# Patient Record
Sex: Female | Born: 1975 | Race: Black or African American | Hispanic: No | Smoking: Never smoker
Health system: Southern US, Community
[De-identification: ages and names within clinical notes are randomized; demographics above are authoritative.]

## PROBLEM LIST (undated history)

## (undated) ENCOUNTER — Emergency Department (HOSPITAL_BASED_OUTPATIENT_CLINIC_OR_DEPARTMENT_OTHER): Payer: No Typology Code available for payment source

## (undated) DIAGNOSIS — Z87898 Personal history of other specified conditions: Secondary | ICD-10-CM

## (undated) DIAGNOSIS — D649 Anemia, unspecified: Secondary | ICD-10-CM

## (undated) DIAGNOSIS — Q233 Congenital mitral insufficiency: Secondary | ICD-10-CM

## (undated) DIAGNOSIS — Z5189 Encounter for other specified aftercare: Secondary | ICD-10-CM

## (undated) DIAGNOSIS — J310 Chronic rhinitis: Secondary | ICD-10-CM

## (undated) DIAGNOSIS — F329 Major depressive disorder, single episode, unspecified: Secondary | ICD-10-CM

## (undated) DIAGNOSIS — R760 Raised antibody titer: Secondary | ICD-10-CM

## (undated) DIAGNOSIS — J42 Unspecified chronic bronchitis: Secondary | ICD-10-CM

## (undated) DIAGNOSIS — M20011 Mallet finger of right finger(s): Secondary | ICD-10-CM

## (undated) DIAGNOSIS — A599 Trichomoniasis, unspecified: Secondary | ICD-10-CM

## (undated) DIAGNOSIS — D573 Sickle-cell trait: Secondary | ICD-10-CM

## (undated) DIAGNOSIS — F32A Depression, unspecified: Secondary | ICD-10-CM

## (undated) DIAGNOSIS — I071 Rheumatic tricuspid insufficiency: Secondary | ICD-10-CM

## (undated) DIAGNOSIS — N920 Excessive and frequent menstruation with regular cycle: Secondary | ICD-10-CM

## (undated) DIAGNOSIS — R51 Headache: Secondary | ICD-10-CM

## (undated) DIAGNOSIS — D219 Benign neoplasm of connective and other soft tissue, unspecified: Secondary | ICD-10-CM

## (undated) DIAGNOSIS — D259 Leiomyoma of uterus, unspecified: Secondary | ICD-10-CM

## (undated) DIAGNOSIS — F419 Anxiety disorder, unspecified: Secondary | ICD-10-CM

## (undated) DIAGNOSIS — R053 Chronic cough: Secondary | ICD-10-CM

## (undated) DIAGNOSIS — M329 Systemic lupus erythematosus, unspecified: Secondary | ICD-10-CM

## (undated) DIAGNOSIS — R519 Headache, unspecified: Secondary | ICD-10-CM

## (undated) DIAGNOSIS — M47816 Spondylosis without myelopathy or radiculopathy, lumbar region: Secondary | ICD-10-CM

## (undated) DIAGNOSIS — K219 Gastro-esophageal reflux disease without esophagitis: Secondary | ICD-10-CM

## (undated) DIAGNOSIS — Z8619 Personal history of other infectious and parasitic diseases: Secondary | ICD-10-CM

## (undated) DIAGNOSIS — F5089 Other specified eating disorder: Secondary | ICD-10-CM

## (undated) DIAGNOSIS — K760 Fatty (change of) liver, not elsewhere classified: Secondary | ICD-10-CM

## (undated) DIAGNOSIS — D6862 Lupus anticoagulant syndrome: Secondary | ICD-10-CM

## (undated) DIAGNOSIS — G43109 Migraine with aura, not intractable, without status migrainosus: Secondary | ICD-10-CM

## (undated) DIAGNOSIS — K5909 Other constipation: Secondary | ICD-10-CM

## (undated) DIAGNOSIS — I340 Nonrheumatic mitral (valve) insufficiency: Secondary | ICD-10-CM

## (undated) HISTORY — PX: DILATION AND CURETTAGE OF UTERUS: SHX78

## (undated) HISTORY — DX: Fatty (change of) liver, not elsewhere classified: K76.0

## (undated) HISTORY — DX: Trichomoniasis, unspecified: A59.9

## (undated) HISTORY — DX: Spondylosis without myelopathy or radiculopathy, lumbar region: M47.816

---

## 1997-09-05 ENCOUNTER — Emergency Department (HOSPITAL_COMMUNITY): Admission: EM | Admit: 1997-09-05 | Discharge: 1997-09-05 | Payer: Self-pay | Admitting: Emergency Medicine

## 1997-10-09 ENCOUNTER — Other Ambulatory Visit: Admission: RE | Admit: 1997-10-09 | Discharge: 1997-10-09 | Payer: Self-pay | Admitting: Obstetrics and Gynecology

## 1997-12-13 ENCOUNTER — Other Ambulatory Visit: Admission: RE | Admit: 1997-12-13 | Discharge: 1997-12-13 | Payer: Self-pay | Admitting: Obstetrics and Gynecology

## 1997-12-22 ENCOUNTER — Inpatient Hospital Stay (HOSPITAL_COMMUNITY): Admission: AD | Admit: 1997-12-22 | Discharge: 1997-12-22 | Payer: Self-pay | Admitting: Obstetrics and Gynecology

## 1998-02-03 ENCOUNTER — Other Ambulatory Visit: Admission: RE | Admit: 1998-02-03 | Discharge: 1998-02-03 | Payer: Self-pay | Admitting: Obstetrics & Gynecology

## 1998-04-03 ENCOUNTER — Other Ambulatory Visit: Admission: RE | Admit: 1998-04-03 | Discharge: 1998-04-03 | Payer: Self-pay | Admitting: Obstetrics and Gynecology

## 1998-05-01 ENCOUNTER — Inpatient Hospital Stay (HOSPITAL_COMMUNITY): Admission: AD | Admit: 1998-05-01 | Discharge: 1998-05-01 | Payer: Self-pay | Admitting: Obstetrics & Gynecology

## 1998-05-14 ENCOUNTER — Inpatient Hospital Stay (HOSPITAL_COMMUNITY): Admission: AD | Admit: 1998-05-14 | Discharge: 1998-05-17 | Payer: Self-pay | Admitting: Obstetrics and Gynecology

## 1998-07-11 ENCOUNTER — Other Ambulatory Visit: Admission: RE | Admit: 1998-07-11 | Discharge: 1998-07-11 | Payer: Self-pay | Admitting: Obstetrics & Gynecology

## 1998-09-15 ENCOUNTER — Emergency Department (HOSPITAL_COMMUNITY): Admission: EM | Admit: 1998-09-15 | Discharge: 1998-09-15 | Payer: Self-pay

## 1998-10-27 ENCOUNTER — Other Ambulatory Visit: Admission: RE | Admit: 1998-10-27 | Discharge: 1998-10-27 | Payer: Self-pay | Admitting: *Deleted

## 1998-12-06 ENCOUNTER — Inpatient Hospital Stay (HOSPITAL_COMMUNITY): Admission: AD | Admit: 1998-12-06 | Discharge: 1998-12-06 | Payer: Self-pay | Admitting: Obstetrics

## 1998-12-19 ENCOUNTER — Encounter: Payer: Self-pay | Admitting: *Deleted

## 1998-12-19 ENCOUNTER — Inpatient Hospital Stay (HOSPITAL_COMMUNITY): Admission: AD | Admit: 1998-12-19 | Discharge: 1998-12-19 | Payer: Self-pay | Admitting: Obstetrics & Gynecology

## 1998-12-21 ENCOUNTER — Inpatient Hospital Stay (HOSPITAL_COMMUNITY): Admission: AD | Admit: 1998-12-21 | Discharge: 1998-12-21 | Payer: Self-pay | Admitting: Obstetrics

## 1999-01-12 ENCOUNTER — Inpatient Hospital Stay (HOSPITAL_COMMUNITY): Admission: AD | Admit: 1999-01-12 | Discharge: 1999-01-12 | Payer: Self-pay | Admitting: Obstetrics

## 1999-03-13 ENCOUNTER — Ambulatory Visit (HOSPITAL_COMMUNITY): Admission: RE | Admit: 1999-03-13 | Discharge: 1999-03-13 | Payer: Self-pay | Admitting: Obstetrics

## 1999-03-13 ENCOUNTER — Encounter: Payer: Self-pay | Admitting: *Deleted

## 1999-03-22 ENCOUNTER — Inpatient Hospital Stay (HOSPITAL_COMMUNITY): Admission: AD | Admit: 1999-03-22 | Discharge: 1999-03-22 | Payer: Self-pay | Admitting: *Deleted

## 1999-04-10 ENCOUNTER — Inpatient Hospital Stay (HOSPITAL_COMMUNITY): Admission: AD | Admit: 1999-04-10 | Discharge: 1999-04-10 | Payer: Self-pay | Admitting: *Deleted

## 1999-04-11 ENCOUNTER — Encounter: Payer: Self-pay | Admitting: *Deleted

## 1999-04-13 ENCOUNTER — Inpatient Hospital Stay (HOSPITAL_COMMUNITY): Admission: AD | Admit: 1999-04-13 | Discharge: 1999-04-13 | Payer: Self-pay | Admitting: *Deleted

## 1999-04-14 ENCOUNTER — Encounter: Payer: Self-pay | Admitting: *Deleted

## 1999-04-14 ENCOUNTER — Inpatient Hospital Stay (HOSPITAL_COMMUNITY): Admission: AD | Admit: 1999-04-14 | Discharge: 1999-04-15 | Payer: Self-pay | Admitting: *Deleted

## 1999-09-08 ENCOUNTER — Inpatient Hospital Stay (HOSPITAL_COMMUNITY): Admission: AD | Admit: 1999-09-08 | Discharge: 1999-09-08 | Payer: Self-pay | Admitting: *Deleted

## 1999-11-02 ENCOUNTER — Inpatient Hospital Stay (HOSPITAL_COMMUNITY): Admission: AD | Admit: 1999-11-02 | Discharge: 1999-11-02 | Payer: Self-pay | Admitting: Obstetrics

## 1999-11-16 ENCOUNTER — Emergency Department (HOSPITAL_COMMUNITY): Admission: EM | Admit: 1999-11-16 | Discharge: 1999-11-16 | Payer: Self-pay | Admitting: Internal Medicine

## 2000-06-01 ENCOUNTER — Emergency Department (HOSPITAL_COMMUNITY): Admission: EM | Admit: 2000-06-01 | Discharge: 2000-06-01 | Payer: Self-pay | Admitting: Emergency Medicine

## 2000-06-02 ENCOUNTER — Emergency Department (HOSPITAL_COMMUNITY): Admission: EM | Admit: 2000-06-02 | Discharge: 2000-06-02 | Payer: Self-pay | Admitting: Emergency Medicine

## 2000-08-10 ENCOUNTER — Emergency Department (HOSPITAL_COMMUNITY): Admission: EM | Admit: 2000-08-10 | Discharge: 2000-08-10 | Payer: Self-pay | Admitting: Emergency Medicine

## 2000-09-13 ENCOUNTER — Other Ambulatory Visit: Admission: RE | Admit: 2000-09-13 | Discharge: 2000-09-13 | Payer: Self-pay | Admitting: Obstetrics and Gynecology

## 2000-10-30 ENCOUNTER — Inpatient Hospital Stay (HOSPITAL_COMMUNITY): Admission: AD | Admit: 2000-10-30 | Discharge: 2000-10-30 | Payer: Self-pay | Admitting: Obstetrics and Gynecology

## 2000-11-11 ENCOUNTER — Inpatient Hospital Stay (HOSPITAL_COMMUNITY): Admission: AD | Admit: 2000-11-11 | Discharge: 2000-11-11 | Payer: Self-pay | Admitting: Obstetrics and Gynecology

## 2000-11-28 ENCOUNTER — Encounter: Payer: Self-pay | Admitting: Obstetrics and Gynecology

## 2000-11-28 ENCOUNTER — Ambulatory Visit (HOSPITAL_COMMUNITY): Admission: RE | Admit: 2000-11-28 | Discharge: 2000-11-28 | Payer: Self-pay | Admitting: Obstetrics and Gynecology

## 2001-02-01 ENCOUNTER — Encounter: Payer: Self-pay | Admitting: Obstetrics and Gynecology

## 2001-02-01 ENCOUNTER — Ambulatory Visit (HOSPITAL_COMMUNITY): Admission: RE | Admit: 2001-02-01 | Discharge: 2001-02-01 | Payer: Self-pay | Admitting: Obstetrics and Gynecology

## 2001-02-28 ENCOUNTER — Inpatient Hospital Stay (HOSPITAL_COMMUNITY): Admission: AD | Admit: 2001-02-28 | Discharge: 2001-02-28 | Payer: Self-pay | Admitting: Obstetrics and Gynecology

## 2001-03-07 ENCOUNTER — Ambulatory Visit (HOSPITAL_COMMUNITY): Admission: RE | Admit: 2001-03-07 | Discharge: 2001-03-07 | Payer: Self-pay | Admitting: Obstetrics and Gynecology

## 2001-03-07 ENCOUNTER — Encounter: Payer: Self-pay | Admitting: Obstetrics and Gynecology

## 2001-03-16 ENCOUNTER — Encounter: Admission: RE | Admit: 2001-03-16 | Discharge: 2001-03-16 | Payer: Self-pay | Admitting: Obstetrics and Gynecology

## 2001-03-21 ENCOUNTER — Inpatient Hospital Stay (HOSPITAL_COMMUNITY): Admission: AD | Admit: 2001-03-21 | Discharge: 2001-03-21 | Payer: Self-pay | Admitting: Obstetrics and Gynecology

## 2001-03-21 ENCOUNTER — Encounter: Payer: Self-pay | Admitting: Obstetrics and Gynecology

## 2001-04-09 ENCOUNTER — Inpatient Hospital Stay (HOSPITAL_COMMUNITY): Admission: AD | Admit: 2001-04-09 | Discharge: 2001-04-09 | Payer: Self-pay | Admitting: Obstetrics and Gynecology

## 2001-04-25 ENCOUNTER — Inpatient Hospital Stay (HOSPITAL_COMMUNITY): Admission: AD | Admit: 2001-04-25 | Discharge: 2001-04-28 | Payer: Self-pay | Admitting: Obstetrics and Gynecology

## 2002-01-12 ENCOUNTER — Inpatient Hospital Stay (HOSPITAL_COMMUNITY): Admission: AD | Admit: 2002-01-12 | Discharge: 2002-01-12 | Payer: Self-pay | Admitting: Obstetrics and Gynecology

## 2002-01-12 ENCOUNTER — Encounter: Payer: Self-pay | Admitting: Obstetrics and Gynecology

## 2002-01-25 ENCOUNTER — Other Ambulatory Visit: Admission: RE | Admit: 2002-01-25 | Discharge: 2002-01-25 | Payer: Self-pay | Admitting: Obstetrics and Gynecology

## 2002-03-08 ENCOUNTER — Ambulatory Visit (HOSPITAL_COMMUNITY): Admission: RE | Admit: 2002-03-08 | Discharge: 2002-03-08 | Payer: Self-pay | Admitting: Obstetrics and Gynecology

## 2002-03-08 ENCOUNTER — Encounter: Payer: Self-pay | Admitting: Obstetrics and Gynecology

## 2002-03-09 ENCOUNTER — Encounter: Payer: Self-pay | Admitting: Obstetrics and Gynecology

## 2002-03-09 ENCOUNTER — Ambulatory Visit (HOSPITAL_COMMUNITY): Admission: RE | Admit: 2002-03-09 | Discharge: 2002-03-09 | Payer: Self-pay | Admitting: Obstetrics and Gynecology

## 2002-03-10 ENCOUNTER — Encounter (INDEPENDENT_AMBULATORY_CARE_PROVIDER_SITE_OTHER): Payer: Self-pay | Admitting: Specialist

## 2003-04-26 ENCOUNTER — Emergency Department (HOSPITAL_COMMUNITY): Admission: EM | Admit: 2003-04-26 | Discharge: 2003-04-26 | Payer: Self-pay | Admitting: Emergency Medicine

## 2003-08-17 ENCOUNTER — Emergency Department (HOSPITAL_COMMUNITY): Admission: EM | Admit: 2003-08-17 | Discharge: 2003-08-17 | Payer: Self-pay | Admitting: Emergency Medicine

## 2003-09-17 ENCOUNTER — Inpatient Hospital Stay (HOSPITAL_COMMUNITY): Admission: AD | Admit: 2003-09-17 | Discharge: 2003-09-17 | Payer: Self-pay | Admitting: *Deleted

## 2003-10-01 ENCOUNTER — Ambulatory Visit (HOSPITAL_COMMUNITY): Admission: RE | Admit: 2003-10-01 | Discharge: 2003-10-01 | Payer: Self-pay | Admitting: *Deleted

## 2003-10-23 ENCOUNTER — Inpatient Hospital Stay (HOSPITAL_COMMUNITY): Admission: AD | Admit: 2003-10-23 | Discharge: 2003-10-23 | Payer: Self-pay | Admitting: Obstetrics and Gynecology

## 2003-12-08 ENCOUNTER — Inpatient Hospital Stay (HOSPITAL_COMMUNITY): Admission: AD | Admit: 2003-12-08 | Discharge: 2003-12-08 | Payer: Self-pay | Admitting: Gynecology

## 2003-12-19 ENCOUNTER — Inpatient Hospital Stay (HOSPITAL_COMMUNITY): Admission: AD | Admit: 2003-12-19 | Discharge: 2003-12-20 | Payer: Self-pay | Admitting: Obstetrics

## 2004-03-09 ENCOUNTER — Inpatient Hospital Stay (HOSPITAL_COMMUNITY): Admission: AD | Admit: 2004-03-09 | Discharge: 2004-03-09 | Payer: Self-pay | Admitting: Obstetrics

## 2004-03-12 ENCOUNTER — Ambulatory Visit (HOSPITAL_COMMUNITY): Admission: RE | Admit: 2004-03-12 | Discharge: 2004-03-12 | Payer: Self-pay | Admitting: Family Medicine

## 2004-03-22 ENCOUNTER — Inpatient Hospital Stay (HOSPITAL_COMMUNITY): Admission: AD | Admit: 2004-03-22 | Discharge: 2004-03-22 | Payer: Self-pay | Admitting: Obstetrics

## 2004-05-25 ENCOUNTER — Ambulatory Visit (HOSPITAL_COMMUNITY): Admission: RE | Admit: 2004-05-25 | Discharge: 2004-05-25 | Payer: Self-pay | Admitting: Obstetrics & Gynecology

## 2004-05-28 ENCOUNTER — Inpatient Hospital Stay (HOSPITAL_COMMUNITY): Admission: AD | Admit: 2004-05-28 | Discharge: 2004-05-31 | Payer: Self-pay | Admitting: Obstetrics

## 2004-07-09 ENCOUNTER — Encounter (INDEPENDENT_AMBULATORY_CARE_PROVIDER_SITE_OTHER): Payer: Self-pay | Admitting: *Deleted

## 2004-07-09 ENCOUNTER — Observation Stay (HOSPITAL_COMMUNITY): Admission: AD | Admit: 2004-07-09 | Discharge: 2004-07-10 | Payer: Self-pay | Admitting: Obstetrics & Gynecology

## 2004-12-15 ENCOUNTER — Emergency Department (HOSPITAL_COMMUNITY): Admission: EM | Admit: 2004-12-15 | Discharge: 2004-12-15 | Payer: Self-pay | Admitting: Emergency Medicine

## 2005-04-28 ENCOUNTER — Inpatient Hospital Stay (HOSPITAL_COMMUNITY): Admission: AD | Admit: 2005-04-28 | Discharge: 2005-04-28 | Payer: Self-pay | Admitting: Obstetrics and Gynecology

## 2005-05-03 ENCOUNTER — Inpatient Hospital Stay (HOSPITAL_COMMUNITY): Admission: AD | Admit: 2005-05-03 | Discharge: 2005-05-03 | Payer: Self-pay | Admitting: Obstetrics and Gynecology

## 2005-05-13 ENCOUNTER — Inpatient Hospital Stay (HOSPITAL_COMMUNITY): Admission: AD | Admit: 2005-05-13 | Discharge: 2005-05-13 | Payer: Self-pay | Admitting: Obstetrics and Gynecology

## 2005-05-17 ENCOUNTER — Encounter (INDEPENDENT_AMBULATORY_CARE_PROVIDER_SITE_OTHER): Payer: Self-pay | Admitting: *Deleted

## 2005-05-17 ENCOUNTER — Ambulatory Visit: Admission: AD | Admit: 2005-05-17 | Discharge: 2005-05-17 | Payer: Self-pay | Admitting: Family Medicine

## 2005-06-07 ENCOUNTER — Emergency Department (HOSPITAL_COMMUNITY): Admission: EM | Admit: 2005-06-07 | Discharge: 2005-06-07 | Payer: Self-pay | Admitting: Emergency Medicine

## 2005-06-15 ENCOUNTER — Emergency Department (HOSPITAL_COMMUNITY): Admission: EM | Admit: 2005-06-15 | Discharge: 2005-06-15 | Payer: Self-pay | Admitting: *Deleted

## 2005-10-21 ENCOUNTER — Inpatient Hospital Stay (HOSPITAL_COMMUNITY): Admission: AD | Admit: 2005-10-21 | Discharge: 2005-10-21 | Payer: Self-pay | Admitting: Family Medicine

## 2005-11-29 ENCOUNTER — Ambulatory Visit: Payer: Self-pay | Admitting: Obstetrics & Gynecology

## 2005-11-29 ENCOUNTER — Inpatient Hospital Stay (HOSPITAL_COMMUNITY): Admission: AD | Admit: 2005-11-29 | Discharge: 2005-11-29 | Payer: Self-pay | Admitting: Obstetrics & Gynecology

## 2005-12-16 ENCOUNTER — Inpatient Hospital Stay (HOSPITAL_COMMUNITY): Admission: AD | Admit: 2005-12-16 | Discharge: 2005-12-17 | Payer: Self-pay | Admitting: Family Medicine

## 2005-12-16 ENCOUNTER — Ambulatory Visit: Payer: Self-pay | Admitting: Certified Nurse Midwife

## 2005-12-20 ENCOUNTER — Ambulatory Visit: Payer: Self-pay | Admitting: *Deleted

## 2005-12-20 ENCOUNTER — Encounter (INDEPENDENT_AMBULATORY_CARE_PROVIDER_SITE_OTHER): Payer: Self-pay | Admitting: *Deleted

## 2005-12-24 ENCOUNTER — Ambulatory Visit (HOSPITAL_COMMUNITY): Admission: RE | Admit: 2005-12-24 | Discharge: 2005-12-24 | Payer: Self-pay | Admitting: Family Medicine

## 2005-12-24 ENCOUNTER — Encounter (INDEPENDENT_AMBULATORY_CARE_PROVIDER_SITE_OTHER): Payer: Self-pay | Admitting: *Deleted

## 2006-01-02 ENCOUNTER — Emergency Department (HOSPITAL_COMMUNITY): Admission: EM | Admit: 2006-01-02 | Discharge: 2006-01-02 | Payer: Self-pay | Admitting: Emergency Medicine

## 2006-01-06 ENCOUNTER — Ambulatory Visit: Payer: Self-pay | Admitting: Family Medicine

## 2006-01-20 ENCOUNTER — Ambulatory Visit: Payer: Self-pay | Admitting: Family Medicine

## 2006-01-24 ENCOUNTER — Ambulatory Visit: Payer: Self-pay | Admitting: Obstetrics & Gynecology

## 2006-01-27 ENCOUNTER — Ambulatory Visit: Payer: Self-pay | Admitting: Family Medicine

## 2006-02-07 ENCOUNTER — Ambulatory Visit: Payer: Self-pay | Admitting: Obstetrics & Gynecology

## 2006-02-10 ENCOUNTER — Ambulatory Visit: Payer: Self-pay | Admitting: Family Medicine

## 2006-02-14 ENCOUNTER — Ambulatory Visit: Payer: Self-pay | Admitting: Obstetrics & Gynecology

## 2006-02-16 ENCOUNTER — Ambulatory Visit: Payer: Self-pay | Admitting: Obstetrics and Gynecology

## 2006-02-16 ENCOUNTER — Inpatient Hospital Stay (HOSPITAL_COMMUNITY): Admission: AD | Admit: 2006-02-16 | Discharge: 2006-02-19 | Payer: Self-pay | Admitting: Gynecology

## 2006-03-21 ENCOUNTER — Inpatient Hospital Stay (HOSPITAL_COMMUNITY): Admission: AD | Admit: 2006-03-21 | Discharge: 2006-03-22 | Payer: Self-pay | Admitting: Obstetrics & Gynecology

## 2006-03-21 ENCOUNTER — Ambulatory Visit: Payer: Self-pay | Admitting: Obstetrics and Gynecology

## 2006-05-13 ENCOUNTER — Inpatient Hospital Stay (HOSPITAL_COMMUNITY): Admission: AD | Admit: 2006-05-13 | Discharge: 2006-05-13 | Payer: Self-pay | Admitting: Family Medicine

## 2006-10-26 ENCOUNTER — Emergency Department (HOSPITAL_COMMUNITY): Admission: EM | Admit: 2006-10-26 | Discharge: 2006-10-26 | Payer: Self-pay | Admitting: Emergency Medicine

## 2007-02-11 ENCOUNTER — Inpatient Hospital Stay (HOSPITAL_COMMUNITY): Admission: AD | Admit: 2007-02-11 | Discharge: 2007-02-11 | Payer: Self-pay | Admitting: Obstetrics and Gynecology

## 2007-02-15 ENCOUNTER — Inpatient Hospital Stay (HOSPITAL_COMMUNITY): Admission: AD | Admit: 2007-02-15 | Discharge: 2007-02-15 | Payer: Self-pay | Admitting: Family Medicine

## 2007-03-11 ENCOUNTER — Inpatient Hospital Stay (HOSPITAL_COMMUNITY): Admission: AD | Admit: 2007-03-11 | Discharge: 2007-03-11 | Payer: Self-pay | Admitting: Gynecology

## 2007-03-29 ENCOUNTER — Inpatient Hospital Stay (HOSPITAL_COMMUNITY): Admission: AD | Admit: 2007-03-29 | Discharge: 2007-03-29 | Payer: Self-pay | Admitting: Obstetrics & Gynecology

## 2007-04-12 ENCOUNTER — Ambulatory Visit: Payer: Self-pay | Admitting: *Deleted

## 2007-04-12 ENCOUNTER — Encounter (INDEPENDENT_AMBULATORY_CARE_PROVIDER_SITE_OTHER): Payer: Self-pay | Admitting: *Deleted

## 2007-04-25 ENCOUNTER — Inpatient Hospital Stay (HOSPITAL_COMMUNITY): Admission: AD | Admit: 2007-04-25 | Discharge: 2007-04-25 | Payer: Self-pay | Admitting: Obstetrics & Gynecology

## 2007-04-26 ENCOUNTER — Ambulatory Visit (HOSPITAL_COMMUNITY): Admission: RE | Admit: 2007-04-26 | Discharge: 2007-04-26 | Payer: Self-pay | Admitting: Obstetrics & Gynecology

## 2007-04-30 ENCOUNTER — Inpatient Hospital Stay (HOSPITAL_COMMUNITY): Admission: AD | Admit: 2007-04-30 | Discharge: 2007-04-30 | Payer: Self-pay | Admitting: Gynecology

## 2007-05-04 ENCOUNTER — Ambulatory Visit: Payer: Self-pay | Admitting: Obstetrics & Gynecology

## 2007-05-05 ENCOUNTER — Inpatient Hospital Stay (HOSPITAL_COMMUNITY): Admission: AD | Admit: 2007-05-05 | Discharge: 2007-05-06 | Payer: Self-pay | Admitting: Gynecology

## 2007-05-07 ENCOUNTER — Ambulatory Visit: Payer: Self-pay | Admitting: Obstetrics and Gynecology

## 2007-05-07 ENCOUNTER — Inpatient Hospital Stay (HOSPITAL_COMMUNITY): Admission: AD | Admit: 2007-05-07 | Discharge: 2007-05-07 | Payer: Self-pay | Admitting: Gynecology

## 2007-05-08 ENCOUNTER — Ambulatory Visit (HOSPITAL_COMMUNITY): Admission: RE | Admit: 2007-05-08 | Discharge: 2007-05-08 | Payer: Self-pay | Admitting: Obstetrics & Gynecology

## 2007-05-15 ENCOUNTER — Inpatient Hospital Stay (HOSPITAL_COMMUNITY): Admission: AD | Admit: 2007-05-15 | Discharge: 2007-05-16 | Payer: Self-pay | Admitting: Obstetrics and Gynecology

## 2007-05-18 ENCOUNTER — Ambulatory Visit: Payer: Self-pay | Admitting: Family Medicine

## 2007-05-22 ENCOUNTER — Ambulatory Visit (HOSPITAL_COMMUNITY): Admission: RE | Admit: 2007-05-22 | Discharge: 2007-05-22 | Payer: Self-pay | Admitting: Obstetrics & Gynecology

## 2007-06-01 ENCOUNTER — Ambulatory Visit: Payer: Self-pay | Admitting: Family Medicine

## 2007-06-15 ENCOUNTER — Other Ambulatory Visit: Payer: Self-pay | Admitting: Family Medicine

## 2007-06-15 ENCOUNTER — Ambulatory Visit: Payer: Self-pay | Admitting: Family Medicine

## 2007-06-20 ENCOUNTER — Ambulatory Visit (HOSPITAL_COMMUNITY): Admission: RE | Admit: 2007-06-20 | Discharge: 2007-06-20 | Payer: Self-pay | Admitting: Obstetrics & Gynecology

## 2007-06-22 ENCOUNTER — Inpatient Hospital Stay (HOSPITAL_COMMUNITY): Admission: AD | Admit: 2007-06-22 | Discharge: 2007-06-22 | Payer: Self-pay | Admitting: Family Medicine

## 2007-06-22 ENCOUNTER — Ambulatory Visit: Payer: Self-pay | Admitting: Advanced Practice Midwife

## 2007-07-06 ENCOUNTER — Ambulatory Visit: Payer: Self-pay | Admitting: Obstetrics & Gynecology

## 2007-07-18 ENCOUNTER — Ambulatory Visit (HOSPITAL_COMMUNITY): Admission: RE | Admit: 2007-07-18 | Discharge: 2007-07-18 | Payer: Self-pay | Admitting: Obstetrics & Gynecology

## 2007-07-20 ENCOUNTER — Ambulatory Visit: Payer: Self-pay | Admitting: Family Medicine

## 2007-08-03 ENCOUNTER — Ambulatory Visit: Payer: Self-pay | Admitting: Family Medicine

## 2007-08-08 ENCOUNTER — Ambulatory Visit: Payer: Self-pay | Admitting: Obstetrics and Gynecology

## 2007-08-08 ENCOUNTER — Observation Stay (HOSPITAL_COMMUNITY): Admission: AD | Admit: 2007-08-08 | Discharge: 2007-08-09 | Payer: Self-pay | Admitting: Obstetrics & Gynecology

## 2007-08-10 ENCOUNTER — Ambulatory Visit: Payer: Self-pay | Admitting: Obstetrics & Gynecology

## 2007-08-14 ENCOUNTER — Ambulatory Visit: Payer: Self-pay | Admitting: Obstetrics & Gynecology

## 2007-08-15 ENCOUNTER — Ambulatory Visit (HOSPITAL_COMMUNITY): Admission: RE | Admit: 2007-08-15 | Discharge: 2007-08-15 | Payer: Self-pay | Admitting: Obstetrics & Gynecology

## 2007-08-17 ENCOUNTER — Ambulatory Visit: Payer: Self-pay | Admitting: Obstetrics & Gynecology

## 2007-08-17 ENCOUNTER — Inpatient Hospital Stay (HOSPITAL_COMMUNITY): Admission: RE | Admit: 2007-08-17 | Discharge: 2007-08-21 | Payer: Self-pay | Admitting: Family Medicine

## 2007-08-17 ENCOUNTER — Ambulatory Visit (HOSPITAL_COMMUNITY): Admission: RE | Admit: 2007-08-17 | Discharge: 2007-08-17 | Payer: Self-pay | Admitting: Obstetrics & Gynecology

## 2007-08-17 ENCOUNTER — Encounter: Payer: Self-pay | Admitting: Obstetrics & Gynecology

## 2007-08-25 ENCOUNTER — Ambulatory Visit: Payer: Self-pay | Admitting: Family Medicine

## 2007-08-25 ENCOUNTER — Telehealth (INDEPENDENT_AMBULATORY_CARE_PROVIDER_SITE_OTHER): Payer: Self-pay | Admitting: *Deleted

## 2007-08-25 DIAGNOSIS — R760 Raised antibody titer: Secondary | ICD-10-CM

## 2007-08-25 LAB — CONVERTED CEMR LAB

## 2007-08-28 ENCOUNTER — Encounter (INDEPENDENT_AMBULATORY_CARE_PROVIDER_SITE_OTHER): Payer: Self-pay | Admitting: Family Medicine

## 2007-08-28 ENCOUNTER — Ambulatory Visit: Payer: Self-pay | Admitting: Sports Medicine

## 2007-08-28 DIAGNOSIS — L089 Local infection of the skin and subcutaneous tissue, unspecified: Secondary | ICD-10-CM | POA: Insufficient documentation

## 2007-08-30 ENCOUNTER — Ambulatory Visit: Payer: Self-pay | Admitting: Family Medicine

## 2007-09-01 ENCOUNTER — Ambulatory Visit: Payer: Self-pay | Admitting: Family Medicine

## 2007-09-01 LAB — CONVERTED CEMR LAB: INR: 2.7

## 2007-09-07 ENCOUNTER — Ambulatory Visit: Payer: Self-pay | Admitting: Family Medicine

## 2007-09-21 ENCOUNTER — Ambulatory Visit: Payer: Self-pay | Admitting: Family Medicine

## 2007-09-21 ENCOUNTER — Encounter: Payer: Self-pay | Admitting: *Deleted

## 2007-09-21 DIAGNOSIS — L980 Pyogenic granuloma: Secondary | ICD-10-CM

## 2007-10-02 ENCOUNTER — Ambulatory Visit: Payer: Self-pay | Admitting: Family Medicine

## 2007-10-05 ENCOUNTER — Encounter: Payer: Self-pay | Admitting: Family Medicine

## 2007-10-13 ENCOUNTER — Encounter: Payer: Self-pay | Admitting: Family Medicine

## 2007-10-22 ENCOUNTER — Emergency Department (HOSPITAL_COMMUNITY): Admission: EM | Admit: 2007-10-22 | Discharge: 2007-10-22 | Payer: Self-pay | Admitting: Emergency Medicine

## 2008-04-13 ENCOUNTER — Encounter: Payer: Self-pay | Admitting: Family Medicine

## 2008-08-08 ENCOUNTER — Inpatient Hospital Stay (HOSPITAL_COMMUNITY): Admission: AD | Admit: 2008-08-08 | Discharge: 2008-08-08 | Payer: Self-pay | Admitting: Obstetrics & Gynecology

## 2008-12-24 ENCOUNTER — Emergency Department (HOSPITAL_COMMUNITY): Admission: EM | Admit: 2008-12-24 | Discharge: 2008-12-24 | Payer: Self-pay | Admitting: Emergency Medicine

## 2009-01-28 ENCOUNTER — Ambulatory Visit: Payer: Self-pay | Admitting: Family Medicine

## 2009-04-21 ENCOUNTER — Emergency Department (HOSPITAL_COMMUNITY): Admission: EM | Admit: 2009-04-21 | Discharge: 2009-04-22 | Payer: Self-pay | Admitting: Emergency Medicine

## 2009-08-25 ENCOUNTER — Ambulatory Visit (HOSPITAL_COMMUNITY): Admission: RE | Admit: 2009-08-25 | Discharge: 2009-08-25 | Payer: Self-pay | Admitting: Family Medicine

## 2009-08-25 ENCOUNTER — Ambulatory Visit: Payer: Self-pay | Admitting: Family Medicine

## 2009-08-25 DIAGNOSIS — R079 Chest pain, unspecified: Secondary | ICD-10-CM

## 2009-08-26 ENCOUNTER — Encounter: Payer: Self-pay | Admitting: *Deleted

## 2009-08-29 ENCOUNTER — Encounter: Payer: Self-pay | Admitting: Family Medicine

## 2009-09-01 ENCOUNTER — Encounter: Payer: Self-pay | Admitting: Family Medicine

## 2009-09-01 ENCOUNTER — Ambulatory Visit: Payer: Self-pay | Admitting: Family Medicine

## 2009-09-03 ENCOUNTER — Encounter: Payer: Self-pay | Admitting: *Deleted

## 2009-10-15 ENCOUNTER — Inpatient Hospital Stay (HOSPITAL_COMMUNITY): Admission: AD | Admit: 2009-10-15 | Discharge: 2009-10-15 | Payer: Self-pay | Admitting: Obstetrics & Gynecology

## 2009-10-17 ENCOUNTER — Ambulatory Visit: Payer: Self-pay | Admitting: Obstetrics and Gynecology

## 2009-10-17 ENCOUNTER — Inpatient Hospital Stay (HOSPITAL_COMMUNITY): Admission: AD | Admit: 2009-10-17 | Discharge: 2009-10-17 | Payer: Self-pay | Admitting: Obstetrics & Gynecology

## 2009-11-27 ENCOUNTER — Emergency Department (HOSPITAL_COMMUNITY): Admission: EM | Admit: 2009-11-27 | Discharge: 2009-11-27 | Payer: Self-pay | Admitting: Emergency Medicine

## 2010-02-16 ENCOUNTER — Telehealth: Payer: Self-pay | Admitting: *Deleted

## 2010-02-16 ENCOUNTER — Encounter: Payer: Self-pay | Admitting: *Deleted

## 2010-02-23 ENCOUNTER — Encounter: Payer: Self-pay | Admitting: Family Medicine

## 2010-02-27 ENCOUNTER — Encounter: Payer: Self-pay | Admitting: Family Medicine

## 2010-02-27 DIAGNOSIS — I071 Rheumatic tricuspid insufficiency: Secondary | ICD-10-CM

## 2010-02-27 DIAGNOSIS — Q233 Congenital mitral insufficiency: Secondary | ICD-10-CM

## 2010-02-27 HISTORY — DX: Rheumatic tricuspid insufficiency: I07.1

## 2010-02-27 HISTORY — DX: Congenital mitral insufficiency: Q23.3

## 2010-03-26 DIAGNOSIS — D219 Benign neoplasm of connective and other soft tissue, unspecified: Secondary | ICD-10-CM

## 2010-03-26 HISTORY — DX: Benign neoplasm of connective and other soft tissue, unspecified: D21.9

## 2010-04-03 ENCOUNTER — Inpatient Hospital Stay (HOSPITAL_COMMUNITY)
Admission: AD | Admit: 2010-04-03 | Discharge: 2010-04-03 | Payer: Self-pay | Source: Home / Self Care | Attending: Obstetrics & Gynecology | Admitting: Obstetrics & Gynecology

## 2010-04-06 ENCOUNTER — Encounter (INDEPENDENT_AMBULATORY_CARE_PROVIDER_SITE_OTHER): Payer: Self-pay | Admitting: Cardiology

## 2010-04-06 ENCOUNTER — Ambulatory Visit (HOSPITAL_COMMUNITY)
Admission: RE | Admit: 2010-04-06 | Discharge: 2010-04-06 | Payer: Self-pay | Source: Home / Self Care | Attending: Cardiology | Admitting: Cardiology

## 2010-04-15 ENCOUNTER — Ambulatory Visit: Payer: Self-pay | Admitting: Oncology

## 2010-04-23 ENCOUNTER — Ambulatory Visit: Payer: Self-pay | Admitting: Obstetrics and Gynecology

## 2010-04-28 LAB — CBC WITH DIFFERENTIAL/PLATELET
Basophils Absolute: 0 10*3/uL (ref 0.0–0.1)
Eosinophils Absolute: 0.1 10*3/uL (ref 0.0–0.5)
HCT: 31.9 % — ABNORMAL LOW (ref 34.8–46.6)
LYMPH%: 25.6 % (ref 14.0–49.7)
MCH: 24.4 pg — ABNORMAL LOW (ref 25.1–34.0)
MONO#: 0.4 10*3/uL (ref 0.1–0.9)
MONO%: 6.5 % (ref 0.0–14.0)
NEUT%: 66.8 % (ref 38.4–76.8)
Platelets: 278 10*3/uL (ref 145–400)
lymph#: 1.6 10*3/uL (ref 0.9–3.3)

## 2010-04-30 ENCOUNTER — Encounter: Payer: Self-pay | Admitting: Family Medicine

## 2010-04-30 ENCOUNTER — Ambulatory Visit
Admission: RE | Admit: 2010-04-30 | Discharge: 2010-04-30 | Payer: Self-pay | Source: Home / Self Care | Attending: Obstetrics and Gynecology | Admitting: Obstetrics and Gynecology

## 2010-04-30 LAB — POCT URINALYSIS DIPSTICK
Bilirubin Urine: NEGATIVE
Hemoglobin, Urine: NEGATIVE
Ketones, ur: NEGATIVE mg/dL
Nitrite: NEGATIVE
Protein, ur: NEGATIVE mg/dL
Specific Gravity, Urine: 1.025 (ref 1.005–1.030)
Urine Glucose, Fasting: NEGATIVE mg/dL
Urobilinogen, UA: 0.2 mg/dL (ref 0.0–1.0)
pH: 5.5 (ref 5.0–8.0)

## 2010-05-04 LAB — FERRITIN: Ferritin: 9 ng/mL — ABNORMAL LOW (ref 10–291)

## 2010-05-04 LAB — HEMOGLOBINOPATHY EVALUATION
Hemoglobin Other: 31.4 % — ABNORMAL HIGH (ref 0.0–0.0)
Hgb A2 Quant: 1.2 % — ABNORMAL LOW (ref 2.2–3.2)
Hgb A: 67.4 % — ABNORMAL LOW (ref 96.8–97.8)
Hgb F Quant: 0 % (ref 0.0–2.0)
Hgb S Quant: 0 % (ref 0.0–0.0)

## 2010-05-04 LAB — COMPREHENSIVE METABOLIC PANEL
ALT: 13 U/L (ref 0–35)
AST: 18 U/L (ref 0–37)
Albumin: 4.2 g/dL (ref 3.5–5.2)
Alkaline Phosphatase: 58 U/L (ref 39–117)
BUN: 10 mg/dL (ref 6–23)
CO2: 21 mEq/L (ref 19–32)
Calcium: 9.7 mg/dL (ref 8.4–10.5)
Chloride: 105 mEq/L (ref 96–112)
Creatinine, Ser: 0.81 mg/dL (ref 0.40–1.20)
Glucose, Bld: 90 mg/dL (ref 70–99)
Potassium: 4.1 mEq/L (ref 3.5–5.3)
Sodium: 136 mEq/L (ref 135–145)
Total Bilirubin: 0.4 mg/dL (ref 0.3–1.2)
Total Protein: 7.2 g/dL (ref 6.0–8.3)

## 2010-05-04 LAB — IRON AND TIBC
%SAT: 13 % — ABNORMAL LOW (ref 20–55)
Iron: 49 ug/dL (ref 42–145)
TIBC: 379 ug/dL (ref 250–470)
UIBC: 330 ug/dL

## 2010-05-04 LAB — HGB ELECTROPHORESIS REFLEXED REPORT
Hemoglobin A - HGBRFX: 61.5 % — ABNORMAL LOW (ref >96.0)
Hemoglobin A2 - HGBRFX: 2.6 % (ref 1.8–3.5)
Hemoglobin Elect C: 35.9 % — ABNORMAL HIGH
Hemoglobin F - HGBRFX: 0 % (ref <2.0)
Sickle Solubility Test - HGBRFX: NEGATIVE

## 2010-05-07 ENCOUNTER — Ambulatory Visit (HOSPITAL_COMMUNITY)
Admission: RE | Admit: 2010-05-07 | Discharge: 2010-05-07 | Payer: Self-pay | Source: Home / Self Care | Attending: Obstetrics and Gynecology | Admitting: Obstetrics and Gynecology

## 2010-05-13 ENCOUNTER — Telehealth: Payer: Self-pay | Admitting: *Deleted

## 2010-05-14 ENCOUNTER — Ambulatory Visit
Admission: RE | Admit: 2010-05-14 | Discharge: 2010-05-14 | Payer: Self-pay | Source: Home / Self Care | Attending: Family Medicine | Admitting: Family Medicine

## 2010-05-14 ENCOUNTER — Encounter: Payer: Self-pay | Admitting: Obstetrics & Gynecology

## 2010-05-14 LAB — CONVERTED CEMR LAB
Chlamydia, DNA Probe: NEGATIVE
GC Probe Amp, Genital: NEGATIVE

## 2010-05-15 ENCOUNTER — Encounter: Payer: Self-pay | Admitting: Obstetrics & Gynecology

## 2010-05-15 LAB — CONVERTED CEMR LAB

## 2010-05-16 ENCOUNTER — Other Ambulatory Visit: Payer: Self-pay | Admitting: Obstetrics and Gynecology

## 2010-05-16 ENCOUNTER — Encounter: Payer: Self-pay | Admitting: Obstetrics & Gynecology

## 2010-05-16 DIAGNOSIS — Z3689 Encounter for other specified antenatal screening: Secondary | ICD-10-CM

## 2010-05-17 ENCOUNTER — Encounter: Payer: Self-pay | Admitting: Obstetrics & Gynecology

## 2010-05-17 ENCOUNTER — Encounter: Payer: Self-pay | Admitting: *Deleted

## 2010-05-18 ENCOUNTER — Ambulatory Visit (HOSPITAL_COMMUNITY)
Admission: RE | Admit: 2010-05-18 | Discharge: 2010-05-18 | Payer: Self-pay | Source: Home / Self Care | Attending: Obstetrics and Gynecology | Admitting: Obstetrics and Gynecology

## 2010-05-18 LAB — POCT URINALYSIS DIPSTICK
Nitrite: NEGATIVE
Specific Gravity, Urine: >=1.03 (ref 1.005–1.030)
Urine Glucose, Fasting: NEGATIVE mg/dL
Urobilinogen, UA: 0.2 mg/dL (ref 0.0–1.0)
pH: 5.5 (ref 5.0–8.0)

## 2010-05-26 NOTE — Letter (Signed)
Summary: Generic Letter  [facility]  [address]   [place], [place] [address]   Phone: [phone]  Fax: [phone]    02/16/2010  Latoya Guzman [address] [place], [place]  [address]  Dear Ms. Latoya Guzman,  I have made an appointment for you to be seen at [facility] on 02/23/2010 at 12:00pm.  There address is [address] [place]. [address] ph. [phone].  If you are unable to keep this appointment please call their office 48 hours in advance to cancel/reschedule so that you will not incur a fee. Thank you for your time and attention to this matter.   Sincerely,   Loralee Pacas CMA

## 2010-05-26 NOTE — Miscellaneous (Signed)
Summary: Consent Implanon Removal  Consent Implanon Removal   Imported By: Clydell Hakim 09/03/2009 11:18:22  _____________________________________________________________________  External Attachment:    Type:   Image     Comment:   External Document

## 2010-05-26 NOTE — Assessment & Plan Note (Signed)
Summary: K[facility]   Vital Signs:  Patient profile:   35 year old female Height:      71 inches Weight:      231.3 pounds BMI:     32.38 Temp:     98.5 degrees F oral Pulse rate:   78 / minute BP sitting:   110 / 71  (left arm) Cuff size:   large  Vitals Entered By: Gladstone Pih (Aug 25, 2009 9:27 AM) CC: F/U, C/o of chest pain no and off since end of Mar, exp some this AM Is Patient Diabetic? No Pain Assessment Patient in pain? yes     Location: chest Onset of pain  chest pain on and off since end of March   CC:  F/U, C/o of chest pain no and off since end of Mar, and exp some this AM.  History of Present Illness: 1.  chest pain--off and on for years, but worse/more frequent past few years.  recently went to the dentist to discuss getting wisdom teeth removed.  spoke with dentist about the chest pain and now he requests cardiology referral.  has had 4 episodes of cp in last 6 months.  after one episode went to ER  (8/10) because she had a syncopal episode during the pain.  pain is substernal, squeezing and sometimes sharp, lasts about 2 hours.  not exertional.  no relieving factors.  Associated symptoms:  sob, diphoresis, sharp pain in right arm.  not associated with nausea.    reviewed ER note--normal labs (including cardiac enzymes and d-dimer), normal cxr and EKG  2.  implanon--wants it out.  irregular and heavy bleeding.  arm pain intermittently.  feels like her arm swells then goes down.  not swollen currently  3.  anti-phospholipid antibody--taking aspirn.  has never had a clot, only miscarriages/fetal losses   Habits & Providers  Alcohol-Tobacco-Diet     Tobacco Status: never  Current Medications (verified): 1)  Aspirin 81 Mg Chew (Aspirin) .Marland Kitchen.. 1 Tab By Mouth Daily  Past History:  Past Medical History: Last updated: 10/02/2007 [id number]  1 stillborn 2 miscarriage at 14 weeks and 1 at 16 weeks.  Liiving 6 lupus anticoagulant--dxd in 2005--treated in the past by  ob/gyns sickle cell trait  Review of Systems General:  Denies fever, weakness, and weight loss. CV:  Complains of chest pain or discomfort; denies difficulty breathing at night and difficulty breathing while lying down. Resp:  Complains of chest discomfort and shortness of breath; denies cough and wheezing.  Physical Exam  General:  Well-developed,well-nourished,in no acute distress; alert,appropriate and cooperative throughout examination Chest Wall:  No deformities, masses.  ttp over sternum Lungs:  Normal respiratory effort, chest expands symmetrically. Lungs are clear to auscultation, no crackles or wheezes. Heart:  Normal rate and regular rhythm. S1 and S2 normal without gallop, murmur, click, rub or other extra sounds. Extremities:  no swelling of right arm.  implanon palpable in upper right arm Additional Exam:  vital signs reviewed    Impression & Recommendations:  Problem # 1:  CHEST PAIN (ICD-786.50) Assessment New EKG normal by my read.  think that cardiology referral is appropriate given her comorbid conditions and the dentist's requests.   Orders: EKG- [facility] (EKG) Cardiology Referral (Cardiology) [facility]- Est  Level 4 ([address])  Problem # 2:  CONTRACEPTIVE MANAGEMENT (ICD-V25.09) Assessment: Unchanged  schedule appt to remove implanon.  prefers permanent birth control, such as BTL or esure.  pt is to call women's clinic for appt.  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 1 tab by mouth daily  Patient Instructions: 1)  It was nice to see you today. 2)  We will refer you to the cardiologist.  We will call you with an appointment [phone]). 3)  Schedule a 30 MINUTE APPOINTMENT on one of the following mornings:  4th or 9th to remove your  implanon. 4)  Go ahead and make an appointment with [facility] to discuss tubal ligation or Esure. 5)  Please schedule a follow-up appointment for a physical/pap at your  earliest convenience

## 2010-05-26 NOTE — Progress Notes (Signed)
Summary: phone note/cardiology appt  Phone Note Outgoing Call   Call placed by: Loralee Pacas CMA,  February 16, 2010 9:50 AM Summary of Call: left msg with pt mother Erie Noe and asked her to have pt call me back regarding cardiology appt..  Follow-up for Phone Call        pt returned call and given information regarding appt Follow-up by: Loralee Pacas CMA,  February 16, 2010 11:05 AM

## 2010-05-26 NOTE — Letter (Signed)
Summary: Generic Letter  [facility]  [address]   [place], [place] [address]   Phone: [phone]  Fax: [phone]    08/26/2009  Teara AUER [address] [place], [place]  [address]  Dear Ms. Yetta Barre,      I was unable to contact you by phone. Dr. Lafonda Mosses requested we schedule you an appointment with a cardiologist. An appointment has been scheduled for May 13,2011 at 1:00 PM with Dr. Mayford Knife of [facility]. The address is [address] [place] , [address], phone number is [phone]. If this time is not convenient please contact their office to reschedule.        Thank you.      Sincerely,   Theresia Lo RN

## 2010-05-26 NOTE — Letter (Signed)
Summary: Generic Letter  [facility]  [address]   [place], [place] [address]   Phone: [phone]  Fax: [phone]    09/03/2009  Latoya Guzman [address] [place], [place]  [address]  Dear Latoya Guzman,        I was unable to contact you by phone. Your doctor has requested we schedule an appointment for you at [facility] . An appointment has been scheduled for July 27 , 2011 at 1:15 PM.  I was told you have reapplied for medicaid. When the appointment was scheduled I told them you have medicaid pending . Please take your new card with you when you go to the appointment.        Thank you.        Sincerely,   Theresia Lo RN

## 2010-05-26 NOTE — Assessment & Plan Note (Signed)
Summary: remove inplanon/per Tymar Polyak/eo   Vital Signs:  Patient profile:   35 year old female Height:      71 inches Weight:      226.8 pounds BMI:     31.75 Temp:     98.0 degrees F oral Pulse rate:   76 / minute BP sitting:   108 / 70  (left arm) Cuff size:   regular  Vitals Entered By: Gladstone Pih (Sep 01, 2009 9:15 AM) CC: Remove Implanon Is Patient Diabetic? No Pain Assessment Patient in pain? no        CC:  Remove Implanon.  History of Present Illness: Pt would like implanon removed.  See previous hpi for details  Habits & Providers  Alcohol-Tobacco-Diet     Tobacco Status: never  Physical Exam  Extremities:  no swelling of right arm.  implanon palpable in upper right arm Additional Exam:  vital signs reviewed    Impression & Recommendations:  Problem # 1:  CONTRACEPTIVE MANAGEMENT (ICD-V25.09) Assessment Unchanged implanon removal as requested.  referral to GYN to discuss ESURE for contraception Informed consent obtained and time out performed.  Implanon palpated in right upper arm.  Distal tip of implanon marked.  Area  infiltrated with about 1 cc lidocaine without epi.  Area cleaned with betadine and alcohol.  1/2cm incision made near distal tip of implanon.  Implanon removed with forceps.  Verified that complete implanon was removed.  Steri strip placed over site of incision.  Pt tolerated procedure well.  No complications.   Orders: Gynecologic Referral (Gyn) [facility]- Est Level  2 ([address]) Removal and reinsert of  Implanon or Norplant device ([address])  Complete Medication List: 1)  Aspirin 81 Mg Chew (Aspirin) .Marland Kitchen.. 1 tab by mouth daily  Patient Instructions: 1)  It was nice to see you today. 2)  Watch for signs of infection (redness, swelling, fever, increasing pain).  Call us immediately if you have any of these. 3)  We will help set you up with the GYN clinic at [facility] to discuss Esure.

## 2010-05-28 ENCOUNTER — Ambulatory Visit (HOSPITAL_COMMUNITY)
Admission: RE | Admit: 2010-05-28 | Discharge: 2010-05-28 | Disposition: A | Discharge status: Home / Self Care | Payer: Medicaid Other | Source: Ambulatory Visit | Attending: Obstetrics and Gynecology | Admitting: Obstetrics and Gynecology

## 2010-05-28 ENCOUNTER — Ambulatory Visit: Admit: 2010-05-28 | Payer: Self-pay | Admitting: Obstetrics & Gynecology

## 2010-05-28 ENCOUNTER — Other Ambulatory Visit: Payer: Self-pay

## 2010-05-28 DIAGNOSIS — O34599 Maternal care for other abnormalities of gravid uterus, unspecified trimester: Secondary | ICD-10-CM | POA: Insufficient documentation

## 2010-05-28 DIAGNOSIS — O358XX0 Maternal care for other (suspected) fetal abnormality and damage, not applicable or unspecified: Secondary | ICD-10-CM | POA: Insufficient documentation

## 2010-05-28 DIAGNOSIS — O0940 Supervision of pregnancy with grand multiparity, unspecified trimester: Secondary | ICD-10-CM

## 2010-05-28 DIAGNOSIS — N83209 Unspecified ovarian cyst, unspecified side: Secondary | ICD-10-CM | POA: Insufficient documentation

## 2010-05-28 DIAGNOSIS — R894 Abnormal immunological findings in specimens from other organs, systems and tissues: Secondary | ICD-10-CM

## 2010-05-28 DIAGNOSIS — O34219 Maternal care for unspecified type scar from previous cesarean delivery: Secondary | ICD-10-CM | POA: Insufficient documentation

## 2010-05-28 DIAGNOSIS — O09299 Supervision of pregnancy with other poor reproductive or obstetric history, unspecified trimester: Secondary | ICD-10-CM

## 2010-05-28 NOTE — Progress Notes (Signed)
Summary: record record  ---- Converted from flag ---- ---- 05/13/2010 11:25 AM, Delbert Harness MD wrote: please obtain records of cariology referral thanks! ------------------------------  requested records from [facility] cardiology via medical record answering machine

## 2010-05-28 NOTE — Consult Note (Signed)
Summary: [facility]   Imported By: Knox Royalty 05/21/2010 08:41:19  _____________________________________________________________________  External Attachment:    Type:   Image     Comment:   External Document

## 2010-06-01 LAB — POCT URINALYSIS DIPSTICK
Bilirubin Urine: NEGATIVE
Hgb urine dipstick: NEGATIVE
Ketones, ur: NEGATIVE mg/dL
Protein, ur: NEGATIVE mg/dL
Specific Gravity, Urine: 1.025 (ref 1.005–1.030)
pH: 6 (ref 5.0–8.0)

## 2010-06-10 ENCOUNTER — Encounter: Payer: Self-pay | Admitting: *Deleted

## 2010-06-18 ENCOUNTER — Ambulatory Visit (HOSPITAL_COMMUNITY)
Admission: RE | Admit: 2010-06-18 | Discharge: 2010-06-18 | Disposition: A | Discharge status: Home / Self Care | Payer: Medicaid Other | Source: Ambulatory Visit | Attending: Obstetrics and Gynecology | Admitting: Obstetrics and Gynecology

## 2010-06-18 ENCOUNTER — Other Ambulatory Visit: Payer: Self-pay | Admitting: Obstetrics and Gynecology

## 2010-06-18 DIAGNOSIS — O09219 Supervision of pregnancy with history of pre-term labor, unspecified trimester: Secondary | ICD-10-CM

## 2010-06-18 DIAGNOSIS — O358XX0 Maternal care for other (suspected) fetal abnormality and damage, not applicable or unspecified: Secondary | ICD-10-CM

## 2010-06-18 DIAGNOSIS — O34219 Maternal care for unspecified type scar from previous cesarean delivery: Secondary | ICD-10-CM | POA: Insufficient documentation

## 2010-06-18 DIAGNOSIS — O09299 Supervision of pregnancy with other poor reproductive or obstetric history, unspecified trimester: Secondary | ICD-10-CM

## 2010-06-18 DIAGNOSIS — R894 Abnormal immunological findings in specimens from other organs, systems and tissues: Secondary | ICD-10-CM

## 2010-06-18 DIAGNOSIS — O34599 Maternal care for other abnormalities of gravid uterus, unspecified trimester: Secondary | ICD-10-CM | POA: Insufficient documentation

## 2010-06-18 DIAGNOSIS — O0940 Supervision of pregnancy with grand multiparity, unspecified trimester: Secondary | ICD-10-CM

## 2010-06-18 DIAGNOSIS — Z363 Encounter for antenatal screening for malformations: Secondary | ICD-10-CM | POA: Insufficient documentation

## 2010-06-18 DIAGNOSIS — Z1389 Encounter for screening for other disorder: Secondary | ICD-10-CM | POA: Insufficient documentation

## 2010-06-18 DIAGNOSIS — N83209 Unspecified ovarian cyst, unspecified side: Secondary | ICD-10-CM | POA: Insufficient documentation

## 2010-06-18 DIAGNOSIS — Z3689 Encounter for other specified antenatal screening: Secondary | ICD-10-CM

## 2010-06-19 ENCOUNTER — Encounter: Payer: Self-pay | Admitting: Obstetrics and Gynecology

## 2010-06-19 LAB — CONVERTED CEMR LAB: Clue Cells Wet Prep HPF POC: NONE SEEN

## 2010-06-24 ENCOUNTER — Ambulatory Visit: Payer: Medicaid Other

## 2010-06-24 DIAGNOSIS — O09219 Supervision of pregnancy with history of pre-term labor, unspecified trimester: Secondary | ICD-10-CM

## 2010-07-03 ENCOUNTER — Ambulatory Visit: Payer: Medicaid Other

## 2010-07-03 DIAGNOSIS — O09219 Supervision of pregnancy with history of pre-term labor, unspecified trimester: Secondary | ICD-10-CM

## 2010-07-07 LAB — DIFFERENTIAL
Lymphocytes Relative: 21 % (ref 12–46)
Lymphs Abs: 1.7 10*3/uL (ref 0.7–4.0)
Monocytes Absolute: 0.6 10*3/uL (ref 0.1–1.0)
Monocytes Relative: 8 % (ref 3–12)
Neutro Abs: 5.5 10*3/uL (ref 1.7–7.7)

## 2010-07-07 LAB — COMPREHENSIVE METABOLIC PANEL
Albumin: 4 g/dL (ref 3.5–5.2)
Alkaline Phosphatase: 60 U/L (ref 39–117)
BUN: 9 mg/dL (ref 6–23)
Calcium: 9.3 mg/dL (ref 8.4–10.5)
Potassium: 3.7 mEq/L (ref 3.5–5.1)
Total Protein: 7.7 g/dL (ref 6.0–8.3)

## 2010-07-07 LAB — CBC
HCT: 31.7 % — ABNORMAL LOW (ref 36.0–46.0)
Hemoglobin: 10.3 g/dL — ABNORMAL LOW (ref 12.0–15.0)
Platelets: 277 10*3/uL (ref 150–400)
RBC: 4.38 MIL/uL (ref 3.87–5.11)
RDW: 18.9 % — ABNORMAL HIGH (ref 11.5–15.5)

## 2010-07-07 LAB — WET PREP, GENITAL: Trich, Wet Prep: NONE SEEN

## 2010-07-07 LAB — URINALYSIS, ROUTINE W REFLEX MICROSCOPIC
Hgb urine dipstick: NEGATIVE
Nitrite: NEGATIVE
Protein, ur: NEGATIVE mg/dL
Specific Gravity, Urine: >1.03 — ABNORMAL HIGH (ref 1.005–1.030)
Urobilinogen, UA: 1 mg/dL (ref 0.0–1.0)

## 2010-07-07 LAB — POCT PREGNANCY, URINE: Preg Test, Ur: POSITIVE

## 2010-07-07 LAB — GC/CHLAMYDIA PROBE AMP, GENITAL
Chlamydia, DNA Probe: NEGATIVE
GC Probe Amp, Genital: NEGATIVE

## 2010-07-07 LAB — URINE CULTURE: Culture: NO GROWTH

## 2010-07-09 ENCOUNTER — Other Ambulatory Visit: Payer: Self-pay

## 2010-07-09 ENCOUNTER — Encounter: Payer: Self-pay | Admitting: Physician Assistant

## 2010-07-09 DIAGNOSIS — O09219 Supervision of pregnancy with history of pre-term labor, unspecified trimester: Secondary | ICD-10-CM

## 2010-07-09 DIAGNOSIS — O09299 Supervision of pregnancy with other poor reproductive or obstetric history, unspecified trimester: Secondary | ICD-10-CM

## 2010-07-09 LAB — POCT URINALYSIS DIPSTICK
Hgb urine dipstick: NEGATIVE
Nitrite: NEGATIVE
Protein, ur: NEGATIVE mg/dL
pH: 6 (ref 5.0–8.0)

## 2010-07-13 LAB — URINALYSIS, ROUTINE W REFLEX MICROSCOPIC
Bilirubin Urine: NEGATIVE
Ketones, ur: NEGATIVE mg/dL
Nitrite: NEGATIVE
Urobilinogen, UA: 1 mg/dL (ref 0.0–1.0)

## 2010-07-13 LAB — CBC
HCT: 36.7 % (ref 36.0–46.0)
Hemoglobin: 12.7 g/dL (ref 12.0–15.0)
MCH: 28.6 pg (ref 26.0–34.0)
MCHC: 34.7 g/dL (ref 30.0–36.0)
RDW: 14.6 % (ref 11.5–15.5)

## 2010-07-13 LAB — URINE CULTURE: Culture: NO GROWTH

## 2010-07-13 LAB — WET PREP, GENITAL
Trich, Wet Prep: NONE SEEN
Yeast Wet Prep HPF POC: NONE SEEN

## 2010-07-13 LAB — GC/CHLAMYDIA PROBE AMP, GENITAL
Chlamydia, DNA Probe: NEGATIVE
GC Probe Amp, Genital: NEGATIVE

## 2010-07-13 LAB — POCT PREGNANCY, URINE
Preg Test, Ur: NEGATIVE
Preg Test, Ur: NEGATIVE

## 2010-07-13 LAB — URINE MICROSCOPIC-ADD ON

## 2010-07-16 ENCOUNTER — Ambulatory Visit: Payer: Medicaid Other

## 2010-07-16 ENCOUNTER — Inpatient Hospital Stay (HOSPITAL_COMMUNITY): Payer: Medicaid Other

## 2010-07-16 ENCOUNTER — Encounter (HOSPITAL_COMMUNITY): Payer: Self-pay | Admitting: Radiology

## 2010-07-16 ENCOUNTER — Inpatient Hospital Stay (HOSPITAL_COMMUNITY)
Admission: AD | Admit: 2010-07-16 | Discharge: 2010-07-16 | Disposition: A | Discharge status: Home / Self Care | Payer: Medicaid Other | Source: Ambulatory Visit | Attending: Obstetrics and Gynecology | Admitting: Obstetrics and Gynecology

## 2010-07-16 DIAGNOSIS — R0602 Shortness of breath: Secondary | ICD-10-CM | POA: Insufficient documentation

## 2010-07-16 DIAGNOSIS — O9989 Other specified diseases and conditions complicating pregnancy, childbirth and the puerperium: Secondary | ICD-10-CM

## 2010-07-16 DIAGNOSIS — O09219 Supervision of pregnancy with history of pre-term labor, unspecified trimester: Secondary | ICD-10-CM

## 2010-07-16 DIAGNOSIS — O99891 Other specified diseases and conditions complicating pregnancy: Secondary | ICD-10-CM | POA: Insufficient documentation

## 2010-07-16 LAB — COMPREHENSIVE METABOLIC PANEL
Albumin: 2.8 g/dL — ABNORMAL LOW (ref 3.5–5.2)
BUN: 7 mg/dL (ref 6–23)
Chloride: 107 mEq/L (ref 96–112)
Creatinine, Ser: 0.72 mg/dL (ref 0.4–1.2)
Total Bilirubin: 0.4 mg/dL (ref 0.3–1.2)

## 2010-07-16 LAB — CBC
MCH: 25.5 pg — ABNORMAL LOW (ref 26.0–34.0)
MCHC: 33.9 g/dL (ref 30.0–36.0)
MCV: 75.1 fL — ABNORMAL LOW (ref 78.0–100.0)
Platelets: 206 10*3/uL (ref 150–400)
RBC: 3.65 MIL/uL — ABNORMAL LOW (ref 3.87–5.11)
RDW: 17.1 % — ABNORMAL HIGH (ref 11.5–15.5)

## 2010-07-16 LAB — RAPID URINE DRUG SCREEN, HOSP PERFORMED: Cocaine: NOT DETECTED

## 2010-07-16 LAB — PROTEIN / CREATININE RATIO, URINE
Protein Creatinine Ratio: 0.06 (ref 0.00–0.15)
Total Protein, Urine: 18 mg/dL

## 2010-07-16 MED ORDER — IOHEXOL 300 MG/ML  SOLN
100.0000 mL | Freq: Once | INTRAMUSCULAR | Status: AC | PRN
  Administered 2010-07-16: 100 mL via INTRAVENOUS

## 2010-07-23 DIAGNOSIS — O09219 Supervision of pregnancy with history of pre-term labor, unspecified trimester: Secondary | ICD-10-CM

## 2010-07-23 DIAGNOSIS — R894 Abnormal immunological findings in specimens from other organs, systems and tissues: Secondary | ICD-10-CM

## 2010-07-23 DIAGNOSIS — O09299 Supervision of pregnancy with other poor reproductive or obstetric history, unspecified trimester: Secondary | ICD-10-CM

## 2010-07-30 ENCOUNTER — Ambulatory Visit: Payer: Medicaid Other

## 2010-07-30 ENCOUNTER — Ambulatory Visit (HOSPITAL_COMMUNITY)
Admission: RE | Admit: 2010-07-30 | Discharge: 2010-07-30 | Disposition: A | Discharge status: Home / Self Care | Payer: Medicaid Other | Source: Ambulatory Visit | Attending: Obstetrics and Gynecology | Admitting: Obstetrics and Gynecology

## 2010-07-30 ENCOUNTER — Other Ambulatory Visit: Payer: Self-pay | Admitting: Obstetrics and Gynecology

## 2010-07-30 DIAGNOSIS — O34219 Maternal care for unspecified type scar from previous cesarean delivery: Secondary | ICD-10-CM | POA: Insufficient documentation

## 2010-07-30 DIAGNOSIS — R894 Abnormal immunological findings in specimens from other organs, systems and tissues: Secondary | ICD-10-CM | POA: Insufficient documentation

## 2010-07-30 DIAGNOSIS — D689 Coagulation defect, unspecified: Secondary | ICD-10-CM | POA: Insufficient documentation

## 2010-07-30 DIAGNOSIS — O09219 Supervision of pregnancy with history of pre-term labor, unspecified trimester: Secondary | ICD-10-CM

## 2010-07-30 DIAGNOSIS — O99119 Other diseases of the blood and blood-forming organs and certain disorders involving the immune mechanism complicating pregnancy, unspecified trimester: Secondary | ICD-10-CM | POA: Insufficient documentation

## 2010-07-30 DIAGNOSIS — O358XX0 Maternal care for other (suspected) fetal abnormality and damage, not applicable or unspecified: Secondary | ICD-10-CM

## 2010-08-01 LAB — DIFFERENTIAL
Basophils Relative: 1 % (ref 0–1)
Eosinophils Absolute: 0.2 10*3/uL (ref 0.0–0.7)
Eosinophils Relative: 3 % (ref 0–5)
Monocytes Absolute: 0.4 10*3/uL (ref 0.1–1.0)
Monocytes Relative: 8 % (ref 3–12)

## 2010-08-01 LAB — CBC
HCT: 34.3 % — ABNORMAL LOW (ref 36.0–46.0)
Hemoglobin: 11.6 g/dL — ABNORMAL LOW (ref 12.0–15.0)
MCHC: 33.8 g/dL (ref 30.0–36.0)
MCV: 79.5 fL (ref 78.0–100.0)
RBC: 4.31 MIL/uL (ref 3.87–5.11)

## 2010-08-01 LAB — BASIC METABOLIC PANEL
CO2: 26 mEq/L (ref 19–32)
Chloride: 108 mEq/L (ref 96–112)
Creatinine, Ser: 0.86 mg/dL (ref 0.4–1.2)
GFR calc Af Amer: >60 mL/min (ref >60)
Sodium: 138 mEq/L (ref 135–145)

## 2010-08-01 LAB — POCT CARDIAC MARKERS
CKMB, poc: <1 ng/mL — ABNORMAL LOW (ref 1.0–8.0)
Troponin i, poc: <0.05 ng/mL (ref 0.00–0.09)

## 2010-08-05 LAB — DIFFERENTIAL
Eosinophils Absolute: 0.1 10*3/uL (ref 0.0–0.7)
Lymphs Abs: 1.4 10*3/uL (ref 0.7–4.0)
Monocytes Absolute: 0.3 10*3/uL (ref 0.1–1.0)
Monocytes Relative: 4 % (ref 3–12)
Neutro Abs: 4.9 10*3/uL (ref 1.7–7.7)
Neutrophils Relative %: 72 % (ref 43–77)

## 2010-08-05 LAB — URINE MICROSCOPIC-ADD ON

## 2010-08-05 LAB — URINALYSIS, ROUTINE W REFLEX MICROSCOPIC
Nitrite: NEGATIVE
Protein, ur: NEGATIVE mg/dL
Specific Gravity, Urine: 1.025 (ref 1.005–1.030)
Urobilinogen, UA: 0.2 mg/dL (ref 0.0–1.0)

## 2010-08-05 LAB — WET PREP, GENITAL: Yeast Wet Prep HPF POC: NONE SEEN

## 2010-08-05 LAB — CBC: HCT: 31.8 % — ABNORMAL LOW (ref 36.0–46.0)

## 2010-08-05 LAB — GC/CHLAMYDIA PROBE AMP, GENITAL: Chlamydia, DNA Probe: NEGATIVE

## 2010-08-07 ENCOUNTER — Ambulatory Visit: Payer: Medicaid Other

## 2010-08-07 DIAGNOSIS — O09219 Supervision of pregnancy with history of pre-term labor, unspecified trimester: Secondary | ICD-10-CM

## 2010-08-13 ENCOUNTER — Other Ambulatory Visit: Payer: Self-pay | Admitting: Obstetrics & Gynecology

## 2010-08-13 DIAGNOSIS — O09219 Supervision of pregnancy with history of pre-term labor, unspecified trimester: Secondary | ICD-10-CM

## 2010-08-13 DIAGNOSIS — N909 Noninflammatory disorder of vulva and perineum, unspecified: Secondary | ICD-10-CM

## 2010-08-13 DIAGNOSIS — Z331 Pregnant state, incidental: Secondary | ICD-10-CM

## 2010-08-13 LAB — POCT URINALYSIS DIP (DEVICE)
Nitrite: NEGATIVE
Protein, ur: NEGATIVE mg/dL
pH: 7 (ref 5.0–8.0)

## 2010-08-21 ENCOUNTER — Inpatient Hospital Stay (HOSPITAL_COMMUNITY)
Admission: AD | Admit: 2010-08-21 | Discharge: 2010-08-22 | DRG: 781 | Disposition: A | Discharge status: Home / Self Care | Payer: Medicaid Other | Source: Ambulatory Visit | Attending: Obstetrics and Gynecology | Admitting: Obstetrics and Gynecology

## 2010-08-21 ENCOUNTER — Inpatient Hospital Stay (HOSPITAL_COMMUNITY): Payer: Medicaid Other

## 2010-08-21 DIAGNOSIS — Y92009 Unspecified place in unspecified non-institutional (private) residence as the place of occurrence of the external cause: Secondary | ICD-10-CM

## 2010-08-21 DIAGNOSIS — O4700 False labor before 37 completed weeks of gestation, unspecified trimester: Secondary | ICD-10-CM | POA: Diagnosis present

## 2010-08-21 DIAGNOSIS — R109 Unspecified abdominal pain: Secondary | ICD-10-CM | POA: Diagnosis present

## 2010-08-21 DIAGNOSIS — O99891 Other specified diseases and conditions complicating pregnancy: Principal | ICD-10-CM | POA: Diagnosis present

## 2010-08-21 DIAGNOSIS — S0003XA Contusion of scalp, initial encounter: Secondary | ICD-10-CM | POA: Diagnosis present

## 2010-08-21 LAB — AMNISURE RUPTURE OF MEMBRANE (ROM) NOT AT ARMC: Amnisure ROM: NEGATIVE

## 2010-08-22 DIAGNOSIS — O4700 False labor before 37 completed weeks of gestation, unspecified trimester: Secondary | ICD-10-CM

## 2010-08-22 DIAGNOSIS — S0083XA Contusion of other part of head, initial encounter: Secondary | ICD-10-CM

## 2010-08-22 DIAGNOSIS — O9989 Other specified diseases and conditions complicating pregnancy, childbirth and the puerperium: Secondary | ICD-10-CM

## 2010-08-22 DIAGNOSIS — R109 Unspecified abdominal pain: Secondary | ICD-10-CM

## 2010-08-22 DIAGNOSIS — S0003XA Contusion of scalp, initial encounter: Secondary | ICD-10-CM

## 2010-08-22 DIAGNOSIS — S1093XA Contusion of unspecified part of neck, initial encounter: Secondary | ICD-10-CM

## 2010-08-25 ENCOUNTER — Inpatient Hospital Stay (HOSPITAL_COMMUNITY)
Admission: AD | Admit: 2010-08-25 | Discharge: 2010-08-25 | Disposition: A | Discharge status: Home / Self Care | Payer: Medicaid Other | Source: Ambulatory Visit | Attending: Family Medicine | Admitting: Family Medicine

## 2010-08-25 DIAGNOSIS — D649 Anemia, unspecified: Secondary | ICD-10-CM | POA: Insufficient documentation

## 2010-08-25 DIAGNOSIS — O99019 Anemia complicating pregnancy, unspecified trimester: Secondary | ICD-10-CM | POA: Insufficient documentation

## 2010-08-25 DIAGNOSIS — O4700 False labor before 37 completed weeks of gestation, unspecified trimester: Secondary | ICD-10-CM | POA: Insufficient documentation

## 2010-08-25 DIAGNOSIS — M62838 Other muscle spasm: Secondary | ICD-10-CM | POA: Insufficient documentation

## 2010-08-25 LAB — CBC
HCT: 27.6 % — ABNORMAL LOW (ref 36.0–46.0)
Hemoglobin: 9.6 g/dL — ABNORMAL LOW (ref 12.0–15.0)
MCH: 26.3 pg (ref 26.0–34.0)
MCV: 75.6 fL — ABNORMAL LOW (ref 78.0–100.0)
RBC: 3.65 MIL/uL — ABNORMAL LOW (ref 3.87–5.11)

## 2010-08-25 LAB — URINALYSIS, ROUTINE W REFLEX MICROSCOPIC
Glucose, UA: NEGATIVE mg/dL
Hgb urine dipstick: NEGATIVE
Ketones, ur: 15 mg/dL — AB
pH: 6.5 (ref 5.0–8.0)

## 2010-08-25 LAB — APTT: aPTT: 34 seconds (ref 24–37)

## 2010-08-25 LAB — WET PREP, GENITAL: Clue Cells Wet Prep HPF POC: NONE SEEN

## 2010-08-25 LAB — PROTIME-INR: Prothrombin Time: 14.1 seconds (ref 11.6–15.2)

## 2010-08-25 LAB — FETAL FIBRONECTIN: Fetal Fibronectin: NEGATIVE

## 2010-08-26 LAB — GC/CHLAMYDIA PROBE AMP, GENITAL: GC Probe Amp, Genital: NEGATIVE
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
## 2010-08-27 ENCOUNTER — Ambulatory Visit: Payer: Medicaid Other

## 2010-08-27 ENCOUNTER — Other Ambulatory Visit: Payer: Self-pay | Admitting: Obstetrics and Gynecology

## 2010-08-27 ENCOUNTER — Ambulatory Visit (HOSPITAL_COMMUNITY)
Admission: RE | Admit: 2010-08-27 | Discharge: 2010-08-27 | Disposition: A | Discharge status: Home / Self Care | Payer: Medicaid Other | Source: Ambulatory Visit | Attending: Obstetrics and Gynecology | Admitting: Obstetrics and Gynecology

## 2010-08-27 DIAGNOSIS — O09219 Supervision of pregnancy with history of pre-term labor, unspecified trimester: Secondary | ICD-10-CM

## 2010-08-27 DIAGNOSIS — O34219 Maternal care for unspecified type scar from previous cesarean delivery: Secondary | ICD-10-CM | POA: Insufficient documentation

## 2010-08-27 DIAGNOSIS — O99119 Other diseases of the blood and blood-forming organs and certain disorders involving the immune mechanism complicating pregnancy, unspecified trimester: Secondary | ICD-10-CM | POA: Insufficient documentation

## 2010-08-27 DIAGNOSIS — Z3689 Encounter for other specified antenatal screening: Secondary | ICD-10-CM | POA: Insufficient documentation

## 2010-08-27 DIAGNOSIS — O358XX0 Maternal care for other (suspected) fetal abnormality and damage, not applicable or unspecified: Secondary | ICD-10-CM

## 2010-08-27 DIAGNOSIS — Z331 Pregnant state, incidental: Secondary | ICD-10-CM

## 2010-08-27 DIAGNOSIS — D689 Coagulation defect, unspecified: Secondary | ICD-10-CM | POA: Insufficient documentation

## 2010-08-27 LAB — STREP B DNA PROBE

## 2010-09-03 ENCOUNTER — Other Ambulatory Visit: Payer: Self-pay | Admitting: Obstetrics & Gynecology

## 2010-09-03 ENCOUNTER — Other Ambulatory Visit: Payer: Self-pay | Admitting: Physician Assistant

## 2010-09-03 ENCOUNTER — Ambulatory Visit (HOSPITAL_COMMUNITY)
Admission: RE | Admit: 2010-09-03 | Discharge: 2010-09-03 | Disposition: A | Discharge status: Home / Self Care | Payer: Medicaid Other | Source: Ambulatory Visit | Attending: Obstetrics & Gynecology | Admitting: Obstetrics & Gynecology

## 2010-09-03 DIAGNOSIS — O09299 Supervision of pregnancy with other poor reproductive or obstetric history, unspecified trimester: Secondary | ICD-10-CM

## 2010-09-03 DIAGNOSIS — R894 Abnormal immunological findings in specimens from other organs, systems and tissues: Secondary | ICD-10-CM

## 2010-09-03 DIAGNOSIS — O368190 Decreased fetal movements, unspecified trimester, not applicable or unspecified: Secondary | ICD-10-CM | POA: Insufficient documentation

## 2010-09-03 DIAGNOSIS — Z331 Pregnant state, incidental: Secondary | ICD-10-CM

## 2010-09-03 DIAGNOSIS — O0940 Supervision of pregnancy with grand multiparity, unspecified trimester: Secondary | ICD-10-CM

## 2010-09-03 DIAGNOSIS — O09219 Supervision of pregnancy with history of pre-term labor, unspecified trimester: Secondary | ICD-10-CM

## 2010-09-03 LAB — POCT URINALYSIS DIP (DEVICE)
Bilirubin Urine: NEGATIVE
Glucose, UA: NEGATIVE mg/dL
Hgb urine dipstick: NEGATIVE
Specific Gravity, Urine: 1.025 (ref 1.005–1.030)

## 2010-09-05 ENCOUNTER — Inpatient Hospital Stay (HOSPITAL_COMMUNITY)
Admission: AD | Admit: 2010-09-05 | Discharge: 2010-09-05 | Disposition: A | Discharge status: Home / Self Care | Payer: Medicaid Other | Source: Ambulatory Visit | Attending: Obstetrics & Gynecology | Admitting: Obstetrics & Gynecology

## 2010-09-05 DIAGNOSIS — O212 Late vomiting of pregnancy: Secondary | ICD-10-CM

## 2010-09-05 LAB — URINALYSIS, ROUTINE W REFLEX MICROSCOPIC
Glucose, UA: NEGATIVE mg/dL
Hgb urine dipstick: NEGATIVE
Specific Gravity, Urine: >1.03 — ABNORMAL HIGH (ref 1.005–1.030)

## 2010-09-05 LAB — URINE MICROSCOPIC-ADD ON

## 2010-09-08 NOTE — Discharge Summary (Signed)
NAME:  Latoya Guzman, PFLUM NO.:  [id number]   MEDICAL RECORD NO.:  [id number]          PATIENT TYPE:  INP   LOCATION:  9163                          FACILITY:  [facility]   PHYSICIAN:  Kristen Loader           DATE OF BIRTH:  [birth year]   DATE OF ADMISSION:  08/08/2007  DATE OF DISCHARGE:  08/09/2007                               DISCHARGE SUMMARY   FINAL DIAGNOSES:  The patient was admitted for non-reactive nonstress  test and biophysical profile of 6/10.  A repeat biophysical profile  after 12 hours was 8/8 and the patient's nonstress test was reactive at  the time of discharge.  Fetal distress was ruled out.   SUMMARY OF LAB DATA:  On admission, the patient had a wet prep that  showed no yeast, no Trichomonas, no clue cells, and moderate amount of  white blood cells.  She had urinalysis that showed a specific gravity of  1.025, was negative for glucose, hemoglobin, bilirubin, ketones,  protein, nitrites, and leukocyte esterase.  The patient had a fetal  fibronectin test that was negative.  The patient had ultrasound for  biophysical profile on admission that showed a score of 6/10 and a  nonstress test on admission that was nonreactive.  On the biophysical  profile ultrasound, no breathing was noticed.  The patient had a repeat  the BPP ultrasound on August 09, 2007, that has been reported as with a  score of 8/8 with a reactive NST.  The report is pending at the time of  discharge, but this information has been given by the radiologist by  mouth.   BRIEF SUMMARY OF HOSPITAL COURSE:  This patient is a 35 year old female  G10, P4-2-3-5 at 65 and 2 weeks' gestation who presented to the  maternity admission unit complaining of cramping contractions about 5  minutes apart for the past 2 weeks and also having had lost her mucus  plugs earlier that day.  She stated that she had been feeling her baby  move less than frequently, but still moving well less than usually, but  still  moving.  She denied any vaginal bleeding or loss of fluid.  This  patient has past obstetrical history significant for positive lupus  anticoagulant, sickle cell trait and a 35-week intrauterine fetal  demise.  She is on treatment with Lovenox 40 mg daily, baby aspirin, and  iron.  She also has a history of 2 spontaneous abortions at an early  gestational age.   On admission, the patient's vital signs were stable.  Physical exam was  benign.  A nonstress test showed a fetal heart rate baseline of 130  BPMs, moderate viability, accelerations of 10 to 13 BPMs, no  decelerations, but this test was nonreactive at that time.  Her cervix  was checked and was 1 cm/long/high.  Her cervix was rechecked 3 hours  later and it was unchanged.  The patient had a wet prep and UA and fetal  fibronectin that were negative.  But, her biophysical profile on  ultrasound showed a score of 6/8.  So, she was admitted for further  observation.   The patient was placed on continued monitoring overnight.  During the  night, she was noted that her nonstress test became reactive.  The  patient's vital signs remained stable during her stay.  The next day,  the patient's ultrasound for biophysical profile was repeated, and this  showed a score of 8/8.  At the time of discharge, the patient states  that she feels very mild contractions, but no vaginal bleeding or loss  of fluid, only some mild vaginal discharge.  Her NST shows her fetal  heart rate in the 140s, positive accelerations, moderate viability,  negative decelerations, and is reactive.  She has mild contractions and  uterine irritability every 5 to 10 minutes.  The NST is overall  reassuring.  The patient was continued on her home medications during  her hospitalization.  The patient stated that she was feeling her baby  move well during the night.   DIET:  The patient will continue a regular diet as tolerated.   ACTIVITY:  The patient can resume her  regular activity as tolerated  without heavy lifting.   DISPOSITION:  The patient has been discharged to her home in a stable  condition.   FOLLOWUP:  She will need to follow up at the [facility] at [facility] the next day on August 10, 2007.  The patient has  already set up an appointment for this visit.  The patient was  instructed to continue during kick count and she is to given education  regarding this.   CONTRACEPTION PLAN:  The patient has declined bilateral tubal ligation.  She wishes to get an implant placed after delivery.  Social worker has  been consulted due to the patient's living situation in a half-time  home.     ______________________________  Joanna Puff    ______________________________  Kristen Loader    MM/MEDQ  D:  08/09/2007  T:  08/09/2007  Job:  [id number]   cc:   [facility]  [facility] [place], [place]

## 2010-09-08 NOTE — Discharge Summary (Signed)
NAME:  Latoya Guzman, Latoya Guzman NO.:  [id number]   MEDICAL RECORD NO.:  [id number]          PATIENT TYPE:  WOC   LOCATION:  WOC                          FACILITY:  [facility]   PHYSICIAN:  Tilda Burrow, M.D. DATE OF BIRTH:  [birth year]   DATE OF ADMISSION:  08/17/2007  DATE OF DISCHARGE:  08/21/2007                               DISCHARGE SUMMARY   ADMITTING DIAGNOSES:  1. Pregnancy 34 weeks and 6 days.  2. Nonreassuring fetal status due to biophysical profile of 2/10.  3. Lupus antibodies.  4. Prior cesarean section after trial of labor.   DISCHARGE DIAGNOSES:  1. Pregnancy 34 weeks and 6 days.  2. Nonreassuring fetal status due to BPP of 2/10.  3. Lupus antibodies.  4. Prior cesarean section after trial of labor.  5. Delivered.  6. Postoperative urinary retention.   DISCHARGE MEDICATIONS:  1. Lovenox subcutaneously daily until anticoagulation level of 2.0      obtained on INR.  2. Coumadin 7.5 mg p.o. daily x6 weeks.  3. Percocet 20 tablets 1 q.6 hours p.r.n. severe pain.  4. Motrin 600 mg p.o. q.6 h p.r.n. mild pain.  5. Bethanechol (Urecholine) 25 mg p.o. b.i.d. x1 week.  6. Macrodantin 100 mg p.o. b.i.d. x1 week.   FOLLOWUP:  Followup in 1 week for INR results and consideration of  discontinuing Lovenox.   HOSPITAL SUMMARY:  This is a 35 year old female gravida 8, para 5 with  prior cesarean section x1, also with a obstetric history of a history of  childbirth at 32 weeks with evaluation identifying her as having lupus  antibody.  She has been managed this pregnancy with Lovenox 40 mg  subcutaneously daily with limited compliance.  She was seen on August 17, 2007, in the internal fetal visit, her NST was nonreactive and her  biophysical profile had a result 2/10, not reassuring, and therefore she  was documented as cesarean section.  See HPI for details.  In addition,  to the Lovenox, she was on aspirin 81 mg p.o. daily during the  pregnancy.   HOSPITAL  COURSE:  The patient was taken to the operating room on the day  of admission where a living female infant, full-term, 7 ounces, Apgar's 9  at 9 was delivered with an intact placenta with a nuchal cord x3, very  tight around the vertex with a true nod and the fold.   Postoperative course was notable for the patient being both continued on  her Lovenox 40 mg subcutaneous daily and additionally, she was initiated  on Coumadin 5 mg p.o. daily.  She had postoperative hemoglobin 10.7 and  hematocrit 29.4 compared with preoperative admitting values of 12.9 and  36.5 respectively.  She tolerated regular diet and remained afebrile and  did well otherwise.  Blood type is B positive.   Unfortunately, she had postoperative urinary retention, which required  catheter initially.  Catheter reinsertion initially, then was taken out  and she had recurrent retention, which responded to Urecholine  (Bethanechol) 10 mg hourly with good bladder emptying.  She was  therefore discharged to home on  Urecholine.  She was given antibiotic  Macrodantin as prevention for 5 days as well.   The patient's INR remained subtherapeutic at discharge with INR of 1.2  on the day of discharge when she was on 7.5 mg of Coumadin daily.  She  will be continued on that dose and followup will be in 4-7 days at GYN  clinic which has been arranged.      Tilda Burrow, M.D.  Electronically Signed     JVF/MEDQ  D:  08/21/2007  T:  08/21/2007  Job:  [id number]

## 2010-09-08 NOTE — H&P (Signed)
NAME:  Latoya Guzman, EUSTACHE NO.:  [id number]   MEDICAL RECORD NO.:  [id number]          PATIENT TYPE:  INP   LOCATION:                                [facility]   PHYSICIAN:  Allie Bossier, MD        DATE OF BIRTH:  01/17/1976   DATE OF ADMISSION:  08/17/2007  DATE OF DISCHARGE:                              HISTORY & PHYSICAL   Latoya Guzman is a 35 year old single, black gravida 8, para 5 who is  currently 34-1/2 weeks' estimated gestational age by multiple  ultrasounds.  She has been followed in the high-risk clinic because of  multiple medical problems.  She has positive lupus anticoagulant, she  has sickle cell trait, she has HSV II and a history of a stillbirth at  36 weeks in the year 2000.  She also had a previous C-section in 1998  and had a postpartum hemorrhage which required transfusion.  She has  been on Lovenox 40 mg.  It was prescribed daily.  She generally takes it  two to three times a week however.  Of note, she did take it this  morning at approximately 7:00 a.m. She is not on any other medicines  other than prenatal vitamins.  She was seen today for her scheduled  twice weekly testing. Her biophysical was non-reactive. This is not  unusual for her and she usually requires a biophysical profile to assure  that the fetal status is reassuring.  However, she had a biophysical  profile today with the maternal fetal medicine physician and her total  score was two (for a normal amniotic fluid volume).  I have counseled  her that a cesarean section would be indicated because of the non  reassuring fetal status and please note she is also not been on  prophylaxis for herpes at this point. She currently does deny any  outbreaks however. She declined a tubal ligation at this time.  Fetal  scans have shown an absent nasal bone of the baby but she declined an  amniocentesis.   PAST MEDICAL HISTORY:  1. Maternal lupus antibody.  2. HSV.  3. Previous C-section.  4. Previous intrauterine fetal demise.  5. Grand multiparity.  6. A history of postpartum hemorrhage.  7. Sickle-cell trait.  8. Lupus anticoagulant.  9. Abnormal pap smears in 2004, but her pap smear this year was      normal.   PAST SURGICAL HISTORY:  1. She had a C-section in 1998.  2. She has had a D&C in 2003 for a 16-week fetal loss (twins).   LABS:  She is B positive and rubella immune. Glucola was normal at 92.   MEDICATIONS:  1. Prenatal vitamins.  2. Lovenox.  3. 81 mg of aspirin.  4. Iron daily.   No known drug allergies.  No latex allergies.   SOCIAL HISTORY:  Is negative for tobacco, alcohol or drug use.   PHYSICAL EXAM:  Vital Signs: Stable, afebrile.  Fetal heart rate in the  140s with no accelerations that will qualify her for her a reactive  status.  PHYSICAL EXAMINATION:  HEART:  Regular rate and rhythm.  LUNGS: Clear to auscultation bilaterally.  ABDOMEN: Benign. Gravid.   ASSESSMENT/PLAN:  Is 34-1/2-week pregnancy with a biophysical score of  2/10 and a history of IUSD; will plan for repeat C-section. Because she  took her Lovenox at 7:00 a.m. this morning we will wait until  7:00 p.m.  to do this. This she will be on continuous monitoring until that time.      Allie Bossier, MD  Electronically Signed     MCD/MEDQ  D:  08/17/2007  T:  08/17/2007  Job:  [id number]

## 2010-09-08 NOTE — Op Note (Signed)
NAME:  Latoya Guzman, Latoya Guzman NO.:  [id number]   MEDICAL RECORD NO.:  [id number]          PATIENT TYPE:  INP   LOCATION:                                [facility]  [facility]   PHYSICIAN:  Allie Bossier, MD        DATE OF BIRTH:  03-Mar-1976   DATE OF PROCEDURE:  DATE OF DISCHARGE:                               OPERATIVE REPORT   PREOPERATIVE DIAGNOSES:  1. Nonreassuring fetal status (biophysical 2/10) at 34 and 4/6 weeks      estimated gestational age.  2. Maternal lupus antibodies.  3. Previous C section.  4. History of intrauterine fetal death.   POSTOPERATIVE DIAGNOSES:  1. Nonreassuring fetal status (biophysical 2/10) at 34 and 4/6 weeks      estimated gestational age.  2. Maternal lupus antibodies.  3. Previous C section.  4. History of intrauterine fetal death.   PROCEDURE:  Repeat low transverse cesarean section.   SURGEON:  Clarisa Kindred, MD.   ANESTHESIA:  Alcario Drought, MD.   COMPLICATIONS:  None.   ESTIMATED BLOOD LOSS:  800 mL.   SPECIMEN:  Placenta and cord blood.   FINDINGS:  1. Living female infant, Apgars 9 and 9 at 1 and 5 minutes, weight 4      pounds 7 ounces.  2. Intact placenta with three-vessel cord.  3. Nuchal cord x3, tight, tight.  4. True knot in the cord  5. Normal pelvic anatomy.   PROCEDURE AND FINDINGS:  The risks, benefits, and alternatives of  surgery were explained, understood and accepted.  Consents were signed.  [name] tells me that she has had a postpartum hemorrhage several  times, even necessitating blood transfusion, so I had 2 units typed and  crossed prior to surgery.  Because she had been on Lovenox as early as  today, I checked a PTT and PT/INR.  These were within normal range 12  hours after her last Lovenox dose.  Please note she does take baby  aspirin on a daily basis.  She was taken to the operating room and  spinal anesthesia was done without complication. She was placed in a  dorsal supine position with a left  lateral tilt.  Her abdomen and vagina  were prepped and draped in the usual sterile fashion.  A Foley catheter  was placed, which drained clear urine throughout the case.  After  adequate anesthesia was assured, her pannus was lifted and an incision  was made at the site of her previous incision.  The incision was carried  down through a scant amount of subcutaneous tissue but what was there  was extremely fibrous and scarred.  The fascia was scored in the  midline.  The fascial incision was extended bilaterally and curved  slightly upwards.  The rectus muscles in the midline (50%) were  transected using electrosurgical technique.  Excellent hemostasis was  maintained.  The peritoneum was entered with hemostats.  The peritoneal  incision was extended bilaterally and curved slightly upwards with the  Bovie, taking care to avoid the bladder.  A bladder  blade was placed.  A  transverse incision was made on the poorly-developed lower uterine  segment.  Amniotomy was performed with hemostats and clear fluid was  noted.  Please note the uterine incision was extended bilaterally and  curved slightly upwards with bandage scissors.  The baby was easily  delivered from a vertex presentation.  Three tight nuchal cords were  removed and the baby's cord was clamped and cut and it was transferred  to the pediatrician for routine care.  Please note that the cord was  also wrapped around the baby's legs.  The cord was noted to be fairly  long and a true knot was also noted.  The placenta was manually  extracted and noted to be intact.  The uterus was left in situ.  The  uterine interior was cleaned with a dry lap sponge.  The uterine  incision was closed with 2 layers of 0 chromic running locking suture,  the second layer imbricating the first.  Excellent hemostasis was noted.  The adnexa were visualized and palpated by tilting the uterus to each  side.  They were normal.  The incision was again inspected  and a figure-  of-eight suture was needed in the middle portion of the incision to  yield excellent hemostasis.  The rectus muscles and rectus fascia were  inspected and noted be hemostatic.  The subcutaneous tissue still had  some diffuse oozing around the skin edges.  The uterine incision was  noted again and hemostasis was noted.  The fascia was closed with a  looped #1 PDS suture in a running nonlocking fashion.  The ends of the  suture were carefully placed beneath the previous 2 sutures with  hemostats and this was done to prevent the sutures from proving  uncomfortable to the patient by poking through her skin in light of her  scarcity of subcutaneous tissue.  The subcutaneous tissue was then  infiltrated with 30 mL of 0.5% Marcaine.  The subcutaneous tissue was  then copiously irrigated with warm normal saline.  A subcuticular  closure was done with a 3-0 Vicryl running nonlocking suture and a  pressure dressing was placed.  She was taken to the recovery room in  stable condition.  The instrument, sponge, and needle counts were  correct.  She tolerated the procedure well.      Allie Bossier, MD  Electronically Signed     MCD/MEDQ  D:  08/17/2007  T:  08/17/2007  Job:  [id number]

## 2010-09-10 ENCOUNTER — Other Ambulatory Visit: Payer: Self-pay | Admitting: Obstetrics & Gynecology

## 2010-09-10 ENCOUNTER — Other Ambulatory Visit: Payer: Self-pay | Admitting: Obstetrics and Gynecology

## 2010-09-10 ENCOUNTER — Ambulatory Visit (HOSPITAL_COMMUNITY)
Admission: RE | Admit: 2010-09-10 | Discharge: 2010-09-10 | Disposition: A | Discharge status: Home / Self Care | Payer: Medicaid Other | Source: Ambulatory Visit | Attending: Obstetrics & Gynecology | Admitting: Obstetrics & Gynecology

## 2010-09-10 DIAGNOSIS — O0940 Supervision of pregnancy with grand multiparity, unspecified trimester: Secondary | ICD-10-CM

## 2010-09-10 DIAGNOSIS — Z331 Pregnant state, incidental: Secondary | ICD-10-CM

## 2010-09-10 DIAGNOSIS — O09299 Supervision of pregnancy with other poor reproductive or obstetric history, unspecified trimester: Secondary | ICD-10-CM

## 2010-09-10 DIAGNOSIS — O34219 Maternal care for unspecified type scar from previous cesarean delivery: Secondary | ICD-10-CM | POA: Insufficient documentation

## 2010-09-10 DIAGNOSIS — R894 Abnormal immunological findings in specimens from other organs, systems and tissues: Secondary | ICD-10-CM

## 2010-09-10 DIAGNOSIS — O09219 Supervision of pregnancy with history of pre-term labor, unspecified trimester: Secondary | ICD-10-CM

## 2010-09-10 DIAGNOSIS — O2690 Pregnancy related conditions, unspecified, unspecified trimester: Secondary | ICD-10-CM

## 2010-09-10 DIAGNOSIS — O358XX0 Maternal care for other (suspected) fetal abnormality and damage, not applicable or unspecified: Secondary | ICD-10-CM

## 2010-09-10 LAB — POCT URINALYSIS DIP (DEVICE)
Protein, ur: 30 mg/dL — AB
Urobilinogen, UA: 1 mg/dL (ref 0.0–1.0)

## 2010-09-11 NOTE — Op Note (Signed)
NAME:  Latoya Guzman, Latoya Guzman NO.:  [id number]   MEDICAL RECORD NO.:  [id number]                   PATIENT TYPE:  OUT   LOCATION:  ULT                                  [facility]:  [facility]   PHYSICIAN:  Janine Limbo, M.D.            DATE OF BIRTH:  Nov 17, 1975   DATE OF PROCEDURE:  DATE OF DISCHARGE:                                 OPERATIVE REPORT   PREOPERATIVE DIAGNOSES:  1. Twin gestation.  2. Second trimester missed abortion.  3. Probable cystic hygroma.   POSTOPERATIVE DIAGNOSES:  1. Twin gestation.  2. Second trimester missed abortion.   PROCEDURE:  Suction, dilatation and evacuation under ultrasound guidance.   SURGEON:  Janine Limbo, M.D.   ANESTHETIC:  General.  Paracervical block using 0.5% Marcaine with  epinephrine.   INDICATIONS:  The patient is a 35 year old female, gravida 6, para 3-1-1-3,  who presents at 59 weeks' gestation with twins but intrauterine fetal demise  of both twins.  The patient had two ultrasounds that confirmed the above  findings.  The patient also was noted to have cystic hygromas on both babies  based on her last ultrasound.  The patient understands the indications for  her surgical procedure, and she accepts the risks of, but no limited to,  anesthetic complications, bleeding, infections and possible damage to the  surrounding organs.   FINDINGS:  The patient's blood type is B positive.  The uterus sounded to 18  cm.  A large amount of products of conception were removed from within the  uterine cavity.   DESCRIPTION OF PROCEDURE:  The patient was taken to the operating room where  a general anesthetic was given.  The patient's abdomen, perineum, and vagina  were prepped with multiple layers of Betadine.  The bladder was drained of  urine.  The patient was sterilely draped.  Examination under anesthesia were  performed.  Findings were mentioned above.  A paracervical block was placed  using 10 cc of  0.25% Marcaine with epinephrine.  The uterus was sounded to  18 cm.  The cervix was gradually dilated.  The uterine cavity then was  evacuated using ring forceps.  We then used a size 16 suction curet to  evacuate the uterus.  A large banjo curet was then used.  Hemostasis was  felt to be adequate at the end of our procedure.  The cavity was felt to be  clean.  Ultrasound guidance was used throughout the procedure and ultrasound  confirmed that there appeared to be no evidence of products of conception  remaining within the uterine cavity.  The patient tolerated her procedure  well.  Again, hemostasis was adequate.  All instruments were removed.  The  patient received Ancef, Pitocin, and Toradol during her operative procedure.  The estimated blood loss was 100 cc.  She was awakened from her anesthetic  and taken to the recovery room in stable  condition.  Tissue from the  conceptus was sent for chromosomal studies.    FOLLOW UP INSTRUCTIONS:  The patient was given Vicodin one to two p.o. q.4h.  p.r.n. pain.  She will return to see Dr. Stefano Gaul in two to three weeks for  followup examination.  She was given a copy of the postoperative instruction  sheet as prepared by the [facility] for patients who  have undergone a dilatation and curettage.  She will call for questions or  concerns.                                               Janine Limbo, M.D.    AVS/MEDQ  D:  03/09/2002  T:  03/09/2002  Job:  [id number]

## 2010-09-11 NOTE — H&P (Signed)
NAME:  Latoya Guzman, Latoya Guzman NO.:  [id number]   MEDICAL RECORD NO.:  [id number]          PATIENT TYPE:  MAT   LOCATION:  MATC                          [facility]  [facility]   PHYSICIAN:  Roseanna Rainbow, M.D.DATE OF BIRTH:  November 02, 1975   DATE OF ADMISSION:  07/09/2004  DATE OF DISCHARGE:                                HISTORY & PHYSICAL   CHIEF COMPLAINT:  The patient is a 35 year old, para 4, status post delivery  on May 29, 2004, complaining of vaginal bleeding for several days.   HISTORY OF PRESENT ILLNESS:  As above.  The patient reports normal lochia  for several weeks after delivery.  She then began having heavy vaginal  bleeding last Saturday.  She reports the passage of blood clots.  She denies  any other complaints.  She is not currently on any hormonal birth control.  She has a history of a postpartum hemorrhage with one of her deliveries.   PAST OBSTETRIC AND GYNECOLOGIC HISTORY:  1.  Genital herpes.  2.  Please see the above.  3.  She has had 3 NSVDs and 1 cesarean delivery, 1 IUFD, and 2 second      trimester abortions.   SOCIAL HISTORY:  She denies any history of substance abuse.   PAST MEDICAL HISTORY:  Sickle cell trait.   PAST SURGICAL HISTORY:  Please see the above.   ALLERGIES:  No known drug allergies.   MEDICATIONS:  None.   PHYSICAL EXAMINATION:  VITAL SIGNS:  Temperature 98.3 pulse 70, respirations  20, blood pressure 125/76.  GENERAL:  Well-developed, well-nourished, in no apparent distress.  ABDOMEN:  Nontender.  No organomegaly.  PELVIC:  External female genital, normal appearing.  On speculum exam, there  is large heme.  On bimanual exam, the uterus is anteverted, nontender, upper  limits of normal size.  Adnexa nonpalpable, nontender.   LABORATORY DATA:  Other work-up and laboratory findings - at 1300,  hemoglobin 10.8, platelets 257,000.  Pelvic ultrasound with a thickened  endometrial stripe in the fundus, approximately 1.2  cm, with flow noted.  The sagittal diameter of the uterus was 10.2.   ASSESSMENT:  Vaginal hemorrhage.   DIFFERENTIAL DIAGNOSES:  Rule out delayed postpartum hemorrhage with  retained products of conception, anovulatory bleeding.   PLAN:  Admission.  D&C.      LAJ/MEDQ  D:  07/09/2004  T:  07/09/2004  Job:  [id number]

## 2010-09-11 NOTE — H&P (Signed)
[facility]  Patient:    Latoya Guzman, Latoya Guzman Visit Number: [id number] MRN: [id number]          Service Type: Attending:  Naima A. Normand Sloop, M.D. Dictated by:   Pierre Bali. Normand Sloop, M.D. Proc. Date: 04/25/01                           History and Physical  HISTORY OF PRESENT ILLNESS:   The patient is a 35 year old G5, P2-1-1-2 with a questionable LMP, who has an estimated date of confinement of May 02, 2001 by 6+ week ultrasound.  The patient is 39 weeks today and sent over for labor and delivery for induction secondary to a variable decel and history of a third trimester IUFD.  The patient has had an ultrasound today, which showed that she was vertex with normal fluid and a posterior placenta grade 2.  The patients pregnancy is complicated by a history of:  1) Third trimester IUFD. 2) She had a previous cesarean section with a subsequent VBAC and consent for VBAC currently.  3) She has sickle-cell trait.  Her Chem 9 and urine cultures have been negative.  4) She has a history of group B strep in the past.  She will be given antibiotics in labor.  PAST OBSTETRICAL HISTORY:     Significant for in 1996, she had a vaginal delivery and was significant for positive GBS.  In 1998, she had a primary low-transverse cesarean section secondary to her baby being breech and in 2000, she had an intrauterina fetal demise at 35 weeks unknown cause.  In December 2000, she had a spontaneous abortion in second trimester at 16 weeks unknown cause.  PAST GYNECOLOGIC HISTORY:     Significant for HPV and chlamydia.  She had menarche at age 47 or 47 occurring every 28 days lasting for six days.  PAST MEDICAL HISTORY:         As above.  FAMILY HISTORY:               Significant for heart disease and diabetes mellitus.  SOCIAL HISTORY:               Negative x 3.  PRENATAL LABORATORIES:        The patient has a hemoglobin of 11.4, platelets are 252.  She is B-positive,  antibody negative, sickle-cell trait is positive, RPR is nonreactive. She is rubella immune.  Hepatitis B and HIV are both negative.  GC/chlamydia are negative.  Urine cultures reveal no growth.  MSAFP is within normal limits.  A 1-hour glucola was 76.  The patient was not cultured GBS because she had a history of GBS with the last pregnancy.  The patient has no known drug allergies.  PHYSICAL EXAMINATION:  GENERAL:                      Patient is in no apparent distress.  Patients fundal height was 38.  She weighs 231 pounds.  VITAL SIGNS:                  Blood pressure was 142/64, rechecked - it was 110/60.  ASSESSMENT/PLAN:              Ultrasound was done, which showed a cephalic presentation with normal fetal movement and still grade 2 placenta. Dr. Stefano Gaul saw some wetness at the introitus and labia consistent with yeast with no evidence of HSV.  Her UA was negative.  Options were discussed with the patient, prolonged monitoring versus an induction.  The patient agreed with the Pitocin induction, agrees with VBAC, and understands the risks of possible repeat cesarean section. Dictated by:   Pierre Bali. Normand Sloop, M.D. Attending:  Naima A. Normand Sloop, M.D. DD:  04/25/01 TD:  04/25/01 Job: [id number]

## 2010-09-11 NOTE — Op Note (Signed)
NAME:  Latoya Guzman, FLUTY NO.:  [id number]   MEDICAL RECORD NO.:  [id number]          PATIENT TYPE:  MAT   LOCATION:  MATC                          [facility]   PHYSICIAN:  Roseanna Rainbow, M.D.DATE OF BIRTH:  03-04-1976   DATE OF PROCEDURE:  07/09/2004  DATE OF DISCHARGE:                                 OPERATIVE REPORT   PREOPERATIVE DIAGNOSIS:  Late postpartum hemorrhage, rule out retained  products of conception.   POSTOPERATIVE DIAGNOSIS:  Retained products of conception.   PROCEDURE:  Suction dilatation and curettage.   SURGEON:  Roseanna Rainbow, M.D.   ANESTHESIA:  Managed anesthesia care, paracervical block.   ESTIMATED BLOOD LOSS:  100 mL.   COMPLICATIONS:  None.   IV FLUID:  As per anesthesiology.   URINE OUTPUT:  190 mL clear urine at the beginning of the procedure.   PROCEDURE:  The patient was taken to the operating room with an IV running.  She was then placed in the dorsal lithotomy position and prepped and draped  in the usual sterile fashion. A bivalve speculum was placed into the  patient's vagina.  The anterior lip of the cervix was infiltrated with 2 mL  of 2% lidocaine. A single-tooth tenaculum was then applied to this location.  4 mL of 2% lidocaine were then injected at 5 and 7 o'clock to produce a  paracervical block. The cervix was then dilated with [facility] dilators. An 8 mm  suction curette was then advanced into the cervix and into the uterine  fundus.  The suction curette was then activated and rotated to evacuate the  intrauterine cavity. Moderate tissue was retrieved. Several passes were  made.  A sharp curettage was then performed. A final pass was then made with  the suction curette. The single-tooth tenaculum was then removed from the  cervix with minimal bleeding noted.  All the instruments were removed from  the vagina. At the close of the procedure, the instrument and pack counts  were said to be correct x2.  The patient was taken to the PACU awake and in  stable condition.      LAJ/MEDQ  D:  07/09/2004  T:  07/09/2004  Job:  [id number]

## 2010-09-11 NOTE — H&P (Signed)
NAME:  Latoya Guzman, NIEMEIER NO.:  [id number]   MEDICAL RECORD NO.:  [id number]          PATIENT TYPE:  INP   LOCATION:  9174                          [facility]  [facility]   PHYSICIAN:  Roseanna Rainbow, M.D.DATE OF BIRTH:  1975-05-23   DATE OF ADMISSION:  05/28/2004  DATE OF DISCHARGE:                                HISTORY & PHYSICAL   CHIEF COMPLAINT:  The patient is a 35 year old gravida 7, para 3-1-2-3 with  an estimated date of confinement of May 25, 2004 with an intrauterine  pregnancy at 40+ weeks, favorable Bishop's score, history of a previous  cesarean delivery, for induction of labor.   HISTORY OF PRESENT ILLNESS:  As above.   ANTEPARTUM COURSE PROBLEMS AND RISKS:  1.  History of a previous cesarean delivery for breech with two subsequent      spontaneous vaginal deliveries.  2.  Questionable antiphospholipid syndrome with positive lupus      anticoagulant.  3.  Second trimester spontaneous abortion and a third trimester intrauterine      fetal demise.  4.  Genital herpes.  5.  GBS positive.   PRENATAL SCREENS:  Hemoglobin 10.8, platelets 220,000, blood type B  positive, Rh antibody negative, sickle cell trait negative, RPR nonreactive,  rubella immune, hepatitis B surface antigen negative, HIV nonreactive, HSV  titers positive for 1 and 2, one-hour GCT within normal limits.  Ultrasound  at [facility] performed on March 27, 2004 showed a growth  percentile of 47% for 34 weeks, 0 days.  Mild polyhydramnios had resolved at  this point.   PAST OB/GYN HISTORY:  1.  In 1996 she was delivered of a female, 6 pounds 7 ounces.  2.  Full-term spontaneous vaginal delivery with no complications in 1998.      She was delivered of a female, 7 pounds 12 ounces.  3.  Post-dates by cesarean delivery for breech presentation in 2000.  She      was delivered of a female, 5 pounds.  She required a blood transfusion.  4.  At 36 weeks, intrauterine fetal  demise.  5.  In 2000, she also had a spontaneous second trimester abortion      complicated by vaginal hemorrhage.  6.  In 2003, she was delivered of a female, 7 pounds 6 ounces, full-term      spontaneous vaginal delivery.  7.  In 2003, she had a spontaneous abortion, second trimester twin gestation      completed with a D&C.  8.  She has history of abnormal Pap smears.  9.  See above.   PAST MEDICAL HISTORY:  1.  Anemia.  2.  Urinary tract infection.   PAST SURGICAL HISTORY:  Please see the above.   FAMILY HISTORY:  Diabetes mellitus, breast cancer, leukemia.   SOCIAL HISTORY:  She denies any tobacco, ethanol, or substance abuse.   MEDICATIONS:  Prenatal vitamins.   ALLERGIES:  NO KNOWN DRUG ALLERGIES.   PHYSICAL EXAMINATION:  VITAL SIGNS:  Stable, afebrile.  Fetal heart rate  tracing reassuring, tocodynamometer with regular contractions.  PELVIC:  Sterile vaginal  exam as per the RN, the cervix is closed, thick,  and the presenting part is high.  However, exam in the office was 2, 60%,  with the vertex at a -2 station.   ASSESSMENT:  Multiparous female at term with a history of cesarean delivery  status post two successful vaginal births after cesarean, questionable  antiphospholipid syndrome, also with a history of genital herpes with no  symptoms at present consistent with an outbreak.  The fetal heart tracing is  consistent with fetal wellbeing.  Questionable Bishop's score.  Group B  strep positive.   PLAN:  Admission.  Low-dose Pitocin per protocol.  Penicillin GBS  prophylaxis.      LAJ/MEDQ  D:  05/28/2004  T:  05/28/2004  Job:  [id number]

## 2010-09-11 NOTE — Op Note (Signed)
NAME:  KARMAH, POTOCKI NO.:  [id number]   MEDICAL RECORD NO.:  [id number]          PATIENT TYPE:  AMB   LOCATION:  DFTL                          [facility]:  [facility]   PHYSICIAN:  Almedia Balls. Fore, M.D.   DATE OF BIRTH:  June 13, 1975   DATE OF PROCEDURE:  05/17/2005  DATE OF DISCHARGE:                                 OPERATIVE REPORT   PREOPERATIVE DIAGNOSES:  1.  Intrauterine pregnancy approximately 9 weeks by dates.  2.  Incomplete abortion   POSTOPERATIVE DIAGNOSES:  1.  Intrauterine pregnancy approximately 9 weeks by dates.  2.  Incomplete abortion  3.  Pending pathology.   OPERATION:  Suction dilatation and curettage for incomplete abortion.   SURGEON:  Almedia Balls. Randell Patient, M.D.   ANESTHESIA:  MAC with 10 mL 1% lidocaine paracervical block.   INDICATIONS FOR SURGERY:  The patient is a 35 year old who had presented in  the MAU at [facility] on May 13, 2005 with findings of  intrauterine pregnancy at approximately 9 weeks by dates, 7 weeks by fetal  size without fetal being noted.  The patient was bleeding and cramping at  that time and was told that she would be contacted with the idea a D&C would  b e scheduled for.  She was sent home and began bleeding and cramping  earlier on the evening on May 17, 2005, after having called in earlier  in the day with the physician's office.  On admission through MAU at [facility], on the evening of May 17, 2005, she was noted with tissue  presented through th iris.  She was bleeding and moderately heavy with  clots.  It was felt that she would need to proceed with suction D&C  immediately. She was fully counseled as to the nature of the procedure the  risks involved including risks of anesthesia, injury to uterus, tubes,  ovaries, bowel, bladder blood vessels, ureters, postoperative hemorrhage,  infection, recuperation. She fully understands all these considerations and  wishes to proceed and signed  informed consent to proceed on May 17, 2005.   OPERATIVE FINDINGS:  On examination, the cervix was dilated approximately  1.5 cm.  The tissue was presenting through the os.  The uterus was  approximately 7-[redacted] weeks gestational size.  There were no palpable adnexal  masses.   DESCRIPTION OF PROCEDURE:  With the patient under sedation, prepared and  draped in the usual sterile fashion, a speculum as placed in the vagina.  A  solution of 1% lidocaine was placed at the 3, 9 and 12 o'clock positions for  a total of 10 mL for paracervical block. A single-tooth tenaculum was placed  at 12 o'clock position, and #9 curved suction Vacurette was used for  evacuation of uterine contents.  A medium sharp curette was used for  exploration of the uterine cavity and after no tissue was obtained, the  suction Vacurette was again employed to ensure that all tissue had been  removed.  After noting on this occasion that sponge and instrument counts  were correct and hemostasis was maintained, the procedure  was terminated.  Estimated blood loss 100 mL.  The patient was taken to recovery room in good  condition.   FOLLOWUP CARE:  She is to call for appointment with Dr. Jonathon Bellows. Roman in  approximately 2 weeks and to call for bleeding, failure or should  unexplained fever should ensue.  Blood type was  so no RhoGAM was indicated.  She will call for any problems.   DISCHARGE MEDICATIONS:  1.  She was given a prescription for ampicillin 500 mg #24 to be taken four      stat, one q.i.d.  2.  , Methergine 0.2 mg #8, to be taken one q.i.d. and Darvocet-N 100 mg #10      to be taken 1/2 to one tablet  q.6h. p.r.n. mg pain.           ______________________________  Almedia Balls. Randell Patient, M.D.     SRF/MEDQ  D:  05/17/2005  T:  05/18/2005  Job:  [id number]   cc:   M. Leda Quail, MD
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
## 2010-09-16 ENCOUNTER — Other Ambulatory Visit (HOSPITAL_COMMUNITY): Payer: Medicaid Other

## 2010-09-17 ENCOUNTER — Ambulatory Visit: Payer: Medicaid Other

## 2010-09-17 DIAGNOSIS — O09219 Supervision of pregnancy with history of pre-term labor, unspecified trimester: Secondary | ICD-10-CM

## 2010-09-18 ENCOUNTER — Ambulatory Visit (HOSPITAL_COMMUNITY): Payer: Medicaid Other

## 2010-09-18 ENCOUNTER — Ambulatory Visit (HOSPITAL_COMMUNITY)
Admission: RE | Admit: 2010-09-18 | Discharge: 2010-09-18 | Disposition: A | Discharge status: Home / Self Care | Payer: Medicaid Other | Source: Ambulatory Visit | Attending: Obstetrics and Gynecology | Admitting: Obstetrics and Gynecology

## 2010-09-18 DIAGNOSIS — O358XX0 Maternal care for other (suspected) fetal abnormality and damage, not applicable or unspecified: Secondary | ICD-10-CM

## 2010-09-18 DIAGNOSIS — Z3689 Encounter for other specified antenatal screening: Secondary | ICD-10-CM | POA: Insufficient documentation

## 2010-09-18 DIAGNOSIS — O09299 Supervision of pregnancy with other poor reproductive or obstetric history, unspecified trimester: Secondary | ICD-10-CM | POA: Insufficient documentation

## 2010-09-18 DIAGNOSIS — O2690 Pregnancy related conditions, unspecified, unspecified trimester: Secondary | ICD-10-CM

## 2010-09-24 ENCOUNTER — Other Ambulatory Visit: Payer: Self-pay | Admitting: Obstetrics and Gynecology

## 2010-09-24 ENCOUNTER — Ambulatory Visit (HOSPITAL_COMMUNITY)
Admission: RE | Admit: 2010-09-24 | Discharge: 2010-09-24 | Disposition: A | Discharge status: Home / Self Care | Payer: Medicaid Other | Source: Ambulatory Visit | Attending: Obstetrics and Gynecology | Admitting: Obstetrics and Gynecology

## 2010-09-24 ENCOUNTER — Ambulatory Visit (HOSPITAL_COMMUNITY): Payer: Medicaid Other

## 2010-09-24 ENCOUNTER — Ambulatory Visit: Payer: Medicaid Other

## 2010-09-24 DIAGNOSIS — O358XX0 Maternal care for other (suspected) fetal abnormality and damage, not applicable or unspecified: Secondary | ICD-10-CM

## 2010-09-24 DIAGNOSIS — O09299 Supervision of pregnancy with other poor reproductive or obstetric history, unspecified trimester: Secondary | ICD-10-CM | POA: Insufficient documentation

## 2010-09-24 DIAGNOSIS — O09219 Supervision of pregnancy with history of pre-term labor, unspecified trimester: Secondary | ICD-10-CM

## 2010-09-24 DIAGNOSIS — R894 Abnormal immunological findings in specimens from other organs, systems and tissues: Secondary | ICD-10-CM

## 2010-09-24 DIAGNOSIS — O34219 Maternal care for unspecified type scar from previous cesarean delivery: Secondary | ICD-10-CM

## 2010-09-24 DIAGNOSIS — O0940 Supervision of pregnancy with grand multiparity, unspecified trimester: Secondary | ICD-10-CM

## 2010-09-24 DIAGNOSIS — Z3689 Encounter for other specified antenatal screening: Secondary | ICD-10-CM | POA: Insufficient documentation

## 2010-09-24 LAB — POCT URINALYSIS DIP (DEVICE)
Glucose, UA: NEGATIVE mg/dL
Ketones, ur: NEGATIVE mg/dL
Specific Gravity, Urine: 1.02 (ref 1.005–1.030)

## 2010-09-28 ENCOUNTER — Other Ambulatory Visit: Payer: Medicaid Other

## 2010-09-28 DIAGNOSIS — R894 Abnormal immunological findings in specimens from other organs, systems and tissues: Secondary | ICD-10-CM

## 2010-09-28 DIAGNOSIS — O409XX0 Polyhydramnios, unspecified trimester, not applicable or unspecified: Secondary | ICD-10-CM

## 2010-09-28 DIAGNOSIS — O09299 Supervision of pregnancy with other poor reproductive or obstetric history, unspecified trimester: Secondary | ICD-10-CM

## 2010-10-01 ENCOUNTER — Other Ambulatory Visit: Payer: Self-pay | Admitting: Family Medicine

## 2010-10-01 DIAGNOSIS — O09299 Supervision of pregnancy with other poor reproductive or obstetric history, unspecified trimester: Secondary | ICD-10-CM

## 2010-10-01 DIAGNOSIS — O09219 Supervision of pregnancy with history of pre-term labor, unspecified trimester: Secondary | ICD-10-CM

## 2010-10-01 DIAGNOSIS — O409XX0 Polyhydramnios, unspecified trimester, not applicable or unspecified: Secondary | ICD-10-CM

## 2010-10-01 LAB — POCT URINALYSIS DIP (DEVICE)
Ketones, ur: 15 mg/dL — AB
Protein, ur: 30 mg/dL — AB
Specific Gravity, Urine: >=1.03 (ref 1.005–1.030)
pH: 5.5 (ref 5.0–8.0)

## 2010-10-06 ENCOUNTER — Other Ambulatory Visit: Payer: Medicaid Other

## 2010-10-06 ENCOUNTER — Other Ambulatory Visit: Payer: Self-pay | Admitting: Obstetrics & Gynecology

## 2010-10-06 DIAGNOSIS — O409XX0 Polyhydramnios, unspecified trimester, not applicable or unspecified: Secondary | ICD-10-CM

## 2010-10-06 DIAGNOSIS — O09299 Supervision of pregnancy with other poor reproductive or obstetric history, unspecified trimester: Secondary | ICD-10-CM

## 2010-10-08 ENCOUNTER — Other Ambulatory Visit: Payer: Medicaid Other

## 2010-10-08 ENCOUNTER — Other Ambulatory Visit: Payer: Self-pay | Admitting: Obstetrics & Gynecology

## 2010-10-08 DIAGNOSIS — O09299 Supervision of pregnancy with other poor reproductive or obstetric history, unspecified trimester: Secondary | ICD-10-CM

## 2010-10-08 DIAGNOSIS — O09219 Supervision of pregnancy with history of pre-term labor, unspecified trimester: Secondary | ICD-10-CM

## 2010-10-08 DIAGNOSIS — O409XX0 Polyhydramnios, unspecified trimester, not applicable or unspecified: Secondary | ICD-10-CM

## 2010-10-08 LAB — POCT URINALYSIS DIP (DEVICE)
Hgb urine dipstick: NEGATIVE
Protein, ur: NEGATIVE mg/dL
Specific Gravity, Urine: 1.025 (ref 1.005–1.030)
Urobilinogen, UA: 1 mg/dL (ref 0.0–1.0)

## 2010-10-12 ENCOUNTER — Other Ambulatory Visit: Payer: Medicaid Other

## 2010-10-12 DIAGNOSIS — O409XX0 Polyhydramnios, unspecified trimester, not applicable or unspecified: Secondary | ICD-10-CM

## 2010-10-12 DIAGNOSIS — O09299 Supervision of pregnancy with other poor reproductive or obstetric history, unspecified trimester: Secondary | ICD-10-CM

## 2010-10-14 ENCOUNTER — Inpatient Hospital Stay (HOSPITAL_COMMUNITY)
Admission: EM | Admit: 2010-10-14 | Discharge: 2010-10-14 | Disposition: A | Discharge status: Home / Self Care | Payer: Medicaid Other | Source: Ambulatory Visit | Attending: Obstetrics & Gynecology | Admitting: Obstetrics & Gynecology

## 2010-10-14 DIAGNOSIS — R894 Abnormal immunological findings in specimens from other organs, systems and tissues: Secondary | ICD-10-CM | POA: Insufficient documentation

## 2010-10-14 DIAGNOSIS — Z79899 Other long term (current) drug therapy: Secondary | ICD-10-CM | POA: Insufficient documentation

## 2010-10-14 DIAGNOSIS — M259 Joint disorder, unspecified: Secondary | ICD-10-CM | POA: Insufficient documentation

## 2010-10-14 DIAGNOSIS — D573 Sickle-cell trait: Secondary | ICD-10-CM | POA: Insufficient documentation

## 2010-10-14 DIAGNOSIS — O99891 Other specified diseases and conditions complicating pregnancy: Secondary | ICD-10-CM | POA: Insufficient documentation

## 2010-10-14 DIAGNOSIS — M549 Dorsalgia, unspecified: Secondary | ICD-10-CM | POA: Insufficient documentation

## 2010-10-14 DIAGNOSIS — M899 Disorder of bone, unspecified: Secondary | ICD-10-CM | POA: Insufficient documentation

## 2010-10-14 DIAGNOSIS — D689 Coagulation defect, unspecified: Secondary | ICD-10-CM | POA: Insufficient documentation

## 2010-10-14 DIAGNOSIS — R109 Unspecified abdominal pain: Secondary | ICD-10-CM | POA: Insufficient documentation

## 2010-10-14 DIAGNOSIS — Z7901 Long term (current) use of anticoagulants: Secondary | ICD-10-CM | POA: Insufficient documentation

## 2010-10-14 DIAGNOSIS — O9989 Other specified diseases and conditions complicating pregnancy, childbirth and the puerperium: Secondary | ICD-10-CM | POA: Insufficient documentation

## 2010-10-14 DIAGNOSIS — O99119 Other diseases of the blood and blood-forming organs and certain disorders involving the immune mechanism complicating pregnancy, unspecified trimester: Secondary | ICD-10-CM | POA: Insufficient documentation

## 2010-10-14 LAB — URINALYSIS, ROUTINE W REFLEX MICROSCOPIC
Glucose, UA: NEGATIVE mg/dL
Hgb urine dipstick: NEGATIVE
Protein, ur: NEGATIVE mg/dL
pH: 6.5 (ref 5.0–8.0)

## 2010-10-15 ENCOUNTER — Ambulatory Visit (HOSPITAL_COMMUNITY)
Admission: RE | Admit: 2010-10-15 | Discharge: 2010-10-15 | Disposition: A | Discharge status: Home / Self Care | Payer: Medicaid Other | Source: Ambulatory Visit | Attending: Obstetrics & Gynecology | Admitting: Obstetrics & Gynecology

## 2010-10-15 ENCOUNTER — Other Ambulatory Visit: Payer: Medicaid Other

## 2010-10-15 DIAGNOSIS — O09219 Supervision of pregnancy with history of pre-term labor, unspecified trimester: Secondary | ICD-10-CM

## 2010-10-15 DIAGNOSIS — Z3689 Encounter for other specified antenatal screening: Secondary | ICD-10-CM | POA: Insufficient documentation

## 2010-10-15 DIAGNOSIS — O09299 Supervision of pregnancy with other poor reproductive or obstetric history, unspecified trimester: Secondary | ICD-10-CM

## 2010-10-15 DIAGNOSIS — O409XX0 Polyhydramnios, unspecified trimester, not applicable or unspecified: Secondary | ICD-10-CM

## 2010-10-15 DIAGNOSIS — R894 Abnormal immunological findings in specimens from other organs, systems and tissues: Secondary | ICD-10-CM

## 2010-10-19 ENCOUNTER — Other Ambulatory Visit: Payer: Medicaid Other

## 2010-10-19 DIAGNOSIS — O409XX0 Polyhydramnios, unspecified trimester, not applicable or unspecified: Secondary | ICD-10-CM

## 2010-10-19 DIAGNOSIS — R894 Abnormal immunological findings in specimens from other organs, systems and tissues: Secondary | ICD-10-CM

## 2010-10-19 DIAGNOSIS — O09299 Supervision of pregnancy with other poor reproductive or obstetric history, unspecified trimester: Secondary | ICD-10-CM

## 2010-10-22 ENCOUNTER — Other Ambulatory Visit: Payer: Medicaid Other

## 2010-10-22 ENCOUNTER — Ambulatory Visit (HOSPITAL_COMMUNITY)
Admission: RE | Admit: 2010-10-22 | Discharge: 2010-10-22 | Disposition: A | Discharge status: Home / Self Care | Payer: Medicaid Other | Source: Ambulatory Visit | Attending: Obstetrics and Gynecology | Admitting: Obstetrics and Gynecology

## 2010-10-22 ENCOUNTER — Other Ambulatory Visit: Payer: Self-pay | Admitting: Obstetrics & Gynecology

## 2010-10-22 DIAGNOSIS — O0940 Supervision of pregnancy with grand multiparity, unspecified trimester: Secondary | ICD-10-CM

## 2010-10-22 DIAGNOSIS — O34219 Maternal care for unspecified type scar from previous cesarean delivery: Secondary | ICD-10-CM

## 2010-10-22 DIAGNOSIS — O409XX0 Polyhydramnios, unspecified trimester, not applicable or unspecified: Secondary | ICD-10-CM | POA: Insufficient documentation

## 2010-10-22 DIAGNOSIS — O09219 Supervision of pregnancy with history of pre-term labor, unspecified trimester: Secondary | ICD-10-CM

## 2010-10-22 DIAGNOSIS — Z3689 Encounter for other specified antenatal screening: Secondary | ICD-10-CM | POA: Insufficient documentation

## 2010-10-22 DIAGNOSIS — O358XX0 Maternal care for other (suspected) fetal abnormality and damage, not applicable or unspecified: Secondary | ICD-10-CM

## 2010-10-22 DIAGNOSIS — R894 Abnormal immunological findings in specimens from other organs, systems and tissues: Secondary | ICD-10-CM

## 2010-10-22 LAB — POCT URINALYSIS DIP (DEVICE)
Glucose, UA: NEGATIVE mg/dL
Hgb urine dipstick: NEGATIVE
Ketones, ur: NEGATIVE mg/dL
Specific Gravity, Urine: 1.025 (ref 1.005–1.030)
Urobilinogen, UA: 1 mg/dL (ref 0.0–1.0)

## 2010-10-26 ENCOUNTER — Other Ambulatory Visit: Payer: Medicaid Other

## 2010-10-27 ENCOUNTER — Other Ambulatory Visit: Payer: Medicaid Other

## 2010-10-27 DIAGNOSIS — O409XX0 Polyhydramnios, unspecified trimester, not applicable or unspecified: Secondary | ICD-10-CM

## 2010-10-29 ENCOUNTER — Other Ambulatory Visit: Payer: Self-pay | Admitting: Obstetrics and Gynecology

## 2010-10-29 ENCOUNTER — Other Ambulatory Visit: Payer: Medicaid Other

## 2010-10-29 DIAGNOSIS — O3660X0 Maternal care for excessive fetal growth, unspecified trimester, not applicable or unspecified: Secondary | ICD-10-CM

## 2010-10-29 DIAGNOSIS — O409XX0 Polyhydramnios, unspecified trimester, not applicable or unspecified: Secondary | ICD-10-CM

## 2010-10-29 LAB — POCT URINALYSIS DIP (DEVICE)
Hgb urine dipstick: NEGATIVE
Nitrite: NEGATIVE
Specific Gravity, Urine: >=1.03 (ref 1.005–1.030)
pH: 6 (ref 5.0–8.0)

## 2010-11-02 ENCOUNTER — Ambulatory Visit (INDEPENDENT_AMBULATORY_CARE_PROVIDER_SITE_OTHER): Payer: Medicaid Other | Admitting: Family Medicine

## 2010-11-02 ENCOUNTER — Other Ambulatory Visit: Payer: Medicaid Other | Admitting: *Deleted

## 2010-11-02 DIAGNOSIS — O409XX0 Polyhydramnios, unspecified trimester, not applicable or unspecified: Secondary | ICD-10-CM

## 2010-11-02 DIAGNOSIS — O3660X0 Maternal care for excessive fetal growth, unspecified trimester, not applicable or unspecified: Secondary | ICD-10-CM

## 2010-11-05 ENCOUNTER — Ambulatory Visit (INDEPENDENT_AMBULATORY_CARE_PROVIDER_SITE_OTHER): Payer: Medicaid Other | Admitting: *Deleted

## 2010-11-05 ENCOUNTER — Other Ambulatory Visit: Payer: Medicaid Other | Admitting: Family Medicine

## 2010-11-05 DIAGNOSIS — O409XX0 Polyhydramnios, unspecified trimester, not applicable or unspecified: Secondary | ICD-10-CM

## 2010-11-05 DIAGNOSIS — O3660X0 Maternal care for excessive fetal growth, unspecified trimester, not applicable or unspecified: Secondary | ICD-10-CM

## 2010-11-05 DIAGNOSIS — O09299 Supervision of pregnancy with other poor reproductive or obstetric history, unspecified trimester: Secondary | ICD-10-CM

## 2010-11-05 LAB — FETAL NONSTRESS TEST

## 2010-11-09 ENCOUNTER — Other Ambulatory Visit: Payer: Medicaid Other

## 2010-11-10 ENCOUNTER — Other Ambulatory Visit: Payer: Medicaid Other

## 2010-11-12 ENCOUNTER — Ambulatory Visit (HOSPITAL_COMMUNITY)
Admission: RE | Admit: 2010-11-12 | Discharge: 2010-11-12 | Disposition: A | Discharge status: Home / Self Care | Payer: Medicaid Other | Source: Ambulatory Visit | Attending: Physician Assistant | Admitting: Physician Assistant

## 2010-11-12 ENCOUNTER — Ambulatory Visit: Payer: Medicaid Other | Admitting: *Deleted

## 2010-11-12 ENCOUNTER — Other Ambulatory Visit: Payer: Self-pay | Admitting: Physician Assistant

## 2010-11-12 DIAGNOSIS — O34219 Maternal care for unspecified type scar from previous cesarean delivery: Secondary | ICD-10-CM | POA: Insufficient documentation

## 2010-11-12 DIAGNOSIS — Z3689 Encounter for other specified antenatal screening: Secondary | ICD-10-CM

## 2010-11-12 DIAGNOSIS — O09299 Supervision of pregnancy with other poor reproductive or obstetric history, unspecified trimester: Secondary | ICD-10-CM

## 2010-11-12 DIAGNOSIS — O409XX0 Polyhydramnios, unspecified trimester, not applicable or unspecified: Secondary | ICD-10-CM

## 2010-11-12 LAB — POCT URINALYSIS DIP (DEVICE)
Bilirubin Urine: NEGATIVE
Glucose, UA: NEGATIVE mg/dL
Ketones, ur: NEGATIVE mg/dL
Leukocytes, UA: NEGATIVE
Specific Gravity, Urine: 1.025 (ref 1.005–1.030)

## 2010-11-13 ENCOUNTER — Inpatient Hospital Stay (HOSPITAL_COMMUNITY): Admission: RE | Admit: 2010-11-13 | Payer: Medicaid Other | Source: Ambulatory Visit

## 2010-11-13 ENCOUNTER — Encounter (HOSPITAL_COMMUNITY): Payer: Medicaid Other

## 2010-11-13 ENCOUNTER — Inpatient Hospital Stay (HOSPITAL_COMMUNITY): Payer: Medicaid Other | Admitting: Anesthesiology

## 2010-11-13 ENCOUNTER — Observation Stay (HOSPITAL_COMMUNITY)
Admission: AD | Admit: 2010-11-13 | Discharge: 2010-11-13 | DRG: 782 | Disposition: A | Discharge status: Home / Self Care | Payer: Medicaid Other | Source: Ambulatory Visit | Attending: Obstetrics & Gynecology | Admitting: Obstetrics & Gynecology

## 2010-11-13 ENCOUNTER — Encounter (HOSPITAL_COMMUNITY): Payer: Self-pay | Admitting: Anesthesiology

## 2010-11-13 ENCOUNTER — Encounter (HOSPITAL_COMMUNITY): Payer: Self-pay | Admitting: *Deleted

## 2010-11-13 DIAGNOSIS — R894 Abnormal immunological findings in specimens from other organs, systems and tissues: Secondary | ICD-10-CM | POA: Diagnosis present

## 2010-11-13 DIAGNOSIS — O34219 Maternal care for unspecified type scar from previous cesarean delivery: Secondary | ICD-10-CM | POA: Diagnosis present

## 2010-11-13 DIAGNOSIS — D689 Coagulation defect, unspecified: Principal | ICD-10-CM | POA: Diagnosis present

## 2010-11-13 HISTORY — DX: Benign neoplasm of connective and other soft tissue, unspecified: D21.9

## 2010-11-13 HISTORY — DX: Encounter for other specified aftercare: Z51.89

## 2010-11-13 HISTORY — DX: Rheumatic tricuspid insufficiency: I07.1

## 2010-11-13 HISTORY — DX: Major depressive disorder, single episode, unspecified: F32.9

## 2010-11-13 HISTORY — DX: Congenital mitral insufficiency: Q23.3

## 2010-11-13 HISTORY — DX: Depression, unspecified: F32.A

## 2010-11-13 LAB — CBC
MCHC: 34.7 g/dL (ref 30.0–36.0)
Platelets: 217 10*3/uL (ref 150–400)
RDW: 16 % — ABNORMAL HIGH (ref 11.5–15.5)
WBC: 6.7 10*3/uL (ref 4.0–10.5)

## 2010-11-13 LAB — RPR: RPR Ser Ql: NONREACTIVE

## 2010-11-13 LAB — PREPARE RBC (CROSSMATCH)

## 2010-11-13 LAB — ABO/RH

## 2010-11-13 LAB — HIV ANTIBODY (ROUTINE TESTING W REFLEX): HIV: NONREACTIVE

## 2010-11-13 LAB — RUBELLA ANTIBODY, IGM: Rubella: IMMUNE

## 2010-11-13 LAB — TYPE AND SCREEN: Antibody Screen: NEGATIVE

## 2010-11-13 MED ORDER — LIDOCAINE HCL (PF) 1 % IJ SOLN: 30.0000 mL | INTRAMUSCULAR | Status: DC | PRN

## 2010-11-13 MED ORDER — OXYCODONE-ACETAMINOPHEN 5-325 MG PO TABS: 2.0000 | ORAL_TABLET | ORAL | Status: DC | PRN

## 2010-11-13 MED ORDER — LACTATED RINGERS IV SOLN: 500.0000 mL | INTRAVENOUS | Status: DC | PRN

## 2010-11-13 MED ORDER — OXYTOCIN 20 UNITS IN LACTATED RINGERS INFUSION - SIMPLE: 125.0000 mL/h | Freq: Once | INTRAVENOUS | Status: DC

## 2010-11-13 MED ORDER — LACTATED RINGERS IV SOLN
INTRAVENOUS | Status: DC
  Administered 2010-11-13: 12:00:00 via INTRAVENOUS

## 2010-11-13 MED ORDER — CITRIC ACID-SODIUM CITRATE 334-500 MG/5ML PO SOLN: 30.0000 mL | ORAL | Status: DC | PRN

## 2010-11-13 MED ORDER — FLEET ENEMA 7-19 GM/118ML RE ENEM: 1.0000 | ENEMA | RECTAL | Status: DC | PRN

## 2010-11-13 MED ORDER — NALBUPHINE SYRINGE 5 MG/0.5 ML: 5.0000 mg | INJECTION | INTRAMUSCULAR | Status: DC | PRN

## 2010-11-13 MED ORDER — IBUPROFEN 600 MG PO TABS: 600.0000 mg | ORAL_TABLET | Freq: Four times a day (QID) | ORAL | Status: DC | PRN

## 2010-11-13 MED ORDER — ONDANSETRON HCL 4 MG/2ML IJ SOLN: 4.0000 mg | Freq: Four times a day (QID) | INTRAMUSCULAR | Status: DC | PRN

## 2010-11-13 MED ORDER — ACETAMINOPHEN 325 MG PO TABS: 650.0000 mg | ORAL_TABLET | ORAL | Status: DC | PRN

## 2010-11-13 NOTE — Progress Notes (Signed)
  Plan discussed with the patient and pt will d/c home.  Pt counseled to return in the am, npo after midnight, hold lovenox.

## 2010-11-13 NOTE — Anesthesia Preprocedure Evaluation (Addendum)
Anesthesia Evaluation  Name, MR# and DOB Patient awake  General Assessment Comment  Reviewed: Allergy & Precautions, H&P  and Patient's Chart, lab work & pertinent test results  Airway Mallampati: II and III TM Distance: >3 FB Neck ROM: full    Dental No notable dental hx    Pulmonaryneg pulmonary ROS    clear to auscultation  pulmonary exam normal   Cardiovascular regular Normal   Neuro/Psych (+) {AN ROS/MED HX NEURO HEADACHES (+) PSYCHIATRIC DISORDERS, Negative Neurological ROS Negative Psych ROS  GI/Hepatic/Renal negative GI ROS, negative Liver ROS, and negative Renal ROS (+)       Endo/Other  Negative Endocrine ROS (+)   Abdominal   Musculoskeletal  Hematology negative hematology ROS (+) Lupus anticoagulant on lovneox   Peds  Reproductive/Obstetrics (+) Pregnancy   Anesthesia Other Findings             Anesthesia Physical Anesthesia Plan  ASA: III  Anesthesia Plan: Epidural   Post-op Pain Management:    Induction:   Airway Management Planned:   Additional Equipment:   Intra-op Plan:   Post-operative Plan:   Informed Consent: I have reviewed the patients History and Physical, chart, labs and discussed the procedure including the risks, benefits and alternatives for the proposed anesthesia with the patient or authorized representative who has indicated his/her understanding and acceptance.     Plan Discussed with:   Anesthesia Plan Comments:        Discussed timing of lovenox and epidural versus spinal and recommend no induction until 12 hours after last lovenox 40 dose.  Discussed plan with patient and OB MD Anesthesia Quick Evaluation

## 2010-11-13 NOTE — Progress Notes (Signed)
Pt s/e with PGY3.  Agree with above history and physical.  Discussed with patient at length risks of VBAC including but not limited to risk of uterine rupture, risk of fetal demise, risk of emergent cesarean delivery, risk of bleeding, and risk of blood loss.  Pt is still considering VBAC.  Pt has reviewed/read the VBAC consent form.  Pt cx is 2/60/-3-favorable.  Will await pt decision.  Pt last had lovenox at 0800.  Pt needs 12 hours after last lovenox per anesthesia prior to spinal.  Continue expectant management.

## 2010-11-13 NOTE — Discharge Summary (Signed)
Physician Discharge Summary   Patient ID: Latoya Guzman [id number] 35 y.o. [birth year]  Admit date: 11/13/2010  Discharge date and time: 11/13/2010  3:30 PM   Admitting Physician: Dr. Debroah Loop, Dr. Orvan Falconer, MD   Discharge Physician: Dr. Orvan Falconer, MD  Admission Diagnoses: Induction of labor, prev C/S x2, multiparity, h/o IUFD, +Lupus anticoagulant controlled with lovenox  Discharge Diagnoses: IUP at 39.1wk, declined TOLAC, prev C/S x2, multiparity, h/o IUFD, +Lupus anticoagulant controlled with lovenox  Admission Condition: stable  Discharged Condition: stable  Hospital Course: Pt admitted for IOL for h/o IUFD with her prenatal history also complicated by +lupus on lovenox, and h/o PTL, h/o prev c/sx2 with VBACx4.  Pt counseled extensively regarding risk of VBAC.  Due to this pt declined TOLAC and elected for repeat C/S.  Because the pt took her lovenox at 0800am, it was decided in consult with anesthesia to reschedule the pt for repeat C/S in am.  Pt agreed with the treatment plan.    Consults: anesthesia  Disposition: Home or Self Care  Patient Instructions:  Discharge Medication List as of 11/13/2010  3:03 PM    CONTINUE these medications which have NOT CHANGED   Details  amoxicillin (AMOXIL) 500 MG capsule Take 500 mg by mouth 4 (four) times daily.  , Until Discontinued, Historical Med    enoxaparin (LOVENOX) 40 MG/0.4ML SOLN Inject 40 mg into the skin daily.  , Until Discontinued, Historical Med    prenatal vitamin w/FE, FA (PRENATAL 1 + 1) 27-1 MG TABS Take 1 tablet by mouth daily.  , Until Discontinued, Historical Med      STOP taking these medications     aspirin 81 MG chewable tablet        Activity: activity as tolerated Diet: regular diet and NPO after midnight Wound Care: none needed  Follow-up in 1 day-repeat cesarean scheduled for 7/21 at 0900.  Pt advised to hold her am lovenox for her surgery as well.  Signed: Annika Selke 11/13/2010 5:27 PM

## 2010-11-13 NOTE — H&P (Signed)
  Latoya Guzman is a 35 y.o. female [id number] at 39.1 weeks presenting for IOL for h/o IUFD and polyhydramnios. EDC is 11/19/10 by u/s.  Occasional contractions.  Intact membranes.  No vaginal bleeding.  + fetal activity. + lupus anticoagulant and has been on lovenox during pregnancy.  [facility] obtained [facility].  Presented to care in first trimester. History OB History    Grav Para Term Preterm Abortions TAB SAB Ect Mult Living   10 7 5 2 2  2  1 6     h/o breech, h/o stillborn and oligo, h/o fetal demise, h/o twin fetal demise, h/o preterm labor, h/o c/s x2, last delivery c/s 2/2 nonreassuring fetal heart tones.  Past Medical History  Diagnosis Date  . Blood transfusion 1998    Post C/S 2units  . Depression     Was on Lexapro Stopped when pregnant  . Preterm labor     PTL last two pregnancies  . Fibroid 03/2010    Noted on [place] in Dec.  . 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 Tricuspid valve regurgitation 02/27/2010  Sickle cell trait  +lupus anticoagulant Fibroid noticed on 04/03/10 u/s  Meds- lovenox, pnv, amoxicillin (for dental infection)  Allergies- NKDA   Past Surgical History  Procedure Date  . Dilation and curettage of uterus 2005, 2006    1st for twin loss, 2nd for retained placenta  . Cesarean section 1998, 2009   Family History: Mother - hypertension  Sister- lupus Social History:  reports that she has never smoked. She has never used smokeless tobacco. She reports that she does not drink alcohol or use illicit drugs.   ROS Review of Systems - Negative except for HPI  Dilation: 2 Effacement (%): 60 Station: -3 Exam by:: campbell Blood pressure 106/55, pulse 76, temperature 97.9 F (36.6 C), temperature source Oral, height 5\' 11"  (1.803 m), weight 106.595 kg (235 lb). Exam Physical Exam  Head: Normocephalic, without obvious abnormality, atraumatic Lungs: clear to auscultation  bilaterally Heart: regular rate and rhythm and + systolic murmur Abdomen: gravid Extremities: trace edema Skin: no rash Neurologic: Grossly normal  Prenatal labs: ABO, Rh:  B+,  Antibody: Negative (07/20 0000) Rubella:  immune RPR: Nonreactive (07/20 0000)  HBsAg: Negative (07/20 0000)  HIV: Non-reactive (07/20 0000)  hgb- 9.6 Platelets-183 Syphilis- NR GC/chlam- neg GBS: NEGATIVE (05/01 2234)  1 hour glucola- 91  Assessment/Plan:   Latoya Guzman is a 35 y.o. female [id number] at 39.1 weeks presenting for IOL for h/o IUFD and polyhydramnios. - at time of presenting for IOL pt had questions about plan of care and states that she may want a c-section instead.  Dr. Orvan Falconer called to bedside to discuss vbac vs c/s.  Anesthesiology to bedside to discuss anesthesia with pt.  Will give pt to think about her decision and then will decide on plan of care.     Latoya Guzman 11/13/2010, 12:41 PM

## 2010-11-14 ENCOUNTER — Encounter (HOSPITAL_COMMUNITY)
Admission: AD | Disposition: A | Discharge status: Home / Self Care | Payer: Self-pay | Source: Ambulatory Visit | Attending: Obstetrics and Gynecology

## 2010-11-14 ENCOUNTER — Encounter (HOSPITAL_COMMUNITY): Payer: Self-pay

## 2010-11-14 ENCOUNTER — Encounter (HOSPITAL_COMMUNITY): Payer: Self-pay | Admitting: *Deleted

## 2010-11-14 ENCOUNTER — Inpatient Hospital Stay (HOSPITAL_COMMUNITY)
Admission: AD | Admit: 2010-11-14 | Discharge: 2010-11-17 | DRG: 766 | Disposition: A | Discharge status: Home / Self Care | Payer: Medicaid Other | Source: Ambulatory Visit | Attending: Obstetrics and Gynecology | Admitting: Obstetrics and Gynecology

## 2010-11-14 ENCOUNTER — Inpatient Hospital Stay (HOSPITAL_COMMUNITY): Payer: Medicaid Other

## 2010-11-14 ENCOUNTER — Encounter (HOSPITAL_COMMUNITY): Payer: Self-pay | Admitting: Anesthesiology

## 2010-11-14 DIAGNOSIS — O34219 Maternal care for unspecified type scar from previous cesarean delivery: Secondary | ICD-10-CM | POA: Diagnosis present

## 2010-11-14 DIAGNOSIS — D573 Sickle-cell trait: Secondary | ICD-10-CM

## 2010-11-14 DIAGNOSIS — O9902 Anemia complicating childbirth: Secondary | ICD-10-CM | POA: Diagnosis present

## 2010-11-14 DIAGNOSIS — R894 Abnormal immunological findings in specimens from other organs, systems and tissues: Secondary | ICD-10-CM | POA: Diagnosis present

## 2010-11-14 DIAGNOSIS — Z302 Encounter for sterilization: Secondary | ICD-10-CM

## 2010-11-14 DIAGNOSIS — O9912 Other diseases of the blood and blood-forming organs and certain disorders involving the immune mechanism complicating childbirth: Principal | ICD-10-CM | POA: Diagnosis present

## 2010-11-14 DIAGNOSIS — Z98891 History of uterine scar from previous surgery: Secondary | ICD-10-CM

## 2010-11-14 DIAGNOSIS — D689 Coagulation defect, unspecified: Principal | ICD-10-CM | POA: Diagnosis present

## 2010-11-14 HISTORY — PX: TUBAL LIGATION: SHX77

## 2010-11-14 LAB — CBC
HCT: 27 % — ABNORMAL LOW (ref 36.0–46.0)
Hemoglobin: 9.4 g/dL — ABNORMAL LOW (ref 12.0–15.0)
MCHC: 34.8 g/dL (ref 30.0–36.0)
WBC: 5.3 10*3/uL (ref 4.0–10.5)

## 2010-11-14 LAB — PROTIME-INR
INR: 1.01 (ref 0.00–1.49)
Prothrombin Time: 13.5 seconds (ref 11.6–15.2)

## 2010-11-14 LAB — APTT: aPTT: 31 seconds (ref 24–37)

## 2010-11-14 SURGERY — Surgical Case
Anesthesia: Regional | Site: Abdomen | Wound class: Clean Contaminated

## 2010-11-14 MED ORDER — CITRIC ACID-SODIUM CITRATE 334-500 MG/5ML PO SOLN
ORAL | Status: AC
  Administered 2010-11-14: 30 mL via ORAL
  Filled 2010-11-14: qty 15

## 2010-11-14 MED ORDER — FAMOTIDINE IN NACL 20-0.9 MG/50ML-% IV SOLN
INTRAVENOUS | Status: AC
  Administered 2010-11-14: 20 mg via INTRAVENOUS
  Filled 2010-11-14: qty 50

## 2010-11-14 MED ORDER — IBUPROFEN 200 MG PO TABS: 200.0000 mg | ORAL_TABLET | Freq: Four times a day (QID) | ORAL | Status: DC | PRN

## 2010-11-14 MED ORDER — ONDANSETRON HCL 4 MG/2ML IJ SOLN: 4.0000 mg | Freq: Three times a day (TID) | INTRAMUSCULAR | Status: DC | PRN

## 2010-11-14 MED ORDER — CITRIC ACID-SODIUM CITRATE 334-500 MG/5ML PO SOLN
30.0000 mL | Freq: Once | ORAL | Status: AC
  Administered 2010-11-14: 30 mL via ORAL

## 2010-11-14 MED ORDER — DIPHENHYDRAMINE HCL 50 MG/ML IJ SOLN: 25.0000 mg | INTRAMUSCULAR | Status: DC | PRN

## 2010-11-14 MED ORDER — KETOROLAC TROMETHAMINE 60 MG/2ML IM SOLN
60.0000 mg | Freq: Once | INTRAMUSCULAR | Status: AC | PRN
  Administered 2010-11-14: 60 mg via INTRAMUSCULAR
  Filled 2010-11-14: qty 2

## 2010-11-14 MED ORDER — NALBUPHINE HCL 10 MG/ML IJ SOLN
5.0000 mg | INTRAMUSCULAR | Status: AC | PRN
  Filled 2010-11-14 (×2): qty 1

## 2010-11-14 MED ORDER — HYDROMORPHONE HCL 1 MG/ML IJ SOLN: 0.2500 mg | INTRAMUSCULAR | Status: DC | PRN

## 2010-11-14 MED ORDER — MEPERIDINE HCL 25 MG/ML IJ SOLN: 6.2500 mg | INTRAMUSCULAR | Status: DC | PRN

## 2010-11-14 MED ORDER — FAMOTIDINE IN NACL 20-0.9 MG/50ML-% IV SOLN
20.0000 mg | Freq: Once | INTRAVENOUS | Status: AC
  Administered 2010-11-14: 20 mg via INTRAVENOUS

## 2010-11-14 MED ORDER — PRENATAL PLUS 27-1 MG PO TABS
1.0000 | ORAL_TABLET | Freq: Every day | ORAL | Status: DC
  Administered 2010-11-15 – 2010-11-17 (×3): 1 via ORAL
  Filled 2010-11-14 (×3): qty 1

## 2010-11-14 MED ORDER — WITCH HAZEL-GLYCERIN EX PADS: MEDICATED_PAD | CUTANEOUS | Status: DC | PRN

## 2010-11-14 MED ORDER — TETANUS-DIPHTH-ACELL PERTUSSIS 5-2.5-18.5 LF-MCG/0.5 IM SUSP
0.5000 mL | Freq: Once | INTRAMUSCULAR | Status: AC
  Administered 2010-11-15: 0.5 mL via INTRAMUSCULAR
  Filled 2010-11-14: qty 0.5

## 2010-11-14 MED ORDER — SODIUM CHLORIDE 0.9 % IR SOLN
Status: DC | PRN
  Administered 2010-11-14: 1000 mL

## 2010-11-14 MED ORDER — ZOLPIDEM TARTRATE 5 MG PO TABS: 5.0000 mg | ORAL_TABLET | Freq: Every evening | ORAL | Status: DC | PRN

## 2010-11-14 MED ORDER — ONDANSETRON HCL 4 MG/2ML IJ SOLN
INTRAMUSCULAR | Status: DC | PRN
  Administered 2010-11-14: 4 mg via INTRAVENOUS

## 2010-11-14 MED ORDER — SODIUM CHLORIDE 0.9 % IJ SOLN: 3.0000 mL | INTRAMUSCULAR | Status: DC | PRN

## 2010-11-14 MED ORDER — KETOROLAC TROMETHAMINE 30 MG/ML IJ SOLN: 15.0000 mg | Freq: Once | INTRAMUSCULAR | Status: DC | PRN

## 2010-11-14 MED ORDER — NALBUPHINE HCL 10 MG/ML IJ SOLN
5.0000 mg | INTRAMUSCULAR | Status: AC | PRN
  Filled 2010-11-14: qty 1

## 2010-11-14 MED ORDER — SCOPOLAMINE 1 MG/3DAYS TD PT72
1.0000 | MEDICATED_PATCH | Freq: Once | TRANSDERMAL | Status: DC
  Filled 2010-11-14: qty 1

## 2010-11-14 MED ORDER — SIMETHICONE 80 MG PO CHEW: 80.0000 mg | CHEWABLE_TABLET | ORAL | Status: DC | PRN

## 2010-11-14 MED ORDER — AMOXICILLIN 500 MG PO CAPS
500.0000 mg | ORAL_CAPSULE | Freq: Four times a day (QID) | ORAL | Status: DC
  Administered 2010-11-14 – 2010-11-17 (×12): 500 mg via ORAL
  Filled 2010-11-14 (×17): qty 1

## 2010-11-14 MED ORDER — SCOPOLAMINE 1 MG/3DAYS TD PT72
1.0000 | MEDICATED_PATCH | Freq: Once | TRANSDERMAL | Status: AC
  Administered 2010-11-14: 1.5 mg via TRANSDERMAL
  Filled 2010-11-14: qty 1

## 2010-11-14 MED ORDER — MENTHOL 3 MG MT LOZG: 1.0000 | LOZENGE | OROMUCOSAL | Status: DC | PRN

## 2010-11-14 MED ORDER — SODIUM CHLORIDE 0.9 % IV SOLN
1.0000 ug/kg/h | INTRAVENOUS | Status: DC | PRN
  Filled 2010-11-14: qty 2.5

## 2010-11-14 MED ORDER — HYDROMORPHONE HCL 1 MG/ML IJ SOLN
INTRAMUSCULAR | Status: AC
  Administered 2010-11-14: 0.25 mg via INTRAVENOUS
  Filled 2010-11-14: qty 1

## 2010-11-14 MED ORDER — KETOROLAC TROMETHAMINE 60 MG/2ML IM SOLN
INTRAMUSCULAR | Status: AC
  Administered 2010-11-14: 60 mg via INTRAMUSCULAR
  Filled 2010-11-14: qty 2

## 2010-11-14 MED ORDER — CEFAZOLIN SODIUM-DEXTROSE 2-3 GM-% IV SOLR
2.0000 g | INTRAVENOUS | Status: AC
  Administered 2010-11-14: 2 g via INTRAVENOUS
  Filled 2010-11-14: qty 50

## 2010-11-14 MED ORDER — DIPHENHYDRAMINE HCL 25 MG PO CAPS
25.0000 mg | ORAL_CAPSULE | Freq: Four times a day (QID) | ORAL | Status: DC | PRN
  Administered 2010-11-14: 25 mg via ORAL
  Filled 2010-11-14: qty 1

## 2010-11-14 MED ORDER — BUPIVACAINE HCL 0.5 % IJ SOLN
INTRAMUSCULAR | Status: DC | PRN
  Administered 2010-11-14: 30 mL

## 2010-11-14 MED ORDER — NALOXONE HCL 0.4 MG/ML IJ SOLN: 0.4000 mg | INTRAMUSCULAR | Status: DC | PRN

## 2010-11-14 MED ORDER — DIPHENHYDRAMINE HCL 50 MG/ML IJ SOLN: 12.5000 mg | INTRAMUSCULAR | Status: DC | PRN

## 2010-11-14 MED ORDER — LACTATED RINGERS IV SOLN
INTRAVENOUS | Status: DC
  Administered 2010-11-14: 18:00:00 via INTRAVENOUS

## 2010-11-14 MED ORDER — EPINEPHRINE HCL 0.1 MG/ML IJ SOLN: INTRAMUSCULAR | Status: DC | PRN

## 2010-11-14 MED ORDER — KETOROLAC TROMETHAMINE 60 MG/2ML IM SOLN
60.0000 mg | Freq: Once | INTRAMUSCULAR | Status: DC | PRN
  Filled 2010-11-14: qty 2

## 2010-11-14 MED ORDER — OXYTOCIN 20 UNITS IN LACTATED RINGERS INFUSION - SIMPLE
125.0000 mL/h | INTRAVENOUS | Status: DC
  Administered 2010-11-14: 125 mL/h via INTRAVENOUS

## 2010-11-14 MED ORDER — IBUPROFEN 600 MG PO TABS
600.0000 mg | ORAL_TABLET | Freq: Four times a day (QID) | ORAL | Status: DC
  Administered 2010-11-14: 600 mg via ORAL
  Filled 2010-11-14: qty 1

## 2010-11-14 MED ORDER — ENOXAPARIN SODIUM 40 MG/0.4ML ~~LOC~~ SOLN
40.0000 mg | Freq: Every day | SUBCUTANEOUS | Status: DC
  Administered 2010-11-14 – 2010-11-16 (×3): 40 mg via SUBCUTANEOUS
  Filled 2010-11-14 (×4): qty 0.4

## 2010-11-14 MED ORDER — OXYTOCIN 20 UNITS IN LACTATED RINGERS INFUSION - SIMPLE
INTRAVENOUS | Status: DC | PRN
  Administered 2010-11-14 (×2): 20 [IU] via INTRAVENOUS

## 2010-11-14 MED ORDER — FENTANYL CITRATE 0.05 MG/ML IJ SOLN
INTRAMUSCULAR | Status: DC | PRN
  Administered 2010-11-14 (×3): 25 ug via INTRAVENOUS

## 2010-11-14 MED ORDER — OXYCODONE-ACETAMINOPHEN 5-325 MG PO TABS
1.0000 | ORAL_TABLET | ORAL | Status: DC | PRN
  Administered 2010-11-15 – 2010-11-16 (×3): 1 via ORAL
  Filled 2010-11-14 (×3): qty 1

## 2010-11-14 MED ORDER — DIPHENHYDRAMINE HCL 25 MG PO CAPS: 25.0000 mg | ORAL_CAPSULE | ORAL | Status: DC | PRN

## 2010-11-14 MED ORDER — LACTATED RINGERS IV SOLN
INTRAVENOUS | Status: DC
  Administered 2010-11-14 (×4): via INTRAVENOUS

## 2010-11-14 MED ORDER — NALBUPHINE HCL 10 MG/ML IJ SOLN
5.0000 mg | INTRAMUSCULAR | Status: AC | PRN
  Administered 2010-11-15: 10 mg via SUBCUTANEOUS
  Filled 2010-11-14: qty 1

## 2010-11-14 MED ORDER — HYDROMORPHONE HCL 1 MG/ML IJ SOLN
0.5000 mg | INTRAMUSCULAR | Status: DC | PRN
  Administered 2010-11-14 (×2): 0.25 mg via INTRAVENOUS

## 2010-11-14 MED ORDER — SCOPOLAMINE 1 MG/3DAYS TD PT72
MEDICATED_PATCH | TRANSDERMAL | Status: AC
  Administered 2010-11-14: 1.5 mg via TRANSDERMAL
  Filled 2010-11-14: qty 1

## 2010-11-14 MED ORDER — OXYTOCIN 20 UNITS IN LACTATED RINGERS INFUSION - SIMPLE
INTRAVENOUS | Status: AC
  Administered 2010-11-14: 125 mL/h via INTRAVENOUS
  Filled 2010-11-14: qty 1000

## 2010-11-14 MED ORDER — ONDANSETRON HCL 4 MG/2ML IJ SOLN: 4.0000 mg | Freq: Once | INTRAMUSCULAR | Status: DC | PRN

## 2010-11-14 MED ORDER — EPHEDRINE SULFATE 50 MG/ML IJ SOLN
INTRAMUSCULAR | Status: DC | PRN
  Administered 2010-11-14 (×2): 10 mg via INTRAVENOUS
  Administered 2010-11-14: 30 mg via INTRAVENOUS
  Administered 2010-11-14: 10 mg via INTRAVENOUS

## 2010-11-14 MED ORDER — SENNOSIDES-DOCUSATE SODIUM 8.6-50 MG PO TABS
1.0000 | ORAL_TABLET | Freq: Every day | ORAL | Status: DC
  Administered 2010-11-14 – 2010-11-15 (×2): 1 via ORAL
  Administered 2010-11-16: 2 via ORAL

## 2010-11-14 MED ORDER — SIMETHICONE 80 MG PO CHEW
80.0000 mg | CHEWABLE_TABLET | Freq: Three times a day (TID) | ORAL | Status: DC
  Administered 2010-11-14 – 2010-11-17 (×11): 80 mg via ORAL

## 2010-11-14 SURGICAL SUPPLY — 38 items
CHLORAPREP W/TINT 26ML (MISCELLANEOUS) ×2 IMPLANT
CLIP FILSHIE TUBAL LIGA STRL (Clip) ×1 IMPLANT
CLOTH BEACON ORANGE TIMEOUT ST (SAFETY) ×2 IMPLANT
CONTAINER PREFILL 10% NBF 15ML (MISCELLANEOUS) IMPLANT
DRAPE UTILITY XL STRL (DRAPES) ×1 IMPLANT
DRESSING PAD ABDOMINAL 8X7.5" (GAUZE/BANDAGES/DRESSINGS) ×1 IMPLANT
DRESSING TELFA 8X3 (GAUZE/BANDAGES/DRESSINGS) IMPLANT
DRSG COVADERM 4X10 (GAUZE/BANDAGES/DRESSINGS) ×1 IMPLANT
ELECT REM PT RETURN 9FT ADLT (ELECTROSURGICAL) ×2
ELECTRODE REM PT RTRN 9FT ADLT (ELECTROSURGICAL) ×1 IMPLANT
GAUZE SPONGE 4X4 12PLY STRL LF (GAUZE/BANDAGES/DRESSINGS) IMPLANT
GLOVE BIO SURGEON STRL SZ 6.5 (GLOVE) ×4 IMPLANT
GOWN BRE IMP SLV AUR LG STRL (GOWN DISPOSABLE) ×6 IMPLANT
KIT ABG SYR 3ML LUER SLIP (SYRINGE) IMPLANT
NDL HYPO 25X5/8 SAFETYGLIDE (NEEDLE) IMPLANT
NDL SPNL 18GX3.5 QUINCKE PK (NEEDLE) ×1 IMPLANT
NEEDLE HYPO 25X5/8 SAFETYGLIDE (NEEDLE) IMPLANT
NEEDLE SPNL 18GX3.5 QUINCKE PK (NEEDLE) ×2 IMPLANT
NS IRRIG 1000ML POUR BTL (IV SOLUTION) ×2 IMPLANT
PACK C SECTION WH (CUSTOM PROCEDURE TRAY) ×2 IMPLANT
PAD ABD 7.5X8 STRL (GAUZE/BANDAGES/DRESSINGS) ×1 IMPLANT
SLEEVE SCD COMPRESS KNEE MED (MISCELLANEOUS) ×1 IMPLANT
SUT PDS AB 0 CTX 60 (SUTURE) ×1 IMPLANT
SUT VIC AB 0 CT1 27 (SUTURE)
SUT VIC AB 0 CT1 27XBRD ANBCTR (SUTURE) IMPLANT
SUT VIC AB 0 CT1 36 (SUTURE) IMPLANT
SUT VIC AB 2-0 CT1 27 (SUTURE) ×2
SUT VIC AB 2-0 CT1 TAPERPNT 27 (SUTURE) ×1 IMPLANT
SUT VIC AB 2-0 CTX 36 (SUTURE) ×5 IMPLANT
SUT VIC AB 3-0 CT1 27 (SUTURE) ×2
SUT VIC AB 3-0 CT1 TAPERPNT 27 (SUTURE) ×1 IMPLANT
SUT VIC AB 3-0 SH 27 (SUTURE)
SUT VIC AB 3-0 SH 27X BRD (SUTURE) IMPLANT
SYR 30ML LL (SYRINGE) ×2 IMPLANT
TAPE CLOTH SURG 4X10 WHT LF (GAUZE/BANDAGES/DRESSINGS) ×1 IMPLANT
TOWEL OR 17X24 6PK STRL BLUE (TOWEL DISPOSABLE) ×3 IMPLANT
TRAY FOLEY CATH 14FR (SET/KITS/TRAYS/PACK) ×2 IMPLANT
WATER STERILE IRR 1000ML POUR (IV SOLUTION) ×2 IMPLANT

## 2010-11-14 NOTE — Progress Notes (Signed)
Pt sent home last night from birthing suites, scheduled to come back in the Am for c/s

## 2010-11-14 NOTE — OR Nursing (Signed)
Uterus massaged by S. Carrolyn Hilmes Charity fundraiser. Cord blood x 2 sent to lab.

## 2010-11-14 NOTE — Transfer of Care (Signed)
Immediate Anesthesia Transfer of Care Note  Patient: Latoya Guzman  Procedure(s) Performed:  CESAREAN SECTION - Repeat cesarean section with delivery of baby boy at 1000. Apgars 9/9. Bilateral tubal ligation with filshie clips.   Patient Location: PACU  Anesthesia Type: Spinal  Level of Consciousness: awake, alert  and oriented  Airway & Oxygen Therapy: Patient Spontanous Breathing  Post-op Assessment: Report given to PACU RN and Post -op Vital signs reviewed and stable  Post vital signs: Reviewed and stable  Complications: No apparent anesthesia complications

## 2010-11-14 NOTE — OR Nursing (Signed)
FHT's obtained in OR following spinal. FHT's in 130's. Tracing did not show in [email or web address]

## 2010-11-14 NOTE — Consult Note (Signed)
Delivery Note   11/14/2010  9:52 AM  Requested by Dr.  Marice Potter  to attend this repeat C-section at 39 1/[redacted] weeks gestation.   Born to a 35  y/o G10P7 mother with PNC B+Ab- and negative screens.          Prenatal problems included  (+) lupus anticoagulant on Lovenox and sickle cell trait.   AROM  at delivery with clear fluid.          The c/section delivery was uncomplicated otherwise. Loose body cord noted at delivery.   Infant handed to Neo crying.  Dried, bulb suctioned and kept warm.  APGAR 9 and 9.  Care transfer to Peds. Teaching service.    Chales Abrahams V.T. Maicy Filip, MD Neonatologist

## 2010-11-14 NOTE — OR Nursing (Signed)
Gelfoam placed in incision by Dr. Elsie Ra.

## 2010-11-14 NOTE — Anesthesia Procedure Notes (Addendum)
Spinal Block  Patient location during procedure: OR Start time: 11/14/2010 9:29 AM End time: 11/14/2010 9:36 AM Staffing Anesthesiologist: Sandrea Hughs Performed by: anesthesiologist  Preanesthetic Checklist Completed: patient identified, site marked, surgical consent, pre-op evaluation, timeout performed, IV checked, risks and benefits discussed and monitors and equipment checked Spinal Block Patient position: sitting Prep: DuraPrep Patient monitoring: heart rate, cardiac monitor, continuous pulse ox and blood pressure Approach: midline Location: L3-4 Injection technique: single-shot Needle Needle type: Pencan  Needle gauge: 24 G Needle length: 9 cm Needle insertion depth: 5 cm Assessment Sensory level: T4 Additional Notes No complications

## 2010-11-14 NOTE — Procedures (Deleted)
Cesarean Section Procedure Note with Bilateral Tubal Ligation with Filshie Clips  Indications: Desired repeat, Undesired fertility.  Pre-operative Diagnosis: 39 week 1 day pregnancy of a   Post-operative Diagnosis: same  Surgeon: Nicholaus Bloom, MD Assistants: Lucina Mellow, DO  Anesthesia: Spinal anesthesia  Procedure Details  The patient was seen in the Holding Room. The risks, benefits, complications, treatment options, and expected outcomes were discussed with the patient.  The patient concurred with the proposed plan, giving informed consent.  The patient was taken to Operating Room, identified as Latoya Guzman and the procedure verified as C-Section Delivery. A Time Out was held and the above information confirmed.  After induction of anesthesia, the patient was draped and prepped in the usual sterile manner. A Pfannenstiel incision was made and carried down through the subcutaneous tissue to the fascia. Fascial incision was made and extended transversely. The rectus muscles were cut transversely with the bovie.  The peritoneum was identified and entered.Bladder blade was place and a low transverse uterine incision was made. Delivered from vtx presentation was a  Viable female infant with Apgar scores of 9 at one minute and 9 at five minutes. After the umbilical cord was clamped and cut cord blood was obtained for evaluation. The placenta was removed manually intact and appeared normal. The uterine outline, tubes and ovaries appeared normal. The uterine incision was closed with running locked sutures of 2 Vicryl. A second layer of the same was made. Hemostasis was observed.   Attention was then turned to the patient's RT ovary and fallopian tube. The fallopian tube was grasped with the Babcock clamp and a Filshie Clip was placed in the isthmic region of the fallopian tube. The tube was returned to the abdomen. In a similar fashion, the Left ovary and fallopian tube were identified, and a  Babcock clamp was placed on the fallopian tube. A Filshie Clip was paced in the isthmic region of the fallopian tube. The tube was returned to the abdomen.    The fascia was then reapproximated with running sutures of PDS on a looped needle.. The skin was reapproximated with suture.  Instrument, sponge, and needle counts were correct prior the abdominal closure and at the conclusion of the case. Ancef 1 gram was given to the patient prior to the start of the procedure. The patient was taken to the PACU in stable condition.  Findings: Viable infant female in vertex position, weight 8 pounds 6.2 ounces, normal uterus, placenta, tubes and ovaries.  Estimated Blood Loss:  800 mL         Total IV Fluids: 2300 mL Urine output: , clear         Specimens: placenta to L&D         Complications:  None; patient tolerated the procedure well.         Disposition: PACU - hemodynamically stable.         Condition: stable

## 2010-11-14 NOTE — Anesthesia Postprocedure Evaluation (Signed)
Vital signs stable Patient alert Pain and nausea are controlled No apparent anesthetic complications No follow up care needed Pt may be d/c when nm fxn returns 

## 2010-11-14 NOTE — Anesthesia Preprocedure Evaluation (Addendum)
Anesthesia Evaluation  Name, MR# and DOB Patient awake  General Assessment Comment  Reviewed: Allergy & Precautions, H&P  and Patient's Chart, lab work & pertinent test results  Airway Mallampati: II TM Distance: >3 FB Neck ROM: full    Dental No notable dental hx    Pulmonaryneg pulmonary ROS    clear to auscultation  pulmonary exam normal   Cardiovascular regular Normal   Neuro/PsychNegative Neurological ROS   GI/Hepatic/Renal negative GI ROS, negative Liver ROS, and negative Renal ROS (+)       Endo/Other  Negative Endocrine ROS (+)   Abdominal   Musculoskeletal  Hematology negative hematology ROS (+)   Peds  Reproductive/Obstetrics (+) Pregnancy   Anesthesia Other Findings             Anesthesia Physical Anesthesia Plan  ASA: II  Anesthesia Plan: Spinal   Post-op Pain Management:    Induction:   Airway Management Planned:   Additional Equipment:   Intra-op Plan:   Post-operative Plan:   Informed Consent: I have reviewed the patients History and Physical, chart, labs and discussed the procedure including the risks, benefits and alternatives for the proposed anesthesia with the patient or authorized representative who has indicated his/her understanding and acceptance.     Plan Discussed with: CRNA  Anesthesia Plan Comments:         Anesthesia Quick Evaluation

## 2010-11-15 LAB — CBC
HCT: 21.3 % — ABNORMAL LOW (ref 36.0–46.0)
MCH: 26 pg (ref 26.0–34.0)
MCV: 74.7 fL — ABNORMAL LOW (ref 78.0–100.0)
RBC: 2.85 MIL/uL — ABNORMAL LOW (ref 3.87–5.11)
WBC: 7.8 10*3/uL (ref 4.0–10.5)

## 2010-11-15 MED ORDER — IBUPROFEN 800 MG PO TABS
800.0000 mg | ORAL_TABLET | Freq: Three times a day (TID) | ORAL | Status: DC
  Administered 2010-11-15 – 2010-11-17 (×7): 800 mg via ORAL
  Filled 2010-11-15 (×7): qty 1

## 2010-11-15 MED ORDER — GENTAMICIN IN SALINE 1-0.9 MG/ML-% IV SOLN
100.0000 mg | Freq: Once | INTRAVENOUS | Status: DC
  Filled 2010-11-15: qty 100

## 2010-11-15 NOTE — Procedures (Signed)
Cesarean Section Procedure Note with Bilateral Tubal Ligation with Filshie Clips  Indications: Desired repeat, Undesired fertility.  Pre-operative Diagnosis: 39 week 1 day pregnancy of a 34-yo [id number]  Post-operative Diagnosis: same  Surgeon: Nicholaus Bloom, MD Assistants: Lucina Mellow, DO  Anesthesia: Spinal anesthesia  Procedure Details  The patient was seen in the Holding Room. The risks, benefits, complications, treatment options, and expected outcomes were discussed with the patient.  The patient concurred with the proposed plan, giving informed consent.  The patient was taken to Operating Room, identified as Latoya Guzman and the procedure verified as C-Section Delivery. A Time Out was held and the above information confirmed.  After induction of anesthesia, the patient was draped and prepped in the usual sterile manner. A Pfannenstiel incision was made and carried down through the subcutaneous tissue to the fascia. Fascial incision was made and extended transversely. The rectus muscles were cut transversely with the bovie less than 50% of the muscle width.  The peritoneum was identified and entered.Bladder blade was place and a low transverse uterine incision was made. Delivered from vtx presentation was a  Viable female infant with Apgar scores of 9 at one minute and 9 at five minutes. After the umbilical cord was clamped and cut cord blood was obtained for evaluation. The placenta was removed manually intact and appeared normal. The uterine outline, tubes and ovaries appeared normal. The uterine incision was closed with running locked sutures of 2 Vicryl. A second layer of the same was made. Hemostasis was observed.   Attention was then turned to the patient's RT ovary and fallopian tube. The fallopian tube was grasped with the Babcock clamp and a Filshie Clip was placed in the isthmic region of the fallopian tube. The tube was returned to the abdomen. In a similar fashion, the Left ovary  and fallopian tube were identified, and a Babcock clamp was placed on the fallopian tube. A Filshie Clip was paced in the isthmic region of the fallopian tube. The tube was returned to the abdomen.    The fascia was then reapproximated with running sutures of PDS on a looped needle.. The skin was reapproximated with suture.  Instrument, sponge, and needle counts were correct prior the abdominal closure and at the conclusion of the case. Ancef 1 gram was given to the patient prior to the start of the procedure. The patient was taken to the PACU in stable condition.  Findings: Viable infant female in vertex position, weight 8 pounds 6.2 ounces, normal uterus, placenta, tubes and ovaries.  Estimated Blood Loss:  800 mL         Total IV Fluids: 2300 mL Urine output: , clear         Specimens: placenta to L&D         Complications:  None; patient tolerated the procedure well.         Disposition: PACU - hemodynamically stable.         Condition: stable

## 2010-11-15 NOTE — Procedures (Deleted)
Cesarean Section Procedure Note with Bilateral Tubal Ligation with Filshie Clips  Indications: Desired repeat, Undesired fertility.  Pre-operative Diagnosis: 39 week 1 day pregnancy of a   Post-operative Diagnosis: same  Surgeon: Nicholaus Bloom, MD Assistants: Lucina Mellow, DO  Anesthesia: Spinal anesthesia  Procedure Details  The patient was seen in the Holding Room. The risks, benefits, complications, treatment options, and expected outcomes were discussed with the patient.  The patient concurred with the proposed plan, giving informed consent.  The patient was taken to Operating Room, identified as Latoya Guzman and the procedure verified as C-Section Delivery. A Time Out was held and the above information confirmed.  After induction of anesthesia, the patient was draped and prepped in the usual sterile manner. A Pfannenstiel incision was made and carried down through the subcutaneous tissue to the fascia. Fascial incision was made and extended transversely. The rectus muscles were cut transversely with the bovie less than 50% of the muscle width.  The peritoneum was identified and entered.Bladder blade was place and a low transverse uterine incision was made. Delivered from vtx presentation was a  Viable female infant with Apgar scores of 9 at one minute and 9 at five minutes. After the umbilical cord was clamped and cut cord blood was obtained for evaluation. The placenta was removed manually intact and appeared normal. The uterine outline, tubes and ovaries appeared normal. The uterine incision was closed with running locked sutures of 2 Vicryl. A second layer of the same was made. Hemostasis was observed.   Attention was then turned to the patient's RT ovary and fallopian tube. The fallopian tube was grasped with the Babcock clamp and a Filshie Clip was placed in the isthmic region of the fallopian tube. The tube was returned to the abdomen. In a similar fashion, the Left ovary and fallopian  tube were identified, and a Babcock clamp was placed on the fallopian tube. A Filshie Clip was paced in the isthmic region of the fallopian tube. The tube was returned to the abdomen.    The fascia was then reapproximated with running sutures of PDS on a looped needle.. The skin was reapproximated with suture.  Instrument, sponge, and needle counts were correct prior the abdominal closure and at the conclusion of the case. Ancef 1 gram was given to the patient prior to the start of the procedure. The patient was taken to the PACU in stable condition.  Findings: Viable infant female in vertex position, weight 8 pounds 6.2 ounces, normal uterus, placenta, tubes and ovaries.  Estimated Blood Loss:  800 mL         Total IV Fluids: 2300 mL Urine output: , clear         Specimens: placenta to L&D         Complications:  None; patient tolerated the procedure well.         Disposition: PACU - hemodynamically stable.         Condition: stable

## 2010-11-15 NOTE — Progress Notes (Signed)
Mom reports some tenderness with breastfeeding, reports left breast more tender and has not put baby on this breast today. Has given a few bottles yesterday, today all breast. On exam, no breakdown noted on breast nipples. Assisted mom with deep latch on left breast. Baby nursed for 10 minutes and mom tolerated well. Has lanolin for comfort, comfort gels given. Lactation brochure reviewed with mom, community resources for breastfeeding mothers discussed, advised of outpatient services if needed. Enc to continue breast, decrease bottles unless medically necessary, engorgement care reviewed if needed. Call for assistance as needed.

## 2010-11-15 NOTE — Progress Notes (Signed)
Subjective: Postpartum Day 1: Cesarean Delivery Patient reports incisional pain, tolerating PO and no problems voiding.  She would like to use IBU for first line pain treatment.  Objective: Vital signs in last 24 hours: Temp:  [97.3 F (36.3 C)-98.4 F (36.9 C)] 97.6 F (36.4 C) (07/22 0400) Pulse Rate:  [62-91] 76  (07/22 0400) Resp:  [13-24] 20  (07/22 0400) BP: (96-119)/(51-71) 102/65 mmHg (07/22 0200) SpO2:  [97 %-100 %] 100 % (07/22 0400) Weight:  [109.498 kg (241 lb 6.4 oz)] 241 lb 6.4 oz (109.498 kg) (07/21 0851)  Physical Exam:  General: alert Lochia: appropriate Uterine Fundus: firm Incision: healing well DVT Evaluation: No evidence of DVT seen on physical exam.   Basename 11/15/10 0513 11/14/10 0815  HGB 7.4* 9.4*  HCT 21.3* 27.0*    Assessment/Plan: Status post Cesarean section. Doing well.  Continue current care.  Talik Casique C. 11/15/2010, 7:40 AM

## 2010-11-16 NOTE — Progress Notes (Signed)
UR chart review completed.  

## 2010-11-16 NOTE — Progress Notes (Signed)
Subjective: Postpartum Day 2: Cesarean Delivery Patient reports incisional pain, tolerating PO and no problems voiding.    Objective: Vital signs in last 24 hours: Temp:  [97.9 F (36.6 C)-98.2 F (36.8 C)] 98.2 F (36.8 C) (07/23 0556) Pulse Rate:  [75-97] 78  (07/23 0556) Resp:  [16-20] 18  (07/23 0556) BP: (101-108)/(61-65) 104/64 mmHg (07/23 0556) SpO2:  [98 %-100 %] 100 % (07/22 1000)  Physical Exam:  General: alert, cooperative and appears stated age 35: appropriate Uterine Fundus: firm Incision: healing well, no significant drainage, no dehiscence, no significant erythema DVT Evaluation: No evidence of DVT seen on physical exam. Negative Homan's sign. No cords or calf tenderness. No significant calf/ankle edema.   Basename 11/15/10 0513 11/14/10 0815  HGB 7.4* 9.4*  HCT 21.3* 27.0*    Assessment/Plan: Status post Cesarean section. Doing well postoperatively.  Continue current care.  Zerita Boers 11/16/2010, 6:33 AM

## 2010-11-17 DIAGNOSIS — Z98891 History of uterine scar from previous surgery: Secondary | ICD-10-CM

## 2010-11-17 LAB — TYPE AND SCREEN
ABO/RH(D): B POS
Antibody Screen: NEGATIVE
Unit division: 0

## 2010-11-17 MED ORDER — WARFARIN SODIUM 5 MG PO TABS: 5.0000 mg | ORAL_TABLET | Freq: Every day | ORAL | Status: DC

## 2010-11-17 MED ORDER — OXYCODONE-ACETAMINOPHEN 5-325 MG PO TABS: 1.0000 | ORAL_TABLET | ORAL | Status: AC | PRN

## 2010-11-17 MED ORDER — FERROUS SULFATE 325 (65 FE) MG PO TABS: 325.0000 mg | ORAL_TABLET | Freq: Two times a day (BID) | ORAL | Status: DC

## 2010-11-17 MED ORDER — DOCUSATE SODIUM 100 MG PO CAPS: 100.0000 mg | ORAL_CAPSULE | Freq: Two times a day (BID) | ORAL | Status: AC

## 2010-11-17 MED ORDER — BISACODYL 10 MG RE SUPP
10.0000 mg | Freq: Once | RECTAL | Status: AC
  Administered 2010-11-17: 10 mg via RECTAL
  Filled 2010-11-17: qty 1

## 2010-11-17 MED ORDER — IBUPROFEN 800 MG PO TABS: 800.0000 mg | ORAL_TABLET | Freq: Three times a day (TID) | ORAL | Status: AC

## 2010-11-17 NOTE — Progress Notes (Signed)
  Subjective: Postpartum Day 3: Cesarean Delivery Patient reports tolerating PO, + flatus and no problems voiding.  Abdominal cramping controled with motrin and percocet. Breastfeeding and formula feeding.   Objective: Vital signs in last 24 hours: Temp:  [97.4 F (36.3 C)-98.3 F (36.8 C)] 97.4 F (36.3 C) (07/24 0544) Pulse Rate:  [80-84] 80  (07/24 0544) Resp:  [16-18] 18  (07/24 0544) BP: (98-109)/(55-70) 102/65 mmHg (07/24 0544) SpO2:  [98 %] 98 % (07/23 2135)  Physical Exam:  General: alert, cooperative and no distress Lochia: appropriate Uterine Fundus: firm, at umbilicus Incision: healing well, no significant drainage, no dehiscence, no significant erythema DVT Evaluation: No evidence of DVT seen on physical exam. Negative Homan's sign.   Basename 11/15/10 0513  HGB 7.4*  HCT 21.3*    Assessment/Plan: Postop day 3 sp RLTCS and BTL Status post Cesarean section. Doing well postoperatively.  1. Antiphopholipid: discharge patient with appointment to get coumadin levels. Pt will be discharged on lovenox 40mg  and coumadin 5 per day.  2. Breastfeeding and bottle feeding. 3. Postpartum appointment in 4 weeks.  Marena Chancy 11/17/2010, 9:57 AM

## 2010-11-17 NOTE — Discharge Summary (Signed)
  Obstetric Discharge Summary Reason for Admission: cesarean section Prenatal Procedures: ultrasound Preadmission Diagnosis: lupus anticoagulant on Lovenox 40mg  during pregnancy, sickle cell trait, tricuspid and mitral valve regurgitation, h/o depression: on lexapro before pregnancy, h/o preterm labor in previous 2 pregnancies. Intrapartum Procedures: cesarean: low cervical, transverse and BTL. Pt had h/o previous cesarian section and elected for repeat cesarian section. Pt AROM at delivery. Pt tolerated procedure well. Continued on lovenox 40mg  per day postpartum. Postpartum Procedures: none Complications-Operative and Postpartum: none  HGB  Date Value Range Status  04/28/2010 11.0* 11.6-15.9 (g/dL) Final     Hemoglobin  Date Value Range Status  11/15/2010 7.4* 12.0-15.0 (g/dL) Final     DELTA CHECK NOTED     REPEATED TO VERIFY     HCT  Date Value Range Status  11/15/2010 21.3* 36.0-46.0 (%) Final  04/28/2010 31.9* 34.8-46.6 (%) Final    Discharge Diagnoses: Term Pregnancy-delivered  Discharge Information: Date: 11/17/2010 Activity: pelvic rest Diet: routine Medications: Ibuprophen, Colace, Iron, Percocet and lovenox 40mg  and coumadin 5. Resume amoxicillin for dental infection.  Condition: stable Instructions: refer to practice specific booklet Follow-up appointment: pt to follow up with [facility] on Thursday July 26th for bridge from lovenox to coumadin with therapeutic INR (2-3). Discharge to: home   Newborn Data: Live born  Information for the patient's newborn:  Mari[place]ula, Krist [[id number]]  female ; APGAR 9 ,9 ; weight 8lbs 6.2oz ;  Loose cord around baby Home with mother.  Marena Chancy 11/17/2010, 10:07 AM

## 2010-11-17 NOTE — Progress Notes (Signed)
Lots of questions about pumping and bottle feeding. Encouraged mom to breast feed first and then pump,, slightly sore , has comfort gels . Encouraged deep latch. Infant took 60 ml EBM with bottle.

## 2010-11-18 ENCOUNTER — Encounter: Payer: Self-pay | Admitting: Family Medicine

## 2010-11-18 DIAGNOSIS — D6859 Other primary thrombophilia: Secondary | ICD-10-CM

## 2010-11-18 DIAGNOSIS — Z7901 Long term (current) use of anticoagulants: Secondary | ICD-10-CM | POA: Insufficient documentation

## 2010-11-19 ENCOUNTER — Ambulatory Visit: Payer: Medicaid Other

## 2010-11-19 NOTE — Anesthesia Procedure Notes (Signed)
Procedures

## 2010-11-25 ENCOUNTER — Telehealth: Payer: Self-pay | Admitting: *Deleted

## 2010-11-25 NOTE — Discharge Summary (Signed)
Agree with above note.  Dondrell Loudermilk 11/25/2010 2:15 PM   

## 2010-11-25 NOTE — Telephone Encounter (Signed)
Patient called wants copy of [place] reports. Telephoned pt no answer.

## 2010-11-26 NOTE — Telephone Encounter (Signed)
Called pt, unable to leave message.

## 2010-12-22 ENCOUNTER — Encounter (HOSPITAL_COMMUNITY): Payer: Self-pay | Admitting: *Deleted

## 2010-12-22 ENCOUNTER — Inpatient Hospital Stay (HOSPITAL_COMMUNITY)
Admission: AD | Admit: 2010-12-22 | Discharge: 2010-12-22 | Disposition: A | Discharge status: Home / Self Care | Payer: Medicaid Other | Source: Ambulatory Visit | Attending: Obstetrics & Gynecology | Admitting: Obstetrics & Gynecology

## 2010-12-22 DIAGNOSIS — IMO0002 Reserved for concepts with insufficient information to code with codable children: Secondary | ICD-10-CM | POA: Insufficient documentation

## 2010-12-22 DIAGNOSIS — D6859 Other primary thrombophilia: Secondary | ICD-10-CM | POA: Insufficient documentation

## 2010-12-22 DIAGNOSIS — O26899 Other specified pregnancy related conditions, unspecified trimester: Secondary | ICD-10-CM | POA: Insufficient documentation

## 2010-12-22 DIAGNOSIS — D573 Sickle-cell trait: Secondary | ICD-10-CM | POA: Insufficient documentation

## 2010-12-22 DIAGNOSIS — D692 Other nonthrombocytopenic purpura: Secondary | ICD-10-CM | POA: Insufficient documentation

## 2010-12-22 DIAGNOSIS — I059 Rheumatic mitral valve disease, unspecified: Secondary | ICD-10-CM | POA: Insufficient documentation

## 2010-12-22 DIAGNOSIS — O9081 Anemia of the puerperium: Secondary | ICD-10-CM | POA: Insufficient documentation

## 2010-12-22 HISTORY — DX: Systemic lupus erythematosus, unspecified: M32.9

## 2010-12-22 HISTORY — DX: Sickle-cell trait: D57.3

## 2010-12-22 LAB — COMPREHENSIVE METABOLIC PANEL
Albumin: 3.4 g/dL — ABNORMAL LOW (ref 3.5–5.2)
Alkaline Phosphatase: 92 U/L (ref 39–117)
BUN: 14 mg/dL (ref 6–23)
Chloride: 106 mEq/L (ref 96–112)
Creatinine, Ser: 1.03 mg/dL (ref 0.50–1.10)
GFR calc Af Amer: >60 mL/min (ref >60)
GFR calc non Af Amer: >60 mL/min (ref >60)
Glucose, Bld: 91 mg/dL (ref 70–99)
Potassium: 4 mEq/L (ref 3.5–5.1)
Total Bilirubin: 0.3 mg/dL (ref 0.3–1.2)

## 2010-12-22 LAB — CBC
MCHC: 33.7 g/dL (ref 30.0–36.0)
MCV: 75.2 fL — ABNORMAL LOW (ref 78.0–100.0)
Platelets: 207 10*3/uL (ref 150–400)
RDW: 16.9 % — ABNORMAL HIGH (ref 11.5–15.5)
WBC: 5.2 10*3/uL (ref 4.0–10.5)

## 2010-12-22 LAB — PROTIME-INR: Prothrombin Time: 13.9 seconds (ref 11.6–15.2)

## 2010-12-22 NOTE — ED Provider Notes (Signed)
Chief Complaint:  No chief complaint on file.   Latoya Guzman is  35 y.o. [id number] at [redacted]w[redacted]d [facility]) presents complaining of itchy painful rash on legs.  Patient states rash started yesterday morning.  It is quite itchy when fabric touches it and sensitive to sunlight.  Also complains of increased skin sensitivity.  Rash is present on bilateral lower legs.  States some swelling in left ankle this morning.  Also did have headache yesterday and today that resolved on it's own.  Yesterday the headache started suddenly and was the worst headache she'd ever had.  No headache currently.  Also had some heavy vaginal bleeding that started yesterday and is improved today.  States always has heavy periods, unsure of when she should have a period since just delivered 4 weeks ago.  Denies n/v/d, fever chills. Supposed to be taking coumadin for lupus anticoagulant, however not taking.   Obstetrical/Gynecological History: OB History    Grav Para Term Preterm Abortions TAB SAB Ect Mult Living   10 7 5 2 2  2  1 7       Past Medical History: Past Medical History  Diagnosis Date  . Blood transfusion 1998    Post C/S 2units  . Depression     Was on Lexapro Stopped when pregnant  . Preterm labor     PTL last two pregnancies  . Fibroid 03/2010    Noted on [place] in Dec.  . 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 Tricuspid valve regurgitation 02/27/2010  . Lupus Lupus Antigen     associated with pregnancy   . Sickle cell trait     Past Surgical History: Past Surgical History  Procedure Date  . Dilation and curettage of uterus 2005, 2006    1st for twin loss, 2nd for retained placenta  . Cesarean section 1998, 2009  . Tubal ligation     Family History: No family history on file.  Social History: History  Substance Use Topics  . Smoking status: Never Smoker   . Smokeless tobacco: Never Used  . Alcohol Use: No    Allergies: No Known  Allergies  Prescriptions prior to admission  Medication Sig Dispense Refill  . prenatal vitamin w/FE, FA (PRENATAL 1 + 1) 27-1 MG TABS Take 1 tablet by mouth daily.       Marland Kitchen amoxicillin (AMOXIL) 500 MG capsule Take 500 mg by mouth 4 (four) times daily.       Marland Kitchen enoxaparin (LOVENOX) 40 MG/0.4ML SOLN Inject 40 mg into the skin daily.       . ferrous sulfate (FERROUSUL) 325 (65 FE) MG tablet Take 1 tablet (325 mg total) by mouth 2 (two) times daily with a meal.  30 tablet  11  . warfarin (COUMADIN) 5 MG tablet Take 1 tablet (5 mg total) by mouth daily.  30 tablet  1    Review of Systems - Negative except as in HPI  Physical Exam   Blood pressure 138/88, pulse 62, temperature 99.1 F (37.3 C), temperature source Oral, resp. rate 20, height 5' 9.5" (1.765 m), weight 227 lb 2 oz (103.023 kg), last menstrual period 12/21/2010, currently breastfeeding.  General: General appearance - alert, well appearing, and in no distress Mouth - mucous membranes moist, pharynx normal without lesions Chest - clear to auscultation, no wheezes, rales or rhonchi, symmetric air entry Heart - normal rate, regular rhythm, normal S1, S2, no murmurs, rubs, clicks  or gallops Abdomen - soft, nontender, nondistended, no masses or organomegaly Extremities - intact peripheral pulses, mild edema of left ankle, negative Homan's sign Skin - multiple purpura noted on bilateral lower extremities.    Labs: Recent Results (from the past 24 hour(s))  CBC   Collection Time   12/22/10  6:50 PM      Component Value Range   WBC 5.2  4.0 - 10.5 (K/uL)   RBC 3.95  3.87 - 5.11 (MIL/uL)   Hemoglobin 10.0 (*) 12.0 - 15.0 (g/dL)   HCT 40.9 (*) 81.1 - 46.0 (%)   MCV 75.2 (*) 78.0 - 100.0 (fL)   MCH 25.3 (*) 26.0 - 34.0 (pg)   MCHC 33.7  30.0 - 36.0 (g/dL)   RDW 91.4 (*) 78.2 - 15.5 (%)   Platelets 207  150 - 400 (K/uL)  APTT   Collection Time   12/22/10  6:50 PM      Component Value Range   aPTT 35  24 - 37 (seconds)    PROTIME-INR   Collection Time   12/22/10  6:50 PM      Component Value Range   Prothrombin Time 13.9  11.6 - 15.2 (seconds)   INR 1.05  0.00 - 1.49   COMPREHENSIVE METABOLIC PANEL   Collection Time   12/22/10  6:50 PM      Component Value Range   Sodium 138  135 - 145 (mEq/L)   Potassium 4.0  3.5 - 5.1 (mEq/L)   Chloride 106  96 - 112 (mEq/L)   CO2 27  19 - 32 (mEq/L)   Glucose, Bld 91  70 - 99 (mg/dL)   BUN 14  6 - 23 (mg/dL)   Creatinine, Ser 9.56  0.50 - 1.10 (mg/dL)   Calcium 8.8  8.4 - 21.3 (mg/dL)   Total Protein 6.7  6.0 - 8.3 (g/dL)   Albumin 3.4 (*) 3.5 - 5.2 (g/dL)   AST 42 (*) 0 - 37 (U/L)   ALT 66 (*) 0 - 35 (U/L)   Alkaline Phosphatase 92  39 - 117 (U/L)   Total Bilirubin 0.3  0.3 - 1.2 (mg/dL)   GFR calc non Af Amer >60  >60 (mL/min)   GFR calc Af Amer >60  >60 (mL/min)   Imaging Studies:  No results found.   Assessment: Latoya Guzman is  35 y.o. [id number] at [redacted]w[redacted]d presents with lower extremity purpura, severe headache that has since resolved, and mild elevation of liver enzymes.    Plan: -As headache has resolved low risk for current bleed in brain -will have her follow up with her PCP in next week for possible referral to Rheumatology -instructed to go the [facility] ER if develops acute onset headache  BOOTH, ERIN8/28/20129:17 PM

## 2010-12-22 NOTE — Progress Notes (Signed)
Pt states, " I had a C/S on 7/21. My incision opened on August 1 and I went to [facility] ER but they said nothing was wrong. I now have some odor and brownish drainage Yesterday I started havingredness itching and burning on my lower legs after I walked my kids to school. Today it is worse and feels tight and it is more red and going up my legs."

## 2010-12-22 NOTE — Progress Notes (Signed)
Pt c/o redness and swelling in her right and left calves. Pt states it itches when clothes touches it and burns when the sun hits it.

## 2010-12-22 NOTE — ED Notes (Signed)
lAB RESULTS BACK, MD WILL SEE IN MAU.

## 2010-12-23 ENCOUNTER — Inpatient Hospital Stay (INDEPENDENT_AMBULATORY_CARE_PROVIDER_SITE_OTHER)
Admission: RE | Admit: 2010-12-23 | Discharge: 2010-12-23 | Disposition: A | Discharge status: Home / Self Care | Payer: Medicaid Other | Source: Ambulatory Visit | Attending: Emergency Medicine | Admitting: Emergency Medicine

## 2010-12-23 DIAGNOSIS — I776 Arteritis, unspecified: Secondary | ICD-10-CM

## 2010-12-23 LAB — COMPREHENSIVE METABOLIC PANEL
ALT: 67 U/L — ABNORMAL HIGH (ref 0–35)
AST: 40 U/L — ABNORMAL HIGH (ref 0–37)
Alkaline Phosphatase: 86 U/L (ref 39–117)
CO2: 29 mEq/L (ref 19–32)
Calcium: 9 mg/dL (ref 8.4–10.5)
Chloride: 107 mEq/L (ref 96–112)
GFR calc Af Amer: >60 mL/min (ref >60)
GFR calc non Af Amer: >60 mL/min (ref >60)
Glucose, Bld: 69 mg/dL — ABNORMAL LOW (ref 70–99)
Potassium: 3.8 mEq/L (ref 3.5–5.1)
Sodium: 141 mEq/L (ref 135–145)

## 2010-12-23 LAB — DIFFERENTIAL
Basophils Absolute: 0 10*3/uL (ref 0.0–0.1)
Basophils Relative: 0 % (ref 0–1)
Eosinophils Absolute: 0.1 10*3/uL (ref 0.0–0.7)
Monocytes Relative: 8 % (ref 3–12)
Neutro Abs: 2.8 10*3/uL (ref 1.7–7.7)
Neutrophils Relative %: 54 % (ref 43–77)

## 2010-12-23 LAB — CBC
Hemoglobin: 10.7 g/dL — ABNORMAL LOW (ref 12.0–15.0)
MCH: 25 pg — ABNORMAL LOW (ref 26.0–34.0)
Platelets: 232 10*3/uL (ref 150–400)
RBC: 4.28 MIL/uL (ref 3.87–5.11)
WBC: 5.1 10*3/uL (ref 4.0–10.5)

## 2010-12-25 ENCOUNTER — Ambulatory Visit (INDEPENDENT_AMBULATORY_CARE_PROVIDER_SITE_OTHER): Payer: Medicaid Other | Admitting: Family Medicine

## 2010-12-25 ENCOUNTER — Encounter: Payer: Self-pay | Admitting: Family Medicine

## 2010-12-25 VITALS — BP 127/85 | HR 66 | Wt 220.0 lb

## 2010-12-25 DIAGNOSIS — R7989 Other specified abnormal findings of blood chemistry: Secondary | ICD-10-CM

## 2010-12-25 DIAGNOSIS — R760 Raised antibody titer: Secondary | ICD-10-CM

## 2010-12-25 DIAGNOSIS — R894 Abnormal immunological findings in specimens from other organs, systems and tissues: Secondary | ICD-10-CM

## 2010-12-25 LAB — COMPREHENSIVE METABOLIC PANEL
AST: 26 U/L (ref 0–37)
Albumin: 4.2 g/dL (ref 3.5–5.2)
Alkaline Phosphatase: 84 U/L (ref 39–117)
Calcium: 9.2 mg/dL (ref 8.4–10.5)
Chloride: 107 mEq/L (ref 96–112)
Glucose, Bld: 74 mg/dL (ref 70–99)
Potassium: 4.3 mEq/L (ref 3.5–5.3)
Sodium: 139 mEq/L (ref 135–145)
Total Protein: 6.9 g/dL (ref 6.0–8.3)

## 2010-12-25 NOTE — Assessment & Plan Note (Addendum)
Advised patient to continue with lovenox and coumadin goal 2-3 as prescribed by gynecology.  Patient has never had blood clot, will ask rheumatology opinion on need for long term anticoagulation.  Rash may have been due to antiphospholipid antibody- photosensetive.   Has history of negative ANA.  Will refer to rheumatology to discuss with patient need for further evaluation for lupus given her family history

## 2010-12-25 NOTE — Patient Instructions (Signed)
Start Coumadin, follow-up Monday with lab visit Keep taking lovenox until told your coumadin is at the right level Will refer you to rheumatology

## 2010-12-25 NOTE — Assessment & Plan Note (Signed)
Lab Results  Component Value Date   ALT 67* 12/23/2010   AST 40* 12/23/2010   ALKPHOS 86 12/23/2010   BILITOT 0.4 12/23/2010   Mild elevation with no history of alcohol or other liver toxic medication.  Had normal Hep B in hospital.  Will order acute hepatitis panel and TSH.  Will monitor in 1-2 months for resolution.  Normal coagulation studies are reassuring.

## 2010-12-25 NOTE — Progress Notes (Signed)
  Subjective:    Patient ID: Latoya Guzman, female    DOB: 02-17-1976, 35 y.o.   MRN: [id number]  HPI 35 yo with history of positive antiphospholipid antibody who is 4 weeks postaprtum.   Referred from gyn for evaluation of elevated LFT's, rash.  Patient also has concerns about anticoagulation  For antiphospholipid antibody prescribed by her gynecologist.    Was seen on August 28th, per patient she has had 1 week of rash that is now fading, was told it was purpura at [facility] office.  Work-up included mildly elevated LFT's, normal PTT/INR and CBC.  Went to Urgent care on August 29th, and had a normal ANA.  ANA  Date Value Range Status  12/22/2010 NEGATIVE  NEGATIVE (no units) Final     INR  Date Value Range Status  12/22/2010 1.05  0.00-1.49 (no units) Final    Review of Systems reports easy bruising and bleeding gums at times.  No problems with bleeding postpartum or during surgery.     Objective:   Physical Exam GEN: Alert & Oriented, No acute distress CV:  Regular Rate & Rhythm, no murmur Respiratory:  Normal work of breathing, CTAB Abd:  + BS, soft, mild tenderness in RUQ.  No HSM.  Difficult to assess due to body habitus. Ext: no pre-tibial edema.  No palpable purpura.  Possible resolving purpura on bilateral shins.        Assessment & Plan:

## 2010-12-29 ENCOUNTER — Telehealth: Payer: Self-pay | Admitting: *Deleted

## 2010-12-29 ENCOUNTER — Encounter: Payer: Self-pay | Admitting: Family Medicine

## 2010-12-29 LAB — HEPATITIS PANEL, ACUTE: Hep B C IgM: NEGATIVE

## 2010-12-29 NOTE — Telephone Encounter (Signed)
LVM for patient to call back to find out some information so that I can give to Dr. Ines Bloomer office for Rheumatology visit. Her appointment is 9/12 patient to arrive @ 1:20pm. [address] [place] [address]. Phone number is [phone] call at least 24 hours in advance if needing to reschedule or cancel. They are mailing out information that needs to be filled out before visit and directions there.  *I am needing to find out if the address on file is correct it has [address] Wanting to know if the [address] is correct and if it is [address], [address], street. *Also we do not have a insurance card on file, need a copy and need to find out if it is [place] access. *Being patient has just had baby they need to know if she is nursing or not.

## 2010-12-30 NOTE — Telephone Encounter (Signed)
LVM for patient to call back. ?

## 2010-12-31 NOTE — H&P (Signed)
Agree with above note.  Latoya Guzman H. 12/31/2010 11:19 AM

## 2010-12-31 NOTE — Telephone Encounter (Signed)
Called and spoke with pt's mother Erie Noe) and gave her the information regarding Bailyn's appt with dr.truslow and told her to inform her daughter that if she is unable to keep the appt she MUST call [phone] and cancel/reschedule 24 hours in advance or she may be charged a fee.  Asked her to remind her to be sure to bring her insurance card and ID. Also I verified her address [address]land KitchenLoralee Pacas [place]

## 2010-12-31 NOTE — Telephone Encounter (Signed)
LVM for patient to call back. ?

## 2011-01-14 LAB — URINALYSIS, ROUTINE W REFLEX MICROSCOPIC
Bilirubin Urine: NEGATIVE
Bilirubin Urine: NEGATIVE
Hgb urine dipstick: NEGATIVE
Nitrite: NEGATIVE
Nitrite: NEGATIVE
Protein, ur: NEGATIVE
Specific Gravity, Urine: >1.03 — ABNORMAL HIGH
Urobilinogen, UA: 0.2
pH: 6

## 2011-01-14 LAB — COMPREHENSIVE METABOLIC PANEL
AST: 27
Albumin: 3.1 — ABNORMAL LOW
Calcium: 9.1
Creatinine, Ser: 0.64
GFR calc Af Amer: >60
GFR calc non Af Amer: >60
Total Protein: 6.5

## 2011-01-14 LAB — FACTOR 5 LEIDEN

## 2011-01-14 LAB — CBC
MCHC: 35.8
MCV: 77.7 — ABNORMAL LOW
Platelets: 232
RDW: 25.8 — ABNORMAL HIGH

## 2011-01-14 LAB — POCT URINALYSIS DIP (DEVICE)
Bilirubin Urine: NEGATIVE
Hgb urine dipstick: NEGATIVE
Specific Gravity, Urine: 1.02
pH: 6.5

## 2011-01-14 LAB — PROTEIN C ACTIVITY: Protein C Activity: 106 % (ref 75–133)

## 2011-01-14 LAB — PROTEIN C, TOTAL: Protein C, Total: 86 % (ref 70–140)

## 2011-01-15 LAB — POCT URINALYSIS DIP (DEVICE)
Hgb urine dipstick: NEGATIVE
Hgb urine dipstick: NEGATIVE
Protein, ur: 30 — AB
Protein, ur: NEGATIVE
Specific Gravity, Urine: 1.02
Specific Gravity, Urine: 1.02
Urobilinogen, UA: 0.2
Urobilinogen, UA: 0.2
pH: 6.5

## 2011-01-15 LAB — URINALYSIS, ROUTINE W REFLEX MICROSCOPIC
Bilirubin Urine: NEGATIVE
Hgb urine dipstick: NEGATIVE
Specific Gravity, Urine: 1.025
Urobilinogen, UA: 0.2
pH: 6.5

## 2011-01-15 LAB — CBC
Hemoglobin: 11.6 — ABNORMAL LOW
MCHC: 34.9
MCV: 81.8
RBC: 4.05

## 2011-01-18 LAB — URINALYSIS, ROUTINE W REFLEX MICROSCOPIC
Nitrite: NEGATIVE
Specific Gravity, Urine: >1.03 — ABNORMAL HIGH
Urobilinogen, UA: 0.2
pH: 6

## 2011-01-18 LAB — POCT URINALYSIS DIP (DEVICE)
Glucose, UA: NEGATIVE
Glucose, UA: NEGATIVE
Hgb urine dipstick: NEGATIVE
Ketones, ur: NEGATIVE
Nitrite: NEGATIVE
Nitrite: NEGATIVE
Operator id: 297281
Operator id: 15968
Operator id: 297281
Protein, ur: NEGATIVE
Specific Gravity, Urine: 1.02
Urobilinogen, UA: 0.2
Urobilinogen, UA: 0.2
Urobilinogen, UA: 0.2
pH: 7

## 2011-01-18 LAB — WET PREP, GENITAL: Clue Cells Wet Prep HPF POC: NONE SEEN

## 2011-01-19 LAB — CBC
HCT: 35.7 — ABNORMAL LOW
HCT: 36.5
Hemoglobin: 10.7 — ABNORMAL LOW
Hemoglobin: 12.7
Hemoglobin: 12.9
MCHC: 35.3
MCV: 88.4
MCV: 88.6
Platelets: 206
RBC: 4.12
RDW: 13.3
RDW: 13.4
RDW: 13.4
WBC: 8.4
WBC: 9.8

## 2011-01-19 LAB — CROSSMATCH
ABO/RH(D): B POS
Antibody Screen: NEGATIVE

## 2011-01-19 LAB — URINALYSIS, ROUTINE W REFLEX MICROSCOPIC
Ketones, ur: NEGATIVE
Leukocytes, UA: NEGATIVE
Nitrite: NEGATIVE
Nitrite: NEGATIVE
Protein, ur: NEGATIVE
Protein, ur: NEGATIVE
Specific Gravity, Urine: 1.025
Urobilinogen, UA: 0.2
pH: 7

## 2011-01-19 LAB — WET PREP, GENITAL: Yeast Wet Prep HPF POC: NONE SEEN

## 2011-01-19 LAB — POCT URINALYSIS DIP (DEVICE)
Bilirubin Urine: NEGATIVE
Bilirubin Urine: NEGATIVE
Glucose, UA: NEGATIVE
Glucose, UA: NEGATIVE
Glucose, UA: NEGATIVE
Hgb urine dipstick: NEGATIVE
Hgb urine dipstick: NEGATIVE
Hgb urine dipstick: NEGATIVE
Ketones, ur: NEGATIVE
Nitrite: NEGATIVE
Nitrite: NEGATIVE
Operator id: 148111
Specific Gravity, Urine: 1.015
Specific Gravity, Urine: 1.025
Urobilinogen, UA: 0.2
pH: 7
pH: 7

## 2011-01-19 LAB — PROTIME-INR
INR: 1
INR: 1
INR: 1.2

## 2011-01-19 LAB — URINE MICROSCOPIC-ADD ON

## 2011-01-19 LAB — FETAL FIBRONECTIN: Fetal Fibronectin: NEGATIVE

## 2011-01-21 LAB — BASIC METABOLIC PANEL
Chloride: 106
GFR calc Af Amer: >60
Potassium: 3.7

## 2011-01-21 LAB — DIFFERENTIAL
Eosinophils Absolute: 0.1
Eosinophils Relative: 2
Lymphs Abs: 1.7
Monocytes Absolute: 0.2
Monocytes Relative: 7

## 2011-01-21 LAB — CBC
HCT: 34.5 — ABNORMAL LOW
Hemoglobin: 11.9 — ABNORMAL LOW
MCV: 82.8
RBC: 4.16
WBC: 3.8 — ABNORMAL LOW

## 2011-01-21 LAB — SAMPLE TO BLOOD BANK

## 2011-01-21 LAB — URINALYSIS, ROUTINE W REFLEX MICROSCOPIC
Ketones, ur: 40 — AB
Nitrite: POSITIVE — AB
Specific Gravity, Urine: 1.018
pH: 5

## 2011-01-21 LAB — PROTIME-INR: INR: 1

## 2011-01-22 ENCOUNTER — Encounter: Payer: Self-pay | Admitting: Obstetrics & Gynecology

## 2011-01-22 ENCOUNTER — Ambulatory Visit: Payer: Medicaid Other | Admitting: Obstetrics & Gynecology

## 2011-01-22 ENCOUNTER — Ambulatory Visit (INDEPENDENT_AMBULATORY_CARE_PROVIDER_SITE_OTHER): Payer: Medicaid Other | Admitting: Obstetrics & Gynecology

## 2011-01-22 DIAGNOSIS — R748 Abnormal levels of other serum enzymes: Secondary | ICD-10-CM

## 2011-01-22 DIAGNOSIS — O99345 Other mental disorders complicating the puerperium: Secondary | ICD-10-CM

## 2011-01-22 LAB — CBC
MCV: 72.7 fL — ABNORMAL LOW (ref 78.0–100.0)
Platelets: 277 10*3/uL (ref 150–400)
RDW: 16.8 % — ABNORMAL HIGH (ref 11.5–15.5)
WBC: 4.7 10*3/uL (ref 4.0–10.5)

## 2011-01-22 LAB — COMPREHENSIVE METABOLIC PANEL
AST: 24 U/L (ref 0–37)
Albumin: 4.3 g/dL (ref 3.5–5.2)
Alkaline Phosphatase: 84 U/L (ref 39–117)
BUN: 11 mg/dL (ref 6–23)
Creat: 0.98 mg/dL (ref 0.50–1.10)
Glucose, Bld: 77 mg/dL (ref 70–99)
Potassium: 4.2 mEq/L (ref 3.5–5.3)
Total Bilirubin: 0.4 mg/dL (ref 0.3–1.2)

## 2011-01-22 MED ORDER — SERTRALINE HCL 100 MG PO TABS: 100.0000 mg | ORAL_TABLET | Freq: Every day | ORAL | Status: DC

## 2011-01-22 NOTE — Patient Instructions (Signed)
Postpartum Depression Depression During and After Pregnancy After delivery, your body is going through a drastic change in hormone levels. You may find yourself crying for no apparent reason and unable to cope with all the changes a new baby brings. This is a common response following a pregnancy. Seek support from your partner and/or friends and just give yourself time to recover. If these feelings persist and you feel you are getting worse, contact your caregiver or other professionals who can help you. WHAT IS DEPRESSION? Depression can be described as feeling sad, blue, unhappy, miserable, or down in the dumps. Most of us feel this way at one time or another for short periods. But true clinical depression is a mood disorder in which feelings of sadness, loss, anger, fear, or frustration interfere with everyday life for an extended time. Depression can be mild, moderate, or severe. The degree of depression, which your caregiver can determine, influences your treatment. Postpartum depression occurs within a couple days to months after delivering your baby. HOW COMMON IS DEPRESSION DURING AND AFTER PREGNANCY? Depression that occurs during pregnancy or within a year after delivery is called perinatal depression. Depression after pregnancy is also called postpartum depression or peripartum depression. The exact number of women with depression during this time is unknown, but it occurs in between 10-15% of women. Researchers believe that depression is one of the most common complications during and after pregnancy. The depression is often not recognized or treated, because some normal pregnancy changes cause similar symptoms and are happening at the same time. Tiredness, problems sleeping, stronger emotional reactions, and changes in body weight may occur during and after pregnancy. But these symptoms may also be signs of depression.  WHAT CAUSES DEPRESSION? There may be a number of reasons why a woman gets  depressed.  Rapid hormone changes. Estrogen and progesterone usually decrease immediately after delivering your baby. Researchers think the fast change in hormone levels may lead to depression, just as smaller changes in hormones can affect a woman's moods before she gets her menstrual period.   Decrease in thyroid hormone. Thyroid hormone regulates how your body uses and stores energy from food (metabolism). A simple blood test can tell if this condition is causing a woman's depression. If so, thyroid medicine can be prescribed by your caregiver.   A stressful life event, such as a death in the family. This can cause chemical changes in the brain that lead to depression.   Feeling overwhelmed by caring for and raising a new baby.   Depression is also an illness that runs in some families. It is not always clear what causes depression.  FACTORS THAT MAY INCREASE A WOMAN'S CHANCE OF DEPRESSION DURING PREGNANCY:  History of depression.  Substance abuse, alcohol, or drugs.   Little support from family and friends.   Problems with previous pregnancy or birth.   Young age for motherhood.   Living alone.   Little or no social support.   Family history of mental illness.   Anxiety about the fetus.   Marital or financial problems.   Postpartum depression in a previous pregnancy.   Having a psychiatric illness (schizophrenia, bipolar disorder).   Going through a difficult or stressful pregnancy.   Going through a difficult labor and delivery.   Moving to another city or state during your pregnancy, or just after delivering your baby.   OTHER FACTORS THAT MAY CONTRIBUTE TO POSTPARTUM DEPRESSION INCLUDE:   Feeling tired after delivery, broken sleep patterns, and not   getting enough rest. This often keeps a new mother from regaining her full strength for weeks.   Feeling overwhelmed with a new baby to take care of and doubting your ability to be a good mother.   Feeling stress from  changes in work and home routines. Women sometimes think they need to be "super mom" or perfect. This is not realistic and can add stress.   Having feelings of loss. This can include loss of the identity of who you are, or were, before having the baby, loss of control, loss of your pre-pregnancy figure, and feeling less attractive.   Having less free time and less control over your time. Needing to stay home, indoors, for longer periods of time and having less time to spend with your partner and loved ones can contribute to depression.   Having trouble doing your daily activities at home or at work.   Fears about not knowing how to take of the baby correctly and about harming the baby.   Feelings of guilt that you are not taking care of the baby properly.  SYMPTOMS Any of these symptoms, during and after pregnancy, that last longer than 2 weeks are signs of depression:  Feeling restless or irritable.   Feeling sad, hopeless, and overwhelmed.   Crying a lot.   Having no energy or motivation.   Eating too little or too much.   Sleeping too little or too much.   Trouble focusing, remembering, or making decisions.   Feeling worthless and guilty.   Loss of interest or pleasure in activities.   Withdrawal from friends and family.   Having headaches, chest pains, rapid or irregular heartbeat (palpitations), or fast and shallow breathing (hyperventilation).   After pregnancy, being afraid of hurting the baby or oneself, and not having any interest in the baby.   Not being able to care for yourself or the baby.   Loss of interest in caring for the baby.   Anxiety and panic attacks.   Thoughts of harming yourself, the baby, or someone else.   Feelings of guilt because you feel you are not taking care of the baby well enough.  WHAT IS THE DIFFERENCE BETWEEN "BABY BLUES," POSTPARTUM DEPRESSION, AND POSTPARTUM PSYCHOSIS?  The "baby blues" occurs 70 to 80% of the time, and it can  happen in the days right after childbirth. It normally goes away within a few days to a week. A new mother can have sudden mood swings, sadness, crying spells, loss of appetite, sleeping problems, and feel irritable, restless, anxious, and lonely. Symptoms are not severe and treatment usually is not needed. But there are things you can do to feel better. Nap when the baby does. Ask for help from your spouse, family members, and friends. Join a support group of new moms or talk with other moms. If the "baby blues" does not go away in a week to 10 days or gets worse, you may have postpartum depression.   Postpartum depression can happen anytime within the first year after childbirth. A woman may have a number of symptoms, such as sadness, lack of energy, trouble concentrating, anxiety, and feelings of guilt and worthlessness. The difference between postpartum depression and the "baby blues" is that the feelings in postpartum depression are much stronger and often affects a woman's well-being. It keeps her from functioning well for a longer period of time. Postpartum depression needs to be treated by a caregiver. Counseling, support groups, and medicines can help.     Postpartum psychosis is rare. It occurs in 1 or 2 out of every 1000 births. It usually begins in the first 6 weeks after delivery. Women who have bipolar disorder, schizoaffective disorder, or family history of psychotic disease have a higher risk for developing postpartum psychosis. Symptoms may include delusions, hallucinations, sleep disturbances, and obsessive thoughts about the baby. A woman may have rapid mood swings, from depression, to irritability, to euphoria. This is a serious condition and needs professional care and treatment.  WHAT STEPS CAN I TAKE IF I HAVE SYMPTOMS OF DEPRESSION DURING PREGNANCY OR AFTER CHILDBIRTH?  Some women do not tell anyone about their symptoms, because they feel embarrassed, ashamed, or guilty about feeling  depressed when they are supposed to be happy. They worry that they will be viewed as unfit parents. Perinatal depression can happen to any woman. It does not mean you are a bad or a "not together" mom. You and your baby do not need to suffer. There is help. You should discuss these feelings with your spouse or partner, family, and caregiver.   There are different types of individual and group "talk therapies" that can help a woman with perinatal depression feel better and do better as a mom and as a person. Limited research suggests that many women with perinatal depression improve when treated with antidepressant medicine. Your caregiver can help you learn more about these options and decide which approach is best for you and your baby.   Speak to your caregiver if you are having symptoms of depression while you are pregnant or after you deliver your baby. Your caregiver can give you a questionnaire to test for depression. You can also be referred to a mental health professional who specializes in treating depression.  HOME CARE INSTRUCTIONS  Try to get as much rest as you can. Try to nap when the baby naps.   Stop putting pressure on yourself to do everything. Do as much as you can and leave the rest.   Ask for help with household chores and nighttime feedings. Ask your husband or partner to bring the baby to you, so you can breast-feed. If you can, have a friend, family member, or professional support person help you in the home for part of the day.   Talk to your husband, partner, family, and friends about how you are feeling.   Do not spend a lot of time alone. Get dressed and leave the house. Run an errand or take a short walk.   Spend time alone with your husband or partner.   Talk with other mothers, so you can learn from their experiences.   Join a support group for women with depression. Call a local hotline or look in your telephone book for information and services.   Do not make  any major life changes during pregnancy. Major changes can cause unneeded stress. However, sometimes big changes cannot be avoided. Arrange support and help in your new situation ahead of time.   Exercise regularly.   Eat a balanced and nourishing diet.   Seek help if there are marital or financial problems.   Take the medicine your caregiver gives, as directed.   Keep all your postpartum appointments.  HOW IS DEPRESSION TREATED? There are 2 common types of treatment for depression.  Talk therapy. This involves talking to a therapist, psychologist, clergyperson, or social worker, in order to learn to change how depression makes you think, feel, and act.   Medicine. Your caregiver can give   you an antidepressant medicine to help you. These medicines can help relieve the symptoms of depression.   Women who are pregnant or breast-feeding should talk with their caregivers about the advantages and risks of taking antidepressant medicines. Some women are concerned that taking these medicines may harm the baby. A mother's depression can affect her baby's development. Getting treatment is important for both mother and baby. The risks of taking medicine must be weighed against the risks of depression. It is a decision that women need to discuss carefully with their caregivers. Women who decide to take antidepressant medicines should talk to their caregivers about which antidepressant medicines are safer to take while pregnant or breast-feeding.  What effects can untreated depression have?  Depression not only hurts the mother, but it also affects her family. Some researchers have found that depression during pregnancy can raise the risk of delivering an underweight baby or a premature infant. Some women with depression have difficulty caring for themselves during pregnancy. They may have trouble eating and do not gain enough weight during the pregnancy. They may also have trouble sleeping, may miss  prenatal visits, may not follow medical instructions, have a poor diet, or may use harmful substances, like tobacco, alcohol, or illegal drugs.   Postpartum depression can affect a mother's ability to parent. She may lack energy, have trouble concentrating, be irritable, and not be able to meet her child's needs for love and affection. As a result, she may feel guilty and lose confidence in herself as a mother. This can make the depression worse. Researchers believe that postpartum depression can affect the infant by causing delays in language development, problems with emotional bonding to others, behavioral problems, lower activity levels, sleep problems, and distress. It helps if the father or another caregiver can assist in meeting the needs of the baby, and other children in the family, while the mother is depressed.   All children deserve the chance to have a healthy mom. All moms deserve the chance to enjoy their life and their children. Do not suffer alone. If you are experiencing symptoms of depression during pregnancy or after having a baby, tell a loved one and call your caregiver right away.  SEEK MEDICAL CARE IF:  You think you have postpartum depression.   You want medicine to treat your postpartum depression.   You want a referral to a psychiatrist or psychologist.   You are having a reaction or problems with your medicine.  SEEK IMMEDIATE MEDICAL CARE IF:  You have suicidal feelings.   You feel you may harm the baby.   You feel you may harm your spouse/partner, or someone else.   You feel you need to be admitted to a hospital now.   You feel you are losing control and need treatment immediately.  FOR MORE INFORMATION You can find out more about depression during and after pregnancy by contacting the National Women's Health Information Center (NWHIC) at 1-800-994-9662 or the following organizations:  National Institute of Mental Health, NIH, HHS  Phone: (301) 496-9576    Internet Address: http://www.nimh.nih.gov   American Psychological Association  Phone: (800) 374-2721  Internet Address: http://www.apa.org   Postpartum Education for Parents  Phone: (805) 564-3888  Internet Address: http://www.sbpep.org   National Mental Health Information Center, SAMHSA, HHS Phone: (800) 789-2647 Internet Address: http://www.mentalhealth.org   National Mental Health Association Phone: (800) 969-NMHA Internet Address: http://www.nmha.org   Postpartum Support International Phone: (800) 773-6667 Internet Address: http://www.postpartum.net   Document Released: 01/15/2004 Document Re-Released:   09/30/2009 [employer] Patient Information 2011 [employer]. 

## 2011-01-22 NOTE — Progress Notes (Signed)
  Subjective:     Latoya Guzman is a 35 y.o. female who presents for a postpartum visit. She is 2 months postpartum following a RLTCS and BTL on 11/16/10.  I have fully reviewed the prenatal and intrapartum course. 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 Followed by the [facility]. The delivery was at 39 gestational weeks. Outcome: repeat cesarean section, low transverse incision. Anesthesia: spinal. Postpartum course has been complicated by incisional cellulitis treated with antibiotics, elevated LFTs, extremity purpura for which she had negative rheumatologic and infectious evaluation by Dr. Earnest Bailey at the [facility].    Baby Tayvion's course has been complicated; on day 11, he  was transferred to [facility] for concern for a left lower lung mass and needed surgery.  Nicanor Bake is doing well now, and is feeding by both breast and bottle - unknown formula.    She reports persistent postpartum bleeding; has not had a day without bleeding since delivery. Bowel function is normal. Bladder function is normal. Patient is not sexually active. Contraception method is tubal ligation. Postpartum depression screening: her score is 4 but she reports that she is very depressed, especially given all the aforementioned complications.  She has a history of depression prior to pregnancy and was on Lexapro briefly.  She denies suicidal or homicidal ideation.  She desires treatment for postpartum depression.  The following portions of the patient's history were reviewed and updated as appropriate: allergies, current medications, past family history, past medical history, past social history, past surgical history and problem list.  Review of Systems A comprehensive review of systems was negative.   Objective:    BP 131/86  Pulse 68  Temp(Src) 98.4 F (36.9 C) (Oral)  Ht 5\' 11"  (1.803 m)  Wt 224 lb 12.8 oz (101.969 kg)   BMI 31.35 kg/m2  Breastfeeding? Yes  General:  alert and no distress   Breasts:  inspection negative, no nipple discharge or bleeding, no masses or nodularity palpable  Lungs: clear to auscultation bilaterally  Heart:  regular rate and rhythm  Abdomen: soft, non-tender; bowel sounds normal; no masses,  no organomegaly and Incision is C/D/I, no erythema, no induration or drainage. Healing well.   Vulva:  normal  Vagina: normal vagina and moderate blood noted in vagina  Cervix:  no cervical motion tenderness and no lesions  Corpus: normal size, contour, position, consistency, mobility, non-tender  Adnexa:  normal adnexa and no mass, fullness, tenderness  Rectal Exam: Normal rectovaginal exam        Assessment:    Patient is here for postpartum exam.  She is reporting persistent bleeding since delivery and postpartum depression.  All other issues including antiocoagulation are being followed by [facility].  Plan:    1. Contraception: tubal ligation 2. For her postpartum bleeding; will check labs today, pelvic ultrasound to evaluate for possible retained POCs or other anomalies.  Will follow up results and manage accordingly. 3. For her postpartum depression, patient was counseled regarding medication and/or counseling. Declines counseling now, agrees to try medication. Zoloft 100 mg daily e-prescribed, will monitor for side effects. HI/SI precautions advised; she was told to come back here or go to nearest ER for any worsening symptoms.  Will re-evaluate at next visit.  3. Follow up in: 2 weeks for discussion for results and further management or as needed.

## 2011-01-26 ENCOUNTER — Telehealth: Payer: Self-pay | Admitting: *Deleted

## 2011-01-26 ENCOUNTER — Ambulatory Visit (HOSPITAL_COMMUNITY): Payer: Medicaid Other | Attending: Obstetrics & Gynecology

## 2011-01-26 NOTE — Telephone Encounter (Signed)
Pt missed appointment for ultrasound.

## 2011-01-27 ENCOUNTER — Encounter (HOSPITAL_COMMUNITY): Payer: Self-pay | Admitting: Obstetrics & Gynecology

## 2011-01-29 LAB — URINALYSIS, ROUTINE W REFLEX MICROSCOPIC
Hgb urine dipstick: NEGATIVE
Nitrite: NEGATIVE
Specific Gravity, Urine: 1.02
Urobilinogen, UA: 0.2
pH: 6.5

## 2011-01-29 LAB — POCT URINALYSIS DIP (DEVICE)
Bilirubin Urine: NEGATIVE
Glucose, UA: NEGATIVE
Hgb urine dipstick: NEGATIVE
Ketones, ur: NEGATIVE
Nitrite: NEGATIVE

## 2011-01-29 LAB — GC/CHLAMYDIA PROBE AMP, GENITAL
Chlamydia, DNA Probe: NEGATIVE
GC Probe Amp, Genital: NEGATIVE

## 2011-01-29 LAB — WET PREP, GENITAL
Clue Cells Wet Prep HPF POC: NONE SEEN
Trich, Wet Prep: NONE SEEN
Yeast Wet Prep HPF POC: NONE SEEN

## 2011-02-01 LAB — URINALYSIS, ROUTINE W REFLEX MICROSCOPIC
Ketones, ur: NEGATIVE
Protein, ur: NEGATIVE
Urobilinogen, UA: 0.2

## 2011-02-02 LAB — DIFFERENTIAL
Basophils Relative: 1
Eosinophils Relative: 1
Lymphocytes Relative: 29
Monocytes Absolute: 0.3
Monocytes Relative: 6
Neutrophils Relative %: 63

## 2011-02-02 LAB — WET PREP, GENITAL: Trich, Wet Prep: NONE SEEN

## 2011-02-02 LAB — URINALYSIS, ROUTINE W REFLEX MICROSCOPIC
Bilirubin Urine: NEGATIVE
Glucose, UA: NEGATIVE
Ketones, ur: NEGATIVE
Nitrite: NEGATIVE
Protein, ur: NEGATIVE

## 2011-02-02 LAB — CBC
MCV: 68.8 — ABNORMAL LOW
Platelets: 246
RBC: 4.73
WBC: 5.4

## 2011-02-03 LAB — URINALYSIS, ROUTINE W REFLEX MICROSCOPIC
Glucose, UA: NEGATIVE
Leukocytes, UA: NEGATIVE
Protein, ur: NEGATIVE
Specific Gravity, Urine: 1.01
pH: 6

## 2011-02-03 LAB — URINE MICROSCOPIC-ADD ON: RBC / HPF: NONE SEEN

## 2011-02-03 LAB — WET PREP, GENITAL: Yeast Wet Prep HPF POC: NONE SEEN

## 2011-02-03 LAB — CBC
HCT: 26.4 — ABNORMAL LOW
MCV: 62.7 — ABNORMAL LOW
Platelets: 271
RBC: 4.21
WBC: 4.7

## 2011-02-03 LAB — POCT PREGNANCY, URINE
Operator id: 202651
Preg Test, Ur: POSITIVE

## 2011-02-26 ENCOUNTER — Ambulatory Visit: Payer: Medicaid Other | Admitting: Obstetrics and Gynecology

## 2011-03-26 ENCOUNTER — Encounter: Payer: Self-pay | Admitting: Family Medicine

## 2011-03-26 ENCOUNTER — Ambulatory Visit (INDEPENDENT_AMBULATORY_CARE_PROVIDER_SITE_OTHER): Payer: Medicaid Other | Admitting: Family Medicine

## 2011-03-26 DIAGNOSIS — F3289 Other specified depressive episodes: Secondary | ICD-10-CM

## 2011-03-26 DIAGNOSIS — F32A Depression, unspecified: Secondary | ICD-10-CM

## 2011-03-26 DIAGNOSIS — F329 Major depressive disorder, single episode, unspecified: Secondary | ICD-10-CM

## 2011-03-26 DIAGNOSIS — R7989 Other specified abnormal findings of blood chemistry: Secondary | ICD-10-CM

## 2011-03-26 DIAGNOSIS — R760 Raised antibody titer: Secondary | ICD-10-CM

## 2011-03-26 DIAGNOSIS — R894 Abnormal immunological findings in specimens from other organs, systems and tissues: Secondary | ICD-10-CM

## 2011-03-26 LAB — COMPREHENSIVE METABOLIC PANEL
ALT: 23 U/L (ref 0–35)
AST: 23 U/L (ref 0–37)
Albumin: 4.3 g/dL (ref 3.5–5.2)
BUN: 12 mg/dL (ref 6–23)
CO2: 24 mEq/L (ref 19–32)
Calcium: 9.4 mg/dL (ref 8.4–10.5)
Chloride: 107 mEq/L (ref 96–112)
Potassium: 4.3 mEq/L (ref 3.5–5.3)

## 2011-03-26 NOTE — Patient Instructions (Signed)
Pick up your sertraline  Keep appointment with your therapist  Reschedule appointment with rheumatology  Make follow-up appointment with me in 3 weeks

## 2011-03-26 NOTE — Assessment & Plan Note (Signed)
Encouraged her to reschedule with rheumatology for need for anticoagulation and further workup for antiphospholipid ab and abnormal LFT's, history of vasculitis per OB.

## 2011-03-26 NOTE — Assessment & Plan Note (Signed)
Encouraged patient to start sertraline rxed by OB and to keep appt for therapy.  Will follow-up in 1 month.

## 2011-03-26 NOTE — Assessment & Plan Note (Signed)
Will recheck LFTs today.  

## 2011-03-26 NOTE — Progress Notes (Signed)
  Subjective:    Patient ID: Latoya Guzman, female    DOB: 1976/01/19, 35 y.o.   MRN: [id number]  HPIHere for follow-up.  At last visit was seen as a new patient for possible vasculitis and elevated LFT's. Was referred to rheumatology.  States she did not g because she needs to get the name of PCP changed on her medicaid card before being seen.  States she is continuing to work on this issue.  Antiphosphlipid antibody:  Was placed on coumadin and lovenox by ob for postpartum care.  She has self discontinued this.    Postpartum depression:  Was placed on sertraline for postpartum depression by OB.  She never started this nor has she followed up.  She states she has continued to have symptoms and was told by her childs pediatrician that she was concerned fo rpostpartum depression.  Her youngest child is 4 months old/  She is scheduled to see a therapist next week.  Notes sadness and feeling "numb"  No racing thought, less need for sleep, anger, or difficulty caring for child.  No SI, HI.   Review of Systemssee HPI     Objective:   Physical Exam  GEN: Alert & Oriented, No acute distress CV:  Regular Rate & Rhythm, no murmur Respiratory:  Normal work of breathing, CTAB Abd:  + BS, soft, no tenderness to palpation Ext: no pre-tibial edema Skin: no purpura       Assessment & Plan:

## 2011-03-29 ENCOUNTER — Encounter: Payer: Self-pay | Admitting: Family Medicine

## 2011-04-14 ENCOUNTER — Encounter: Payer: Self-pay | Admitting: Obstetrics and Gynecology

## 2011-04-14 ENCOUNTER — Ambulatory Visit: Payer: Medicaid Other | Admitting: Obstetrics and Gynecology

## 2011-04-16 ENCOUNTER — Ambulatory Visit: Payer: Medicaid Other | Admitting: Family Medicine

## 2011-04-19 ENCOUNTER — Encounter (HOSPITAL_COMMUNITY): Payer: Self-pay | Admitting: Emergency Medicine

## 2011-04-19 ENCOUNTER — Emergency Department (INDEPENDENT_AMBULATORY_CARE_PROVIDER_SITE_OTHER)
Admission: EM | Admit: 2011-04-19 | Discharge: 2011-04-19 | Disposition: A | Discharge status: Home / Self Care | Payer: Medicaid Other | Source: Home / Self Care | Attending: Family Medicine | Admitting: Family Medicine

## 2011-04-19 DIAGNOSIS — R51 Headache: Secondary | ICD-10-CM

## 2011-04-19 MED ORDER — HYDROCODONE-ACETAMINOPHEN 5-325 MG PO TABS: ORAL_TABLET | ORAL | Status: AC

## 2011-04-19 NOTE — ED Provider Notes (Signed)
History     CSN: [id number]  Arrival date & time 04/19/11  1343   First MD Initiated Contact with Patient 04/19/11 1433      Chief Complaint  Patient presents with  . Headache    (Consider location/radiation/quality/duration/timing/severity/associated sxs/prior treatment) Patient is a 35 y.o. female presenting with headaches. The history is provided by the patient.  Headache The primary symptoms include headaches, dizziness and nausea. Primary symptoms do not include vomiting. The symptoms began 6 to 12 hours ago. The symptoms are unchanged.  The headache began today. Headache is a new problem. The headache is not associated with weakness.  Dizziness also occurs with nausea. Dizziness does not occur with vomiting or weakness.  Additional symptoms do not include weakness.    Past Medical History  Diagnosis Date  . Blood transfusion 1998    Post C/S 2units  . Depression     Was on Lexapro Stopped when pregnant  . Preterm labor     PTL last two pregnancies  . Fibroid 03/2010    Noted on [place] in Dec.  . 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 Tricuspid valve regurgitation 02/27/2010  . Lupus Lupus Antigen     associated with pregnancy   . Sickle cell trait     Past Surgical History  Procedure Date  . Dilation and curettage of uterus 2005, 2006    1st for twin loss, 2nd for retained placenta  . Cesarean section 1998, 2009  . Tubal ligation   . Cesarean section 11/14/2010    Procedure: CESAREAN SECTION;  Surgeon: Hollie Salk C. Marice Potter, MD;  Location: [facility];  Service: Gynecology;  Laterality: N/A;  Repeat cesarean section with delivery of baby boy at 1000. Apgars 9/9. Bilateral tubal ligation with filshie clips.     Family History  Problem Relation Age of Onset  . Hypertension Mother   . Lupus Sister   . Diabetes Maternal Uncle   . Cancer Maternal Grandmother     History  Substance Use Topics  . Smoking status:  Never Smoker   . Smokeless tobacco: Never Used  . Alcohol Use: No    OB History    Grav Para Term Preterm Abortions TAB SAB Ect Mult Living   10 8 6 2 2  2  1 7       Review of Systems  Constitutional: Negative.   Eyes: Negative.   Respiratory: Negative.   Cardiovascular: Negative.   Gastrointestinal: Positive for nausea. Negative for vomiting.  Genitourinary: Negative.   Musculoskeletal: Negative.   Skin: Negative.   Neurological: Positive for dizziness and headaches. Negative for weakness and numbness.    Allergies  Review of patient's allergies indicates no known allergies.  Home Medications   Current Outpatient Rx  Name Route Sig Dispense Refill  . ENOXAPARIN SODIUM 40 MG/0.4ML [place] SOLN Subcutaneous Inject 40 mg into the skin daily.     Marland Kitchen FERROUS SULFATE 325 (65 FE) MG PO TABS Oral Take 1 tablet (325 mg total) by mouth 2 (two) times daily with a meal. 30 tablet 11  . PRENATAL PLUS 27-1 MG PO TABS Oral Take 1 tablet by mouth daily.     . SERTRALINE HCL 100 MG PO TABS Oral Take 1 tablet (100 mg total) by mouth daily. 30 tablet 3  . WARFARIN SODIUM 5 MG PO TABS Oral Take 1 tablet (5 mg total) by mouth daily. 30 tablet 1    BP  118/71  Pulse 70  Temp(Src) 98.3 F (36.8 C) (Oral)  Resp 12  SpO2 100%  LMP 03/30/2011  Breastfeeding? No  Physical Exam  Nursing note and vitals reviewed. Constitutional: She is oriented to person, place, and time. She appears well-developed and well-nourished.  HENT:  Head: Normocephalic and atraumatic.  Right Ear: Tympanic membrane normal.  Left Ear: Tympanic membrane normal.  Eyes: Conjunctivae, EOM and lids are normal. Pupils are equal, round, and reactive to light.  Fundoscopic exam:      The right eye shows no AV nicking, no hemorrhage and no papilledema.       The left eye shows no AV nicking, no hemorrhage and no papilledema.  Neck: Normal range of motion.  Pulmonary/Chest: Effort normal.  Musculoskeletal: Normal range of  motion.  Neurological: She is alert and oriented to person, place, and time. She has normal strength. No cranial nerve deficit or sensory deficit. Coordination and gait normal.  Skin: Skin is warm and dry.  Psychiatric: Her behavior is normal.    ED Course  Procedures (including critical care time)  Labs Reviewed - No data to display No results found.   1. Headache       MDM          Richardo Priest, MD 05/02/11 1239

## 2011-04-19 NOTE — ED Notes (Signed)
Patient reports she is not taking coumadin or lovenox as prescribed.  [facility] dr referred patient to rheumatologist, medicaid card had wrong name, office would not take card.  Patient is unsure of status of card, has a scheduled appt within the next 2 weeks.  Marland Kitchen  lovenox last dose was Monday, one week ago today, has not had coumadin on a month or more.  Patient report coumadin/lovenox use secondary to a pregnancy induced lupus

## 2011-04-19 NOTE — ED Notes (Signed)
This morning patient reports room spinning when trying to get up for the first time this morning, then had nausea, then felt like she was having an anxiety attack per patient.  Headache and blurry vision onset together, felt pain around base of skull, pressure at temporal area.  Headache intermittent, room spinning calmed down after an hour or two.

## 2011-06-20 IMAGING — CR DG CHEST 2V
2 series · 2 of 2 positions shown · non-contrast
Comparison: None

CLINICAL DATA: Chest pain

CHEST - 2 VIEW

[w chest pa]
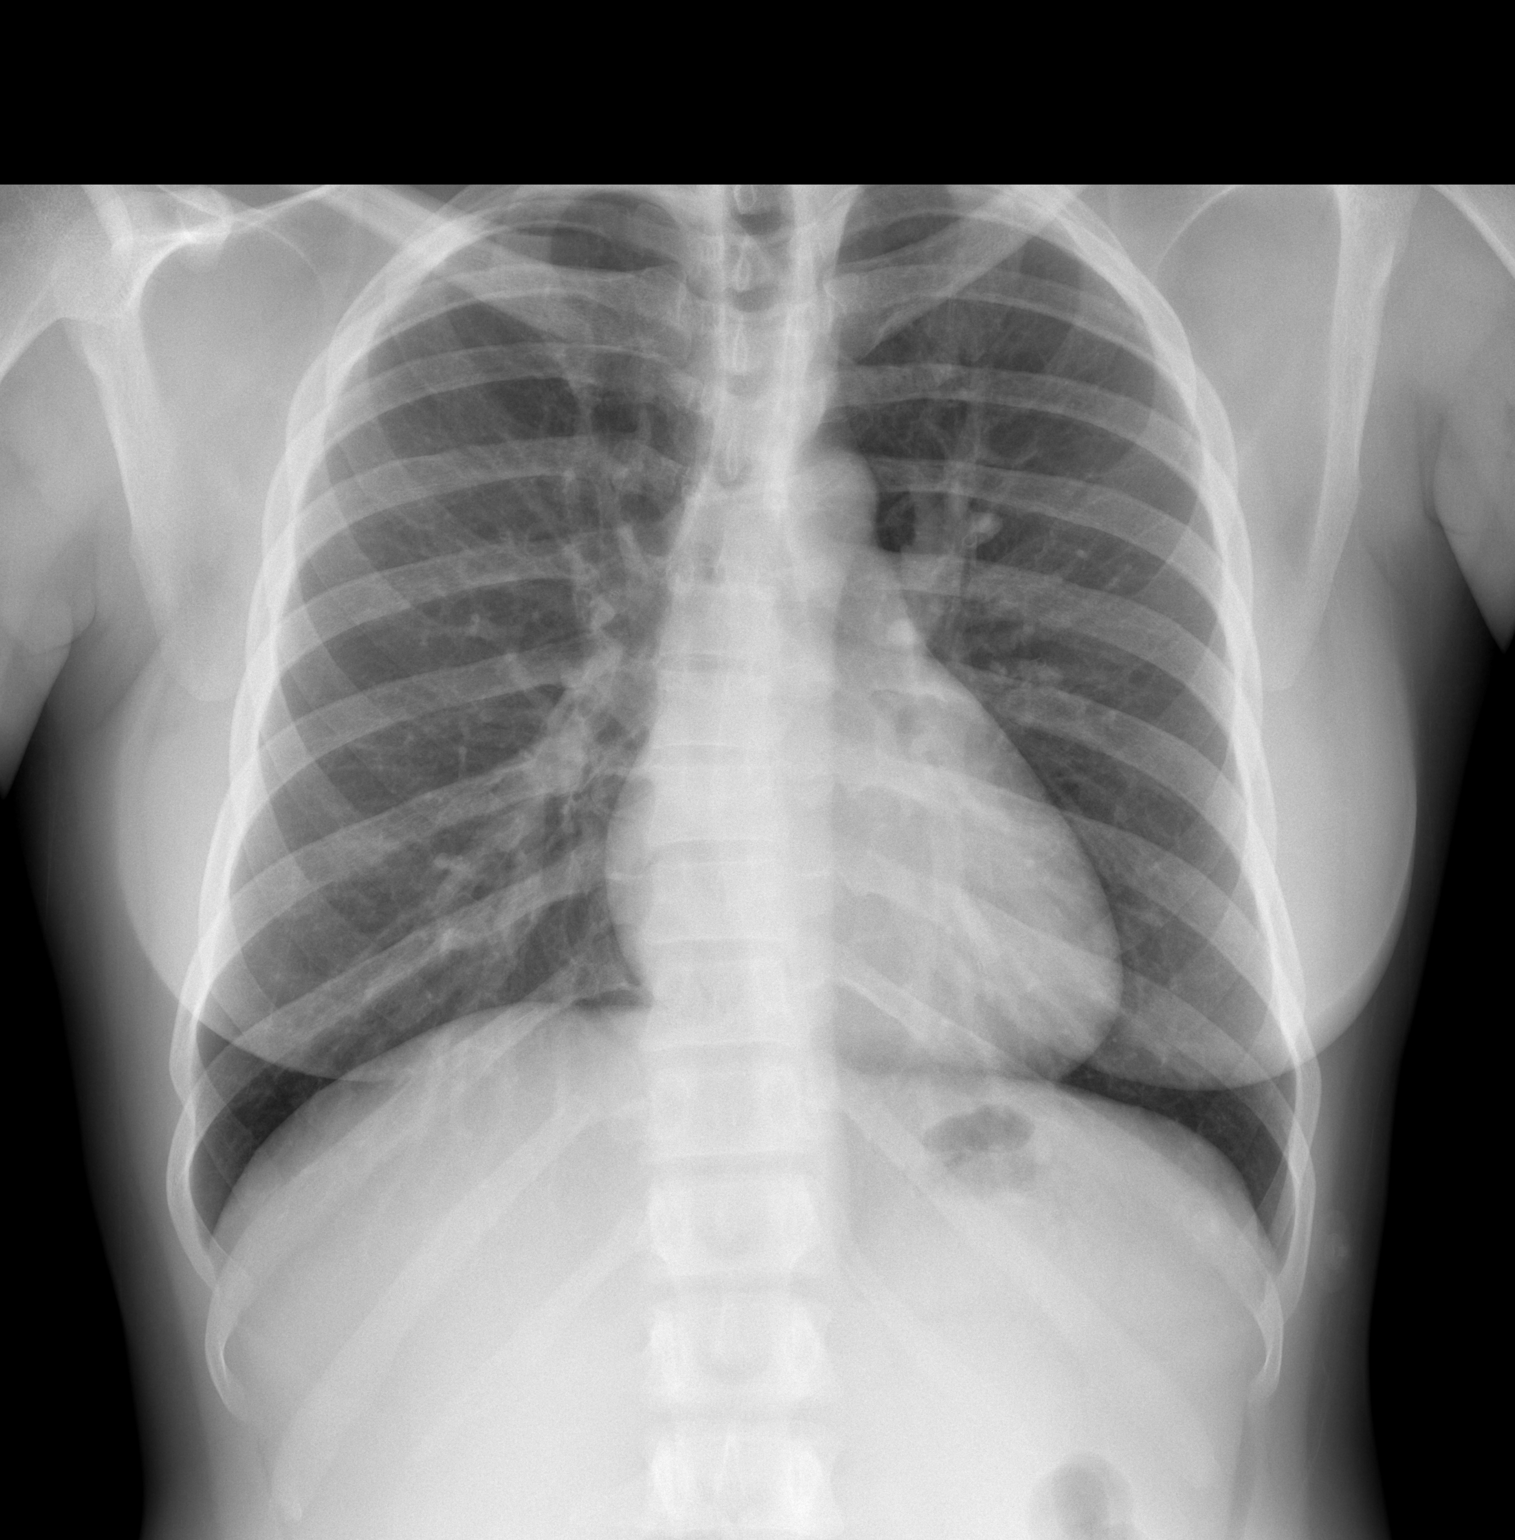

[w chest lat]
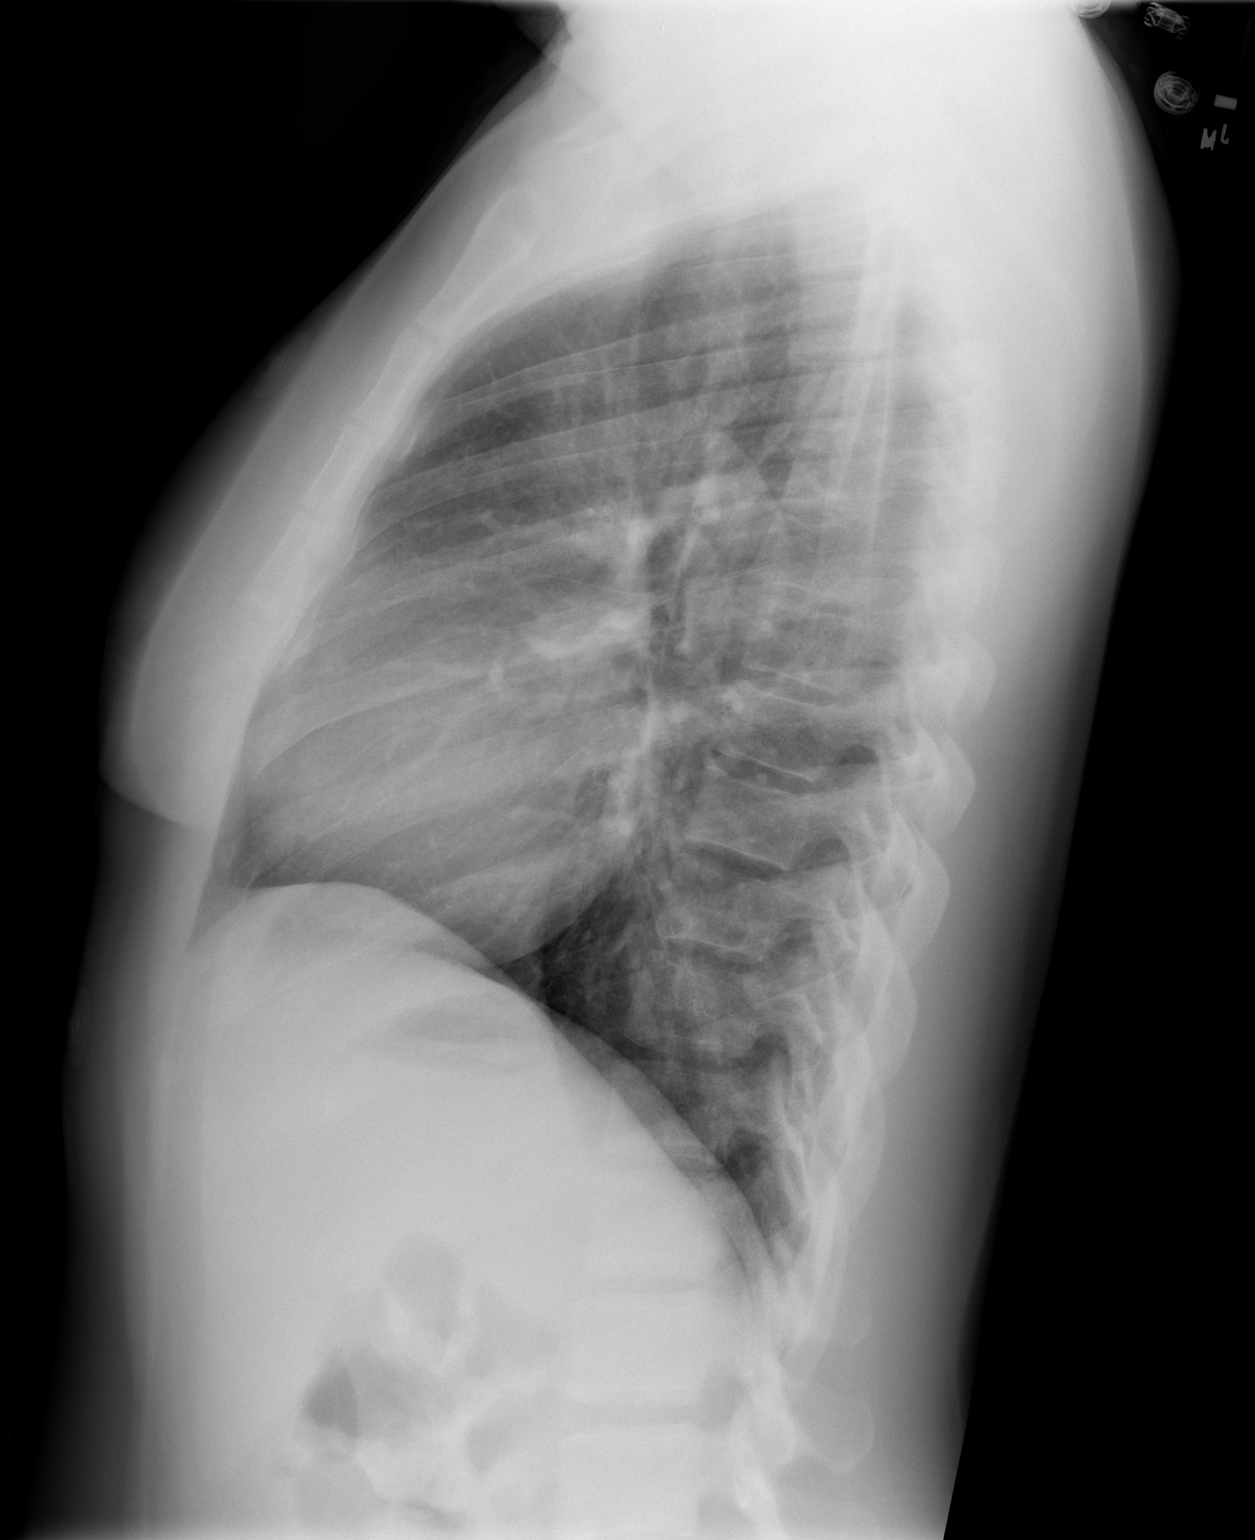

[2 of 2 positions shown; findings below may reference images not displayed]

FINDINGS: The heart size and mediastinal contours are within normal
limits.  Both lungs are clear.  The visualized skeletal structures
are unremarkable.
IMPRESSION: No active disease.

## 2012-04-02 ENCOUNTER — Inpatient Hospital Stay (HOSPITAL_COMMUNITY)
Admission: AD | Admit: 2012-04-02 | Discharge: 2012-04-02 | Disposition: A | Discharge status: Home / Self Care | Payer: Self-pay | Source: Ambulatory Visit | Attending: Obstetrics & Gynecology | Admitting: Obstetrics & Gynecology

## 2012-04-02 ENCOUNTER — Inpatient Hospital Stay (HOSPITAL_COMMUNITY): Payer: Self-pay

## 2012-04-02 ENCOUNTER — Encounter (HOSPITAL_COMMUNITY): Payer: Self-pay | Admitting: *Deleted

## 2012-04-02 DIAGNOSIS — N938 Other specified abnormal uterine and vaginal bleeding: Secondary | ICD-10-CM

## 2012-04-02 DIAGNOSIS — N949 Unspecified condition associated with female genital organs and menstrual cycle: Secondary | ICD-10-CM | POA: Insufficient documentation

## 2012-04-02 DIAGNOSIS — B9689 Other specified bacterial agents as the cause of diseases classified elsewhere: Secondary | ICD-10-CM | POA: Insufficient documentation

## 2012-04-02 DIAGNOSIS — N760 Acute vaginitis: Secondary | ICD-10-CM | POA: Insufficient documentation

## 2012-04-02 DIAGNOSIS — A499 Bacterial infection, unspecified: Secondary | ICD-10-CM

## 2012-04-02 DIAGNOSIS — R109 Unspecified abdominal pain: Secondary | ICD-10-CM | POA: Insufficient documentation

## 2012-04-02 LAB — URINALYSIS, ROUTINE W REFLEX MICROSCOPIC
Bilirubin Urine: NEGATIVE
Ketones, ur: NEGATIVE mg/dL
Leukocytes, UA: NEGATIVE
Nitrite: NEGATIVE
Specific Gravity, Urine: 1.025 (ref 1.005–1.030)
Urobilinogen, UA: 0.2 mg/dL (ref 0.0–1.0)

## 2012-04-02 LAB — WET PREP, GENITAL

## 2012-04-02 LAB — CBC WITH DIFFERENTIAL/PLATELET
Basophils Absolute: 0 10*3/uL (ref 0.0–0.1)
Basophils Relative: 0 % (ref 0–1)
Eosinophils Absolute: 0.1 10*3/uL (ref 0.0–0.7)
Eosinophils Relative: 1 % (ref 0–5)
HCT: 22.7 % — ABNORMAL LOW (ref 36.0–46.0)
Hemoglobin: 6.9 g/dL — CL (ref 12.0–15.0)
Lymphocytes Relative: 40 % (ref 12–46)
Lymphs Abs: 2.2 10*3/uL (ref 0.7–4.0)
Monocytes Absolute: 0.3 10*3/uL (ref 0.1–1.0)
Monocytes Relative: 6 % (ref 3–12)
Neutro Abs: 2.8 10*3/uL (ref 1.7–7.7)
Neutrophils Relative %: 53 % (ref 43–77)
Promyelocytes Absolute: 0 %
RBC: 4.03 MIL/uL (ref 3.87–5.11)

## 2012-04-02 MED ORDER — MEDROXYPROGESTERONE ACETATE 10 MG PO TABS: 10.0000 mg | ORAL_TABLET | Freq: Every day | ORAL | Status: DC

## 2012-04-02 MED ORDER — FERROUS SULFATE 325 (65 FE) MG PO TABS: 325.0000 mg | ORAL_TABLET | Freq: Three times a day (TID) | ORAL | Status: DC

## 2012-04-02 NOTE — MAU Note (Signed)
Patient states she has been having abdominal pain on the left side for about one week. Has had irregular periods for the past two months with bleeding twice in each month with last bleeding on 12-5 that was very light. Has face pain on the left side that radiates to neck and left ear. Had something "fall out" on 12-6 and started heavy bleeding yesterday but none now. Spotting only on tissue with urinating now.

## 2012-04-02 NOTE — MAU Note (Signed)
Having nausea for 3 days with occasional vomiting.

## 2012-04-02 NOTE — MAU Provider Note (Signed)
Attestation of Attending Supervision of Advanced Practitioner (CNM/NP): Evaluation and management procedures were performed by the Advanced Practitioner under my supervision and collaboration.  I have reviewed the Advanced Practitioner's note and chart, and I agree with the management and plan.  HARRAWAY-SMITH, Tyquan Carmickle 11:10 PM     

## 2012-04-02 NOTE — MAU Provider Note (Signed)
History     CSN: [id number]  Arrival date and time: 04/02/12 1724   First Provider Initiated Contact with Patient 04/02/12 1929      Chief Complaint  Patient presents with  . Vaginal Bleeding  . Possible Pregnancy  . Abdominal Pain  . Facial Pain   HPI Latoya Guzman is 36 y.o. [id number] Unknown weeks presenting with vaginal bleeding that began 5 days ago.  Was too early for expected menstrual cycle.  LMP 2 weeks ago.  Describes this bleeding as mid way her cycle.  2 days ago passed ? Blood clot showing me that it was the size of her palm.  Yesterday passed something that was clear and bleeding again.  Describes as "very heavy".  Bleeding less today but has lower abdominal and lower back pain.  Causing her a headache.  Sexually active with Tubal ligation for birth control.  Has taken Advil and Aleve for headache and toothache that is less after medication.      Past Medical History  Diagnosis Date  . Blood transfusion 1998    Post C/S 2units  . Depression     Was on Lexapro Stopped when pregnant  . Preterm labor     PTL last two pregnancies  . Fibroid 03/2010    Noted on [place] in Dec.  . 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 Tricuspid valve regurgitation 02/27/2010  . Lupus Lupus Antigen     associated with pregnancy   . Sickle cell trait     Past Surgical History  Procedure Date  . Dilation and curettage of uterus 2005, 2006    1st for twin loss, 2nd for retained placenta  . Cesarean section 1998, 2009  . Tubal ligation   . Cesarean section 11/14/2010    Procedure: CESAREAN SECTION;  Surgeon: Hollie Salk C. Marice Potter, MD;  Location: [facility];  Service: Gynecology;  Laterality: N/A;  Repeat cesarean section with delivery of baby boy at 1000. Apgars 9/9. Bilateral tubal ligation with filshie clips.     Family History  Problem Relation Age of Onset  . Hypertension Mother   . Lupus Sister   . Diabetes Maternal Uncle   .  Cancer Maternal Grandmother     History  Substance Use Topics  . Smoking status: Never Smoker   . Smokeless tobacco: Never Used  . Alcohol Use: No    Allergies: No Known Allergies  No prescriptions prior to admission    Review of Systems  Constitutional: Negative for fever and chills.  Gastrointestinal: Positive for abdominal pain (lower abdominal pain). Negative for nausea and vomiting.  Genitourinary:       + for vaginal bleeding   Physical Exam   Blood pressure 124/62, pulse 72, temperature 98.6 F (37 C), temperature source Oral, resp. rate 20, height 5\' 11"  (1.803 m), weight 217 lb 6.4 oz (98.612 kg), last menstrual period 03/30/2012, SpO2 100.00%.  Physical Exam  Constitutional: She is oriented to person, place, and time. She appears well-developed and well-nourished. No distress.  HENT:  Head: Normocephalic.  Neck: Normal range of motion.  Cardiovascular: Normal rate.   Respiratory: Effort normal.  GI: Soft. She exhibits no distension and no mass. There is no tenderness. There is no rebound and no guarding.  Genitourinary: There is no tenderness or lesion on the right labia. There is no tenderness or lesion on the left labia. Uterus is tender (mild). Uterus is not enlarged.  Cervix exhibits no motion tenderness and no discharge. Right adnexum displays tenderness. Right adnexum displays no mass and no fullness. Left adnexum displays tenderness. Left adnexum displays no mass and no fullness. There is bleeding (small amount of bleeding, no clot) around the vagina. Vaginal discharge (large amount of frothy pink discharge with odor) found.  Neurological: She is alert and oriented to person, place, and time.  Skin: Skin is warm and dry.  Psychiatric: She has a normal mood and affect. Her behavior is normal.   Results for orders placed during the hospital encounter of 04/02/12 (from the past 24 hour(s))  URINALYSIS, ROUTINE W REFLEX MICROSCOPIC     Status: Normal   Collection  Time   04/02/12  6:22 PM      Component Value Range   Color, Urine YELLOW  YELLOW   APPearance CLEAR  CLEAR   Specific Gravity, Urine 1.025  1.005 - 1.030   pH 6.0  5.0 - 8.0   Glucose, UA NEGATIVE  NEGATIVE mg/dL   Hgb urine dipstick NEGATIVE  NEGATIVE   Bilirubin Urine NEGATIVE  NEGATIVE   Ketones, ur NEGATIVE  NEGATIVE mg/dL   Protein, ur NEGATIVE  NEGATIVE mg/dL   Urobilinogen, UA 0.2  0.0 - 1.0 mg/dL   Nitrite NEGATIVE  NEGATIVE   Leukocytes, UA NEGATIVE  NEGATIVE  POCT PREGNANCY, URINE     Status: Normal   Collection Time   04/02/12  6:33 PM      Component Value Range   Preg Test, Ur NEGATIVE  NEGATIVE  WET PREP, GENITAL     Status: Abnormal   Collection Time   04/02/12  7:49 PM      Component Value Range   Yeast Wet Prep HPF POC NONE SEEN  NONE SEEN   Trich, Wet Prep NONE SEEN  NONE SEEN   Clue Cells Wet Prep HPF POC FEW (*) NONE SEEN   WBC, Wet Prep HPF POC FEW (*) NONE SEEN  CBC WITH DIFFERENTIAL     Status: Abnormal   Collection Time   04/02/12  8:00 PM      Component Value Range   WBC 5.4  4.0 - 10.5 K/uL   RBC 4.03  3.87 - 5.11 MIL/uL   Hemoglobin 6.9 (*) 12.0 - 15.0 g/dL   HCT 16.1 (*) 09.6 - 04.5 %   MCV 56.3 (*) 78.0 - 100.0 fL   MCH 17.1 (*) 26.0 - 34.0 pg   MCHC 30.4  30.0 - 36.0 g/dL   RDW 40.9 (*) 81.1 - 91.4 %   Platelets 318  150 - 400 K/uL   Neutrophils Relative 53  43 - 77 %   Lymphocytes Relative 40  12 - 46 %   Monocytes Relative 6  3 - 12 %   Eosinophils Relative 1  0 - 5 %   Basophils Relative 0  0 - 1 %   Band Neutrophils 0  0 - 10 %   Metamyelocytes Relative 0     Myelocytes 0     Promyelocytes Absolute 0     Blasts 0     nRBC 1 (*) 0 /100 WBC   Neutro Abs 2.8  1.7 - 7.7 K/uL   Lymphs Abs 2.2  0.7 - 4.0 K/uL   Monocytes Absolute 0.3  0.1 - 1.0 K/uL   Eosinophils Absolute 0.1  0.0 - 0.7 K/uL   Basophils Absolute 0.0  0.0 - 0.1 K/uL   RBC Morphology TARGET CELLS  MAU Course  Procedures  GC/CHL culture to lab  MDM 21:00 Care  turned over to N. Bascom Levels, CNM Assessment and Plan  A:  Abnormal vaginal bleeding      Pelvic pain      Bacterial vaginosis      Low hemaglobin  Eve Key, NP  US Transvaginal Non-ob  04/02/2012  *RADIOLOGY REPORT*  Clinical Data: Dysfunctional uterine bleeding.  Bilateral pelvic pain.  Negative urine pregnancy test.  TRANSABDOMINAL AND TRANSVAGINAL ULTRASOUND OF PELVIS Technique:  Both transabdominal and transvaginal ultrasound examinations of the pelvis were performed. Transabdominal technique was performed for global imaging of the pelvis including uterus, ovaries, adnexal regions, and pelvic cul-de-sac.  It was necessary to proceed with endovaginal exam following the transabdominal exam to visualize the uterus and adnexa.  Comparison:  None  Findings:  Uterus: 12 cm x 5.8 cm x 7.6 cm.  Heterogeneous echotexture is present suggesting adenomyosis.  Fibroid is present in the lower uterine segment measuring 20 mm x 19 mm x 22 mm.  Endometrium: 9 mm.  Small amount of endometrial fluid is present, adjacent to c section uterine scar.  Right ovary:  48 mm x 17 mm x 30 mm with normal physiologic appearance.  Left ovary: 38 mm x 19 mm x 19 mm with normal physiologic appearance.  Other findings: Trace free fluid, physiologic.  IMPRESSION:  1.  Normal endometrial thickness at 9 mm.  No focal endometrial thickening.  Tiny amount of endometrial fluid is present adjacent to the scar from C-section. 2.  Fibroid in the lower uterine segment measuring about 2 cm. 3.  Heterogeneous echotexture of the uterus suggesting adenomyosis.   Original Report Authenticated By: Andreas Newport, M.D.    US Pelvis Complete  04/02/2012  *RADIOLOGY REPORT*  Clinical Data: Dysfunctional uterine bleeding.  Bilateral pelvic pain.  Negative urine pregnancy test.  TRANSABDOMINAL AND TRANSVAGINAL ULTRASOUND OF PELVIS Technique:  Both transabdominal and transvaginal ultrasound examinations of the pelvis were performed. Transabdominal technique  was performed for global imaging of the pelvis including uterus, ovaries, adnexal regions, and pelvic cul-de-sac.  It was necessary to proceed with endovaginal exam following the transabdominal exam to visualize the uterus and adnexa.  Comparison:  None  Findings:  Uterus: 12 cm x 5.8 cm x 7.6 cm.  Heterogeneous echotexture is present suggesting adenomyosis.  Fibroid is present in the lower uterine segment measuring 20 mm x 19 mm x 22 mm.  Endometrium: 9 mm.  Small amount of endometrial fluid is present, adjacent to c section uterine scar.  Right ovary:  48 mm x 17 mm x 30 mm with normal physiologic appearance.  Left ovary: 38 mm x 19 mm x 19 mm with normal physiologic appearance.  Other findings: Trace free fluid, physiologic.  IMPRESSION:  1.  Normal endometrial thickness at 9 mm.  No focal endometrial thickening.  Tiny amount of endometrial fluid is present adjacent to the scar from C-section. 2.  Fibroid in the lower uterine segment measuring about 2 cm. 3.  Heterogeneous echotexture of the uterus suggesting adenomyosis.   Original Report Authenticated By: Andreas Newport, M.D.    A/P: 1. DUB - probably adenomyosis and fibroid noted on u/s - will give Provera 10 mg q day until f/u in clinic 2. Anemia - pt asymptomatic at this time, VS stable - will start iron, rev'd precautions, f/u with symptoms    Medication List     As of 04/02/2012 10:58 PM    START taking these medications  ferrous sulfate 325 (65 FE) MG tablet   Take 1 tablet (325 mg total) by mouth 3 (three) times daily with meals.      medroxyPROGESTERone 10 MG tablet   Commonly known as: PROVERA   Take 1 tablet (10 mg total) by mouth daily.          Where to get your medications    These are the prescriptions that you need to pick up. We sent them to a specific pharmacy, so you will need to go there to get them.   [facility] - [place], [place] - [address]    [address]  [place] [address]    Phone: [phone]        ferrous sulfate 325 (65 FE) MG tablet   medroxyPROGESTERone 10 MG tablet            Follow-up Information    Schedule an appointment as soon as possible for a visit with [facility].   Contact information:   [address] [place] [address] [phone]          .

## 2012-04-03 ENCOUNTER — Encounter (HOSPITAL_COMMUNITY): Payer: Self-pay | Admitting: *Deleted

## 2012-04-03 ENCOUNTER — Emergency Department (HOSPITAL_COMMUNITY)
Admission: EM | Admit: 2012-04-03 | Discharge: 2012-04-03 | Disposition: A | Discharge status: Home / Self Care | Payer: Self-pay | Attending: Emergency Medicine | Admitting: Emergency Medicine

## 2012-04-03 DIAGNOSIS — D573 Sickle-cell trait: Secondary | ICD-10-CM | POA: Insufficient documentation

## 2012-04-03 DIAGNOSIS — K047 Periapical abscess without sinus: Secondary | ICD-10-CM | POA: Insufficient documentation

## 2012-04-03 DIAGNOSIS — K056 Periodontal disease, unspecified: Secondary | ICD-10-CM | POA: Insufficient documentation

## 2012-04-03 DIAGNOSIS — Z8659 Personal history of other mental and behavioral disorders: Secondary | ICD-10-CM | POA: Insufficient documentation

## 2012-04-03 DIAGNOSIS — H9209 Otalgia, unspecified ear: Secondary | ICD-10-CM | POA: Insufficient documentation

## 2012-04-03 DIAGNOSIS — IMO0002 Reserved for concepts with insufficient information to code with codable children: Secondary | ICD-10-CM

## 2012-04-03 DIAGNOSIS — Z8679 Personal history of other diseases of the circulatory system: Secondary | ICD-10-CM | POA: Insufficient documentation

## 2012-04-03 MED ORDER — PENICILLIN V POTASSIUM 250 MG PO TABS: 250.0000 mg | ORAL_TABLET | Freq: Four times a day (QID) | ORAL | Status: AC

## 2012-04-03 MED ORDER — HYDROCODONE-ACETAMINOPHEN 5-325 MG PO TABS
1.0000 | ORAL_TABLET | Freq: Once | ORAL | Status: AC
  Administered 2012-04-03: 1 via ORAL
  Filled 2012-04-03: qty 1

## 2012-04-03 MED ORDER — ACETAMINOPHEN-CODEINE #3 300-30 MG PO TABS: 1.0000 | ORAL_TABLET | Freq: Four times a day (QID) | ORAL | Status: DC | PRN

## 2012-04-03 NOTE — ED Notes (Signed)
Pt reports left ear pain, difficulty hearing, hearing "popping sounds" from left ear, broken left lower wisdom tooth, 3 impacted wisdom teeth, left side and posterior neck pain. States she ear and neck pain developed about a week ago. States she was supposed to have impacted wisdom teeth removed a year ago, but she was pregnant at that time and was told that she had two leaky valves and was having active chest pain at the time. Pt denies chest pain at this time.

## 2012-04-03 NOTE — ED Notes (Signed)
Unable to find pt in waiting room. x1 

## 2012-04-03 NOTE — ED Notes (Signed)
[facility]

## 2012-04-03 NOTE — ED Provider Notes (Signed)
History   This chart was scribed for Derwood Kaplan, MD, by Frederik Pear, ER scribe. The patient was seen in room TR07C/TR07C and the patient's care was started at 2124.    CSN: [id number]  Arrival date & time 04/03/12  9562   First MD Initiated Contact with Patient 04/03/12 2124      Chief Complaint  Patient presents with  . her lt side of her body painful     (Consider location/radiation/quality/duration/timing/severity/associated sxs/prior treatment) HPI Comments: Latoya Guzman is a 36 y.o. female who presents to the Emergency Department complaining of a  gradually worsening, constant, moderate toothache with associated ear pain and left-sided neck pain and swelling that began 1 week ago. She states that the ear pain is associated with an intermittent popping noise and is aggravated by loud noise.She describes the neck pain, which is aggravated by movement,  as dull at times and throbbing at times. She reports that she has treated the pain at home with ibuprofen. She states that she intermittently hears a popping noise in her ear. She states that the ear pain is aggravated by loud noises. She denies any tinnitus, drainage, or loss of hearing. She denies any trauma to her pelvic region or dysuria. She reports that she was seen at [facility] last night due to vaginal bleeding and slight hematuria. She reports that she was discharged with prescriptions. She has a h/o of purpura and nephrolithiasis, but no h/o of dental infections.               Past Medical History  Diagnosis Date  . Blood transfusion 1998    Post C/S 2units  . Depression     Was on Lexapro Stopped when pregnant  . Preterm labor     PTL last two pregnancies  . Fibroid 03/2010    Noted on [place] in Dec.  . 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 Tricuspid valve regurgitation 02/27/2010  . Lupus Lupus Antigen     associated with pregnancy   . Sickle  cell trait     Past Surgical History  Procedure Date  . Dilation and curettage of uterus 2005, 2006    1st for twin loss, 2nd for retained placenta  . Cesarean section 1998, 2009  . Tubal ligation   . Cesarean section 11/14/2010    Procedure: CESAREAN SECTION;  Surgeon: Hollie Salk C. Marice Potter, MD;  Location: [facility];  Service: Gynecology;  Laterality: N/A;  Repeat cesarean section with delivery of baby boy at 1000. Apgars 9/9. Bilateral tubal ligation with filshie clips.     Family History  Problem Relation Age of Onset  . Hypertension Mother   . Lupus Sister   . Diabetes Maternal Uncle   . Cancer Maternal Grandmother     History  Substance Use Topics  . Smoking status: Never Smoker   . Smokeless tobacco: Never Used  . Alcohol Use: No    OB History    Grav Para Term Preterm Abortions TAB SAB Ect Mult Living   10 8 6 2 2  2  1 7       Review of Systems  HENT: Positive for ear pain, neck pain and dental problem. Negative for hearing loss, tinnitus and ear discharge.   All other systems reviewed and are negative.    Allergies  Review of patient's allergies indicates no known allergies.  Home Medications   Current Outpatient Rx  Name  Route  Sig  Dispense  Refill  . FERROUS SULFATE 325 (65 FE) MG PO TABS   Oral   Take 1 tablet (325 mg total) by mouth 3 (three) times daily with meals.   90 tablet   11   . MEDROXYPROGESTERONE ACETATE 10 MG PO TABS   Oral   Take 1 tablet (10 mg total) by mouth daily.   30 tablet   1     BP 123/68  Pulse 70  Temp 98.5 F (36.9 C) (Oral)  Resp 18  SpO2 100%  LMP 03/30/2012  Physical Exam  Nursing note and vitals reviewed. HENT:       Her TM is visible and no external canal erythema or exudates. Her TM is not ruptured. Her posterior pharynx is clear.There is no tonsillar enlargement or exudates. She has mastoid tenderness. On tooth # 18, she has some erosion of the tooth with tenderness to palpation. She has mild gingival swelling,  but no fluctuance that can be appreciated.   Neck:       She has paraspinal tenderness on the left side.   Cardiovascular: Normal rate, regular rhythm and normal heart sounds.   No murmur heard. Pulmonary/Chest: Effort normal and breath sounds normal.  Musculoskeletal:       She has no CVA tenderness.     ED Course  Procedures (including critical care time)  DIAGNOSTIC STUDIES: Oxygen Saturation is 100% on room air, normal by my interpretation.    COORDINATION OF CARE:  21:49- Discussed planned course of treatment with the patient, including following up with a dentist and ENT, who is agreeable at this time.    Labs Reviewed - No data to display US Transvaginal Non-ob  04/02/2012  *RADIOLOGY REPORT*  Clinical Data: Dysfunctional uterine bleeding.  Bilateral pelvic pain.  Negative urine pregnancy test.  TRANSABDOMINAL AND TRANSVAGINAL ULTRASOUND OF PELVIS Technique:  Both transabdominal and transvaginal ultrasound examinations of the pelvis were performed. Transabdominal technique was performed for global imaging of the pelvis including uterus, ovaries, adnexal regions, and pelvic cul-de-sac.  It was necessary to proceed with endovaginal exam following the transabdominal exam to visualize the uterus and adnexa.  Comparison:  None  Findings:  Uterus: 12 cm x 5.8 cm x 7.6 cm.  Heterogeneous echotexture is present suggesting adenomyosis.  Fibroid is present in the lower uterine segment measuring 20 mm x 19 mm x 22 mm.  Endometrium: 9 mm.  Small amount of endometrial fluid is present, adjacent to c section uterine scar.  Right ovary:  48 mm x 17 mm x 30 mm with normal physiologic appearance.  Left ovary: 38 mm x 19 mm x 19 mm with normal physiologic appearance.  Other findings: Trace free fluid, physiologic.  IMPRESSION:  1.  Normal endometrial thickness at 9 mm.  No focal endometrial thickening.  Tiny amount of endometrial fluid is present adjacent to the scar from C-section. 2.  Fibroid in the  lower uterine segment measuring about 2 cm. 3.  Heterogeneous echotexture of the uterus suggesting adenomyosis.   Original Report Authenticated By: Andreas Newport, M.D.    US Pelvis Complete  04/02/2012  *RADIOLOGY REPORT*  Clinical Data: Dysfunctional uterine bleeding.  Bilateral pelvic pain.  Negative urine pregnancy test.  TRANSABDOMINAL AND TRANSVAGINAL ULTRASOUND OF PELVIS Technique:  Both transabdominal and transvaginal ultrasound examinations of the pelvis were performed. Transabdominal technique was performed for global imaging of the pelvis including uterus, ovaries, adnexal regions, and pelvic cul-de-sac.  It was necessary to proceed  with endovaginal exam following the transabdominal exam to visualize the uterus and adnexa.  Comparison:  None  Findings:  Uterus: 12 cm x 5.8 cm x 7.6 cm.  Heterogeneous echotexture is present suggesting adenomyosis.  Fibroid is present in the lower uterine segment measuring 20 mm x 19 mm x 22 mm.  Endometrium: 9 mm.  Small amount of endometrial fluid is present, adjacent to c section uterine scar.  Right ovary:  48 mm x 17 mm x 30 mm with normal physiologic appearance.  Left ovary: 38 mm x 19 mm x 19 mm with normal physiologic appearance.  Other findings: Trace free fluid, physiologic.  IMPRESSION:  1.  Normal endometrial thickness at 9 mm.  No focal endometrial thickening.  Tiny amount of endometrial fluid is present adjacent to the scar from C-section. 2.  Fibroid in the lower uterine segment measuring about 2 cm. 3.  Heterogeneous echotexture of the uterus suggesting adenomyosis.   Original Report Authenticated By: Andreas Newport, M.D.      No diagnosis found.    MDM  Medical screening examination/treatment/procedure(s) were performed by me as the supervising physician. Scribe service was utilized for documentation only.  Pt comes in with cc of right sided tooth ache, ear ache, hearing some "suctioning type" noise in the ear intermittently and popping her  ears. She no new hearing loss, and her eat shows no perforated TM. The ear exam in fact is normal. She has mastoid area tenderness, but she has some neck muscle tenderness in the same region. She is not toxic, she is not immunocompromised, so i don't think she has mastoiditis or any other deep infection of the ear.  There is some swelling of her face  -minimal though. Dental exam does reveal eroded tooth and she has hot/cold sensitivity - but no abscess noted-  So likely a periodontal disease.  Will tx for dental infection, as i think that is likely contributing to her ear symptoms as well. We will give her ENT followup, but i have instructed her to see them in 2 weeks if her symptoms are not better. She has these non specific ear complains that they might be able to address.          Derwood Kaplan, MD 04/03/12 2224

## 2012-04-03 NOTE — ED Notes (Signed)
The pt has pain in her body the entire lt side leg arm etc.  She has a toothache  Both ears have been popping for one week.  No known injury

## 2012-04-20 ENCOUNTER — Emergency Department (HOSPITAL_COMMUNITY)
Admission: EM | Admit: 2012-04-20 | Discharge: 2012-04-20 | Disposition: A | Discharge status: Home / Self Care | Payer: Self-pay | Attending: Emergency Medicine | Admitting: Emergency Medicine

## 2012-04-20 ENCOUNTER — Encounter (HOSPITAL_COMMUNITY): Payer: Self-pay | Admitting: *Deleted

## 2012-04-20 DIAGNOSIS — J029 Acute pharyngitis, unspecified: Secondary | ICD-10-CM | POA: Insufficient documentation

## 2012-04-20 DIAGNOSIS — R51 Headache: Secondary | ICD-10-CM | POA: Insufficient documentation

## 2012-04-20 DIAGNOSIS — J069 Acute upper respiratory infection, unspecified: Secondary | ICD-10-CM | POA: Insufficient documentation

## 2012-04-20 DIAGNOSIS — J3489 Other specified disorders of nose and nasal sinuses: Secondary | ICD-10-CM | POA: Insufficient documentation

## 2012-04-20 DIAGNOSIS — Z8751 Personal history of pre-term labor: Secondary | ICD-10-CM | POA: Insufficient documentation

## 2012-04-20 DIAGNOSIS — IMO0001 Reserved for inherently not codable concepts without codable children: Secondary | ICD-10-CM | POA: Insufficient documentation

## 2012-04-20 DIAGNOSIS — R5383 Other fatigue: Secondary | ICD-10-CM | POA: Insufficient documentation

## 2012-04-20 DIAGNOSIS — Z862 Personal history of diseases of the blood and blood-forming organs and certain disorders involving the immune mechanism: Secondary | ICD-10-CM | POA: Insufficient documentation

## 2012-04-20 DIAGNOSIS — Z8679 Personal history of other diseases of the circulatory system: Secondary | ICD-10-CM | POA: Insufficient documentation

## 2012-04-20 DIAGNOSIS — Z9851 Tubal ligation status: Secondary | ICD-10-CM | POA: Insufficient documentation

## 2012-04-20 DIAGNOSIS — R5381 Other malaise: Secondary | ICD-10-CM | POA: Insufficient documentation

## 2012-04-20 DIAGNOSIS — Z8742 Personal history of other diseases of the female genital tract: Secondary | ICD-10-CM | POA: Insufficient documentation

## 2012-04-20 DIAGNOSIS — Z8659 Personal history of other mental and behavioral disorders: Secondary | ICD-10-CM | POA: Insufficient documentation

## 2012-04-20 LAB — CBC WITH DIFFERENTIAL/PLATELET
HCT: 21.6 % — ABNORMAL LOW (ref 36.0–46.0)
Hemoglobin: 6.6 g/dL — CL (ref 12.0–15.0)
Lymphocytes Relative: 22 % (ref 12–46)
Lymphs Abs: 1.3 10*3/uL (ref 0.7–4.0)
Monocytes Absolute: 0.5 10*3/uL (ref 0.1–1.0)
Monocytes Relative: 8 % (ref 3–12)
Neutro Abs: 4 10*3/uL (ref 1.7–7.7)
WBC: 5.9 10*3/uL (ref 4.0–10.5)

## 2012-04-20 LAB — URINALYSIS, ROUTINE W REFLEX MICROSCOPIC
Glucose, UA: NEGATIVE mg/dL
Hgb urine dipstick: NEGATIVE
Specific Gravity, Urine: 1.017 (ref 1.005–1.030)
Urobilinogen, UA: 1 mg/dL (ref 0.0–1.0)

## 2012-04-20 LAB — BASIC METABOLIC PANEL
CO2: 20 mEq/L (ref 19–32)
Chloride: 104 mEq/L (ref 96–112)
GFR calc non Af Amer: 84 mL/min — ABNORMAL LOW (ref >90)
Glucose, Bld: 95 mg/dL (ref 70–99)
Potassium: 3.8 mEq/L (ref 3.5–5.1)
Sodium: 135 mEq/L (ref 135–145)

## 2012-04-20 MED ORDER — ACETAMINOPHEN 325 MG PO TABS
650.0000 mg | ORAL_TABLET | Freq: Once | ORAL | Status: AC
  Administered 2012-04-20: 650 mg via ORAL
  Filled 2012-04-20: qty 2

## 2012-04-20 MED ORDER — SODIUM CHLORIDE 0.9 % IV SOLN
INTRAVENOUS | Status: DC
  Administered 2012-04-20: 19:00:00 via INTRAVENOUS

## 2012-04-20 MED ORDER — SODIUM CHLORIDE 0.9 % IV BOLUS (SEPSIS)
1000.0000 mL | Freq: Once | INTRAVENOUS | Status: AC
  Administered 2012-04-20: 1000 mL via INTRAVENOUS

## 2012-04-20 NOTE — ED Notes (Signed)
HGB 6.6 reported to [place]

## 2012-04-20 NOTE — ED Provider Notes (Signed)
History     CSN: [id number]  Arrival date & time 04/20/12  1501   First MD Initiated Contact with Patient 04/20/12 1536      Chief Complaint  Patient presents with  . Cough  . Sore Throat    (Consider location/radiation/quality/duration/timing/severity/associated sxs/prior treatment) HPI Comments: Latoya Guzman is a 36 y.o. Female  who presents for evaluation of headache, myalgias, and fatigue. She's been ill 3 days. She also has nonproductive cough, and rhinorrhea. She states she last had her blood tested 3 weeks ago and it was notable for low hemoglobin. She states, "I have pica". There are no known modifying factors. She has a sick contact at home. She thinks she got a flu vaccine. This year, but cannot remember when or where.  She was admitted to [facility] 04/02/2012, diagnosed with dysfunctional uterine bleeding, noted to have a hemoglobin 6.9; and was discharged with prescription for Provera, and told to take iron tablets. She states that her vaginal bleeding has stopped.  Patient is a 36 y.o. female presenting with cough and pharyngitis. The history is provided by the patient.  Cough  Sore Throat    Past Medical History  Diagnosis Date  . Blood transfusion 1998    Post C/S 2units  . Depression     Was on Lexapro Stopped when pregnant  . Preterm labor     PTL last two pregnancies  . Fibroid 03/2010    Noted on [place] in Dec.  . 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 Tricuspid valve regurgitation 02/27/2010  . Lupus Lupus Antigen     associated with pregnancy   . Sickle cell trait     Past Surgical History  Procedure Date  . Dilation and curettage of uterus 2005, 2006    1st for twin loss, 2nd for retained placenta  . Cesarean section 1998, 2009  . Tubal ligation   . Cesarean section 11/14/2010    Procedure: CESAREAN SECTION;  Surgeon: Hollie Salk C. Marice Potter, MD;  Location: [facility];  Service: Gynecology;   Laterality: N/A;  Repeat cesarean section with delivery of baby boy at 1000. Apgars 9/9. Bilateral tubal ligation with filshie clips.     Family History  Problem Relation Age of Onset  . Hypertension Mother   . Lupus Sister   . Diabetes Maternal Uncle   . Cancer Maternal Grandmother     History  Substance Use Topics  . Smoking status: Never Smoker   . Smokeless tobacco: Never Used  . Alcohol Use: No    OB History    Grav Para Term Preterm Abortions TAB SAB Ect Mult Living   10 8 6 2 2  2  1 7       Review of Systems  Respiratory: Positive for cough.   All other systems reviewed and are negative.    Allergies  Review of patient's allergies indicates no known allergies.  Home Medications   Current Outpatient Rx  Name  Route  Sig  Dispense  Refill  . FERROUS SULFATE 325 (65 FE) MG PO TABS   Oral   Take 325 mg by mouth 3 (three) times daily with meals.         Marland Kitchen MEDROXYPROGESTERONE ACETATE 10 MG PO TABS   Oral   Take 1 tablet (10 mg total) by mouth daily.   30 tablet   1   . PENICILLIN V POTASSIUM 250 MG PO TABS  Oral   Take 250 mg by mouth 4 (four) times daily.           BP 115/62  Pulse 87  Temp 99 F (37.2 C) (Oral)  Resp 16  SpO2 100%  LMP 03/30/2012  Physical Exam  Nursing note and vitals reviewed. Constitutional: She is oriented to person, place, and time. She appears well-developed and well-nourished.  HENT:  Head: Normocephalic and atraumatic.  Eyes: Conjunctivae normal and EOM are normal. Pupils are equal, round, and reactive to light.  Neck: Normal range of motion and phonation normal. Neck supple.  Cardiovascular: Normal rate, regular rhythm and intact distal pulses.   Pulmonary/Chest: Effort normal and breath sounds normal. She exhibits no tenderness.  Abdominal: Soft. She exhibits no distension. There is no tenderness. There is no guarding.  Musculoskeletal: Normal range of motion.  Neurological: She is alert and oriented to person,  place, and time. She has normal strength. She exhibits normal muscle tone.  Skin: Skin is warm and dry.  Psychiatric: She has a normal mood and affect. Her behavior is normal. Judgment and thought content normal.    ED Course  Procedures (including critical care time)  Labs Reviewed  CBC WITH DIFFERENTIAL - Abnormal; Notable for the following:    Hemoglobin 6.6 (*)     HCT 21.6 (*)     MCV 55.0 (*)     MCH 16.8 (*)     RDW 18.5 (*)     All other components within normal limits  BASIC METABOLIC PANEL - Abnormal; Notable for the following:    Calcium 8.2 (*)     GFR calc non Af Amer 84 (*)     All other components within normal limits  URINALYSIS, ROUTINE W REFLEX MICROSCOPIC  LAB REPORT - SCANNED   No results found.   1. URI (upper respiratory infection)   2. Pharyngitis       MDM  URI sx with fatigue and weakness d/t Iron Deficiency anemia.    Pt stabilized in CDU prior to D/C        Flint Melter, MD 04/21/12 1053

## 2012-04-20 NOTE — ED Notes (Addendum)
Patient states "chest started hurting last Wednesday, hurts with coughing".  Here today for body aches, headache, fever x last 3 days.  Given 2 NTG's and Baby ASA en route with no change.

## 2012-04-20 NOTE — ED Notes (Signed)
MD at bedside. 

## 2012-04-20 NOTE — ED Provider Notes (Signed)
Pt sent to the ER for cough and sore throat. She has a heme of 6.6 hgb which is stable from her visit on 04/02/2012 where she has been having vaginal bleeding which has slowed down since being prescribed Progesterone and iron. She also just started taking Penicillin she was prescribed on 12/9. On the 12/8 visit the pt had an ultrasound done which showed her endometrium is thickened.  Pt given 1 L of saline and advised to rest and continue drinking plenty of fluids. Pt URGED to continue to take progesterone and iron pills. She has been told that she must take them to get better.  Pt is afebrile and stable for discharge.  Pt has been advised of the symptoms that warrant their return to the ED. Patient has voiced understanding and has agreed to follow-up with the PCP or specialist.   Dorthula Matas, PA 04/20/12 1940

## 2012-04-21 NOTE — ED Provider Notes (Signed)
Medical screening examination/treatment/procedure(s) were conducted as a shared visit with non-physician practitioner(s) and myself.  I personally evaluated the patient during the encounter  Flint Melter, MD 04/21/12 1054

## 2012-04-30 ENCOUNTER — Emergency Department (HOSPITAL_COMMUNITY): Payer: Self-pay

## 2012-04-30 ENCOUNTER — Emergency Department (HOSPITAL_COMMUNITY)
Admission: EM | Admit: 2012-04-30 | Discharge: 2012-05-01 | Disposition: A | Discharge status: Home / Self Care | Payer: Self-pay | Attending: Emergency Medicine | Admitting: Emergency Medicine

## 2012-04-30 ENCOUNTER — Encounter (HOSPITAL_COMMUNITY): Payer: Self-pay | Admitting: Emergency Medicine

## 2012-04-30 DIAGNOSIS — Z8679 Personal history of other diseases of the circulatory system: Secondary | ICD-10-CM | POA: Insufficient documentation

## 2012-04-30 DIAGNOSIS — K5289 Other specified noninfective gastroenteritis and colitis: Secondary | ICD-10-CM | POA: Insufficient documentation

## 2012-04-30 DIAGNOSIS — R059 Cough, unspecified: Secondary | ICD-10-CM | POA: Insufficient documentation

## 2012-04-30 DIAGNOSIS — R197 Diarrhea, unspecified: Secondary | ICD-10-CM | POA: Insufficient documentation

## 2012-04-30 DIAGNOSIS — Z79899 Other long term (current) drug therapy: Secondary | ICD-10-CM | POA: Insufficient documentation

## 2012-04-30 DIAGNOSIS — D573 Sickle-cell trait: Secondary | ICD-10-CM | POA: Insufficient documentation

## 2012-04-30 DIAGNOSIS — R6883 Chills (without fever): Secondary | ICD-10-CM | POA: Insufficient documentation

## 2012-04-30 DIAGNOSIS — Z8659 Personal history of other mental and behavioral disorders: Secondary | ICD-10-CM | POA: Insufficient documentation

## 2012-04-30 DIAGNOSIS — K529 Noninfective gastroenteritis and colitis, unspecified: Secondary | ICD-10-CM

## 2012-04-30 DIAGNOSIS — R51 Headache: Secondary | ICD-10-CM | POA: Insufficient documentation

## 2012-04-30 DIAGNOSIS — Z3202 Encounter for pregnancy test, result negative: Secondary | ICD-10-CM | POA: Insufficient documentation

## 2012-04-30 DIAGNOSIS — R05 Cough: Secondary | ICD-10-CM | POA: Insufficient documentation

## 2012-04-30 DIAGNOSIS — R071 Chest pain on breathing: Secondary | ICD-10-CM | POA: Insufficient documentation

## 2012-04-30 DIAGNOSIS — M329 Systemic lupus erythematosus, unspecified: Secondary | ICD-10-CM | POA: Insufficient documentation

## 2012-04-30 LAB — COMPREHENSIVE METABOLIC PANEL
ALT: 20 U/L (ref 0–35)
AST: 25 U/L (ref 0–37)
Albumin: 4.1 g/dL (ref 3.5–5.2)
Alkaline Phosphatase: 70 U/L (ref 39–117)
BUN: 10 mg/dL (ref 6–23)
CO2: 22 mEq/L (ref 19–32)
Calcium: 9.8 mg/dL (ref 8.4–10.5)
Chloride: 100 mEq/L (ref 96–112)
Creatinine, Ser: 0.99 mg/dL (ref 0.50–1.10)
GFR calc Af Amer: 85 mL/min — ABNORMAL LOW (ref >90)
GFR calc non Af Amer: 74 mL/min — ABNORMAL LOW (ref >90)
Glucose, Bld: 87 mg/dL (ref 70–99)
Potassium: 3.8 mEq/L (ref 3.5–5.1)
Sodium: 135 mEq/L (ref 135–145)
Total Bilirubin: 0.5 mg/dL (ref 0.3–1.2)
Total Protein: 8.2 g/dL (ref 6.0–8.3)

## 2012-04-30 LAB — URINALYSIS, ROUTINE W REFLEX MICROSCOPIC
Bilirubin Urine: NEGATIVE
Glucose, UA: NEGATIVE mg/dL
Ketones, ur: NEGATIVE mg/dL
Leukocytes, UA: NEGATIVE
Nitrite: NEGATIVE
Protein, ur: NEGATIVE mg/dL
Specific Gravity, Urine: 1.028 (ref 1.005–1.030)
Urobilinogen, UA: 0.2 mg/dL (ref 0.0–1.0)
pH: 5.5 (ref 5.0–8.0)

## 2012-04-30 LAB — CBC WITH DIFFERENTIAL/PLATELET
Basophils Absolute: 0 10*3/uL (ref 0.0–0.1)
Basophils Relative: 0 % (ref 0–1)
Eosinophils Absolute: 0 10*3/uL (ref 0.0–0.7)
Eosinophils Relative: 0 % (ref 0–5)
HCT: 27.2 % — ABNORMAL LOW (ref 36.0–46.0)
Hemoglobin: 8.1 g/dL — ABNORMAL LOW (ref 12.0–15.0)
Lymphocytes Relative: 7 % — ABNORMAL LOW (ref 12–46)
Lymphs Abs: 1 10*3/uL (ref 0.7–4.0)
MCH: 17.2 pg — ABNORMAL LOW (ref 26.0–34.0)
MCHC: 29.8 g/dL — ABNORMAL LOW (ref 30.0–36.0)
MCV: 57.7 fL — ABNORMAL LOW (ref 78.0–100.0)
Monocytes Absolute: 0.6 10*3/uL (ref 0.1–1.0)
Monocytes Relative: 4 % (ref 3–12)
Neutro Abs: 13.2 10*3/uL — ABNORMAL HIGH (ref 1.7–7.7)
Neutrophils Relative %: 89 % — ABNORMAL HIGH (ref 43–77)
Platelets: 663 10*3/uL — ABNORMAL HIGH (ref 150–400)
RBC: 4.71 MIL/uL (ref 3.87–5.11)
RDW: 21.6 % — ABNORMAL HIGH (ref 11.5–15.5)
WBC: 14.8 10*3/uL — ABNORMAL HIGH (ref 4.0–10.5)

## 2012-04-30 LAB — URINE MICROSCOPIC-ADD ON

## 2012-04-30 LAB — PREGNANCY, URINE: Preg Test, Ur: NEGATIVE

## 2012-04-30 MED ORDER — SODIUM CHLORIDE 0.9 % IV BOLUS (SEPSIS)
2000.0000 mL | Freq: Once | INTRAVENOUS | Status: AC
  Administered 2012-04-30: 2000 mL via INTRAVENOUS

## 2012-04-30 MED ORDER — PROMETHAZINE HCL 25 MG PO TABS: 25.0000 mg | ORAL_TABLET | Freq: Three times a day (TID) | ORAL | Status: DC | PRN

## 2012-04-30 MED ORDER — KETOROLAC TROMETHAMINE 30 MG/ML IJ SOLN
30.0000 mg | Freq: Once | INTRAMUSCULAR | Status: AC
  Administered 2012-04-30: 30 mg via INTRAVENOUS
  Filled 2012-04-30: qty 1

## 2012-04-30 MED ORDER — ONDANSETRON HCL 4 MG/2ML IJ SOLN
4.0000 mg | Freq: Once | INTRAMUSCULAR | Status: AC
  Administered 2012-04-30: 4 mg via INTRAVENOUS
  Filled 2012-04-30: qty 2

## 2012-04-30 MED ORDER — ACETAMINOPHEN 325 MG PO TABS
650.0000 mg | ORAL_TABLET | Freq: Once | ORAL | Status: AC
  Administered 2012-04-30: 650 mg via ORAL
  Filled 2012-04-30: qty 2

## 2012-04-30 NOTE — ED Notes (Addendum)
C/o vaginal bleeding since December 26th.  Reports chills today and vomited x 3. Pt states she has had multiple complaints and ED visits over the past several weeks. Pt states she was suppose to return to [facility] today but was unable to go this morning.  Reports she had tubal ligation but states the RN at [facility] told her on the phone today that she needed a pregnancy test.  HGB 6.6 at last ED visit.

## 2012-04-30 NOTE — ED Provider Notes (Signed)
History     CSN: [id number]  Arrival date & time 04/30/12  7829   First MD Initiated Contact with Patient 04/30/12 2017      Chief Complaint  Patient presents with  . Emesis  . Chills  . Vaginal Bleeding    (Consider location/radiation/quality/duration/timing/severity/associated sxs/prior treatment) HPI The patient presents emergency department with nausea, vomiting, diarrhea, along with cough.  Patient states that she also has some left lateral rib pain.  She states this hurts worse when she coughs.  Patient denies shortness of breath, wheezing, abdominal pain, bloody stool, dizziness, syncope, dysuria, or back pain.  Patient states that she did not take anything prior to arrival for her symptoms.  Patient states she does have a mild headache as well Past Medical History  Diagnosis Date  . Blood transfusion 1998    Post C/S 2units  . Depression     Was on Lexapro Stopped when pregnant  . Preterm labor     PTL last two pregnancies  . Fibroid 03/2010    Noted on [place] in Dec.  . 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 Tricuspid valve regurgitation 02/27/2010  . Lupus Lupus Antigen     associated with pregnancy   . Sickle cell trait     Past Surgical History  Procedure Date  . Dilation and curettage of uterus 2005, 2006    1st for twin loss, 2nd for retained placenta  . Cesarean section 1998, 2009  . Tubal ligation   . Cesarean section 11/14/2010    Procedure: CESAREAN SECTION;  Surgeon: Hollie Salk C. Marice Potter, MD;  Location: [facility];  Service: Gynecology;  Laterality: N/A;  Repeat cesarean section with delivery of baby boy at 1000. Apgars 9/9. Bilateral tubal ligation with filshie clips.     Family History  Problem Relation Age of Onset  . Hypertension Mother   . Lupus Sister   . Diabetes Maternal Uncle   . Cancer Maternal Grandmother     History  Substance Use Topics  . Smoking status: Never Smoker   . Smokeless  tobacco: Never Used  . Alcohol Use: No    OB History    Grav Para Term Preterm Abortions TAB SAB Ect Mult Living   10 8 6 2 2  2  1 7       Review of Systems All other systems negative except as documented in the HPI. All pertinent positives and negatives as reviewed in the HPI.  Allergies  Review of patient's allergies indicates no known allergies.  Home Medications   Current Outpatient Rx  Name  Route  Sig  Dispense  Refill  . FERROUS SULFATE 325 (65 FE) MG PO TABS   Oral   Take 325 mg by mouth 3 (three) times daily with meals.         Marland Kitchen MEDROXYPROGESTERONE ACETATE 10 MG PO TABS   Oral   Take 1 tablet (10 mg total) by mouth daily.   30 tablet   1   . PENICILLIN V POTASSIUM 250 MG PO TABS   Oral   Take 250 mg by mouth 4 (four) times daily.           BP 131/77  Temp 100.6 F (38.1 C) (Oral)  Resp 24  SpO2 100%  LMP 04/20/2012  Physical Exam  Nursing note and vitals reviewed. Constitutional: She is oriented to person, place, and time. She appears well-developed and well-nourished.  HENT:  Head: Normocephalic and atraumatic.  Mouth/Throat: Oropharynx is clear and moist.  Eyes: Pupils are equal, round, and reactive to light.  Neck: Normal range of motion. Neck supple.  Cardiovascular: Normal rate, regular rhythm and normal heart sounds.   Pulmonary/Chest: Effort normal and breath sounds normal.  Abdominal: Soft. Bowel sounds are normal.  Neurological: She is alert and oriented to person, place, and time.  Skin: Skin is warm and dry. No rash noted.    ED Course  Procedures (including critical care time)  Labs Reviewed  CBC WITH DIFFERENTIAL - Abnormal; Notable for the following:    WBC 14.8 (*)     Hemoglobin 8.1 (*)     HCT 27.2 (*)     MCV 57.7 (*)     MCH 17.2 (*)     MCHC 29.8 (*)     RDW 21.6 (*)     Platelets 663 (*)     Neutrophils Relative 89 (*)     Lymphocytes Relative 7 (*)     Neutro Abs 13.2 (*)     All other components within  normal limits  COMPREHENSIVE METABOLIC PANEL - Abnormal; Notable for the following:    GFR calc non Af Amer 74 (*)     GFR calc Af Amer 85 (*)     All other components within normal limits  URINALYSIS, ROUTINE W REFLEX MICROSCOPIC - Abnormal; Notable for the following:    Hgb urine dipstick TRACE (*)     All other components within normal limits  PREGNANCY, URINE  URINE MICROSCOPIC-ADD ON   Dg Chest 2 View  04/30/2012  *RADIOLOGY REPORT*  Clinical Data: Shortness of breath, cough.  CHEST - 2 VIEW  Comparison: Plain films of the chest 12/24/2008 and CT chest 07/16/2010.  Findings: There is some peribronchial thickening.  No consolidative process is identified.  No pneumothorax or pleural fluid. Heart size is normal.  IMPRESSION: Bronchitic change without focal process.   Original Report Authenticated By: Holley Dexter, M.D.    Patient has been given IV fluids and most likely has a viral GI versus URI type illness.  Patient's hemoglobin has improved since her last visit.  Patient is stable here in the emergency department.   MDM  MDM Reviewed: vitals and nursing note Interpretation: labs           Carlyle Dolly, PA-C 04/30/12 2323

## 2012-04-30 NOTE — ED Provider Notes (Signed)
Medical screening examination/treatment/procedure(s) were performed by non-physician practitioner and as supervising physician I was immediately available for consultation/collaboration.   Dione Booze, MD 04/30/12 [phone]

## 2012-04-30 NOTE — ED Notes (Signed)
Pt reports low hemoglobin is associated with vaginal bleeding since Dec. 26th.

## 2012-05-08 ENCOUNTER — Emergency Department (HOSPITAL_COMMUNITY)
Admission: EM | Admit: 2012-05-08 | Discharge: 2012-05-09 | Disposition: A | Discharge status: Home / Self Care | Payer: Self-pay | Attending: Emergency Medicine | Admitting: Emergency Medicine

## 2012-05-08 ENCOUNTER — Emergency Department (HOSPITAL_COMMUNITY): Payer: Self-pay

## 2012-05-08 ENCOUNTER — Encounter (HOSPITAL_COMMUNITY): Payer: Self-pay | Admitting: *Deleted

## 2012-05-08 DIAGNOSIS — Z862 Personal history of diseases of the blood and blood-forming organs and certain disorders involving the immune mechanism: Secondary | ICD-10-CM | POA: Insufficient documentation

## 2012-05-08 DIAGNOSIS — J189 Pneumonia, unspecified organism: Secondary | ICD-10-CM | POA: Insufficient documentation

## 2012-05-08 DIAGNOSIS — Z8669 Personal history of other diseases of the nervous system and sense organs: Secondary | ICD-10-CM | POA: Insufficient documentation

## 2012-05-08 DIAGNOSIS — Z8659 Personal history of other mental and behavioral disorders: Secondary | ICD-10-CM | POA: Insufficient documentation

## 2012-05-08 DIAGNOSIS — Z8679 Personal history of other diseases of the circulatory system: Secondary | ICD-10-CM | POA: Insufficient documentation

## 2012-05-08 LAB — POCT I-STAT, CHEM 8
Calcium, Ion: 1.15 mmol/L (ref 1.12–1.23)
Chloride: 106 mEq/L (ref 96–112)
HCT: 24 % — ABNORMAL LOW (ref 36.0–46.0)
Hemoglobin: 8.2 g/dL — ABNORMAL LOW (ref 12.0–15.0)
Potassium: 3.8 mEq/L (ref 3.5–5.1)

## 2012-05-08 MED ORDER — ALBUTEROL SULFATE HFA 108 (90 BASE) MCG/ACT IN AERS
2.0000 | INHALATION_SPRAY | Freq: Once | RESPIRATORY_TRACT | Status: AC
  Administered 2012-05-09: 2 via RESPIRATORY_TRACT
  Filled 2012-05-08: qty 6.7

## 2012-05-08 MED ORDER — AEROCHAMBER Z-STAT PLUS/MEDIUM MISC
1.0000 | Freq: Once | Status: AC
  Administered 2012-05-09: 1

## 2012-05-08 NOTE — ED Provider Notes (Signed)
History  This chart was scribed for Arthor Captain, PA-C working with Richardean Canal, MD by Shari Heritage, ED Scribe. This patient was seen in room WTR7/WTR7 and the patient's care was started at 2105.  CSN: [id number]  Arrival date & time 05/08/12  2105   First MD Initiated Contact with Patient 05/08/12 2247      Chief Complaint  Patient presents with  . Chest Pain (rib pain)     The history is provided by the patient. No language interpreter was used.    HPI Comments: Latoya Guzman is a 37 y.o. female who presents to the Emergency Department complaining of moderate to severe, constant, left-sided rib pain onset yesterday. Patient's other symptoms include productive cough, chills, wheezes and body aches. Patient started taking coumadin and lovenox after she was diagnosed with lupus antibody, but she stopped taking it. She also says that during her last check up, she presented anemic and elevated liver enzymes. Patient has a medical history of preterm labor, mitral valve regurgitation and sickle cell trait.  Past Medical History  Diagnosis Date  . Blood transfusion 1998    Post C/S 2units  . Depression     Was on Lexapro Stopped when pregnant  . Preterm labor     PTL last two pregnancies  . Fibroid 03/2010    Noted on [place] in Dec.  . 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 Tricuspid valve regurgitation 02/27/2010  . Lupus Lupus Antigen     associated with pregnancy   . Sickle cell trait     Past Surgical History  Procedure Date  . Dilation and curettage of uterus 2005, 2006    1st for twin loss, 2nd for retained placenta  . Cesarean section 1998, 2009  . Tubal ligation   . Cesarean section 11/14/2010    Procedure: CESAREAN SECTION;  Surgeon: Hollie Salk C. Marice Potter, MD;  Location: [facility];  Service: Gynecology;  Laterality: N/A;  Repeat cesarean section with delivery of baby boy at 1000. Apgars 9/9. Bilateral tubal ligation with  filshie clips.     Family History  Problem Relation Age of Onset  . Hypertension Mother   . Lupus Sister   . Diabetes Maternal Uncle   . Cancer Maternal Grandmother     History  Substance Use Topics  . Smoking status: Never Smoker   . Smokeless tobacco: Never Used  . Alcohol Use: No    OB History    Grav Para Term Preterm Abortions TAB SAB Ect Mult Living   10 8 6 2 2  2  1 7       Review of Systems A complete 10 system review of systems was obtained and all systems are negative except as noted in the HPI and PMH.   Allergies  Review of patient's allergies indicates no known allergies.  Home Medications   Current Outpatient Rx  Name  Route  Sig  Dispense  Refill  . FERROUS SULFATE 325 (65 FE) MG PO TABS   Oral   Take 325 mg by mouth daily.          Marland Kitchen MEDROXYPROGESTERONE ACETATE 10 MG PO TABS   Oral   Take 1 tablet (10 mg total) by mouth daily.   30 tablet   1   . PROMETHAZINE HCL 25 MG PO TABS   Oral   Take 1 tablet (25 mg total) by mouth every 8 (eight) hours as  needed for nausea.   15 tablet   0     Triage Vitals: BP 114/59  Pulse 82  Temp 98.4 F (36.9 C)  Resp 20  SpO2 100%  LMP 04/20/2012  Physical Exam  Nursing note and vitals reviewed. Constitutional: She is oriented to person, place, and time. She appears well-developed and well-nourished. No distress.  HENT:  Head: Normocephalic and atraumatic.  Eyes: EOM are normal.  Neck: Neck supple. No tracheal deviation present.  Cardiovascular: Normal rate and regular rhythm.   No murmur heard. Pulmonary/Chest: Effort normal. No respiratory distress. She has decreased breath sounds in the left middle field.  Abdominal: Bowel sounds are normal. She exhibits no distension.  Musculoskeletal: Normal range of motion.  Neurological: She is alert and oriented to person, place, and time.  Skin: Skin is warm and dry.  Psychiatric: She has a normal mood and affect. Her behavior is normal.    ED Course   Procedures (including critical care time) DIAGNOSTIC STUDIES: Oxygen Saturation is 100% on room air, normal by my interpretation.    COORDINATION OF CARE: 11:51 PM- Patient informed of current plan for treatment and evaluation and agrees with plan at this time.    Labs Reviewed  POCT I-STAT, CHEM 8 - Abnormal; Notable for the following:    Glucose, Bld 118 (*)     Hemoglobin 8.2 (*)     HCT 24.0 (*)     All other components within normal limits   Dg Chest 2 View  05/08/2012  *RADIOLOGY REPORT*  Clinical Data: Cough, chest pain.  CHEST - 2 VIEW  Comparison: 04/30/2012  Findings: Patchy left infrahilar posteromedial basal   infiltrate or atelectasis, partially improved since prior exam.  Right lung remains clear.  Heart size normal.  No effusion.  Regional bones unremarkable.  IMPRESSION:  1.  Improving posterior left lower lobe infiltrate/atelectasis.   Original Report Authenticated By: D. Andria Rhein, MD      1. CAP (community acquired pneumonia)       MDM   Filed Vitals:   05/08/12 2137  BP: 114/59  Pulse: 82  Temp: 98.4 F (36.9 C)  Resp: 20  SpO2: 100%   VSS. Will d/c with tx for CAP. Patient has been instructed to f/u with both her PCP and OBGYN regarding her anticoagulation and anemia. Return precautions discussed.    I personally performed the services described in this documentation, which was scribed in my presence. The recorded information has been reviewed and is accurate.     Arthor Captain, PA-C 05/12/12 [id number]

## 2012-05-08 NOTE — ED Notes (Signed)
Pt c/o left sided rib pain; no known injury; pain started last night; cough x 1 wk; seen last wk for same

## 2012-05-09 MED ORDER — HYDROCODONE-HOMATROPINE 5-1.5 MG/5ML PO SYRP: 5.0000 mL | ORAL_SOLUTION | ORAL | Status: DC | PRN

## 2012-05-09 MED ORDER — AZITHROMYCIN 250 MG PO TABS: ORAL_TABLET | ORAL | Status: DC

## 2012-05-15 NOTE — ED Provider Notes (Signed)
Medical screening examination/treatment/procedure(s) were performed by non-physician practitioner and as supervising physician I was immediately available for consultation/collaboration.   Adriena Manfre H Shakeela Rabadan, MD 05/15/12 1514 

## 2012-08-12 ENCOUNTER — Encounter (HOSPITAL_COMMUNITY): Payer: Self-pay | Admitting: *Deleted

## 2012-08-12 ENCOUNTER — Emergency Department (HOSPITAL_COMMUNITY): Payer: Self-pay

## 2012-08-12 ENCOUNTER — Emergency Department (HOSPITAL_COMMUNITY)
Admission: EM | Admit: 2012-08-12 | Discharge: 2012-08-12 | Disposition: A | Discharge status: Home / Self Care | Payer: Self-pay | Attending: Emergency Medicine | Admitting: Emergency Medicine

## 2012-08-12 DIAGNOSIS — R1011 Right upper quadrant pain: Secondary | ICD-10-CM

## 2012-08-12 DIAGNOSIS — Z8739 Personal history of other diseases of the musculoskeletal system and connective tissue: Secondary | ICD-10-CM | POA: Insufficient documentation

## 2012-08-12 DIAGNOSIS — R112 Nausea with vomiting, unspecified: Secondary | ICD-10-CM

## 2012-08-12 DIAGNOSIS — Z8679 Personal history of other diseases of the circulatory system: Secondary | ICD-10-CM | POA: Insufficient documentation

## 2012-08-12 DIAGNOSIS — Z9851 Tubal ligation status: Secondary | ICD-10-CM | POA: Insufficient documentation

## 2012-08-12 DIAGNOSIS — Z8659 Personal history of other mental and behavioral disorders: Secondary | ICD-10-CM | POA: Insufficient documentation

## 2012-08-12 DIAGNOSIS — Z8742 Personal history of other diseases of the female genital tract: Secondary | ICD-10-CM | POA: Insufficient documentation

## 2012-08-12 DIAGNOSIS — Z862 Personal history of diseases of the blood and blood-forming organs and certain disorders involving the immune mechanism: Secondary | ICD-10-CM | POA: Insufficient documentation

## 2012-08-12 DIAGNOSIS — Z3202 Encounter for pregnancy test, result negative: Secondary | ICD-10-CM | POA: Insufficient documentation

## 2012-08-12 DIAGNOSIS — Z87798 Personal history of other (corrected) congenital malformations: Secondary | ICD-10-CM | POA: Insufficient documentation

## 2012-08-12 LAB — COMPREHENSIVE METABOLIC PANEL
ALT: 20 U/L (ref 0–35)
AST: 30 U/L (ref 0–37)
Albumin: 3.8 g/dL (ref 3.5–5.2)
Alkaline Phosphatase: 75 U/L (ref 39–117)
Potassium: 4 mEq/L (ref 3.5–5.1)
Sodium: 131 mEq/L — ABNORMAL LOW (ref 135–145)
Total Protein: 7.3 g/dL (ref 6.0–8.3)

## 2012-08-12 LAB — URINALYSIS, ROUTINE W REFLEX MICROSCOPIC
Bilirubin Urine: NEGATIVE
Hgb urine dipstick: NEGATIVE
Nitrite: NEGATIVE
Specific Gravity, Urine: 1.029 (ref 1.005–1.030)
pH: 8.5 — ABNORMAL HIGH (ref 5.0–8.0)

## 2012-08-12 LAB — POCT PREGNANCY, URINE: Preg Test, Ur: NEGATIVE

## 2012-08-12 LAB — CBC WITH DIFFERENTIAL/PLATELET
Basophils Absolute: 0 10*3/uL (ref 0.0–0.1)
Basophils Relative: 0 % (ref 0–1)
Eosinophils Absolute: 0.1 10*3/uL (ref 0.0–0.7)
MCH: 16.8 pg — ABNORMAL LOW (ref 26.0–34.0)
MCHC: 29.9 g/dL — ABNORMAL LOW (ref 30.0–36.0)
Neutrophils Relative %: 61 % (ref 43–77)
Platelets: 261 10*3/uL (ref 150–400)

## 2012-08-12 LAB — WET PREP, GENITAL: Trich, Wet Prep: NONE SEEN

## 2012-08-12 LAB — LIPASE, BLOOD: Lipase: 31 U/L (ref 11–59)

## 2012-08-12 MED ORDER — MORPHINE SULFATE 4 MG/ML IJ SOLN
4.0000 mg | Freq: Once | INTRAMUSCULAR | Status: AC
  Administered 2012-08-12: 4 mg via INTRAVENOUS
  Filled 2012-08-12: qty 1

## 2012-08-12 MED ORDER — ONDANSETRON HCL 4 MG/2ML IJ SOLN
4.0000 mg | Freq: Once | INTRAMUSCULAR | Status: AC
  Administered 2012-08-12: 4 mg via INTRAVENOUS
  Filled 2012-08-12: qty 2

## 2012-08-12 MED ORDER — ONDANSETRON 4 MG PO TBDP: 4.0000 mg | ORAL_TABLET | Freq: Three times a day (TID) | ORAL | Status: DC | PRN

## 2012-08-12 MED ORDER — SODIUM CHLORIDE 0.9 % IV BOLUS (SEPSIS)
1000.0000 mL | Freq: Once | INTRAVENOUS | Status: AC
  Administered 2012-08-12: 1000 mL via INTRAVENOUS

## 2012-08-12 NOTE — ED Notes (Signed)
Pt aware of the need for a urine sample. 

## 2012-08-12 NOTE — ED Provider Notes (Signed)
Medical screening examination/treatment/procedure(s) were performed by non-physician practitioner and as supervising physician I was immediately available for consultation/collaboration.   Tyrhonda Georgiades, MD 08/12/12 2330 

## 2012-08-12 NOTE — ED Notes (Signed)
Pelvic exam supplies setup at bedside.

## 2012-08-12 NOTE — ED Notes (Signed)
US at bedside

## 2012-08-12 NOTE — ED Notes (Signed)
Pt also c/o right thumb "twitching" for past 2 hours

## 2012-08-12 NOTE — ED Notes (Signed)
Pt reports intermittent abd pain x1 week, nausea and vomiting - reports vomiting 6 times in past 24 hours. Took advil and Pepto bismol without relief of systems

## 2012-08-12 NOTE — ED Provider Notes (Signed)
History     CSN: [id number]  Arrival date & time 08/12/12  1329   First MD Initiated Contact with Patient 08/12/12 1425      Chief Complaint  Patient presents with  . Abdominal Pain  . Emesis    (Consider location/radiation/quality/duration/timing/severity/associated sxs/prior treatment) HPI Comments: Patient presents to the ED for intermittent right upper quadrant abdominal pain x 1 week. Also endorses nausea and non-bloody vomiting, 6 episodes within the past 24 hours.  Last PO intake was a grilled hamburger and a salad. Denies eating large amounts of fatty or fried foods.  Has been unable to keep any fluids down today.  She has taken Advil and Pepto-Bismol without significant relief of her symptoms.  Patient is status post tubal ligation but is concerned for pregnancy.  Denies any new sexual contacts or concern for STDs at this time. Denies any vaginal discharge, dysuria, hematuria, diarrhea, sweats, fevers, or chills.  No hx of GB disease.  The history is provided by the patient.    Past Medical History  Diagnosis Date  . Blood transfusion 1998    Post C/S 2units  . Depression     Was on Lexapro Stopped when pregnant  . Preterm labor     PTL last two pregnancies  . Fibroid 03/2010    Noted on [place] in Dec.  . 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 Tricuspid valve regurgitation 02/27/2010  . Lupus Lupus Antigen     associated with pregnancy   . Sickle cell trait     Past Surgical History  Procedure Laterality Date  . Dilation and curettage of uterus  2005, 2006    1st for twin loss, 2nd for retained placenta  . Cesarean section  1998, 2009  . Tubal ligation    . Cesarean section  11/14/2010    Procedure: CESAREAN SECTION;  Surgeon: Hollie Salk C. Marice Potter, MD;  Location: [facility];  Service: Gynecology;  Laterality: N/A;  Repeat cesarean section with delivery of baby boy at 1000. Apgars 9/9. Bilateral tubal ligation with  filshie clips.     Family History  Problem Relation Age of Onset  . Hypertension Mother   . Lupus Sister   . Diabetes Maternal Uncle   . Cancer Maternal Grandmother     History  Substance Use Topics  . Smoking status: Never Smoker   . Smokeless tobacco: Never Used  . Alcohol Use: No    OB History   Grav Para Term Preterm Abortions TAB SAB Ect Mult Living   10 8 6 2 2  2  1 7       Review of Systems  Gastrointestinal: Positive for nausea, vomiting and abdominal pain.  All other systems reviewed and are negative.    Allergies  Review of patient's allergies indicates no known allergies.  Home Medications   Current Outpatient Rx  Name  Route  Sig  Dispense  Refill  . ibuprofen (ADVIL,MOTRIN) 200 MG tablet   Oral   Take 400 mg by mouth every 6 (six) hours as needed for pain.           BP 109/67  Pulse 83  Temp(Src) 98.3 F (36.8 C) (Oral)  Resp 20  SpO2 99%  LMP 07/26/2012  Physical Exam  Nursing note and vitals reviewed. Constitutional: She is oriented to person, place, and time. She appears well-developed and well-nourished.  HENT:  Head: Normocephalic and atraumatic.  Mouth/Throat: Oropharynx is  clear and moist.  Eyes: Conjunctivae and EOM are normal. Pupils are equal, round, and reactive to light.  Neck: Normal range of motion.  Cardiovascular: Normal rate, regular rhythm and normal heart sounds.   Pulmonary/Chest: Effort normal and breath sounds normal. No respiratory distress.  Abdominal: Soft. Bowel sounds are normal. There is tenderness in the right upper quadrant and epigastric area. There is no CVA tenderness, no tenderness at McBurney's point and negative Murphy's sign.  Musculoskeletal: Normal range of motion.  Neurological: She is alert and oriented to person, place, and time.  Skin: Skin is warm and dry.  Psychiatric: She has a normal mood and affect.    ED Course  Procedures (including critical care time)  Labs Reviewed  WET PREP,  GENITAL - Abnormal; Notable for the following:    Clue Cells Wet Prep HPF POC FEW (*)    WBC, Wet Prep HPF POC FEW (*)    All other components within normal limits  CBC WITH DIFFERENTIAL - Abnormal; Notable for the following:    WBC 3.3 (*)    Hemoglobin 7.2 (*)    HCT 24.1 (*)    MCV 56.3 (*)    MCH 16.8 (*)    MCHC 29.9 (*)    RDW 18.3 (*)    All other components within normal limits  COMPREHENSIVE METABOLIC PANEL - Abnormal; Notable for the following:    Sodium 131 (*)    GFR calc non Af Amer 86 (*)    All other components within normal limits  URINALYSIS, ROUTINE W REFLEX MICROSCOPIC - Abnormal; Notable for the following:    pH 8.5 (*)    Protein, ur 30 (*)    Leukocytes, UA SMALL (*)    All other components within normal limits  URINE MICROSCOPIC-ADD ON - Abnormal; Notable for the following:    Squamous Epithelial / LPF FEW (*)    All other components within normal limits  GC/CHLAMYDIA PROBE AMP  URINE CULTURE  LIPASE, BLOOD  PREGNANCY, URINE  POCT PREGNANCY, URINE   US Abdomen Complete  08/12/2012  *RADIOLOGY REPORT*  Clinical Data:  37 year old female with right upper quadrant pain nausea and vomiting.  COMPLETE ABDOMINAL ULTRASOUND  Comparison:  None.  Findings:  Gallbladder:  No gallstones, gallbladder wall thickening, or pericholecystic fluid. No sonographic Murphy's sign elicited.  Common bile duct:  Normal measuring 4 mm diameter.  Liver:  Hepatic echotexture at the upper limits of normal.  No intrahepatic ductal dilatation or focal liver lesion.  IVC:  Appears normal.  Pancreas:  No focal abnormality seen.  Spleen:  Normal measuring 8.4 cm in length.  Right Kidney:  Normal measuring 11.7 cm in length.  Left Kidney:  Normal measuring 11.4 cm in length.  Abdominal aorta:  No aneurysm identified.  IMPRESSION: Negative gallbladder.  Normal abdominal ultrasound.   Original Report Authenticated By: Erskine Speed, M.D.      1. RUQ abdominal pain   2. Nausea and vomiting        MDM   Patient presenting to the ED for right upper quadrant abdominal pain for one week associated with nausea and vomiting.  No active vomiting while in the ED.    Labs largely normal. U-preg negative. UA without obvious infection, culture pending.  Wet prep was few clue cells, however do not feel like this indicates infection.  Abdominal ultrasound negative for cholecystitis or cholelithiasis.  Patient afebrile, nontoxic appearing, and vital signs are stable. Tolerating PO fluids prior to d/c.  She will be discharged with Rx Zofran. Follow up with her primary care physician, Dr. Earnest Bailey, if symptoms do not improve.  Discussed plan with patient, she agreed. Return precautions advised.       Garlon Hatchet, PA-C 08/12/12 1933  Garlon Hatchet, PA-C 08/12/12 [phone]

## 2012-08-13 LAB — GC/CHLAMYDIA PROBE AMP: GC Probe RNA: NEGATIVE

## 2012-08-14 LAB — URINE CULTURE: Colony Count: 3000

## 2012-08-25 ENCOUNTER — Encounter: Payer: Self-pay | Admitting: *Deleted

## 2012-09-20 ENCOUNTER — Encounter (HOSPITAL_COMMUNITY): Payer: Self-pay | Admitting: *Deleted

## 2012-09-20 ENCOUNTER — Emergency Department (HOSPITAL_COMMUNITY)
Admission: EM | Admit: 2012-09-20 | Discharge: 2012-09-20 | Disposition: A | Discharge status: Home / Self Care | Payer: Self-pay | Attending: Emergency Medicine | Admitting: Emergency Medicine

## 2012-09-20 DIAGNOSIS — Z8659 Personal history of other mental and behavioral disorders: Secondary | ICD-10-CM | POA: Insufficient documentation

## 2012-09-20 DIAGNOSIS — Z5189 Encounter for other specified aftercare: Secondary | ICD-10-CM | POA: Insufficient documentation

## 2012-09-20 DIAGNOSIS — Z8742 Personal history of other diseases of the female genital tract: Secondary | ICD-10-CM | POA: Insufficient documentation

## 2012-09-20 DIAGNOSIS — Z872 Personal history of diseases of the skin and subcutaneous tissue: Secondary | ICD-10-CM | POA: Insufficient documentation

## 2012-09-20 DIAGNOSIS — R21 Rash and other nonspecific skin eruption: Secondary | ICD-10-CM

## 2012-09-20 DIAGNOSIS — Z8679 Personal history of other diseases of the circulatory system: Secondary | ICD-10-CM | POA: Insufficient documentation

## 2012-09-20 DIAGNOSIS — D649 Anemia, unspecified: Secondary | ICD-10-CM | POA: Insufficient documentation

## 2012-09-20 DIAGNOSIS — M7989 Other specified soft tissue disorders: Secondary | ICD-10-CM | POA: Insufficient documentation

## 2012-09-20 LAB — TYPE AND SCREEN
ABO/RH(D): B POS
Antibody Screen: NEGATIVE

## 2012-09-20 LAB — CBC WITH DIFFERENTIAL/PLATELET
Basophils Absolute: 0 10*3/uL (ref 0.0–0.1)
Eosinophils Absolute: 0 10*3/uL (ref 0.0–0.7)
HCT: 21.9 % — ABNORMAL LOW (ref 36.0–46.0)
Lymphs Abs: 1.2 10*3/uL (ref 0.7–4.0)
MCH: 16.6 pg — ABNORMAL LOW (ref 26.0–34.0)
MCHC: 29.7 g/dL — ABNORMAL LOW (ref 30.0–36.0)
MCV: 56 fL — ABNORMAL LOW (ref 78.0–100.0)
Monocytes Absolute: 0.3 10*3/uL (ref 0.1–1.0)
Neutro Abs: 2.8 10*3/uL (ref 1.7–7.7)
RDW: 19 % — ABNORMAL HIGH (ref 11.5–15.5)

## 2012-09-20 LAB — RETICULOCYTES
RBC.: 3.84 MIL/uL — ABNORMAL LOW (ref 3.87–5.11)
Retic Count, Absolute: 38.4 10*3/uL (ref 19.0–186.0)
Retic Ct Pct: 1 % (ref 0.4–3.1)

## 2012-09-20 LAB — COMPREHENSIVE METABOLIC PANEL
Albumin: 3.6 g/dL (ref 3.5–5.2)
BUN: 13 mg/dL (ref 6–23)
Creatinine, Ser: 0.9 mg/dL (ref 0.50–1.10)
GFR calc Af Amer: >90 mL/min (ref >90)
Total Protein: 7.2 g/dL (ref 6.0–8.3)

## 2012-09-20 LAB — VITAMIN B12: Vitamin B-12: 711 pg/mL (ref 211–911)

## 2012-09-20 LAB — IRON AND TIBC: UIBC: 461 ug/dL — ABNORMAL HIGH (ref 125–400)

## 2012-09-20 LAB — PROTIME-INR: INR: 1.13 (ref 0.00–1.49)

## 2012-09-20 LAB — FOLATE: Folate: 11.9 ng/mL

## 2012-09-20 LAB — APTT: aPTT: 37 seconds (ref 24–37)

## 2012-09-20 MED ORDER — FERROUS SULFATE 325 (65 FE) MG PO TABS: 325.0000 mg | ORAL_TABLET | Freq: Every day | ORAL | Status: DC

## 2012-09-20 NOTE — ED Notes (Addendum)
Linwood Dibbles, MD, made aware of hemoglobin value of 6.5.

## 2012-09-20 NOTE — ED Provider Notes (Addendum)
History     CSN: [id number]  Arrival date & time 09/20/12  1013   First MD Initiated Contact with Patient 09/20/12 1023      Chief Complaint  Patient presents with  . Rash  . reddness and swelling to legs    HPI Pt has history of a rash that started 3 days ago.  She has noticed red bumps on bilateral lower extremities that are sore to the touch.  She also noticed that her skin is sensitive to the light and to water.  No fevers.  No chest pain or shortness of breath.  She has a history of lupus antibody.  In the past she had purpura and wanted to get this checked out.    No fevers.  No other complaints.  Past Medical History  Diagnosis Date  . Blood transfusion 1998    Post C/S 2units  . Depression     Was on Lexapro Stopped when pregnant  . Preterm labor     PTL last two pregnancies  . Fibroid 03/2010    Noted on [place] in Dec.  . 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 Tricuspid valve regurgitation 02/27/2010  . Lupus Lupus Antigen     associated with pregnancy   . Sickle cell trait     Past Surgical History  Procedure Laterality Date  . Dilation and curettage of uterus  2005, 2006    1st for twin loss, 2nd for retained placenta  . Cesarean section  1998, 2009  . Tubal ligation    . Cesarean section  11/14/2010    Procedure: CESAREAN SECTION;  Surgeon: Hollie Salk C. Marice Potter, MD;  Location: [facility];  Service: Gynecology;  Laterality: N/A;  Repeat cesarean section with delivery of baby boy at 1000. Apgars 9/9. Bilateral tubal ligation with filshie clips.     Family History  Problem Relation Age of Onset  . Hypertension Mother   . Lupus Sister   . Diabetes Maternal Uncle   . Cancer Maternal Grandmother     History  Substance Use Topics  . Smoking status: Never Smoker   . Smokeless tobacco: Never Used  . Alcohol Use: No    OB History   Grav Para Term Preterm Abortions TAB SAB Ect Mult Living   10 8 6 2 2  2  1 7       Review of Systems  All other systems reviewed and are negative.    Allergies  Review of patient's allergies indicates no known allergies.  Home Medications  No current outpatient prescriptions on file.  BP 118/56  Pulse 96  Temp(Src) 98.4 F (36.9 C) (Oral)  Resp 20  SpO2 96%  Physical Exam  Nursing note and vitals reviewed. Constitutional: She appears well-developed and well-nourished. No distress.  HENT:  Head: Normocephalic and atraumatic.  Right Ear: External ear normal.  Left Ear: External ear normal.  Eyes: Conjunctivae are normal. Right eye exhibits no discharge. Left eye exhibits no discharge. No scleral icterus.  Neck: Neck supple. No tracheal deviation present.  Cardiovascular: Normal rate, regular rhythm and intact distal pulses.   Pulmonary/Chest: Effort normal and breath sounds normal. No stridor. No respiratory distress. She has no wheezes. She has no rales.  Abdominal: Soft. Bowel sounds are normal. She exhibits no distension. There is no tenderness. There is no rebound and no guarding.  Musculoskeletal: She exhibits tenderness. She exhibits no edema.  Neurological: She is alert.  She has normal strength. No sensory deficit. Cranial nerve deficit:  no gross defecits noted. She exhibits normal muscle tone. She displays no seizure activity. Coordination normal.  Skin: Skin is warm and dry. Rash noted. No petechiae and no purpura noted. Rash is papular. There is erythema.  Slightly raised tender erythematous lesions bilateral lower extrem, no lymphangitic streaking  Psychiatric: She has a normal mood and affect.    ED Course  Procedures (including critical care time)  Labs Reviewed  CBC WITH DIFFERENTIAL - Abnormal; Notable for the following:    Hemoglobin 6.5 (*)    HCT 21.9 (*)    MCV 56.0 (*)    MCH 16.6 (*)    MCHC 29.7 (*)    RDW 19.0 (*)    All other components within normal limits  COMPREHENSIVE METABOLIC PANEL - Abnormal; Notable for the  following:    GFR calc non Af Amer 82 (*)    All other components within normal limits  VITAMIN B12  FOLATE  IRON AND TIBC  FERRITIN  RETICULOCYTES  PROTIME-INR  APTT  TYPE AND SCREEN   No results found.   1. Anemia   2. Rash       MDM  Rash No sign of thrombocytopenia.  Non specific rash.  Recc outpatient follow up.  Could be a vasculitis but no other systemic symptoms other than her chronic anemia.    Anemia Pt has history of anemia.  She has been 6.6 in December.  Never required transfusions.  Was started on iron.  Has history of heavy menses.  Pt has not seen her doctor as instructed for follow up and is not taking her iron tablets.   Pt feels well.  Asymptomatic.  Will start her on her iron and have her follow up with PCP closesly vs blood transfusion today.  D/w Dr. Donnamarie Rossetti from family medicine.  He will have the patient called for close follow up at the [facility] office.        Celene Kras, MD 09/20/12 1259  Celene Kras, MD 09/20/12 [phone]

## 2012-09-20 NOTE — ED Notes (Signed)
Pt reports bil lower leg rash, reddness and swelling x2 days. Fall 2012 had same issue and dx with purpurea and liver damage Hx of lupus and at times has been on blood thinners, has not been on blood thinners in over a year. Pain 7/10

## 2012-10-10 ENCOUNTER — Emergency Department (HOSPITAL_COMMUNITY)
Admission: EM | Admit: 2012-10-10 | Discharge: 2012-10-11 | Disposition: A | Discharge status: Home / Self Care | Payer: Medicaid Other | Attending: Emergency Medicine | Admitting: Emergency Medicine

## 2012-10-10 ENCOUNTER — Encounter (HOSPITAL_COMMUNITY): Payer: Self-pay

## 2012-10-10 DIAGNOSIS — R200 Anesthesia of skin: Secondary | ICD-10-CM

## 2012-10-10 DIAGNOSIS — Z8742 Personal history of other diseases of the female genital tract: Secondary | ICD-10-CM | POA: Insufficient documentation

## 2012-10-10 DIAGNOSIS — Z8739 Personal history of other diseases of the musculoskeletal system and connective tissue: Secondary | ICD-10-CM | POA: Insufficient documentation

## 2012-10-10 DIAGNOSIS — L299 Pruritus, unspecified: Secondary | ICD-10-CM | POA: Insufficient documentation

## 2012-10-10 DIAGNOSIS — Z8751 Personal history of pre-term labor: Secondary | ICD-10-CM | POA: Insufficient documentation

## 2012-10-10 DIAGNOSIS — Z9119 Patient's noncompliance with other medical treatment and regimen: Secondary | ICD-10-CM | POA: Insufficient documentation

## 2012-10-10 DIAGNOSIS — Z8659 Personal history of other mental and behavioral disorders: Secondary | ICD-10-CM | POA: Insufficient documentation

## 2012-10-10 DIAGNOSIS — Z91199 Patient's noncompliance with other medical treatment and regimen due to unspecified reason: Secondary | ICD-10-CM | POA: Insufficient documentation

## 2012-10-10 DIAGNOSIS — Z8679 Personal history of other diseases of the circulatory system: Secondary | ICD-10-CM | POA: Insufficient documentation

## 2012-10-10 DIAGNOSIS — D573 Sickle-cell trait: Secondary | ICD-10-CM | POA: Insufficient documentation

## 2012-10-10 DIAGNOSIS — R609 Edema, unspecified: Secondary | ICD-10-CM | POA: Insufficient documentation

## 2012-10-10 DIAGNOSIS — R209 Unspecified disturbances of skin sensation: Secondary | ICD-10-CM | POA: Insufficient documentation

## 2012-10-10 DIAGNOSIS — W57XXXA Bitten or stung by nonvenomous insect and other nonvenomous arthropods, initial encounter: Secondary | ICD-10-CM

## 2012-10-10 LAB — CBC WITH DIFFERENTIAL/PLATELET
Eosinophils Relative: 2 % (ref 0–5)
Lymphs Abs: 1.9 10*3/uL (ref 0.7–4.0)
MCV: 55.9 fL — ABNORMAL LOW (ref 78.0–100.0)
Monocytes Relative: 8 % (ref 3–12)
Neutrophils Relative %: 46 % (ref 43–77)
Platelets: 216 10*3/uL (ref 150–400)
RBC: 3.7 MIL/uL — ABNORMAL LOW (ref 3.87–5.11)
WBC: 4.4 10*3/uL (ref 4.0–10.5)

## 2012-10-10 LAB — BASIC METABOLIC PANEL
CO2: 24 mEq/L (ref 19–32)
Chloride: 105 mEq/L (ref 96–112)
Potassium: 3.9 mEq/L (ref 3.5–5.1)
Sodium: 137 mEq/L (ref 135–145)

## 2012-10-10 MED ORDER — DIPHENHYDRAMINE HCL 25 MG PO CAPS: 25.0000 mg | ORAL_CAPSULE | Freq: Three times a day (TID) | ORAL | Status: DC

## 2012-10-10 MED ORDER — FAMOTIDINE 20 MG PO TABS
20.0000 mg | ORAL_TABLET | Freq: Once | ORAL | Status: AC
  Administered 2012-10-10: 20 mg via ORAL
  Filled 2012-10-10: qty 1

## 2012-10-10 MED ORDER — DIPHENHYDRAMINE HCL 25 MG PO CAPS
25.0000 mg | ORAL_CAPSULE | Freq: Once | ORAL | Status: AC
  Administered 2012-10-10: 25 mg via ORAL
  Filled 2012-10-10: qty 1

## 2012-10-10 MED ORDER — FAMOTIDINE 20 MG PO TABS: 20.0000 mg | ORAL_TABLET | Freq: Two times a day (BID) | ORAL | Status: DC

## 2012-10-10 NOTE — ED Provider Notes (Signed)
History     CSN: [id number]  Arrival date & time 10/10/12  2150   First MD Initiated Contact with Patient 10/10/12 2156      Chief Complaint  Patient presents with  . Numbness    (Consider location/radiation/quality/duration/timing/severity/associated sxs/prior treatment) HPI Patient presents with multiple complaints. Patient's primary complaint seems to be swelling and discomfort about the right hand and right foot. This seems to have begun several days ago, without clear precipitant.  Since onset there has been edema in both distal extremities, with dysesthesia in the distal right foot, dysesthesia about the posterior thenar eminence.  There is no discoloration, no new wounds. No clear alleviating or sensory factors for these concerns. Secondary complaints the left elbow discomfort. She states that yesterday she developed erythema, itchiness and at a group of insect bites.  This has progressed since yesterday with no relief from anything. Patient also has concerns of a rash which was previously present on both shins.  Is not currently present, but the patient states that she is worried it may come back. While all of these things are occurring, the patient has no new fever, confusion, disorientation, chest pain, dyspnea, syncope, nausea, vomiting, diarrhea. She states that she is noncompliant with medication, has not followed up with her primary care physician, but has been seen in the emergency department multiple times over the past months for multiple complaints Past Medical History  Diagnosis Date  . Blood transfusion 1998    Post C/S 2units  . Depression     Was on Lexapro Stopped when pregnant  . Preterm labor     PTL last two pregnancies  . Fibroid 03/2010    Noted on [place] in Dec.  . 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 Tricuspid valve regurgitation 02/27/2010  . Lupus Lupus Antigen     associated with  pregnancy   . Sickle cell trait     Past Surgical History  Procedure Laterality Date  . Dilation and curettage of uterus  2005, 2006    1st for twin loss, 2nd for retained placenta  . Cesarean section  1998, 2009  . Tubal ligation    . Cesarean section  11/14/2010    Procedure: CESAREAN SECTION;  Surgeon: Hollie Salk C. Marice Potter, MD;  Location: [facility];  Service: Gynecology;  Laterality: N/A;  Repeat cesarean section with delivery of baby boy at 1000. Apgars 9/9. Bilateral tubal ligation with filshie clips.     Family History  Problem Relation Age of Onset  . Hypertension Mother   . Lupus Sister   . Diabetes Maternal Uncle   . Cancer Maternal Grandmother     History  Substance Use Topics  . Smoking status: Never Smoker   . Smokeless tobacco: Never Used  . Alcohol Use: No    OB History   Grav Para Term Preterm Abortions TAB SAB Ect Mult Living   10 8 6 2 2  2  1 7       Review of Systems  Constitutional:       Per HPI, otherwise negative  HENT:       Per HPI, otherwise negative  Respiratory:       Per HPI, otherwise negative  Cardiovascular:       Per HPI, otherwise negative  Gastrointestinal: Negative for vomiting.  Endocrine:       Negative aside from HPI  Genitourinary:       Neg aside  from HPI   Musculoskeletal:       Per HPI, otherwise negative  Skin:       History of present illness  Neurological: Negative for syncope.    Allergies  Review of patient's allergies indicates no known allergies.  Home Medications   Current Outpatient Rx  Name  Route  Sig  Dispense  Refill  . ferrous sulfate 325 (65 FE) MG tablet   Oral   Take 1 tablet (325 mg total) by mouth daily.   30 tablet   0     BP 131/76  Pulse 87  Temp(Src) 98 F (36.7 C) (Oral)  SpO2 100%  LMP 10/06/2012  Breastfeeding? No  Physical Exam  Nursing note and vitals reviewed. Constitutional: She is oriented to person, place, and time. She appears well-developed and well-nourished. No distress.   HENT:  Head: Normocephalic and atraumatic.  Eyes: Conjunctivae and EOM are normal.  Cardiovascular: Normal rate and regular rhythm.   Pulmonary/Chest: Effort normal and breath sounds normal. No stridor. No respiratory distress.  Abdominal: She exhibits no distension.  Musculoskeletal: She exhibits no edema.  The right and left hands are symmetric, and her strength is appropriate, cap refill is appropriate, range of motion is appropriate. There is mild tenderness to palpation about the posterior thenar eminence and the distal radial styloid.  No erythema, no drainage, no treatment, no induration. The right foot is mildly more edematous than the left, distal pulses are appropriate, range of motion is appropriate, there is mild pain subjectively about the distal toes, but no objective tenderness to palpation.  Ankles are unremarkable.  Shins are unremarkable.  Neurological: She is alert and oriented to person, place, and time. No cranial nerve deficit.  Skin: Skin is warm and dry.  Psychiatric: She has a normal mood and affect.    ED Course  Procedures (including critical care time)  Labs Reviewed  CBC WITH DIFFERENTIAL  BASIC METABOLIC PANEL   No results found.   No diagnosis found.  On repeat exam the patient appears calm.  Update: Patient's hemoglobin of 6.1.  A review of the chart*patient's hemoglobin in this range, slightly higher in the past 4 months.  She states that she has previously been told she needs to take iron supplements, has not done so. On repeat exam she denies new fatigue, lightheadedness, chest pain, dyspnea, states that she feels good, and is smiling throughout this conversation.  The patient defers transfusion.  This seems reasonable given her chronic anemia.  MDM  This patient presents with multiple complaints.  On exam she is awake and alert, afebrile, clinically stable, and clearly is in no distress.  The patient has multiple medical problems, notably lupus  anticoagulant, and states that she is medication noncompliant. Patient's labs are notable for evidence of anemia, chronic.  She was started on a course of medication for likely tendinitis of the right wrist, and inflammation of the left elbow secondary to insect bites.  Absent distress, fever, there is low suspicion for new acute systemic pathology.  I had a lengthy discussion with the patient and her mother about the need for primary care followup, particularly to evaluate for her anemia, as well as lupus anticoagulant and possible need for anticoagulation.        Gerhard Munch, MD 10/10/12 [phone]

## 2012-10-10 NOTE — ED Notes (Signed)
Pt has c/o of R hand and R foot numbing, unknown bumps on R elbow area and rash that comes and goes on R lower leg. No weeping or drainage not to R elbow. Pt states that it itches and is warm to touch. Tingling sensation to toes from L leg and L hand feel "dead". Pt alert and orient.

## 2012-10-10 NOTE — ED Notes (Signed)
Dr Jeraldine Loots made aware of hgb 6.1

## 2012-11-27 DIAGNOSIS — R0789 Other chest pain: Secondary | ICD-10-CM | POA: Insufficient documentation

## 2012-11-27 DIAGNOSIS — G44209 Tension-type headache, unspecified, not intractable: Secondary | ICD-10-CM | POA: Insufficient documentation

## 2012-11-27 DIAGNOSIS — Z862 Personal history of diseases of the blood and blood-forming organs and certain disorders involving the immune mechanism: Secondary | ICD-10-CM | POA: Insufficient documentation

## 2012-11-27 DIAGNOSIS — Z79899 Other long term (current) drug therapy: Secondary | ICD-10-CM | POA: Insufficient documentation

## 2012-11-27 DIAGNOSIS — M25519 Pain in unspecified shoulder: Secondary | ICD-10-CM | POA: Insufficient documentation

## 2012-11-27 DIAGNOSIS — Q248 Other specified congenital malformations of heart: Secondary | ICD-10-CM | POA: Insufficient documentation

## 2012-11-27 DIAGNOSIS — Z8679 Personal history of other diseases of the circulatory system: Secondary | ICD-10-CM | POA: Insufficient documentation

## 2012-11-27 DIAGNOSIS — Z8742 Personal history of other diseases of the female genital tract: Secondary | ICD-10-CM | POA: Insufficient documentation

## 2012-11-27 DIAGNOSIS — Z8751 Personal history of pre-term labor: Secondary | ICD-10-CM | POA: Insufficient documentation

## 2012-11-28 ENCOUNTER — Emergency Department (HOSPITAL_COMMUNITY): Payer: Medicaid Other

## 2012-11-28 ENCOUNTER — Encounter (HOSPITAL_COMMUNITY): Payer: Self-pay

## 2012-11-28 ENCOUNTER — Emergency Department (HOSPITAL_COMMUNITY)
Admission: EM | Admit: 2012-11-28 | Discharge: 2012-11-28 | Disposition: A | Discharge status: Home / Self Care | Payer: Medicaid Other | Attending: Emergency Medicine | Admitting: Emergency Medicine

## 2012-11-28 DIAGNOSIS — R079 Chest pain, unspecified: Secondary | ICD-10-CM

## 2012-11-28 DIAGNOSIS — G44209 Tension-type headache, unspecified, not intractable: Secondary | ICD-10-CM

## 2012-11-28 LAB — POCT I-STAT TROPONIN I

## 2012-11-28 LAB — POCT I-STAT, CHEM 8
BUN: 12 mg/dL (ref 6–23)
Chloride: 105 mEq/L (ref 96–112)
Creatinine, Ser: 1.1 mg/dL (ref 0.50–1.10)
Potassium: 3.9 mEq/L (ref 3.5–5.1)
Sodium: 140 mEq/L (ref 135–145)

## 2012-11-28 MED ORDER — CYCLOBENZAPRINE HCL 10 MG PO TABS: 10.0000 mg | ORAL_TABLET | Freq: Two times a day (BID) | ORAL | Status: DC | PRN

## 2012-11-28 MED ORDER — ETODOLAC 500 MG PO TABS: 500.0000 mg | ORAL_TABLET | Freq: Two times a day (BID) | ORAL | Status: DC

## 2012-11-28 NOTE — ED Notes (Signed)
Family at bedside. 

## 2012-11-28 NOTE — ED Notes (Signed)
0400  Pt is asleep at this time but will continue to monitor the pt

## 2012-11-28 NOTE — ED Provider Notes (Signed)
CSN: [id number]     Arrival date & time 11/27/12  2353 History     First MD Initiated Contact with Patient 11/28/12 [phone]     Chief Complaint  Patient presents with  . Headache  . Shoulder Pain   HPI Patient presents to the emergency room with complaints of headache as well as chest and shoulder pain. She states the headache is sharp in the back of her head as well as the paraspinal muscles of her neck. At times she will feel sharp pain shoot down towards her shoulder. She denies any nausea vomiting, fever or or light sensitivity. That has been going on for several days.  She also complains of intermittent pain in her chest over the last few weeks. It is sharp. She does not have any trouble with her breathing. She doesn't have any fevers chills cough. Patient does not have history of heart disease, or blood clots. Past Medical History  Diagnosis Date  . Blood transfusion 1998    Post C/S 2units  . Depression     Was on Lexapro Stopped when pregnant  . Preterm labor     PTL last two pregnancies  . Fibroid 03/2010    Noted on [place] in Dec.  . 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 Tricuspid valve regurgitation 02/27/2010  . Lupus Lupus Antigen     associated with pregnancy   . Sickle cell trait    Past Surgical History  Procedure Laterality Date  . Dilation and curettage of uterus  2005, 2006    1st for twin loss, 2nd for retained placenta  . Cesarean section  1998, 2009  . Tubal ligation    . Cesarean section  11/14/2010    Procedure: CESAREAN SECTION;  Surgeon: Hollie Salk C. Marice Potter, MD;  Location: [facility];  Service: Gynecology;  Laterality: N/A;  Repeat cesarean section with delivery of baby boy at 1000. Apgars 9/9. Bilateral tubal ligation with filshie clips.    Family History  Problem Relation Age of Onset  . Hypertension Mother   . Lupus Sister   . Diabetes Maternal Uncle   . Cancer Maternal Grandmother    History   Substance Use Topics  . Smoking status: Never Smoker   . Smokeless tobacco: Never Used  . Alcohol Use: No   OB History   Grav Para Term Preterm Abortions TAB SAB Ect Mult Living   10 8 6 2 2  2  1 7      Review of Systems  All other systems reviewed and are negative.    Allergies  Review of patient's allergies indicates no known allergies.  Home Medications   Current Outpatient Rx  Name  Route  Sig  Dispense  Refill  . cyclobenzaprine (FLEXERIL) 10 MG tablet   Oral   Take 1 tablet (10 mg total) by mouth 2 (two) times daily as needed for muscle spasms.   20 tablet   0   . etodolac (LODINE) 500 MG tablet   Oral   Take 1 tablet (500 mg total) by mouth 2 (two) times daily.   20 tablet   0    BP 121/55  Pulse 65  Temp(Src) 98.5 F (36.9 C) (Oral)  Resp 13  SpO2 100%  LMP 11/28/2012 Physical Exam  Nursing note and vitals reviewed. Constitutional: She appears well-developed and well-nourished. No distress.  HENT:  Head: Normocephalic and atraumatic.  Right Ear: External ear normal.  Left Ear: External ear normal.  Eyes: Conjunctivae are normal. Right eye exhibits no discharge. Left eye exhibits no discharge. No scleral icterus.  Neck: Neck supple. No tracheal deviation present.  Cardiovascular: Normal rate, regular rhythm and intact distal pulses.   Pulmonary/Chest: Effort normal and breath sounds normal. No stridor. No respiratory distress. She has no wheezes. She has no rales. She exhibits no tenderness.  Abdominal: Soft. Bowel sounds are normal. She exhibits no distension. There is no tenderness. There is no rebound and no guarding.  Musculoskeletal: She exhibits no edema and no tenderness.  Paraspinal tenderness in the cervical spine, no erythema or edema  Neurological: She is alert. She has normal strength. No sensory deficit. Cranial nerve deficit:  no gross defecits noted. She exhibits normal muscle tone. She displays no seizure activity. Coordination normal.   Skin: Skin is warm and dry. No rash noted.  Psychiatric: She has a normal mood and affect.    ED Course   Rate: 69  Rhythm: normal sinus rhythm  QRS Axis: normal  Intervals: normal  ST/T Wave abnormalities: normal  Conduction Disutrbances:none  Narrative Interpretation:   Old EKG Reviewed: none available  Procedures (including critical care time)  Labs Reviewed  POCT I-STAT, CHEM 8 - Abnormal; Notable for the following:    Glucose, Bld 101 (*)    Calcium, Ion 1.24 (*)    Hemoglobin 7.1 (*)    HCT 21.0 (*)    All other components within normal limits  POCT I-STAT TROPONIN I   Dg Chest 2 View  11/28/2012   *RADIOLOGY REPORT*  Clinical Data: Chest and left shoulder pain.  Sickle cell disease.  CHEST - 2 VIEW  Comparison: PA and lateral chest 05/08/2012.  Findings: Lungs are clear.  Heart size is normal.  No pneumothorax or pleural effusion.  IMPRESSION: No acute disease.   Original Report Authenticated By: Holley Dexter, M.D.   1. Tension headache   2. Chest pain     MDM  Atypical chest pain.  Low risk for PE, ACS.  Tension headache with reproducible tenderness.  At this time there does not appear to be any evidence of an acute emergency medical condition and the patient appears stable for discharge with appropriate outpatient follow up.   Celene Kras, MD 11/28/12 [phone]

## 2012-11-28 NOTE — ED Notes (Signed)
Pt c/o Left shoulder pain and a headache x2-3 days. Pt denies sensitivity to light or N/V

## 2012-12-19 ENCOUNTER — Encounter (HOSPITAL_COMMUNITY): Payer: Self-pay | Admitting: Emergency Medicine

## 2012-12-19 ENCOUNTER — Observation Stay (HOSPITAL_COMMUNITY)
Admission: EM | Admit: 2012-12-19 | Discharge: 2012-12-21 | Disposition: A | Discharge status: Home / Self Care | Payer: Medicaid Other | Attending: Internal Medicine | Admitting: Internal Medicine

## 2012-12-19 ENCOUNTER — Observation Stay (HOSPITAL_COMMUNITY): Payer: Medicaid Other

## 2012-12-19 DIAGNOSIS — R0602 Shortness of breath: Secondary | ICD-10-CM | POA: Insufficient documentation

## 2012-12-19 DIAGNOSIS — Z7901 Long term (current) use of anticoagulants: Secondary | ICD-10-CM

## 2012-12-19 DIAGNOSIS — D649 Anemia, unspecified: Secondary | ICD-10-CM

## 2012-12-19 DIAGNOSIS — R209 Unspecified disturbances of skin sensation: Secondary | ICD-10-CM | POA: Insufficient documentation

## 2012-12-19 DIAGNOSIS — N920 Excessive and frequent menstruation with regular cycle: Secondary | ICD-10-CM | POA: Insufficient documentation

## 2012-12-19 DIAGNOSIS — F329 Major depressive disorder, single episode, unspecified: Secondary | ICD-10-CM

## 2012-12-19 DIAGNOSIS — N949 Unspecified condition associated with female genital organs and menstrual cycle: Secondary | ICD-10-CM | POA: Insufficient documentation

## 2012-12-19 DIAGNOSIS — R200 Anesthesia of skin: Secondary | ICD-10-CM

## 2012-12-19 DIAGNOSIS — N938 Other specified abnormal uterine and vaginal bleeding: Secondary | ICD-10-CM | POA: Insufficient documentation

## 2012-12-19 DIAGNOSIS — D500 Iron deficiency anemia secondary to blood loss (chronic): Principal | ICD-10-CM | POA: Insufficient documentation

## 2012-12-19 DIAGNOSIS — R760 Raised antibody titer: Secondary | ICD-10-CM | POA: Diagnosis present

## 2012-12-19 DIAGNOSIS — R079 Chest pain, unspecified: Secondary | ICD-10-CM | POA: Insufficient documentation

## 2012-12-19 DIAGNOSIS — D573 Sickle-cell trait: Secondary | ICD-10-CM | POA: Insufficient documentation

## 2012-12-19 HISTORY — DX: Iron deficiency anemia secondary to blood loss (chronic): D50.0

## 2012-12-19 HISTORY — DX: Anemia, unspecified: D64.9

## 2012-12-19 LAB — APTT: aPTT: 34 seconds (ref 24–37)

## 2012-12-19 LAB — COMPREHENSIVE METABOLIC PANEL
ALT: 20 U/L (ref 0–35)
Albumin: 3.6 g/dL (ref 3.5–5.2)
Alkaline Phosphatase: 71 U/L (ref 39–117)
BUN: 13 mg/dL (ref 6–23)
Chloride: 108 mEq/L (ref 96–112)
Potassium: 3.7 mEq/L (ref 3.5–5.1)
Sodium: 139 mEq/L (ref 135–145)
Total Bilirubin: 0.2 mg/dL — ABNORMAL LOW (ref 0.3–1.2)

## 2012-12-19 LAB — URINALYSIS, ROUTINE W REFLEX MICROSCOPIC
Bilirubin Urine: NEGATIVE
Glucose, UA: NEGATIVE mg/dL
Hgb urine dipstick: NEGATIVE
Ketones, ur: NEGATIVE mg/dL
Nitrite: NEGATIVE
Specific Gravity, Urine: 1.027 (ref 1.005–1.030)
pH: 6 (ref 5.0–8.0)

## 2012-12-19 LAB — CBC WITH DIFFERENTIAL/PLATELET
Basophils Absolute: 0 10*3/uL (ref 0.0–0.1)
Basophils Relative: 0 % (ref 0–1)
HCT: 22 % — ABNORMAL LOW (ref 36.0–46.0)
Hemoglobin: 6.3 g/dL — CL (ref 12.0–15.0)
Lymphocytes Relative: 36 % (ref 12–46)
Monocytes Relative: 6 % (ref 3–12)
Neutro Abs: 2.9 10*3/uL (ref 1.7–7.7)
Neutrophils Relative %: 57 % (ref 43–77)
RDW: 18.6 % — ABNORMAL HIGH (ref 11.5–15.5)
WBC: 5.1 10*3/uL (ref 4.0–10.5)

## 2012-12-19 LAB — PROTIME-INR: INR: 1.19 (ref 0.00–1.49)

## 2012-12-19 LAB — PRO B NATRIURETIC PEPTIDE: Pro B Natriuretic peptide (BNP): 104.7 pg/mL (ref 0–125)

## 2012-12-19 LAB — PREPARE RBC (CROSSMATCH)

## 2012-12-19 LAB — LIPASE, BLOOD: Lipase: 36 U/L (ref 11–59)

## 2012-12-19 MED ORDER — ONDANSETRON HCL 4 MG/2ML IJ SOLN: 4.0000 mg | Freq: Four times a day (QID) | INTRAMUSCULAR | Status: DC | PRN

## 2012-12-19 MED ORDER — SODIUM CHLORIDE 0.9 % IV SOLN
INTRAVENOUS | Status: DC
  Administered 2012-12-19: 21:00:00 via INTRAVENOUS

## 2012-12-19 MED ORDER — ONDANSETRON HCL 4 MG/2ML IJ SOLN: 4.0000 mg | Freq: Three times a day (TID) | INTRAMUSCULAR | Status: DC | PRN

## 2012-12-19 MED ORDER — SODIUM CHLORIDE 0.9 % IJ SOLN: 3.0000 mL | Freq: Two times a day (BID) | INTRAMUSCULAR | Status: DC

## 2012-12-19 MED ORDER — ACETAMINOPHEN 650 MG RE SUPP: 650.0000 mg | Freq: Four times a day (QID) | RECTAL | Status: DC | PRN

## 2012-12-19 MED ORDER — DIPHENHYDRAMINE HCL 50 MG/ML IJ SOLN
25.0000 mg | Freq: Once | INTRAMUSCULAR | Status: AC
  Administered 2012-12-19: 25 mg via INTRAVENOUS
  Filled 2012-12-19: qty 1

## 2012-12-19 MED ORDER — SODIUM CHLORIDE 0.9 % IV SOLN: INTRAVENOUS | Status: DC

## 2012-12-19 MED ORDER — ONDANSETRON HCL 4 MG PO TABS: 4.0000 mg | ORAL_TABLET | Freq: Four times a day (QID) | ORAL | Status: DC | PRN

## 2012-12-19 MED ORDER — ACETAMINOPHEN 325 MG PO TABS
650.0000 mg | ORAL_TABLET | Freq: Four times a day (QID) | ORAL | Status: DC | PRN
  Administered 2012-12-19 – 2012-12-21 (×2): 650 mg via ORAL
  Filled 2012-12-19 (×2): qty 2

## 2012-12-19 NOTE — H&P (Signed)
[facility] History and Physical  MELISS FLEEK JXB:[id number] DOB: Mar 05, 1976 DOA: 12/19/2012  Referring physician: ER physician. PCP: No PCP Per Patient   Chief Complaint: Chest pain.  HPI: Latoya Guzman is a 37 y.o. female with history of lupus anticoagulant off Coumadin since last 2 years presents with complaints of chest pain. Patient has been having chest pain off and on for last 3 weeks. Pain is present even at rest sometimes increased on exertion. Usually happens early in the morning retrosternal nonradiating. Denies any associated nausea vomiting or diaphoresis. Sometimes shortness of breath is associated. The pain usually lasts for about hour and a half and gets resolved. Today patient was taking her son to the pediatric clinic when around 3 PM patient started having chest pain and also at the same time left upper and lower extremity numbness. The whole episode lasted for around one and a half hour. Presently patient is asymptomatic. In the ER patient was found to be anemic. Patient has had a chronic anemia. Her hemoglobin usually runs around 7-8. Patient also states that she has menorrhagia her last menstrual cycle was last month presently she does not have any active vaginal bleed. Patient states she has sickle cell trait. Patient will be admitted for further management. Patient does not have any focal deficits on exam. EKG shows normal sinus rhythm with cardiac markers have been negative and chest x-ray is pending.  Review of Systems: As presented in the history of presenting illness, rest negative.  Past Medical History  Diagnosis Date  . Blood transfusion 1998    Post C/S 2units  . Depression     Was on Lexapro Stopped when pregnant  . Preterm labor     PTL last two pregnancies  . Fibroid 03/2010    Noted on [place] in Dec.  . 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 Tricuspid valve regurgitation 02/27/2010  .  Lupus Lupus Antigen     associated with pregnancy   . Sickle cell trait   . Anemia 12/19/2012   Past Surgical History  Procedure Laterality Date  . Dilation and curettage of uterus  2005, 2006    1st for twin loss, 2nd for retained placenta  . Cesarean section  1998, 2009  . Tubal ligation    . Cesarean section  11/14/2010    Procedure: CESAREAN SECTION;  Surgeon: Hollie Salk C. Marice Potter, MD;  Location: [facility];  Service: Gynecology;  Laterality: N/A;  Repeat cesarean section with delivery of baby boy at 1000. Apgars 9/9. Bilateral tubal ligation with filshie clips.    Social History:  reports that she has never smoked. She has never used smokeless tobacco. She reports that  drinks alcohol. She reports that she does not use illicit drugs. Home. where does patient live-- Can do ADLs. Can patient participate in ADLs?  No Known Allergies  Family History  Problem Relation Age of Onset  . Hypertension Mother   . Lupus Sister   . Diabetes Maternal Uncle   . Cancer Maternal Grandmother       Prior to Admission medications   Not on File   Physical Exam: Filed Vitals:   12/19/12 1815 12/19/12 1830 12/19/12 1900 12/19/12 1941  BP: 123/69 122/81 118/59 125/72  Pulse: 91 86 78 77  Temp:    97.9 F (36.6 C)  TempSrc:    Oral  Resp: 27 20 14 15   SpO2: 100% 100% 100%  General:  Well-developed well-nourished.  Eyes: Anicteric mild pallor.  ENT: No discharge from the ears eyes nose mouth.  Neck: No mass felt.  Cardiovascular: S1-S2 heard.  Respiratory: No rhonchi or crepitations.  Abdomen: Soft nontender bowel sounds present.  Skin: No rash.  Musculoskeletal: No edema.  Psychiatric: Appears normal.  Neurologic: Alert awake oriented to time place and person. Moves all extremities 5 x 5. No facial asymmetry. No tongue deviation.  Labs on Admission:  Basic Metabolic Panel:  Recent Labs Lab 12/19/12 1749  NA 139  K 3.7  CL 108  CO2 22  GLUCOSE 90  BUN 13  CREATININE  0.94  CALCIUM 8.7   Liver Function Tests:  Recent Labs Lab 12/19/12 1749  AST 23  ALT 20  ALKPHOS 71  BILITOT 0.2*  PROT 7.2  ALBUMIN 3.6    Recent Labs Lab 12/19/12 1749  LIPASE 36   No results found for this basename: AMMONIA,  in the last 168 hours CBC:  Recent Labs Lab 12/19/12 1749  WBC 5.1  NEUTROABS 2.9  HGB 6.3*  HCT 22.0*  MCV 54.5*  PLT 218   Cardiac Enzymes: No results found for this basename: CKTOTAL, CKMB, CKMBINDEX, TROPONINI,  in the last 168 hours  BNP (last 3 results)  Recent Labs  12/19/12 1749  PROBNP 104.7   CBG: No results found for this basename: GLUCAP,  in the last 168 hours  Radiological Exams on Admission: No results found.  EKG: Independently reviewed. Normal sinus rhythm.  Assessment/Plan Principal Problem:   Chest pain Active Problems:   Antiphospholipid antibody positive   Anemia   Left sided numbness   1. Chest pain - patient symptoms may most likely be secondary to anemia. At this time patient is chest pain-free. Patient is to receive 2 units of packed red blood cells which has been already ordered by the ER physician. Reassess in a.m. after transfusion to see if patient still has chest pain. Patient's d-dimer has been negative. 2. Severe anemia - patient's anemia panel done May of this year shows ferritin less than 10. Anemia most likely secondary to iron deficiency probably secondary to menorrhagia. Patient denies having any blood in her stools have used any NSAIDs. Check stool for occult blood. Patient will need further workup on anemia so patient. For now patient is receiving blood transfusion. 3. Left-sided upper and lower extremity numbness - patient is nonfocal and symptoms have resolved. This also may be from the anemia. Given the history of lupus anticoagulant disease I have requested neurologist Dr. Alene Mires opinion. 4. History of lupus anticoagulant disease.   Code Status: Full code.  Family  Communication: None.  Disposition Plan: Admit for observation.    Garreth Burnsworth N. [facility] Pager [phone].  If 7PM-7AM, please contact night-coverage [email or web address] Password [facility] 12/19/2012, 7:56 PM

## 2012-12-19 NOTE — ED Notes (Signed)
Pt here with epigastric pain and leg swelling; pt with abd swelling; pt sts tubes tied but sts increasing abd girth; pt sts bruising to legs at time; pt sts hx of liver problems during pregnancy and being on coumadin for lupus antibody; pt sts not currently taking and has not been to doctor since not having medicaid

## 2012-12-19 NOTE — ED Provider Notes (Signed)
CSN: [id number]     Arrival date & time 12/19/12  1718 History   First MD Initiated Contact with Patient 12/19/12 1750     Chief Complaint  Patient presents with  . Abdominal Pain  . Leg Swelling   (Consider location/radiation/quality/duration/timing/severity/associated sxs/prior Treatment) HPI Comments: Patient presents with left-sided chest pain it radiates underneath her left breast that has been ongoing for the past hour period she had this pain in the past and told that she had a leaking valve. She has a history of lupus anticoagulant, sickle cell trait, anemia. The pain is worse with inspiration worse with palpation. She denies any shortness of breath, nausea vomiting. She has abdominal pain yesterday but none today. No fevers, chills, dysuria hematuria. No increase in her chronic leg swelling. He is not have a Dr. She believes she is supposed to be on anticoagulation but is not been on for the past 2 years. She also complains of intermittent paresthesias in the left side of her body and then coming going for the past 4 months. She denies any focal weakness.  The history is provided by the patient.    Past Medical History  Diagnosis Date  . Blood transfusion 1998    Post C/S 2units  . Depression     Was on Lexapro Stopped when pregnant  . Preterm labor     PTL last two pregnancies  . Fibroid 03/2010    Noted on [place] in Dec.  . 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 Tricuspid valve regurgitation 02/27/2010  . Lupus Lupus Antigen     associated with pregnancy   . Sickle cell trait   . Anemia 12/19/2012   Past Surgical History  Procedure Laterality Date  . Dilation and curettage of uterus  2005, 2006    1st for twin loss, 2nd for retained placenta  . Cesarean section  1998, 2009  . Tubal ligation    . Cesarean section  11/14/2010    Procedure: CESAREAN SECTION;  Surgeon: Hollie Salk C. Marice Potter, MD;  Location: [facility];  Service:  Gynecology;  Laterality: N/A;  Repeat cesarean section with delivery of baby boy at 1000. Apgars 9/9. Bilateral tubal ligation with filshie clips.    Family History  Problem Relation Age of Onset  . Hypertension Mother   . Lupus Sister   . Diabetes Maternal Uncle   . Cancer Maternal Grandmother    History  Substance Use Topics  . Smoking status: Never Smoker   . Smokeless tobacco: Never Used  . Alcohol Use: Yes     Comment: occ   OB History   Grav Para Term Preterm Abortions TAB SAB Ect Mult Living   10 8 6 2 2  2  1 7      Review of Systems  Constitutional: Negative for fever, activity change and appetite change.  HENT: Negative for congestion and rhinorrhea.   Eyes: Negative for photophobia.  Respiratory: Positive for chest tightness and shortness of breath. Negative for cough.   Cardiovascular: Positive for chest pain.  Gastrointestinal: Positive for abdominal pain. Negative for nausea and vomiting.  Genitourinary: Negative for dysuria, hematuria, vaginal bleeding and vaginal discharge.  Musculoskeletal: Negative for back pain.  Neurological: Positive for numbness. Negative for dizziness, weakness and headaches.  A complete 10 system review of systems was obtained and all systems are negative except as noted in the HPI and PMH.    Allergies  Review of  patient's allergies indicates no known allergies.  Home Medications   No current outpatient prescriptions on file. BP 116/68  Pulse 76  Temp(Src) 97.5 F (36.4 C) (Oral)  Resp 16  Ht 5\' 11"  (1.803 m)  Wt 223 lb 1.6 oz (101.197 kg)  BMI 31.13 kg/m2  SpO2 100%  LMP 12/19/2012 Physical Exam  Constitutional: She is oriented to person, place, and time. She appears well-developed and well-nourished. No distress.  HENT:  Head: Normocephalic and atraumatic.  Mouth/Throat: Oropharynx is clear and moist. No oropharyngeal exudate.  Eyes: Conjunctivae and EOM are normal. Pupils are equal, round, and reactive to light.   Neck: Normal range of motion. Neck supple.  Cardiovascular: Normal rate, regular rhythm and normal heart sounds.   No murmur heard. Pulmonary/Chest: Effort normal and breath sounds normal. No respiratory distress. She exhibits no tenderness.  Abdominal: Soft. There is no tenderness. There is no rebound and no guarding.  Musculoskeletal: Normal range of motion. She exhibits no edema and no tenderness.  Neurological: She is alert and oriented to person, place, and time. No cranial nerve deficit. She exhibits normal muscle tone. Coordination normal.  CN 2-12 intact, no ataxia on finger to nose, no nystagmus, 5/5 strength throughout, no pronator drift, Romberg negative, normal gait.   Skin: Skin is warm.    ED Course  Procedures (including critical care time) Labs Review Labs Reviewed  CBC WITH DIFFERENTIAL - Abnormal; Notable for the following:    Hemoglobin 6.3 (*)    HCT 22.0 (*)    MCV 54.5 (*)    MCH 15.6 (*)    MCHC 28.6 (*)    RDW 18.6 (*)    All other components within normal limits  COMPREHENSIVE METABOLIC PANEL - Abnormal; Notable for the following:    Total Bilirubin 0.2 (*)    GFR calc non Af Amer 78 (*)    All other components within normal limits  URINALYSIS, ROUTINE W REFLEX MICROSCOPIC - Abnormal; Notable for the following:    APPearance CLOUDY (*)    All other components within normal limits  LIPASE, BLOOD  PROTIME-INR  APTT  D-DIMER, QUANTITATIVE  PRO B NATRIURETIC PEPTIDE  BASIC METABOLIC PANEL  CBC  POCT PREGNANCY, URINE  TYPE AND SCREEN  PREPARE RBC (CROSSMATCH)  ABO/RH   Imaging Review Dg Chest 2 View  12/19/2012   *RADIOLOGY REPORT*  Clinical Data: Abdominal pain and swelling  CHEST - 2 VIEW  Comparison: 11/28/2012  Findings: Cardiomediastinal silhouette is stable.  No acute infiltrate or pleural effusion.  No pulmonary edema.  Bony thorax is stable.  IMPRESSION: No active disease.  No significant change.   Original Report Authenticated By: Natasha Mead,  M.D.    MDM   1. Anemia   2. Chest pain   3. Left sided numbness     one hour history of left-sided chest pain is worse with deep breathing. History of intermittent similar pain in the past. History of lupus anticoagulant, sickle cell trait, chronic anemia. Associated with lightheadedness.  Hemoglobin 6.3. Patient has history of anemia has been a 7 and 8 range in the past no recent transfusions. She states compliance with her iron.  D-dimer negative. Troponin negative. EKG nonischemic. This chest pain may be attributed to anemia. Patient states compliance with her iron. She endorses history of heavy menstrual periods no vaginal bleeding now. She is agreeable to admission for blood transfusion and further evaluation of anemia.    Date: 12/19/2012  Rate: 81  Rhythm: normal sinus  rhythm  QRS Axis: normal  Intervals: normal  ST/T Wave abnormalities: normal  Conduction Disutrbances:none  Narrative Interpretation:   Old EKG Reviewed: none available    Glynn Octave, MD 12/20/12 0013

## 2012-12-19 NOTE — ED Notes (Addendum)
For the past few months, pt has had problems feeling numb on left side. Today, pt states felt left upper chest hurting and SOB, felt pulling/tightening pain in chest. Whole left side was numb from head to toe. Shooting pain in neck. Burning, shooting, cramping pain in the right middle abdomen. Denies nausea/vomiting.

## 2012-12-20 ENCOUNTER — Observation Stay (HOSPITAL_COMMUNITY): Payer: Medicaid Other

## 2012-12-20 DIAGNOSIS — F329 Major depressive disorder, single episode, unspecified: Secondary | ICD-10-CM

## 2012-12-20 DIAGNOSIS — R51 Headache: Secondary | ICD-10-CM

## 2012-12-20 DIAGNOSIS — R894 Abnormal immunological findings in specimens from other organs, systems and tissues: Secondary | ICD-10-CM

## 2012-12-20 LAB — BASIC METABOLIC PANEL
BUN: 12 mg/dL (ref 6–23)
CO2: 21 mEq/L (ref 19–32)
GFR calc non Af Amer: 82 mL/min — ABNORMAL LOW (ref >90)
Glucose, Bld: 98 mg/dL (ref 70–99)
Potassium: 4 mEq/L (ref 3.5–5.1)

## 2012-12-20 LAB — IRON AND TIBC
Iron: 64 ug/dL (ref 42–135)
Saturation Ratios: 15 % — ABNORMAL LOW (ref 20–55)
TIBC: 428 ug/dL (ref 250–470)
UIBC: 364 ug/dL (ref 125–400)

## 2012-12-20 LAB — TYPE AND SCREEN: Unit division: 0

## 2012-12-20 LAB — RETICULOCYTES: Retic Ct Pct: 0.5 % (ref 0.4–3.1)

## 2012-12-20 LAB — CBC
HCT: 27.3 % — ABNORMAL LOW (ref 36.0–46.0)
Hemoglobin: 8.3 g/dL — ABNORMAL LOW (ref 12.0–15.0)
MCH: 18.3 pg — ABNORMAL LOW (ref 26.0–34.0)
MCV: 60.3 fL — ABNORMAL LOW (ref 78.0–100.0)
Platelets: 203 10*3/uL (ref 150–400)
RBC: 4.53 MIL/uL (ref 3.87–5.11)
WBC: 4.8 10*3/uL (ref 4.0–10.5)

## 2012-12-20 LAB — FERRITIN: Ferritin: 5 ng/mL — ABNORMAL LOW (ref 10–291)

## 2012-12-20 MED ORDER — PROCHLORPERAZINE EDISYLATE 5 MG/ML IJ SOLN
10.0000 mg | Freq: Once | INTRAMUSCULAR | Status: AC
  Administered 2012-12-20: 10 mg via INTRAVENOUS
  Filled 2012-12-20: qty 2

## 2012-12-20 MED ORDER — SODIUM CHLORIDE 0.9 % IV SOLN
125.0000 mg | Freq: Once | INTRAVENOUS | Status: AC
  Administered 2012-12-20: 125 mg via INTRAVENOUS
  Filled 2012-12-20: qty 10

## 2012-12-20 MED ORDER — PROCHLORPERAZINE EDISYLATE 5 MG/ML IJ SOLN
10.0000 mg | Freq: Four times a day (QID) | INTRAMUSCULAR | Status: DC | PRN
  Filled 2012-12-20: qty 2

## 2012-12-20 MED ORDER — LOPERAMIDE HCL 2 MG PO CAPS
2.0000 mg | ORAL_CAPSULE | Freq: Three times a day (TID) | ORAL | Status: DC | PRN
  Administered 2012-12-20: 2 mg via ORAL
  Filled 2012-12-20: qty 1

## 2012-12-20 NOTE — Procedures (Signed)
ELECTROENCEPHALOGRAM REPORT   Patient: Latoya Guzman       Room #: 1O10 EEG No. ID: [id number] Age: 37 y.o.        Sex: female Referring Physician: Toniann Fail Report Date:  12/20/2012        Interpreting Physician: Thana Farr D  History: Latoya Guzman is an 37 y.o. female with recurrent episodes of left sided numbness and headache  Medications:  Scheduled: . ferric gluconate (FERRLECIT/NULECIT) IV  125 mg Intravenous Once  . sodium chloride  3 mL Intravenous Q12H    Conditions of Recording:  This is a 16 channel EEG carried out with the patient in the awake, drowsy and asleep states  Description:  The waking background activity consists of a low voltage, symmetrical, fairly well organized, 10z alpha activity, seen from the parieto-occipital and posterior temporal regions.  Low voltage fast activity, poorly organized, is seen anteriorly and is at times superimposed on more posterior regions.  A mixture of theta and alpha rhythms are seen from the central and temporal regions. The patient drowses with slowing to irregular, low voltage theta and beta activity.   The patient goes in to a light sleep with symmetrical sleep spindles, vertex central sharp transients and irregular slow activity.  Hyperventilation was not performed.   Intermittent photic stimulation was performed but failed to illicit any change in the tracing.    IMPRESSION: Normal electroencephalogram, awake, asleep and with activation procedures. There are no focal lateralizing or epileptiform features.   Thana Farr, MD [facility] [phone] 12/20/2012, 6:50 PM

## 2012-12-20 NOTE — Progress Notes (Signed)
Patient ID: Latoya Guzman  female  ZOX:[id number]    DOB: 10-Dec-1975    DOA: 12/19/2012  PCP: No PCP Per Patient  Assessment/Plan: Principal Problem:   Chest pain: -Resolved after blood transfusion, hemoglobin 8.3 today, initial d-dimer was negative   Active Problems:  History of  Antiphospholipid antibody positive, left-sided numbness - EEG, MRI brain today    Anemia: Patient complains of menorrhagia 7-8 days, history of pica - Check iron panel, anemia panel, hemoglobin stable after 2 units of packed RBCs - [facility], had trans-vaginal pain and pelvic ultrasound done in 2013 which had shown normal endometrial thickness, fibroid in the lower uterine segment about 2 cm, adenomyosis - FOBT negative    DVT Prophylaxis:  Code Status:  Disposition:Hopefully tomorrow    Subjective: Denies any specific complaints, feels chest pain has resolved, started menstrual period yesterday  Objective: Weight change:   Intake/Output Summary (Last 24 hours) at 12/20/12 1510 Last data filed at 12/20/12 1000  Gross per 24 hour  Intake   1390 ml  Output    200 ml  Net   1190 ml   Blood pressure 117/66, pulse 68, temperature 98 F (36.7 C), temperature source Oral, resp. rate 18, height 5\' 11"  (1.803 m), weight 101.197 kg (223 lb 1.6 oz), last menstrual period 12/19/2012, SpO2 100.00%.  Physical Exam: General: Alert and awake, oriented x3, not in any acute distress. CVS: S1-S2 clear, no murmur rubs or gallops Chest: clear to auscultation bilaterally, no wheezing, rales or rhonchi Abdomen: soft nontender, nondistended, normal bowel sounds  Extremities: no cyanosis, clubbing or edema noted bilaterally Neuro: Cranial nerves II-XII intact, no focal neurological deficits  Lab Results: Basic Metabolic Panel:  Recent Labs Lab 12/19/12 1749 12/20/12 0400  NA 139 137  K 3.7 4.0  CL 108 108  CO2 22 21  GLUCOSE 90 98  BUN 13 12  CREATININE 0.94 0.90  CALCIUM  8.7 8.5   Liver Function Tests:  Recent Labs Lab 12/19/12 1749  AST 23  ALT 20  ALKPHOS 71  BILITOT 0.2*  PROT 7.2  ALBUMIN 3.6    Recent Labs Lab 12/19/12 1749  LIPASE 36   No results found for this basename: AMMONIA,  in the last 168 hours CBC:  Recent Labs Lab 12/19/12 1749 12/20/12 1100  WBC 5.1 4.8  NEUTROABS 2.9  --   HGB 6.3* 8.3*  HCT 22.0* 27.3*  MCV 54.5* 60.3*  PLT 218 203   Cardiac Enzymes: No results found for this basename: CKTOTAL, CKMB, CKMBINDEX, TROPONINI,  in the last 168 hours BNP: No components found with this basename: POCBNP,  CBG: No results found for this basename: GLUCAP,  in the last 168 hours   Micro Results: No results found for this or any previous visit (from the past 240 hour(s)).  Studies/Results: Dg Chest 2 View  12/19/2012   *RADIOLOGY REPORT*  Clinical Data: Abdominal pain and swelling  CHEST - 2 VIEW  Comparison: 11/28/2012  Findings: Cardiomediastinal silhouette is stable.  No acute infiltrate or pleural effusion.  No pulmonary edema.  Bony thorax is stable.  IMPRESSION: No active disease.  No significant change.   Original Report Authenticated By: Natasha Mead, M.D.   Dg Chest 2 View  11/28/2012   *RADIOLOGY REPORT*  Clinical Data: Chest and left shoulder pain.  Sickle cell disease.  CHEST - 2 VIEW  Comparison: PA and lateral chest 05/08/2012.  Findings: Lungs are clear.  Heart size is normal.  No pneumothorax or pleural effusion.  IMPRESSION: No acute disease.   Original Report Authenticated By: Holley Dexter, M.D.    Medications: Scheduled Meds: . sodium chloride  3 mL Intravenous Q12H      LOS: 1 day   Lauree Yurick M.D. [facility] 12/20/2012, 3:10 PM Pager: [phone]  If 7PM-7AM, please contact night-coverage [email or web address] Password [email or web address]

## 2012-12-20 NOTE — Progress Notes (Signed)
Utilization review completed.  

## 2012-12-20 NOTE — Consult Note (Addendum)
Neurology Consultation Reason for Consult: Left sided numbness Referring Physician: Toniann Fail, A  CC: Chest pain  History is obtained from:patient  HPI: Latoya Guzman is a 37 y.o. 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 She was started on anticoagulation and maintained on anticoagulation for quite some time, but has not been on it since 2012. Over the past 2 years, at least since 2010 since that is the first, C1 documented, she has had recurrent episodes of shooting pain in her left side followed by numbness and paresthesias. This is often accompanied by a severe headache.  Early she describes of her typical spells, consisting of chest pain as well as a shooting pain from her neck down into her arm, and paresthesias of her left arm and leg, no involvement of her face. She states that she has tingling involving the leg.  While working up her chest pain, she was found to be anemic and is therefore being transfused for symptomatic anemia.  She also describes 4 episodes of the past few weeks of her left arm twisting and remaining rigid for up to a half hour. Is also accompanied by lancinating pains shooting down into her arm.  ROS: A 14 point ROS was performed and is negative except as noted in the HPI.  Past Medical History  Diagnosis Date  . Blood transfusion 1998    Post C/S 2units  . Depression     Was on Lexapro Stopped when pregnant  . Preterm labor     PTL last two pregnancies  . Fibroid 03/2010    Noted on [place] in Dec.  . 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 Tricuspid valve regurgitation 02/27/2010  . Lupus Lupus Antigen     associated with pregnancy   . Sickle cell trait   . Anemia 12/19/2012    Family History: No history of migraine Grandmother-seizures  Social History: Tob: Denies  Exam: Current vital  signs: BP 117/68  Pulse 67  Temp(Src) 97.7 F (36.5 C) (Oral)  Resp 16  Ht 5\' 11"  (1.803 m)  Wt 101.197 kg (223 lb 1.6 oz)  BMI 31.13 kg/m2  SpO2 100%  LMP 12/19/2012 Vital signs in last 24 hours: Temp:  [97.5 F (36.4 C)-98.4 F (36.9 C)] 97.7 F (36.5 C) (08/27 0043) Pulse Rate:  [66-91] 67 (08/27 0043) Resp:  [14-27] 16 (08/27 0043) BP: (109-136)/(53-81) 117/68 mmHg (08/27 0043) SpO2:  [99 %-100 %] 100 % (08/27 0043) Weight:  [101.197 kg (223 lb 1.6 oz)] 101.197 kg (223 lb 1.6 oz) (08/26 2100)  General: In bed, NAD CV: Regular rate and rhythm Mental Status: Patient is awake, alert, oriented to person, place, month, year, and situation. Immediate and remote memory are intact. Patient is able to give a clear and coherent history. No signs of aphasia or neglect Cranial Nerves: II: Visual Fields are full. Pupils are equal, round, and reactive to light.  Discs are difficult to visualize. III,IV, VI: EOMI without ptosis or diploplia.  V: Facial sensation is symmetric to temperature VII: Facial movement is symmetric.  VIII: hearing is intact to voice X: Uvula elevates symmetrically XI: Shoulder shrug is symmetric. XII: tongue is midline without atrophy or fasciculations.  Motor: Tone is normal. Bulk is normal. 5/5 strength was present in all four extremities.  Sensory: Sensation is normal on the right, she reports tingly sensation on  the left when touched. Deep Tendon Reflexes: 2+ and symmetric in the biceps and patellae.  Cerebellar: FNF  intact bilaterally Gait: Not tested as the patient is currently being transfused.  I have reviewed labs in epic and the results pertinent to this consultation are: ANA-negative in 2012  Impression: 37 year old female with recurrent episodes of left-sided dysesthesia in association with headache. My suspicion is very low that this has any relationship to cerebrovascular disease. Her current episodes of left arm tightening and  lancinating pains are unclear to me, but an EEG and MRI given her history may be reasonable. With headaches with multiple of the events, complicated migraine is also a possibility  Recommendations: 1) MRI brain 2) EEG 3) I encouraged the patient to followup with a rheumatologist 4) compazine 10mg  for headache  Ritta Slot, MD [facility] [phone]  If 7pm- 7am, please page neurology on call at [phone].

## 2012-12-20 NOTE — Progress Notes (Signed)
Routine adult EEG completed. 

## 2012-12-21 DIAGNOSIS — R7989 Other specified abnormal findings of blood chemistry: Secondary | ICD-10-CM

## 2012-12-21 LAB — CBC
Hemoglobin: 7.5 g/dL — ABNORMAL LOW (ref 12.0–15.0)
MCH: 18.2 pg — ABNORMAL LOW (ref 26.0–34.0)
MCV: 59.7 fL — ABNORMAL LOW (ref 78.0–100.0)
RBC: 4.12 MIL/uL (ref 3.87–5.11)

## 2012-12-21 MED ORDER — FERROUS GLUCONATE 325 MG PO TABS: 325.0000 mg | ORAL_TABLET | Freq: Three times a day (TID) | ORAL | Status: DC

## 2012-12-21 MED ORDER — PROMETHAZINE HCL 12.5 MG PO TABS: 12.5000 mg | ORAL_TABLET | Freq: Four times a day (QID) | ORAL | Status: DC | PRN

## 2012-12-21 MED ORDER — SENNA-DOCUSATE SODIUM 8.6-50 MG PO TABS: 1.0000 | ORAL_TABLET | Freq: Every day | ORAL | Status: DC | PRN

## 2012-12-21 NOTE — Discharge Summary (Signed)
Physician Discharge Summary  Patient ID: Latoya Guzman MRN: [id number] DOB/AGE: [birth year] 37 y.o.  Admit date: 12/19/2012 Discharge date: 12/21/2012  Primary Care Physician:  No PCP Per Patient  Discharge Diagnoses:    . Antiphospholipid antibody positive . Chest pain resolved after blood transfusion  . Anemia secondary to menorrhagia and dysfunctional uterine bleeding . Left sided numbness resolved Iron deficiency anemia  Consults:  Neurology   Recommendations for Outpatient Follow-up:  #1. Patient was recommended to followup with outpatient rheumatology #2. She was recommended to followup with home health center regarding menorrhagia   Allergies:  No Known Allergies   Discharge Medications:   Medication List         ferrous gluconate 325 MG tablet  Commonly known as:  FERGON  Take 1 tablet (325 mg total) by mouth 3 (three) times daily with meals.     promethazine 12.5 MG tablet  Commonly known as:  PHENERGAN  Take 1 tablet (12.5 mg total) by mouth every 6 (six) hours as needed for nausea.     sennosides-docusate sodium 8.6-50 MG tablet  Commonly known as:  SENOKOT-S  Take 1 tablet by mouth daily as needed for constipation.         Brief H and P: For complete details please refer to admission H and P, but in brief Latoya Guzman is a 37 y.o. female with history of lupus anticoagulant off Coumadin since last 2 years presented with complaints of chest pain. Patient was having chest pains off and on for last 3 weeks. Pain is present even at rest sometimes increased on exertion. Usually happens early in the morning retrosternal nonradiating. Denied any associated nausea vomiting or diaphoresis. Sometimes shortness of breath is associated. The pain usually lasted for about hour and a half and gets resolved. On the day of admission, patient was taking her son to the pediatric clinic when around 3 PM patient started having chest pain and also at the same time left  upper and lower extremity numbness. The whole episode lasted for around one and a half hour. In the ER patient was found to be anemic. Patient has had a chronic anemia. Her hemoglobin usually runs around 7-8. Patient also states that she has menorrhagia her last menstrual cycle was last month presently she does not have any active vaginal bleed. Patient states she has sickle cell trait. Patient was admitted for further workup     Hospital Course:  Chest pain: Likely due to severe anemia, hemoglobin was 6.3 at the time of admission. Point of care troponin was negative. Initial d-dimer was negative. Chest pain resolved after blood transfusion.  History of Antiphospholipid antibody positive, left-sided numbness . Neurology was consulted who recommended EEG which was normal. Patient underwent MRI of the brain which was essentially normal.  Anemia: Patient complained of menorrhagia 7-8 days, history of pica. Patient was given 2 units of packed RBCs. Anemia panel and showed iron level of 64, ferritin 5. Folate 13.1 B12 689. Patient was also given one dose of IV venofer and started on oral iron replacement. She was recommended outpatient followup with [facility]. She had transvaginal and pelvic ultrasound done in 2013 which had shown normal endometrial thickness, fibroid in the lower uterine segment about 2 cm, adenomyosis.  FOBT was negative.   History of lupus anticoagulant: Patient was recommended to followup with rheumatology outpatient.  Day of Discharge BP 117/69  Pulse 71  Temp(Src) 98.3 F (36.8 C) (Oral)  Resp 18  Ht  5\' 11"  (1.803 m)  Wt 101.197 kg (223 lb 1.6 oz)  BMI 31.13 kg/m2  SpO2 100%  LMP 12/19/2012  Physical Exam: General: Alert and awake oriented x3 not in any acute distress. HEENT: anicteric sclera, pupils reactive to light and accommodation CVS: S1-S2 clear no murmur rubs or gallops Chest: clear to auscultation bilaterally, no wheezing rales or rhonchi Abdomen: soft  nontender, nondistended, normal bowel sounds, no organomegaly Extremities: no cyanosis, clubbing or edema noted bilaterally Neuro: Cranial nerves II-XII intact, no focal neurological deficits   The results of significant diagnostics from this hospitalization (including imaging, microbiology, ancillary and laboratory) are listed below for reference.    LAB RESULTS: Basic Metabolic Panel:  Recent Labs Lab 12/19/12 1749 12/20/12 0400  NA 139 137  K 3.7 4.0  CL 108 108  CO2 22 21  GLUCOSE 90 98  BUN 13 12  CREATININE 0.94 0.90  CALCIUM 8.7 8.5   Liver Function Tests:  Recent Labs Lab 12/19/12 1749  AST 23  ALT 20  ALKPHOS 71  BILITOT 0.2*  PROT 7.2  ALBUMIN 3.6    Recent Labs Lab 12/19/12 1749  LIPASE 36   No results found for this basename: AMMONIA,  in the last 168 hours CBC:  Recent Labs Lab 12/19/12 1749 12/20/12 1100 12/21/12 0415  WBC 5.1 4.8 5.6  NEUTROABS 2.9  --   --   HGB 6.3* 8.3* 7.5*  HCT 22.0* 27.3* 24.6*  MCV 54.5* 60.3* 59.7*  PLT 218 203 204   Cardiac Enzymes: No results found for this basename: CKTOTAL, CKMB, CKMBINDEX, TROPONINI,  in the last 168 hours BNP: No components found with this basename: POCBNP,  CBG: No results found for this basename: GLUCAP,  in the last 168 hours  Significant Diagnostic Studies:  Dg Chest 2 View  12/19/2012   *RADIOLOGY REPORT*  Clinical Data: Abdominal pain and swelling  CHEST - 2 VIEW  Comparison: 11/28/2012  Findings: Cardiomediastinal silhouette is stable.  No acute infiltrate or pleural effusion.  No pulmonary edema.  Bony thorax is stable.  IMPRESSION: No active disease.  No significant change.   Original Report Authenticated By: Natasha Mead, M.D.   Mr Brain Wo Contrast  12/20/2012   *RADIOLOGY REPORT*  Clinical Data: ?  Seizure  MRI HEAD WITHOUT CONTRAST  Technique:  Multiplanar, multiecho pulse sequences of the brain and surrounding structures were obtained according to standard protocol without  intravenous contrast.  Comparison: CT head 01/02/2006  Findings: Ventricle size is normal.  Negative for Chiari malformation.  Pituitary gland is normal in size.  Negative for acute or chronic infarction.  Cerebral white matter is normal.  Brainstem and cerebellum are normal.  Negative for hemorrhage or mass lesion.  High-resolution imaging of the temporal lobes appear normal bilaterally.  Mild mucosal edema in the paranasal sinuses.  IMPRESSION: Normal MRI of the brain.   Original Report Authenticated By: Janeece Riggers, M.D.     Disposition and Follow-up:     Discharge Orders   Future Orders Complete By Expires   Diet general  As directed    Increase activity slowly  As directed        DISPOSITION: Home  DIET: Regular diet  ACTIVITY: As tolerated    DISCHARGE FOLLOW-UP Follow-up Information   Follow up with [facility]. Schedule an appointment as soon as possible for a visit in 2 weeks. (for anemia, dysfunctional uterine bleeding)    Contact information:   [address] [place]  [address] [phone]      Follow up with Azzie Roup, MD. Schedule an appointment as soon as possible for a visit in 2 weeks. (for rheumatology)    Specialty:  Rheumatology   Contact information:   [address] [place] [address] [phone]       Time spent on Discharge: 35 mins  Signed:   RAI,RIPUDEEP M.D. [facility] 12/21/2012, 6:15 PM Pager: [phone]

## 2012-12-21 NOTE — Progress Notes (Signed)
Pt is having heavy menstrual bleeding. She is asymptomatic and vitals signs are stable. Attending physician notified. No new orders given at this time.

## 2012-12-21 NOTE — Progress Notes (Signed)
CSW informed pt does not have transportation home and is ready to dc. CSW spoke with pt and provided with a bus pass. Pt nurse is aware. CSW signing off.   Leylany Nored, LCSWA [phone]

## 2012-12-21 NOTE — Care Management Note (Signed)
Case Manager did speak to pt and she has concerns with her PCP. Pt has medicaid and will need to f/u with her SW to be reassigned to different PCP office. Pt is aware. No further needs from CM at this time. Gala Lewandowsky , RN,BSN [phone]

## 2012-12-22 ENCOUNTER — Ambulatory Visit (HOSPITAL_COMMUNITY)
Admit: 2012-12-22 | Discharge: 2012-12-22 | Disposition: A | Discharge status: Home / Self Care | Payer: Medicaid Other | Attending: Emergency Medicine | Admitting: Emergency Medicine

## 2012-12-22 ENCOUNTER — Other Ambulatory Visit (HOSPITAL_COMMUNITY): Payer: Self-pay | Admitting: Emergency Medicine

## 2012-12-22 ENCOUNTER — Encounter (HOSPITAL_COMMUNITY): Payer: Self-pay | Admitting: *Deleted

## 2012-12-22 ENCOUNTER — Emergency Department (INDEPENDENT_AMBULATORY_CARE_PROVIDER_SITE_OTHER)
Admission: EM | Admit: 2012-12-22 | Discharge: 2012-12-22 | Disposition: A | Discharge status: Home / Self Care | Payer: Medicaid Other | Source: Home / Self Care

## 2012-12-22 DIAGNOSIS — M7989 Other specified soft tissue disorders: Secondary | ICD-10-CM

## 2012-12-22 DIAGNOSIS — M79609 Pain in unspecified limb: Secondary | ICD-10-CM

## 2012-12-22 DIAGNOSIS — R609 Edema, unspecified: Secondary | ICD-10-CM

## 2012-12-22 DIAGNOSIS — M25561 Pain in right knee: Secondary | ICD-10-CM

## 2012-12-22 DIAGNOSIS — M329 Systemic lupus erythematosus, unspecified: Secondary | ICD-10-CM | POA: Insufficient documentation

## 2012-12-22 DIAGNOSIS — R600 Localized edema: Secondary | ICD-10-CM

## 2012-12-22 DIAGNOSIS — D571 Sickle-cell disease without crisis: Secondary | ICD-10-CM | POA: Insufficient documentation

## 2012-12-22 DIAGNOSIS — M79604 Pain in right leg: Secondary | ICD-10-CM

## 2012-12-22 NOTE — ED Provider Notes (Signed)
CSN: [id number]     Arrival date & time 12/22/12  1404 History   First MD Initiated Contact with Patient 12/22/12 1433     Chief Complaint  Patient presents with  . Leg Swelling   (Consider location/radiation/quality/duration/timing/severity/associated sxs/prior Treatment) HPI Comments: 37 year old female with a history of sickle cell trait, lupus, anemia, iron deficiency, mitral valve regurgitation, tricuspid valve regurgitation, and recent transfusion of packed red blood cells for anemia in  yesterday. She also had an MRI and EKG which were within normal limits and there were no abnormal findings.  She states that early this afternoon while walking she experienced a sudden, acute pain in the right anteromedial aspect of her lower leg. A few minutes later she noticed swelling in the same location. She called a nurse who told her she needed to come to the urgent care promptly. She is not complaining of chest pain or shortness of breath.    Past Medical History  Diagnosis Date  . Blood transfusion 1998    Post C/S 2units  . Depression     Was on Lexapro Stopped when pregnant  . Preterm labor     PTL last two pregnancies  . Fibroid 03/2010    Noted on [place] in Dec.  . 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 Tricuspid valve regurgitation 02/27/2010  . Lupus Lupus Antigen     associated with pregnancy   . Sickle cell trait   . Anemia 12/19/2012   Past Surgical History  Procedure Laterality Date  . Dilation and curettage of uterus  2005, 2006    1st for twin loss, 2nd for retained placenta  . Cesarean section  1998, 2009  . Tubal ligation    . Cesarean section  11/14/2010    Procedure: CESAREAN SECTION;  Surgeon: Hollie Salk C. Marice Potter, MD;  Location: [facility];  Service: Gynecology;  Laterality: N/A;  Repeat cesarean section with delivery of baby boy at 1000. Apgars 9/9. Bilateral tubal ligation with filshie clips.    Family  History  Problem Relation Age of Onset  . Hypertension Mother   . Lupus Sister   . Diabetes Maternal Uncle   . Cancer Maternal Grandmother    History  Substance Use Topics  . Smoking status: Never Smoker   . Smokeless tobacco: Never Used  . Alcohol Use: Yes     Comment: occ   OB History   Grav Para Term Preterm Abortions TAB SAB Ect Mult Living   10 8 6 2 2  2  1 7      Review of Systems  Constitutional: Negative for fever, chills and diaphoresis.  HENT: Negative.   Respiratory: Negative.   Cardiovascular: Negative.   Gastrointestinal: Negative.   Musculoskeletal:       Mild leg edema as above.  Skin: Negative.   Neurological: Negative for dizziness, tremors, syncope and facial asymmetry.    Allergies  Review of patient's allergies indicates no known allergies.  Home Medications   Current Outpatient Rx  Name  Route  Sig  Dispense  Refill  . ferrous gluconate (FERGON) 325 MG tablet   Oral   Take 1 tablet (325 mg total) by mouth 3 (three) times daily with meals.   90 tablet   5   . promethazine (PHENERGAN) 12.5 MG tablet   Oral   Take 1 tablet (12.5 mg total) by mouth every 6 (six) hours as needed for nausea.  30 tablet   0   . sennosides-docusate sodium (SENOKOT-S) 8.6-50 MG tablet   Oral   Take 1 tablet by mouth daily as needed for constipation.   30 tablet   5    BP 108/60  Pulse 82  Temp(Src) 98.3 F (36.8 C) (Oral)  Resp 18  SpO2 100%  LMP 12/22/2012 Physical Exam  Nursing note and vitals reviewed. Constitutional: She is oriented to person, place, and time. She appears well-developed and well-nourished. No distress.  Eyes: EOM are normal.  Neck: Normal range of motion. Neck supple.  Cardiovascular: Normal rate, regular rhythm and normal heart sounds.   Pulmonary/Chest: Effort normal and breath sounds normal. No respiratory distress. She has no wheezes. She has no rales.  Musculoskeletal: She exhibits edema and tenderness.  There is a  demarcation to the right anterior and medial lower leg, similar to that of a sock imprint, at and above this point there is mild swelling of the leg. It extends toward the calf. There is no discoloration. No tense swelling. There is tenderness along the anterior aspect and medial aspect of the lower right leg. There is faint ecchymotic-like discoloration in the same area. This portion of her leg is tender. There is full range of motion of the knee and without pain. Full range of motion of the ankle and toes. Pedal pulses are 2+ and strong. She is able to wiggle her toes. There is normal warmth and capillary refill distally. The circumference in the right lower leg at the area of swelling is 35.8 cm. At the same level the left lower extremity is 34.0 cm.  Neurological: She is alert and oriented to person, place, and time. She exhibits normal muscle tone.  Skin: Skin is warm and dry. No rash noted. No erythema.  Psychiatric: She has a normal mood and affect.    ED Course  Procedures (including critical care time) Labs Review Labs Reviewed - No data to display Imaging Review No results found.  MDM   1. Anterior leg pain, right   2. Leg edema, right    The patient was sent for outpatient venous duplex of the right lower extremity to rule out DVT. They called report to Nira Conn R.N and the results were negative for DVT. The etiology of the pain and swelling is uncertain but may be due to a superficial phlebitis or something as simple as a muscle strain with ambulation. We will have her apply heat to the area and followup with her PCP early next week. Should she have increasing symptoms or worsening she will need to seek medical care probably to the emergency department.  Hayden Rasmussen, NP 12/22/12 [phone]

## 2012-12-22 NOTE — Progress Notes (Signed)
*  PRELIMINARY RESULTS* Vascular Ultrasound Right lower extremity venous duplex has been completed.  Preliminary findings: negative for DVT and baker's cyst.   Farrel Demark, RDMS, RVT  12/22/2012, 4:25 PM

## 2012-12-22 NOTE — ED Notes (Signed)
Pt   Taken to  Vascular  Lab     For [place] study

## 2012-12-22 NOTE — ED Notes (Signed)
Pt  Reports   She   Has  Pain /  Swelling  r  Leg   With  Bruising  Which  She  Reports  Began today     She  Reports  Released  From hospital yest  She  Said  Her  Blood  Was  Low       She  denys  Any  Chest pain or  Shortness  Of  Breath         She  Also reports  A  Headache    She  Is  Sitting upright on  Exam  Table  Speaking in  Complete  sentances

## 2012-12-26 NOTE — ED Provider Notes (Signed)
Medical screening examination/treatment/procedure(s) were performed by resident physician or non-physician practitioner and as supervising physician I was immediately available for consultation/collaboration.   Barkley Bruns MD.   Linna Hoff, MD 12/26/12 [phone]

## 2013-01-23 IMAGING — US US OB FOLLOW-UP
1 series · 14 of 28 positions shown · non-contrast
Comparison: none

[Series 1: us ob follow-up · 0.23mm/px · 14 of 36 slices shown]
[im 2/36]
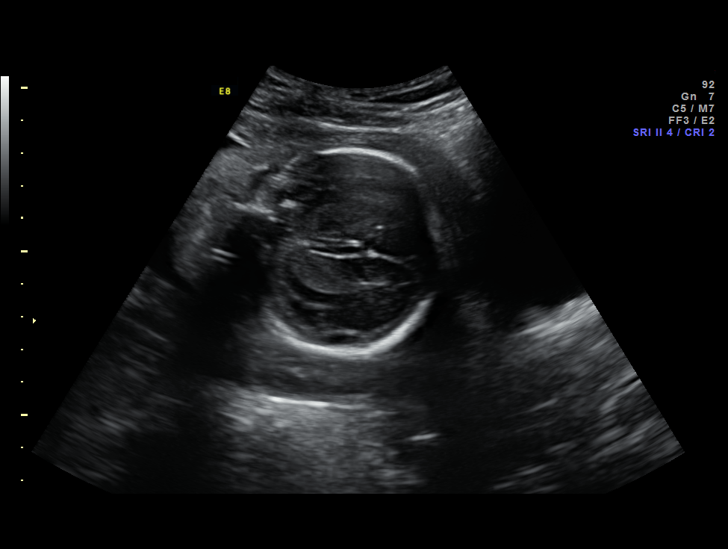
[im 4/36]
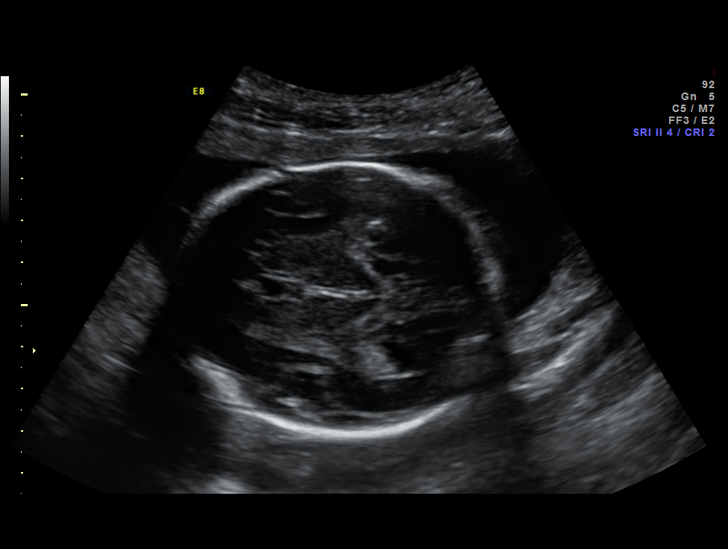
[im 7/36]
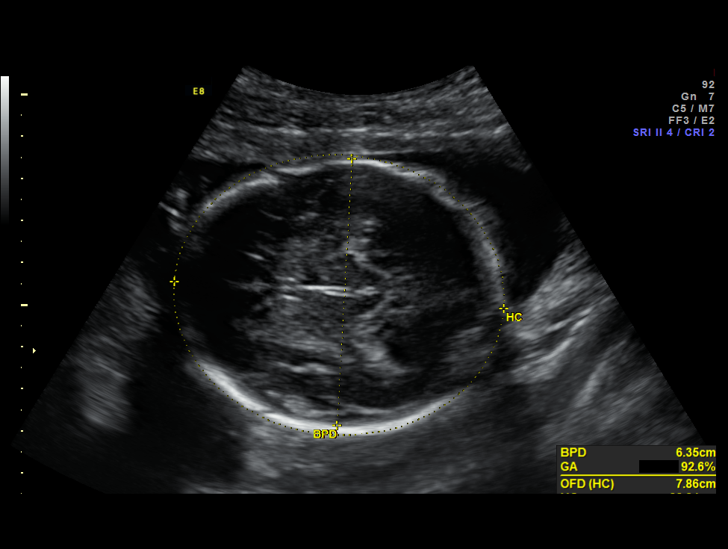
[im 10/36]
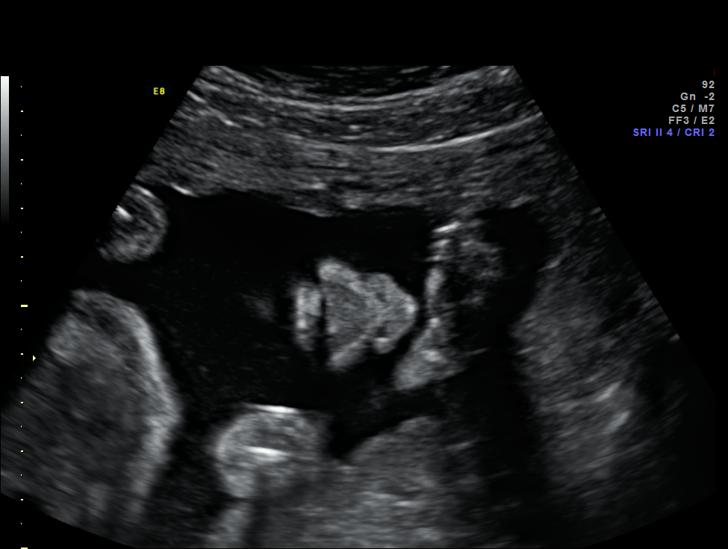
[im 12/36]
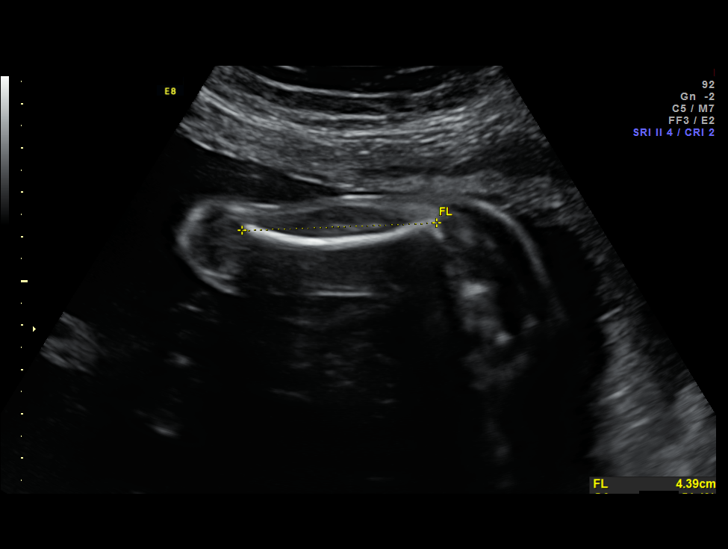
[im 15/36]
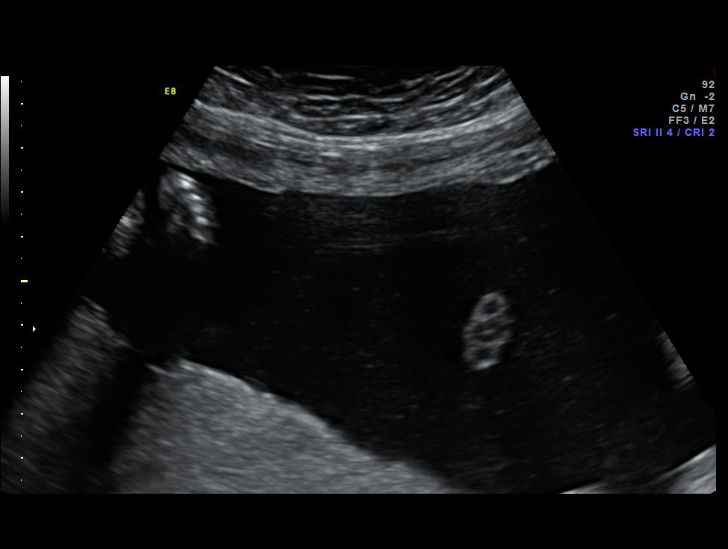
[im 17/36]
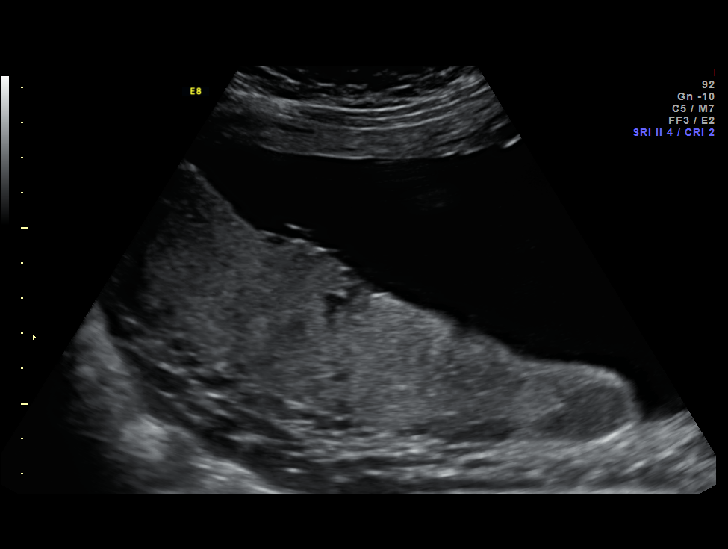
[im 20/36]
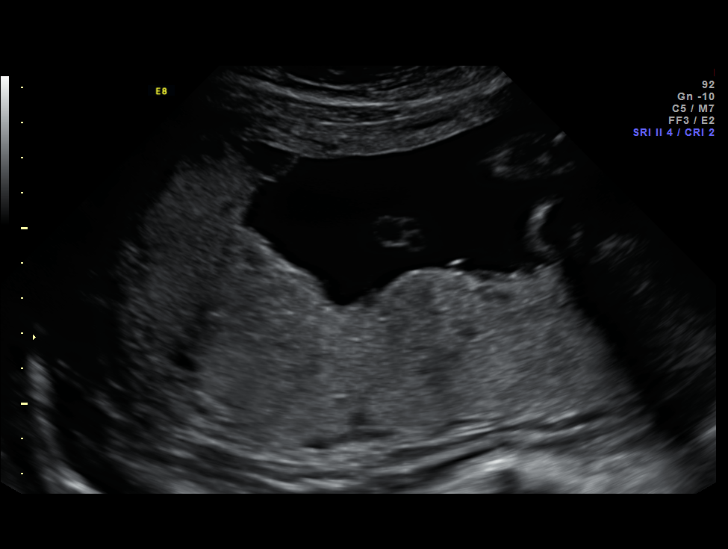
[im 23/36]
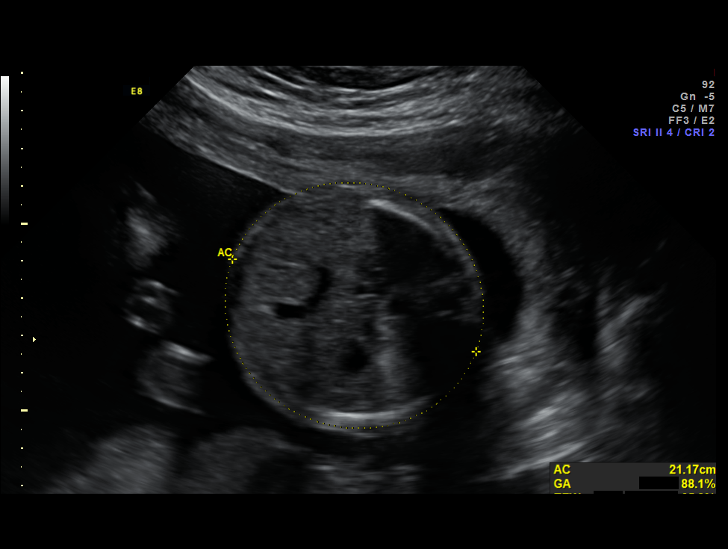
[im 25/36]
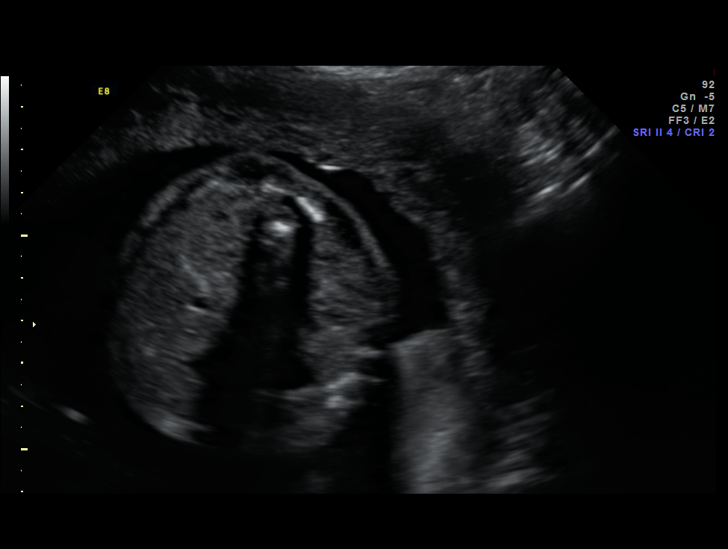
[im 28/36]
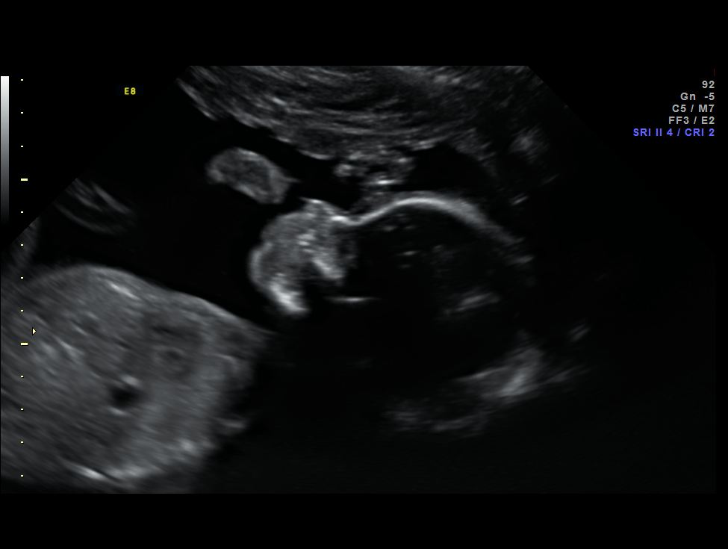
[im 30/36]
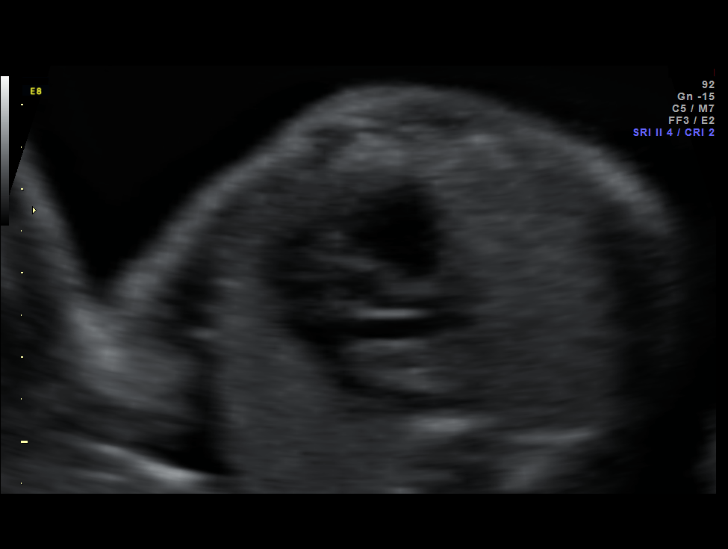
[im 33/36]
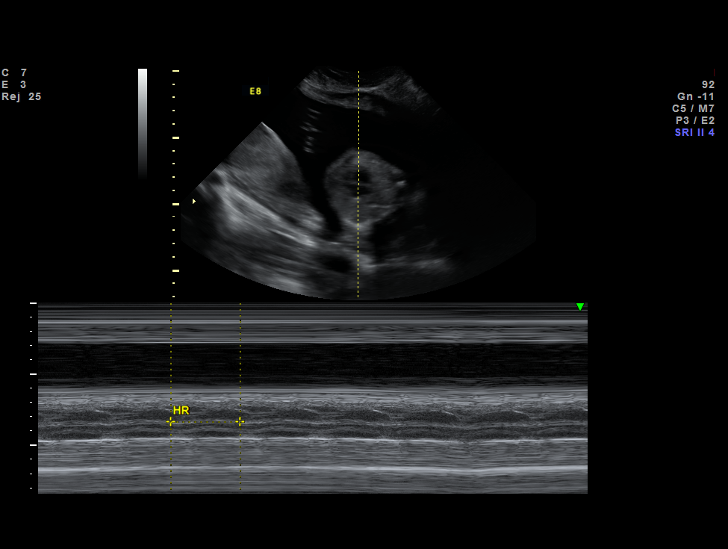
[im 36/36]
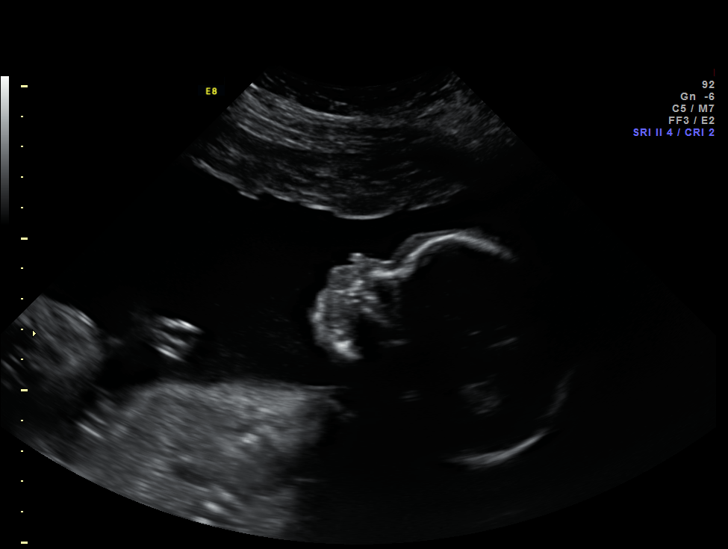

[14 of 28 positions shown; findings below may reference images not displayed]

Canned report from images found in remote index.

Refer to host system for actual result text.

## 2013-02-23 ENCOUNTER — Encounter: Payer: Medicaid Other | Admitting: Medical

## 2013-03-01 ENCOUNTER — Other Ambulatory Visit: Payer: Self-pay

## 2013-07-08 ENCOUNTER — Observation Stay (HOSPITAL_COMMUNITY)
Admission: EM | Admit: 2013-07-08 | Discharge: 2013-07-10 | Disposition: A | Discharge status: Home / Self Care | Payer: Medicaid Other | Attending: Family Medicine | Admitting: Family Medicine

## 2013-07-08 ENCOUNTER — Encounter (HOSPITAL_COMMUNITY): Payer: Self-pay | Admitting: Emergency Medicine

## 2013-07-08 DIAGNOSIS — I059 Rheumatic mitral valve disease, unspecified: Secondary | ICD-10-CM | POA: Insufficient documentation

## 2013-07-08 DIAGNOSIS — R1084 Generalized abdominal pain: Secondary | ICD-10-CM | POA: Insufficient documentation

## 2013-07-08 DIAGNOSIS — D649 Anemia, unspecified: Secondary | ICD-10-CM | POA: Diagnosis present

## 2013-07-08 DIAGNOSIS — R197 Diarrhea, unspecified: Secondary | ICD-10-CM | POA: Insufficient documentation

## 2013-07-08 DIAGNOSIS — D509 Iron deficiency anemia, unspecified: Principal | ICD-10-CM | POA: Insufficient documentation

## 2013-07-08 DIAGNOSIS — I079 Rheumatic tricuspid valve disease, unspecified: Secondary | ICD-10-CM | POA: Insufficient documentation

## 2013-07-08 DIAGNOSIS — D72819 Decreased white blood cell count, unspecified: Secondary | ICD-10-CM

## 2013-07-08 DIAGNOSIS — D573 Sickle-cell trait: Secondary | ICD-10-CM | POA: Insufficient documentation

## 2013-07-08 DIAGNOSIS — R630 Anorexia: Secondary | ICD-10-CM | POA: Insufficient documentation

## 2013-07-08 DIAGNOSIS — R0989 Other specified symptoms and signs involving the circulatory and respiratory systems: Secondary | ICD-10-CM | POA: Insufficient documentation

## 2013-07-08 DIAGNOSIS — H9209 Otalgia, unspecified ear: Secondary | ICD-10-CM | POA: Insufficient documentation

## 2013-07-08 DIAGNOSIS — R0609 Other forms of dyspnea: Secondary | ICD-10-CM | POA: Insufficient documentation

## 2013-07-08 DIAGNOSIS — R760 Raised antibody titer: Secondary | ICD-10-CM

## 2013-07-08 DIAGNOSIS — D6859 Other primary thrombophilia: Secondary | ICD-10-CM | POA: Insufficient documentation

## 2013-07-08 HISTORY — DX: Raised antibody titer: R76.0

## 2013-07-08 LAB — URINALYSIS, ROUTINE W REFLEX MICROSCOPIC
Bilirubin Urine: NEGATIVE
Glucose, UA: NEGATIVE mg/dL
Hgb urine dipstick: NEGATIVE
Ketones, ur: NEGATIVE mg/dL
Leukocytes, UA: NEGATIVE
Nitrite: NEGATIVE
Protein, ur: NEGATIVE mg/dL
Specific Gravity, Urine: 1.024 (ref 1.005–1.030)
Urobilinogen, UA: 0.2 mg/dL (ref 0.0–1.0)
pH: 5 (ref 5.0–8.0)

## 2013-07-08 LAB — CBC WITH DIFFERENTIAL/PLATELET
Basophils Absolute: 0 10*3/uL (ref 0.0–0.1)
Basophils Relative: 0 % (ref 0–1)
Eosinophils Absolute: 0 10*3/uL (ref 0.0–0.7)
Eosinophils Relative: 0 % (ref 0–5)
HCT: 23.8 % — ABNORMAL LOW (ref 36.0–46.0)
Hemoglobin: 7.1 g/dL — ABNORMAL LOW (ref 12.0–15.0)
Lymphocytes Relative: 17 % (ref 12–46)
Lymphs Abs: 0.9 10*3/uL (ref 0.7–4.0)
MCH: 16.3 pg — ABNORMAL LOW (ref 26.0–34.0)
MCHC: 29.8 g/dL — ABNORMAL LOW (ref 30.0–36.0)
MCV: 54.6 fL — ABNORMAL LOW (ref 78.0–100.0)
Monocytes Absolute: 0.3 10*3/uL (ref 0.1–1.0)
Monocytes Relative: 6 % (ref 3–12)
Neutro Abs: 3.9 10*3/uL (ref 1.7–7.7)
Neutrophils Relative %: 77 % (ref 43–77)
Platelets: 250 10*3/uL (ref 150–400)
RBC: 4.36 MIL/uL (ref 3.87–5.11)
RDW: 19.3 % — ABNORMAL HIGH (ref 11.5–15.5)
Smear Review: ADEQUATE
WBC: 5.1 10*3/uL (ref 4.0–10.5)

## 2013-07-08 LAB — BASIC METABOLIC PANEL
BUN: 9 mg/dL (ref 6–23)
CO2: 19 mEq/L (ref 19–32)
Calcium: 9 mg/dL (ref 8.4–10.5)
Chloride: 105 mEq/L (ref 96–112)
Creatinine, Ser: 0.88 mg/dL (ref 0.50–1.10)
GFR calc Af Amer: >90 mL/min (ref >90)
GFR calc non Af Amer: 84 mL/min — ABNORMAL LOW (ref >90)
Glucose, Bld: 99 mg/dL (ref 70–99)
Potassium: 4.2 mEq/L (ref 3.7–5.3)
Sodium: 137 mEq/L (ref 137–147)

## 2013-07-08 LAB — PREGNANCY, URINE: Preg Test, Ur: NEGATIVE

## 2013-07-08 MED ORDER — SODIUM CHLORIDE 0.9 % IV BOLUS (SEPSIS)
1000.0000 mL | Freq: Once | INTRAVENOUS | Status: AC
  Administered 2013-07-08: 1000 mL via INTRAVENOUS

## 2013-07-08 MED ORDER — ANTIPYRINE-BENZOCAINE 5.4-1.4 % OT SOLN
3.0000 [drp] | Freq: Once | OTIC | Status: AC
  Administered 2013-07-09: 3 [drp] via OTIC
  Filled 2013-07-08: qty 10

## 2013-07-08 MED ORDER — DIPHENOXYLATE-ATROPINE 2.5-0.025 MG PO TABS
2.0000 | ORAL_TABLET | Freq: Once | ORAL | Status: AC
Start: 2013-07-09 — End: 2013-07-09
  Administered 2013-07-09: 2 via ORAL
  Filled 2013-07-08: qty 2

## 2013-07-08 MED ORDER — LOPERAMIDE HCL 2 MG PO CAPS
4.0000 mg | ORAL_CAPSULE | Freq: Once | ORAL | Status: AC
  Administered 2013-07-08: 4 mg via ORAL
  Filled 2013-07-08: qty 2

## 2013-07-08 NOTE — ED Notes (Signed)
Pt alert, NAD, calm, interactive, resps e/u, speaking in clear complete sentences. EDPA J. Idol in to see pt.

## 2013-07-08 NOTE — ED Provider Notes (Signed)
Medical screening examination/treatment/procedure(s) were performed by non-physician practitioner and as supervising physician I was immediately available for consultation/collaboration.   EKG Interpretation None        Charles B. Sheldon, MD 07/08/13 2350 

## 2013-07-08 NOTE — ED Notes (Signed)
Pt. reports bilateral knee pain with headache , left ear ache and diarrhea onset this afternoon . Denies fever or chills / ambulatory / respirations unlabored.

## 2013-07-08 NOTE — ED Provider Notes (Signed)
CSN: [id number]     Arrival date & time 07/08/13  2044 History   First MD Initiated Contact with Patient 07/08/13 2145     Chief Complaint  Patient presents with  . Knee Pain  . Otalgia  . Diarrhea  . Headache     (Consider location/radiation/quality/duration/timing/severity/associated sxs/prior Treatment) HPI Comments: Latoya Guzman is a 38 y.o. Female with a history of sickle cell trait, lupus antigen, chronic anemia presenting with multiple complaints, the main being diarrhea which started when she woke this morning.  She reports frequent episodes of nonbloody , watery diarrhea along with left lower quadrant cramping pain which improves after bm's. She has had 10+ episodes of diarrhea.  She has mild nausea but no vomiting  She also describes  left earache and headache which she states started when she placed her daughters new hearing aid in this ear last night,  When she pulled it away, she developed a sharp constant ache which has not gone away.  She also describes chronic bilateral knee pain which is not new or different today.  Her 43 year old son currently also has diarrhea.  She denies dysuria, weakness or dizziness.  She has been sleepy this afternoon. She has had no medications prior to arrival for her symptoms.     The history is provided by the patient.    Past Medical History  Diagnosis Date  . Blood transfusion 1998    Post C/S 2units  . Depression     Was on Lexapro Stopped when pregnant  . Preterm labor     PTL last two pregnancies  . Fibroid 03/2010    Noted on [place] in Dec.  . 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 Tricuspid valve regurgitation 02/27/2010  . Lupus Lupus Antigen     associated with pregnancy   . Sickle cell trait   . Anemia 12/19/2012   Past Surgical History  Procedure Laterality Date  . Dilation and curettage of uterus  2005, 2006    1st for twin loss, 2nd for retained placenta  .  Cesarean section  1998, 2009  . Tubal ligation    . Cesarean section  11/14/2010    Procedure: CESAREAN SECTION;  Surgeon: Juliene Pina C. Hulan Fray, MD;  Location: [facility];  Service: Gynecology;  Laterality: N/A;  Repeat cesarean section with delivery of baby boy at 73. Apgars 9/9. Bilateral tubal ligation with filshie clips.    Family History  Problem Relation Age of Onset  . Hypertension Mother   . Lupus Sister   . Diabetes Maternal Uncle   . Cancer Maternal Grandmother    History  Substance Use Topics  . Smoking status: Never Smoker   . Smokeless tobacco: Never Used  . Alcohol Use: Yes     Comment: occ   OB History   Grav Para Term Preterm Abortions TAB SAB Ect Mult Living   10 8 6 2 2  2  1 7      Review of Systems    Allergies  Review of patient's allergies indicates no known allergies.  Home Medications  No current outpatient prescriptions on file. BP 123/67  Pulse 103  Temp(Src) 98.4 F (36.9 C) (Oral)  Resp 20  Wt 225 lb 5 oz (102.201 kg)  SpO2 97%  LMP 06/17/2013 Physical Exam  ED Course  Procedures (including critical care time) Labs Review Labs Reviewed  CBC WITH DIFFERENTIAL - Abnormal; Notable for  the following:    Hemoglobin 7.1 (*)    HCT 23.8 (*)    MCV 54.6 (*)    MCH 16.3 (*)    MCHC 29.8 (*)    RDW 19.3 (*)    All other components within normal limits  BASIC METABOLIC PANEL - Abnormal; Notable for the following:    GFR calc non Af Amer 84 (*)    All other components within normal limits  URINALYSIS, ROUTINE W REFLEX MICROSCOPIC  PREGNANCY, URINE   Imaging Review No results found.   EKG Interpretation None      MDM   Final diagnoses:  None    At re-exam, labs reviewed with patient including her hgb.  She does report having increased fatigue and transient lightheadedness for about the past 2 weeks similar to the symptoms she experienced last August when she last needed to be transfused. At that time it was felt her anemia was a result of  her menorrhagia, which she still endorses.   Discussed with Dr Lita Mains and will plan admission for transfusion.  Temp admit orders given    Evalee Jefferson, PA-C 07/09/13 6629

## 2013-07-08 NOTE — ED Provider Notes (Signed)
MSE was initiated and I personally evaluated the patient and placed orders (if any) at  10:30 PM on July 08, 2013.  The patient appears stable so that the remainder of the MSE may be completed by another provider.   LEONTINA SKIDMORE is a 38 y.o. Female, with PMH of lupus, anemia, tricuspid and mitral valve regurgitation, who presents to the Emergency Department complaining of generalized body aches, left otalgia, left anterior neck pain, and diarrhea that started this morning. States that she woke up this morning having excessive diarrhea that has only worsened throughout the day. She states that there have been over ten episodes but denies any blood in the stool. She has complained up associated nausea and abdominal pain, but denies any vomiting. She states that her left ear starting hurting last night after she placed her daughter's hearing aid in her left ear to check its function. States that when she pulled it away from her ear, there was a sharp, constant pain that went through her inner ear and has not gone away. States that her left anterior neck started hurting this am, but denies any throat pain.  Further more, she report prior hx of lupus associated blood clots during pregnancy but currently not taking any anticoagulants.    Patients with multiple complaints, most notable is the persistent diarrhea.  She is tachycardic, mildly tachypneic, and will need to be evaluated in the main ED.    Domenic Moras, PA-C 07/08/13 2232

## 2013-07-09 DIAGNOSIS — R894 Abnormal immunological findings in specimens from other organs, systems and tissues: Secondary | ICD-10-CM

## 2013-07-09 LAB — IRON AND TIBC
IRON: 15 ug/dL — AB (ref 42–135)
SATURATION RATIOS: 4 % — AB (ref 20–55)
TIBC: 410 ug/dL (ref 250–470)
UIBC: 395 ug/dL (ref 125–400)

## 2013-07-09 LAB — CBC
HCT: 25 % — ABNORMAL LOW (ref 36.0–46.0)
Hemoglobin: 7.6 g/dL — ABNORMAL LOW (ref 12.0–15.0)
MCH: 17.5 pg — ABNORMAL LOW (ref 26.0–34.0)
MCHC: 30.4 g/dL (ref 30.0–36.0)
MCV: 57.6 fL — ABNORMAL LOW (ref 78.0–100.0)
Platelets: 236 10*3/uL (ref 150–400)
RBC: 4.34 MIL/uL (ref 3.87–5.11)
RDW: 22.7 % — AB (ref 11.5–15.5)
WBC: 2.6 10*3/uL — ABNORMAL LOW (ref 4.0–10.5)

## 2013-07-09 LAB — PREPARE RBC (CROSSMATCH)

## 2013-07-09 MED ORDER — INFLUENZA VAC SPLIT QUAD 0.5 ML IM SUSP
0.5000 mL | INTRAMUSCULAR | Status: AC
  Administered 2013-07-10: 0.5 mL via INTRAMUSCULAR
  Filled 2013-07-09: qty 0.5

## 2013-07-09 MED ORDER — ONDANSETRON HCL 4 MG/2ML IJ SOLN
4.0000 mg | Freq: Three times a day (TID) | INTRAMUSCULAR | Status: DC | PRN
Start: 2013-07-09 — End: 2013-07-10

## 2013-07-09 MED ORDER — ACETAMINOPHEN 650 MG RE SUPP: 650.0000 mg | Freq: Four times a day (QID) | RECTAL | Status: DC | PRN

## 2013-07-09 MED ORDER — SODIUM CHLORIDE 0.9 % IV SOLN: INTRAVENOUS | Status: DC

## 2013-07-09 MED ORDER — DIPHENHYDRAMINE HCL 50 MG/ML IJ SOLN
25.0000 mg | Freq: Three times a day (TID) | INTRAMUSCULAR | Status: DC | PRN
  Administered 2013-07-09: 25 mg via INTRAVENOUS
  Filled 2013-07-09: qty 1

## 2013-07-09 MED ORDER — ACETAMINOPHEN 325 MG PO TABS: 650.0000 mg | ORAL_TABLET | Freq: Four times a day (QID) | ORAL | Status: DC | PRN

## 2013-07-09 MED ORDER — ONDANSETRON HCL 4 MG/2ML IJ SOLN: 4.0000 mg | Freq: Three times a day (TID) | INTRAMUSCULAR | Status: DC | PRN

## 2013-07-09 NOTE — H&P (Signed)
[facility] Admission History and Physical Service Pager: [phone]  Patient name: Latoya Guzman Medical record number: [id number] Date of birth: 04-Jan-1976 Age: 38 y.o. Gender: female  Primary Care Provider: No PCP Per Patient Consultants: none Code Status: full (discussed w/ pt upon admission)  Chief Complaint: diarrhea, weakness, ear pain   Assessment and Plan: Latoya Guzman is a 38 y.o. female presenting with diarrhea and found to be anemic. PMH is significant for lupus anticoagulant (not currently on anticoagulation), mitral and tricuspid valve regurgitation, sickle cell trait and iron def. Anemia.    #Microcytic Anemia: History of anemia secondary to menorrhagia and dysfunctional uterine bleeding and iron deficiency.  She was admitted in August 2014 with Hgb 6.3 and discharged with Hgb 7.5. She had transvaginal and pelvic ultrasound done in 2013 which had shown normal endometrial thickness, fibroid in the lower uterine segment about 2 cm, adenomyosis. Her weakness is most likely related to her anemia.  - admitted for observation, Dr. Nori Riis attending  - Type and screen  - transfuse 1 U PRBC - f/u CBC  - iron and TIBC   #Nausea/ab pain/Diarrhea: most likely related to viral gastroenteritis.  - NS 100 mL/hr  - Zofran for nausea   #antiphospholipid antibody positive: was advised to follow up with Rheumatology as outpatient. MRI and EEG were normal in August 2014 (had historical unilateral numbness at that time, possibly concerning for stroke). Currently not on any coagulation. Hasn't been on any anticoagulation for two years. She stopped taking it herself.   FEN/GI: NS 100 mL/hr/ regular diet  Prophylaxis: SCDs  Disposition: admitted to family medicine teaching service for observation, Dr. Nori Riis attending.   History of Present Illness: Latoya Guzman is a 38 y.o. female presenting with feeling weak, upset stomach, and diarrhea and left ear ache.  Feeling weak off and on for two to three weeks and goes away. Taking an iron pill improves her weakness.  She does not take her iron pills every day since she ran out. LMP was last month around the ~20th. It usually lasts 7 days. Patient goes through 1/2 box of tampons (26 tampons in one box) and wears a pad with the tampon.  She was admitted in August 2014 and found to be anemic. She has received blood two times during her life, once in 1998 after a c-section and in August 2014.  Today she is mainly complaining of weakness.  She does not currently have a PCP and hasn't seen one in two years.   Started having diarrhea this morning.  She had +10 episodes of diarrhea.  It was watery, green in nature. Some nausea and not vomiting. She endorses chills but no fevers.  Her 36 year old is sick right now with similar symptoms. She has generalized abdominal pain today but feels better now.  It was achy earlier today but now is dull.  She has had three c-sections that were uncomplicated.    She placed her daughter's hearing aid in her ear to see if it was working. She heard a high pitched noise and it has been ever since.    She has a decreased appetite and only eats 1-2 meals a day.    Patient has had dyspnea the past two to three weeks. She will walk up 2-3 steps and becomes out of breath. She felt like her heart was racing the other. Has not started or stopped any new medications.  Stopped anticoagulation herself two years ago.  Review Of Systems: Per HPI with the following additions: See HPI  Otherwise 12 point review of systems was performed and was unremarkable.  Patient Active Problem List   Diagnosis Date Noted  . Chest pain 12/19/2012  . Anemia 12/19/2012  . Left sided numbness 12/19/2012  . Depression 03/26/2011  . Elevated LFTs 12/25/2010  . Encounter for long-term (current) use of anticoagulants 11/18/2010  . Antiphospholipid antibody positive 08/25/2007   Past Medical History: Past  Medical History  Diagnosis Date  . Blood transfusion 1998    Post C/S 2units  . Depression     Was on Lexapro Stopped when pregnant  . Preterm labor     PTL last two pregnancies  . Fibroid 03/2010    Noted on [place] in Dec.  . 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 Tricuspid valve regurgitation 02/27/2010  . Lupus Lupus Antigen     associated with pregnancy   . Sickle cell trait   . Anemia 12/19/2012   Past Surgical History: Past Surgical History  Procedure Laterality Date  . Dilation and curettage of uterus  2005, 2006    1st for twin loss, 2nd for retained placenta  . Cesarean section  1998, 2009  . Tubal ligation    . Cesarean section  11/14/2010    Procedure: CESAREAN SECTION;  Surgeon: Juliene Pina C. Hulan Fray, MD;  Location: [facility];  Service: Gynecology;  Laterality: N/A;  Repeat cesarean section with delivery of baby boy at 10. Apgars 9/9. Bilateral tubal ligation with filshie clips.    Social History: History  Substance Use Topics  . Smoking status: Never Smoker   . Smokeless tobacco: Never Used  . Alcohol Use: Yes     Comment: occ   Additional social history: none  Please also refer to relevant sections of EMR.  Family History: Family History  Problem Relation Age of Onset  . Hypertension Mother   . Lupus Sister   . Diabetes Maternal Uncle   . Cancer Maternal Grandmother    Allergies and Medications: No Known Allergies No current facility-administered medications on file prior to encounter.   No current outpatient prescriptions on file prior to encounter.    Objective: BP 115/62  Pulse 94  Temp(Src) 98.4 F (36.9 C) (Oral)  Resp 18  Wt 225 lb 5 oz (102.201 kg)  SpO2 99%  LMP 06/17/2013 Exam: General: NAD, African American female, cooperative with exam  HEENT: TM's clear and intact b/l, oropharynx clear with somewhat dry MM, EOMI, PERRL Cardiovascular: RRR, S1S2, no murmurs, gallops or clicks   Respiratory: CTAB, no extra work of breathing, no wheezes, crackles or rhonchi Abdomen: soft, +BS, mildly tender throughout, no HSM, no rebound tenderness or guarding Extremities: +2 pulses in upper and lower extremities b/l, moves all extremities freely Skin: warm and dry, no rashes  Neuro: alert and oriented, no focal deficits.   Labs and Imaging: CBC BMET   Recent Labs Lab 07/08/13 2256  WBC 5.1  HGB 7.1*  HCT 23.8*  PLT 250    Recent Labs Lab 07/08/13 2256  NA 137  K 4.2  CL 105  CO2 19  BUN 9  CREATININE 0.88  GLUCOSE 99  CALCIUM 9.0     Urinalysis    Component Value Date/Time   COLORURINE YELLOW 07/08/2013 2250   APPEARANCEUR CLEAR 07/08/2013 2250   LABSPEC 1.024 07/08/2013 2250   PHURINE 5.0 07/08/2013 2250   GLUCOSEU NEGATIVE 07/08/2013  [place] 07/08/2013 [place] 07/08/2013 2250   KETONESUR NEGATIVE 07/08/2013 2250   PROTEINUR NEGATIVE 07/08/2013 2250   UROBILINOGEN 0.2 07/08/2013 2250   NITRITE NEGATIVE 07/08/2013 2250   LEUKOCYTESUR NEGATIVE 07/08/2013 2250   preg test: negative    Rosemarie Ax, MD 07/09/2013, 1:03 AM PGY-1, [place] Intern pager: [phone], text pages welcome   Upper Level Addendum:  I have seen and evaluated this patient along with Dr. Raeford Razor and reviewed the above note, making necessary revisions.  Chrisandra Netters, MD Family Medicine PGY-2

## 2013-07-09 NOTE — ED Provider Notes (Signed)
Medical screening examination/treatment/procedure(s) were performed by non-physician practitioner and as supervising physician I was immediately available for consultation/collaboration.   EKG Interpretation None        Julianne Rice, MD 07/09/13 [phone]

## 2013-07-09 NOTE — Progress Notes (Signed)
Family Medicine Teaching Service Daily Progress Note Intern Pager: [phone]  Patient name: Latoya Guzman Medical record number: [id number] Date of birth: [birth year] Age: 38 y.o. Gender: female  Primary Care Provider: No PCP Per Patient Consultants: none Code Status: full   Pt Overview and Major Events to Date:  3/16: admitted for weakness. Hgb 7.1, transfused 1 U PRBC  Assessment and Plan: Latoya Guzman is a 38 y.o. female presenting with diarrhea and found to be anemic. PMH is significant for lupus anticoagulant (not currently on anticoagulation), mitral and tricuspid valve regurgitation, sickle cell trait and iron def. Anemia.   #Microcytic Anemia: Improving. Weakness is improving.  - transfuse 1 U PRBC  - post transfusion CBC  - iron and TIBC: pending  - Benadryl PRN   #Nausea/ab pain/Diarrhea: most likely related to viral gastroenteritis.  - NS 100 mL/hr  - Zofran for nausea   #antiphospholipid antibody positive: was advised to follow up with Rheumatology as outpatient. MRI and EEG were normal in August 2014 (had historical unilateral numbness at that time, possibly concerning for stroke). Currently not on any coagulation. Hasn't been on any anticoagulation for two years. She stopped taking it herself.   FEN/GI: NS 100 mL/hr/ regular diet  Prophylaxis: SCDs  Disposition: pending improvement   Subjective: patient feeling better this AM. Her weakness has improved.   Objective: Temp:  [97.8 F (36.6 C)-98.5 F (36.9 C)] 98.2 F (36.8 C) (03/16 0801) Pulse Rate:  [76-103] 78 (03/16 0801) Resp:  [16-20] 16 (03/16 0801) BP: (111-123)/(61-83) 111/65 mmHg (03/16 0801) SpO2:  [97 %-100 %] 100 % (03/16 0801) Weight:  [224 lb 10.4 oz (101.9 kg)-225 lb 5 oz (102.201 kg)] 224 lb 10.4 oz (101.9 kg) (03/16 0204) Physical Exam: General: NAD, African American female, cooperative with exam  HEENT:EOMI,  Cardiovascular: RRR, S1S2, no murmurs, gallops or clicks  Respiratory:  CTAB, no extra work of breathing, no wheezes, crackles or rhonchi  Extremities:  moves all extremities freely  Skin: warm and dry, no rashes  Neuro: alert and oriented, no focal deficits.   Laboratory:  Recent Labs Lab 07/08/13 2256  WBC 5.1  HGB 7.1*  HCT 23.8*  PLT 250    Recent Labs Lab 07/08/13 2256  NA 137  K 4.2  CL 105  CO2 19  BUN 9  CREATININE 0.88  CALCIUM 9.0  GLUCOSE 99   Imaging/Diagnostic Tests:   Rosemarie Ax, MD 07/09/2013, 8:51 AM PGY-1, [place] Intern pager: [phone], text pages welcome

## 2013-07-09 NOTE — H&P (Signed)
FMTS Attending Admission Note: Latoya Mcmurray MD [phone] pager office [phone] I  have seen and examined this patient, reviewed their chart. I have discussed this patient with the resident. I agree with the resident's findings, assessment and care plan. She is feeling much better this morning with no more diarrhea, improved nausea. Receiving transfusion. Likely d/c home this afternoon. I would rec f/u with her PCP and rec she also discuss (or re-discuss) antcoagulation given her hx lupus anticoagulant

## 2013-07-09 NOTE — Progress Notes (Signed)
UR completed 

## 2013-07-09 NOTE — Progress Notes (Addendum)
Patient admitted to unit. Oriented to room, call bell, and staff. Bed in lowest position. Fall safety plan reviewed. Full assessment to Epic. Page to [facility] for notification of arrival to floor. Will continue to monitor. Toniann Ket, RN

## 2013-07-09 NOTE — Progress Notes (Signed)
FMTS Attending Note  The plan of care was discussed with the resident team. I agree with the assessment and plan as documented by the resident.   Kyle Fletke MD 

## 2013-07-10 ENCOUNTER — Encounter (HOSPITAL_COMMUNITY): Payer: Self-pay | Admitting: Family Medicine

## 2013-07-10 DIAGNOSIS — D72819 Decreased white blood cell count, unspecified: Secondary | ICD-10-CM

## 2013-07-10 DIAGNOSIS — D649 Anemia, unspecified: Secondary | ICD-10-CM

## 2013-07-10 LAB — CBC
HEMATOCRIT: 27.3 % — AB (ref 36.0–46.0)
Hemoglobin: 8.1 g/dL — ABNORMAL LOW (ref 12.0–15.0)
MCH: 17.2 pg — ABNORMAL LOW (ref 26.0–34.0)
MCHC: 29.7 g/dL — ABNORMAL LOW (ref 30.0–36.0)
MCV: 57.8 fL — AB (ref 78.0–100.0)
Platelets: 244 10*3/uL (ref 150–400)
RBC: 4.72 MIL/uL (ref 3.87–5.11)
RDW: 22.5 % — AB (ref 11.5–15.5)
WBC: 5.1 10*3/uL (ref 4.0–10.5)

## 2013-07-10 LAB — TYPE AND SCREEN
ABO/RH(D): B POS
ANTIBODY SCREEN: NEGATIVE
Unit division: 0

## 2013-07-10 LAB — SEDIMENTATION RATE: Sed Rate: 12 mm/hr (ref 0–22)

## 2013-07-10 MED ORDER — SODIUM CHLORIDE 0.9 % IV SOLN
1020.0000 mg | Freq: Once | INTRAVENOUS | Status: AC
  Administered 2013-07-10: 1020 mg via INTRAVENOUS
  Filled 2013-07-10: qty 34

## 2013-07-10 MED ORDER — FERROUS SULFATE 325 (65 FE) MG PO TABS: 325.0000 mg | ORAL_TABLET | Freq: Two times a day (BID) | ORAL | Status: DC

## 2013-07-10 NOTE — Progress Notes (Addendum)
FMTS Attending Note  I personally saw and evaluated the patient. The plan of care was discussed with the resident team. I agree with the assessment and plan as documented by the resident.   In regards to patients antiphospholipid syndrome she reports being diagnosed approximately 9 years ago, she reports history of two miscarriages, no history of DVT/PE, has been on anticoagulation for a number of her pregnancies, was last on anticoagulation two years ago, stopped this on her own, was seen by a hematologist once in the past, she is unsure if she was told that she requires lifelong anticoagulation. She was referred to Rheumatology for follow up of this issue however was never evaluated.   Resident physician has reordered antiphospholipid antibodies. He spoke to hematology via telephone and they do not feel that is is necessary for her to be on lifelong anticoagulation. According to the APLASA study (The Antiphospholipid Antibody Acetylsalicylic Acid Study) there was no benefit to anticoagulation with aspirin over placebo. Due to the patients chronic anemia and leukocytosis, hematology requested that a number of labs be ordered as an outpatient including ANA/B12/Folate/Abd [place] to rule out splenomegaly/Hepatits Panel/Sed rate to rule out leukemia.  Patient is stable for discharge and will be scheduled to follow up at [facility] later this week.  Dossie Arbour MD

## 2013-07-10 NOTE — Discharge Summary (Signed)
[facility] Discharge Summary  Patient name: Latoya Guzman Medical record number: [id number] Date of birth: 09/20/1975 Age: 38 y.o. Gender: female Date of Admission: 07/08/2013  Date of Discharge: 07/10/13 Admitting Physician: Dickie La, MD  Primary Care Provider: Rosemarie Ax, MD Consultants: none  Indication for Hospitalization: weakness, anemia   Discharge Diagnoses/Problem List:  Patient Active Problem List   Diagnosis Date Noted  . Phlebitis after infusion 07/13/2013  . Chest pain 12/19/2012  . Anemia 12/19/2012  . Left sided numbness 12/19/2012  . Depression 03/26/2011  . Elevated LFTs 12/25/2010  . Encounter for long-term (current) use of anticoagulants 11/18/2010  . Antiphospholipid antibody positive 08/25/2007   Disposition: home   Discharge Condition: improved  Discharge Exam:  General: NAD, African American female, cooperative with exam  HEENT:EOMI,  Cardiovascular: RRR, S1S2, no murmurs, gallops or clicks  Respiratory: CTAB, no extra work of breathing, no wheezes, crackles or rhonchi  MSK: tender to palpation on left midaxillary between 7th and 9th rib  Extremities: moves all extremities freely  Skin: warm and dry, no rashes  Neuro: alert and oriented, no focal deficits.  Brief Hospital Course:  Latoya Guzman is a 38 y.o. female presenting with diarrhea and found to be anemic. PMH is significant for lupus anticoagulant (not currently on anticoagulation), mitral and tricuspid valve regurgitation, sickle cell trait and iron def. Anemia.   #Microcytic Anemia: Patient presented with symptomatic anemia to Hgb 7.1. She was transfused 1 U PRBC.  She was found to iron deficiency with iron (15) and saturation TIBC (4%). She was given 1020g of IV fareheme. Her anemia had improved and was no longer symptomatic. She was discharged on 325 mg ferrous sulfate BID on discharge.    #Nausea/ab pain/Diarrhea: On intial presentation but  resolved with zofran.   #antiphospholipid antibody positive: She presented with no anticoagulation. Upon reviewing her chart, there was a questionable hisotry after she had two spontaneous abortions. This was added to her problem list despite no lab findings. She had no history of DVT or PE. Will need further workup as described below on follow up.   Issues for Follow Up:  1. Discussed her antiphospholipid history with Heme/Onc prior to discharge. They advised ordering Hepatitis panel, ANA, B12, Folate, HIV, Sed rate, smear review by pathologist, Cardiolipin antibodies, and Beta-2-glycoproteins. Heme/Onc did not advise starting anticoagulation unless patient had a history of DVT/PE. Heme/onc was more worried about her leukopenia with the thought of a possibly of lymphoma or leukemia.  2. Microcytic anemia: discharge on iron BID. May need an increase in iron to three times a day if she is still relatively anemia.  Possibly f/u CBC.   Significant Procedures: none  Significant Labs and Imaging:   Recent Labs Lab 07/08/13 2256 07/09/13 1215 07/10/13 1830  WBC 5.1 2.6* 5.1  HGB 7.1* 7.6* 8.1*  HCT 23.8* 25.0* 27.3*  PLT 250 236 244    Recent Labs Lab 07/08/13 2256  NA 137  K 4.2  CL 105  CO2 19  GLUCOSE 99  BUN 9  CREATININE 0.88  CALCIUM 9.0   Urinalysis    Component Value Date/Time   COLORURINE YELLOW 07/08/2013 2250   APPEARANCEUR CLEAR 07/08/2013 2250   LABSPEC 1.024 07/08/2013 2250   PHURINE 5.0 07/08/2013 2250   GLUCOSEU NEGATIVE 07/08/2013 2250   HGBUR NEGATIVE 07/08/2013 2250   BILIRUBINUR NEGATIVE 07/08/2013 2250   KETONESUR NEGATIVE 07/08/2013 2250   PROTEINUR NEGATIVE 07/08/2013 2250   UROBILINOGEN  0.2 07/08/2013 2250   NITRITE NEGATIVE 07/08/2013 2250   LEUKOCYTESUR NEGATIVE 07/08/2013 2250   Iron/TIBC/Ferritin    Component Value Date/Time   IRON 15* 07/09/2013 0430   TIBC 410 07/09/2013 0430   FERRITIN 5* 12/20/2012 1100    Pregnancy test: negative    Results/Tests Pending at Time of Discharge:   Discharge Medications:    Medication List         ferrous sulfate 325 (65 FE) MG tablet  Commonly known as:  FERROUSUL  Take 1 tablet (325 mg total) by mouth 2 (two) times daily after a meal.        Discharge Instructions: Please refer to Patient Instructions section of EMR for full details.  Patient was counseled important signs and symptoms that should prompt return to medical care, changes in medications, dietary instructions, activity restrictions, and follow up appointments.   Follow-Up Appointments: Follow-up Information   Follow up with Rosemarie Ax, MD On 07/23/2013. (2:00)    Specialty:  Family Medicine   Contact information:   [place] [address] [phone]       Rosemarie Ax, MD 07/15/2013, 9:04 PM PGY-1, [place]

## 2013-07-10 NOTE — Progress Notes (Signed)
Family Medicine Teaching Service Daily Progress Note Intern Pager: [phone]  Patient name: Latoya Guzman Medical record number: [id number] Date of birth: 06-25-1975 Age: 38 y.o. Gender: female  Primary Care Provider: No PCP Per Patient Consultants: none Code Status: full   Pt Overview and Major Events to Date:  3/16: admitted for weakness. Hgb 7.1, transfused 1 U PRBC 3/17: post transfusion Hgb 7.6  Assessment and Plan: Latoya Guzman is a 38 y.o. female presenting with diarrhea and found to be anemic. PMH is significant for lupus anticoagulant (not currently on anticoagulation), mitral and tricuspid valve regurgitation, sickle cell trait and iron def. Anemia.   #Microcytic Anemia: Improving. Reports to starting to her period.  - transfused 1 U PRBC yesterday - post transfusion Hgb: 7.6 - iron: 15  - TIBC:4  - Benadryl PRN   #Nausea/ab pain/Diarrhea: Resolved. Most likely related to viral gastroenteritis.  - NS 100 mL/hr  - Zofran for nausea   #antiphospholipid antibody positive: Currently not on any coagulation. Hasn't been on any anticoagulation for two years. She stopped taking it herself.  - will need management as outpatient once PCP is established.   FEN/GI: NS 100 mL/hr/ regular diet  Prophylaxis: SCDs  Disposition: pending improvement   Subjective: patient started having pain in her left side. She did not report any of this to the nurse. She started having a period this morning as well. Her weakness has improved.   Objective: Temp:  [97.2 F (36.2 C)-98.3 F (36.8 C)] 97.7 F (36.5 C) (03/17 0527) Pulse Rate:  [75-87] 75 (03/17 0527) Resp:  [16-17] 16 (03/17 0527) BP: (112-121)/(57-75) 117/63 mmHg (03/17 0527) SpO2:  [100 %] 100 % (03/17 0527) Physical Exam: General: NAD, African American female, cooperative with exam  HEENT:EOMI,  Cardiovascular: RRR, S1S2, no murmurs, gallops or clicks  Respiratory: CTAB, no extra work of breathing, no wheezes,  crackles or rhonchi MSK: tender to palpation on left midaxillary between 7th and 9th rib   Extremities:  moves all extremities freely  Skin: warm and dry, no rashes  Neuro: alert and oriented, no focal deficits.   Laboratory:  Recent Labs Lab 07/08/13 2256 07/09/13 1215  WBC 5.1 2.6*  HGB 7.1* 7.6*  HCT 23.8* 25.0*  PLT 250 236    Recent Labs Lab 07/08/13 2256  NA 137  K 4.2  CL 105  CO2 19  BUN 9  CREATININE 0.88  CALCIUM 9.0  GLUCOSE 99   Imaging/Diagnostic Tests:   Rosemarie Ax, MD 07/10/2013, 8:26 AM PGY-1, [place] Intern pager: [phone], text pages welcome

## 2013-07-10 NOTE — Progress Notes (Signed)
Patient discharged.  Educated on discharge instructions, follow-up appointments, and discharge medications.  Patient verbalized understanding, AVS signed.  Patient prescriptions sent electronically to [facility].  IV removed.  Patient educated on iron-deficiency anemia, signs and symptoms, and when to call a doctor.  Patient verbalized understanding.  Belongings gathered.

## 2013-07-10 NOTE — Discharge Instructions (Signed)
Latoya Guzman, you were admitted and found to be anemic (low blood counts). We gave you blood and iron. We will have you take iron twice a day. This will help your blood counts and help you make more blood.   We will start you on some Iron pills. Please take these two times a day in between meals. Also if you take these with something acidic (orange juice), it will help you absorb the pills better.   Please follow up with the family medicine clinic as scheduled.    Iron Deficiency Anemia, Adult Anemia is a condition in which there are less red blood cells or hemoglobin in the blood than normal. Hemoglobin is this part of red blood cells that carries oxygen. Iron deficiency anemia is anemia caused by too little iron. It is the most common type of anemia. It may leave you tired and short of breath. CAUSES   Lack of iron in the diet.  Poor absorption of iron, as seen with intestinal disorders.  Intestinal bleeding.  Heavy periods. SIGNS AND SYMPTOMS  Mild anemia may not be noticeable. Symptoms may include:  Fatigue.  Headache.  Pale skin.  Weakness.  Tiredness.  Shortness of breath.  Dizziness.  Cold hands and feet.  Fast or irregular heartbeat. DIAGNOSIS  Diagnosis requires a thorough evaluation and physical exam by your health care provider. Blood tests are generally used to confirm iron deficiency anemia. Additional tests may be done to find the underlying cause of your anemia. These may include:  Testing for blood in the stool (fecal occult blood test).  A procedure to see inside the colon and rectum (colonoscopy).  A procedure to see inside the esophagus and stomach (endoscopy). TREATMENT  Iron deficiency anemia is treated by correcting the cause of the deficiency. Treatment may involve:  Adding iron-rich foods to your diet.  Taking iron supplements. Pregnant or breastfeeding women need to take extra iron, because their normal diet usually does not provide the  required amount.  Taking vitamins. Vitamin C improves the absorption of iron. Your health care provider may recommend taking your iron tablets with a glass of orange juice or vitamin C supplement.  Medicines to make heavy menstrual flow lighter.  Surgery. HOME CARE INSTRUCTIONS   Take iron as directed by your health care provider.  If you cannot tolerate taking iron supplements by mouth, talk to your health care provider about taking them through a vein (intravenously) or an injection into a muscle.  For the best iron absorption, iron supplements should be taken on an empty stomach. If you cannot tolerate them on an empty stomach, you may need to take them with food.  Do not drink milk or take antacids at the same time as your iron supplements. Milk and antacids may interfere with the absorption of iron.  Iron supplements can cause constipation. Make sure to include fiber in your diet to prevent constipation. A stool softener may also be recommended.  Take vitamins as directed by your health care provider.  Eat a diet rich in iron. Foods high in iron include liver, lean beef, whole-grain bread, eggs, dried fruit, and dark green, leafy vegetables. SEEK IMMEDIATE MEDICAL CARE IF:   You faint. If this happens, do not drive. Call your local emergency services (911 in [place] if no other help is available.  You have chest pain.  You feel nauseous or vomit.  You have severe or increased shortness of breath with activity.  You feel weak.  You have  a rapid heartbeat.  You have unexplained sweating.  You become lightheaded when getting up from a chair or bed. MAKE SURE YOU:   Understand these instructions.  Will watch your condition.  Will get help right away if you are not doing well or get worse. Document Released: 04/09/2000 Document Revised: 01/31/2013 Document Reviewed: 12/18/2012 [facility] Patient Information 2014 [place].

## 2013-07-10 NOTE — Progress Notes (Signed)
I spoke with Heme-Onc on call doctor about the patient's questionable anti-phospholipid history. She advised that the patient does not need anti-coagulation with the phospholipid history unless she has had an embolism or DVT in the past. She was concerned about the patient's anemia and leukopenia and suggested getting the following lab test: Hepatitis Panel, B12, folate, HIV, sed rate, LDH, and a peripheral smear. She also suggested getting an ultrasound of her abdomen at some point looking for splenomegaly with her history of sickle cell trait.   Rosemarie Ax, MD PGY-1, [facility] 07/10/2013, 4:24 PM FPTS Service pager: [phone] (text pages welcome through [facility])

## 2013-07-11 LAB — FOLATE: Folate: 12.8 ng/mL

## 2013-07-11 LAB — CARDIOLIPIN ANTIBODIES, IGG, IGM, IGA
ANTICARDIOLIPIN IGG: 11 GPL U/mL — AB (ref <23)
Anticardiolipin IgA: 27 APL U/mL — ABNORMAL HIGH (ref <22)
Anticardiolipin IgM: 9 MPL U/mL — ABNORMAL LOW (ref <11)

## 2013-07-11 LAB — HEPATITIS PANEL, ACUTE
HCV Ab: NEGATIVE
Hep A IgM: NONREACTIVE
Hep B C IgM: NONREACTIVE
Hepatitis B Surface Ag: NEGATIVE

## 2013-07-11 LAB — HIV ANTIBODY (ROUTINE TESTING W REFLEX): HIV: NONREACTIVE

## 2013-07-11 LAB — ANA: ANA: NEGATIVE

## 2013-07-11 LAB — BETA-2-GLYCOPROTEIN I ABS, IGG/M/A
Beta-2 Glyco I IgG: 5 G Units (ref <20)
Beta-2-Glycoprotein I IgA: 8 A Units (ref <20)
Beta-2-Glycoprotein I IgM: 9 M Units (ref <20)

## 2013-07-11 LAB — VITAMIN B12: Vitamin B-12: 603 pg/mL (ref 211–911)

## 2013-07-12 ENCOUNTER — Ambulatory Visit: Payer: Medicaid Other | Admitting: Family Medicine

## 2013-07-12 LAB — PATHOLOGIST SMEAR REVIEW

## 2013-07-13 ENCOUNTER — Ambulatory Visit (INDEPENDENT_AMBULATORY_CARE_PROVIDER_SITE_OTHER): Payer: Medicaid Other | Admitting: Family Medicine

## 2013-07-13 ENCOUNTER — Encounter: Payer: Self-pay | Admitting: Family Medicine

## 2013-07-13 VITALS — BP 116/68 | HR 70 | Temp 97.9°F | Wt 226.0 lb

## 2013-07-13 DIAGNOSIS — I809 Phlebitis and thrombophlebitis of unspecified site: Principal | ICD-10-CM

## 2013-07-13 DIAGNOSIS — T801XXA Vascular complications following infusion, transfusion and therapeutic injection, initial encounter: Secondary | ICD-10-CM | POA: Insufficient documentation

## 2013-07-13 DIAGNOSIS — I998 Other disorder of circulatory system: Secondary | ICD-10-CM

## 2013-07-13 DIAGNOSIS — Y842 Radiological procedure and radiotherapy as the cause of abnormal reaction of the patient, or of later complication, without mention of misadventure at the time of the procedure: Secondary | ICD-10-CM

## 2013-07-13 NOTE — Patient Instructions (Signed)
I think your pain is due to an inflammation of the vein called phlebitis. You should apply warm compresses to the area. You should take aspirin for the pain and to help this condition resolve over the next week or so. It is getting better now, and should resolve completely. If it gets worse or you develop a fever, please contact the office and return for an appointment as this could be a sign of infection.

## 2013-07-13 NOTE — Assessment & Plan Note (Signed)
Clinical diagnosis, improving. Rx: Heat and ASA OTC x7 days. Reviewed RTC criteria for possibility of cellulitis. (No fever or obvious lymphangiitis at this time).

## 2013-07-13 NOTE — Progress Notes (Signed)
Patient ID: Latoya Guzman, female   DOB: 1975-12-23, 38 y.o.   MRN: [id number]   Subjective:  HPI:   Latoya Guzman is a 38 y.o. female with a history of lupus anticoagulant not on anticoagulation here for a bump near her previous IV site.   She was recently discharged after an admission for weakness. She was found to be anemic and given a transfusion of 1u PRBCs and an iron infusion. She had an IV site in the left AC fossa and reports pain and hardening of the area proximal to the site 1 day after its discontinuation. The area reached the size of a golf ball and has subsequently reduced in size. The pain is improving and her ROM is full. She has not tried anything for the pain and denies this ever having happened before.   Review of Systems:  Per HPI. All other systems reviewed and are negative.    Objective:  Physical Exam: BP 116/68  Pulse 70  Temp(Src) 97.9 F (36.6 C) (Oral)  Wt 226 lb (102.513 kg)  LMP 06/17/2013  Gen:  38 y.o. female in NAD HEENT: MMM, EOMI, PERRL, anicteric sclerae CV: RRR, no MRG, no JVD Resp: Non-labored, CTAB, no wheezes noted Abd: Soft, NTND, BS present, no guarding or organomegaly MSK: No edema noted, full ROM Neuro: Alert and oriented, speech normal Left arm: Localized painful induration, warm to palpation, overlying ~2.5cm of the course of the left basilic vein proximal to the [facility] fossa. Overlying erythema extends approximately 5 cm proximally. No lymphangiitic streaking. ROM preserved. Site of former IV unidentifiable.    Assessment:     Latoya Guzman is a 38 y.o. female here for phlebitis    Plan:     See problem list for problem-specific plans.

## 2013-07-16 NOTE — Discharge Summary (Signed)
I agree with the discharge summary as documented.   Saksham Akkerman MD  

## 2013-07-18 NOTE — ED Provider Notes (Signed)
CSN: [id number]     Arrival date & time 07/08/13  2044 History   First MD Initiated Contact with Patient 07/08/13 2145     Chief Complaint  Patient presents with  . Knee Pain  . Otalgia  . Diarrhea  . Headache     (Consider location/radiation/quality/duration/timing/severity/associated sxs/prior Treatment) HPI Comments: Latoya Guzman is a 38 y.o. Female with a history of sickle cell trait, lupus antigen, chronic anemia presenting with multiple complaints, the main being diarrhea which started when she woke this morning.  She reports frequent episodes of nonbloody , watery diarrhea along with left lower quadrant cramping pain which improves after bm's. She has had 10+ episodes of diarrhea.  She has mild nausea but no vomiting She also describes  left earache and headache which she states started when she placed her daughters new hearing aid in this ear last night,  When she pulled it away, she developed a sharp constant ache which has not gone away.  She also describes chronic bilateral knee pain which is not new or different today.  Her 24 year old son currently also has diarrhea.  She denies dysuria, weakness or dizziness.  She has been sleepy this afternoon. She has had no medications prior to arrival for her symptoms.     The history is provided by the patient.    Past Medical History  Diagnosis Date  . Blood transfusion 1998    Post C/S 2units  . Depression     Was on Lexapro Stopped when pregnant  . Preterm labor     PTL last two pregnancies  . Fibroid 03/2010    Noted on [place] in Dec.  . 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 Tricuspid valve regurgitation 02/27/2010  . Lupus Lupus Antigen     associated with pregnancy   . Sickle cell trait   . Anemia 12/19/2012  . Antiphospholipid antibody positive    Past Surgical History  Procedure Laterality Date  . Dilation and curettage of uterus  2005, 2006    1st for  twin loss, 2nd for retained placenta  . Cesarean section  1998, 2009  . Tubal ligation    . Cesarean section  11/14/2010    Procedure: CESAREAN SECTION;  Surgeon: Juliene Pina C. Hulan Fray, MD;  Location: [facility];  Service: Gynecology;  Laterality: N/A;  Repeat cesarean section with delivery of baby boy at 48. Apgars 9/9. Bilateral tubal ligation with filshie clips.    Family History  Problem Relation Age of Onset  . Hypertension Mother   . Lupus Sister   . Diabetes Maternal Uncle   . Cancer Maternal Grandmother    History  Substance Use Topics  . Smoking status: Never Smoker   . Smokeless tobacco: Never Used  . Alcohol Use: Yes     Comment: occ   OB History   Grav Para Term Preterm Abortions TAB SAB Ect Mult Living   10 8 6 2 2  2  1 7      Review of Systems  Constitutional: Positive for chills, appetite change and fatigue. Negative for fever.  HENT: Positive for ear pain. Negative for congestion, ear discharge, hearing loss and sore throat.   Eyes: Negative.   Respiratory: Negative for chest tightness and shortness of breath.   Cardiovascular: Negative for chest pain.  Gastrointestinal: Positive for nausea and diarrhea. Negative for vomiting, abdominal pain and abdominal distention.  Genitourinary: Negative.  Musculoskeletal: Positive for arthralgias. Negative for joint swelling and neck pain.  Skin: Negative.  Negative for rash and wound.  Neurological: Positive for weakness. Negative for dizziness, light-headedness, numbness and headaches.  Psychiatric/Behavioral: Negative.       Allergies  Review of patient's allergies indicates no known allergies.  Home Medications   Current Outpatient Rx  Name  Route  Sig  Dispense  Refill  . ferrous sulfate (FERROUSUL) 325 (65 FE) MG tablet   Oral   Take 1 tablet (325 mg total) by mouth 2 (two) times daily after a meal.   60 tablet   0    BP 114/51  Pulse 71  Temp(Src) 98 F (36.7 C) (Oral)  Resp 18  Ht 5\' 11"  (1.803 m)  Wt 224  lb 10.4 oz (101.9 kg)  BMI 31.35 kg/m2  SpO2 100%  LMP 06/17/2013 Physical Exam  Nursing note and vitals reviewed. Constitutional: She appears well-developed and well-nourished.  HENT:  Head: Normocephalic and atraumatic.  Eyes: Conjunctivae are normal.  Conjunctival pallor  Neck: Normal range of motion.  Cardiovascular: Normal rate, regular rhythm, normal heart sounds and intact distal pulses.   Pulmonary/Chest: Effort normal and breath sounds normal. She has no wheezes.  Abdominal: Soft. Bowel sounds are normal. She exhibits no distension and no mass. There is no tenderness. There is no guarding.  Musculoskeletal: Normal range of motion.  Neurological: She is alert.  Skin: Skin is warm and dry.  Psychiatric: She has a normal mood and affect.    ED Course  Procedures (including critical care time) Labs Review Labs Reviewed  CBC WITH DIFFERENTIAL - Abnormal; Notable for the following:    Hemoglobin 7.1 (*)    HCT 23.8 (*)    MCV 54.6 (*)    MCH 16.3 (*)    MCHC 29.8 (*)    RDW 19.3 (*)    All other components within normal limits  BASIC METABOLIC PANEL - Abnormal; Notable for the following:    GFR calc non Af Amer 84 (*)    All other components within normal limits  IRON AND TIBC - Abnormal; Notable for the following:    Iron 15 (*)    Saturation Ratios 4 (*)    All other components within normal limits  CBC - Abnormal; Notable for the following:    WBC 2.6 (*)    Hemoglobin 7.6 (*)    HCT 25.0 (*)    MCV 57.6 (*)    MCH 17.5 (*)    RDW 22.7 (*)    All other components within normal limits  CARDIOLIPIN ANTIBODIES, IGG, IGM, IGA - Abnormal; Notable for the following:    Anticardiolipin IgG 11 (*)    Anticardiolipin IgM 9 (*)    Anticardiolipin IgA 27 (*)    All other components within normal limits  CBC - Abnormal; Notable for the following:    Hemoglobin 8.1 (*)    HCT 27.3 (*)    MCV 57.8 (*)    MCH 17.2 (*)    MCHC 29.7 (*)    RDW 22.5 (*)    All other  components within normal limits  URINALYSIS, ROUTINE W REFLEX MICROSCOPIC  PREGNANCY, URINE  ANA  BETA-2-GLYCOPROTEIN I ABS, IGG/M/A  VITAMIN B12  FOLATE  HIV ANTIBODY (ROUTINE TESTING)  SEDIMENTATION RATE  PATHOLOGIST SMEAR REVIEW  HEPATITIS PANEL, ACUTE  PREPARE RBC (CROSSMATCH)  TYPE AND SCREEN   Imaging Review No results found.   EKG Interpretation None  MDM   Final diagnoses:  Anemia    At re-exam, labs reviewed with patient including her hgb. She does report having increased fatigue and transient lightheadedness for about the past 2 weeks similar to the symptoms she experienced last August when she last needed to be transfused. At that time it was felt her anemia was a result of her menorrhagia, which she still endorses.  Discussed with Dr Lita Mains and will plan admission for transfusion. Temp admit orders given     Evalee Jefferson, PA-C 07/18/13 1542

## 2013-07-23 ENCOUNTER — Inpatient Hospital Stay: Payer: Medicaid Other | Admitting: Family Medicine

## 2013-07-29 NOTE — ED Provider Notes (Signed)
Medical screening examination/treatment/procedure(s) were performed by non-physician practitioner and as supervising physician I was immediately available for consultation/collaboration.   EKG Interpretation None       Julianne Rice, MD 07/29/13 [phone]

## 2013-08-07 ENCOUNTER — Inpatient Hospital Stay: Payer: Medicaid Other | Admitting: Family Medicine

## 2013-09-05 ENCOUNTER — Encounter (HOSPITAL_COMMUNITY): Payer: Self-pay | Admitting: Emergency Medicine

## 2013-09-05 ENCOUNTER — Emergency Department (HOSPITAL_COMMUNITY)
Admission: EM | Admit: 2013-09-05 | Discharge: 2013-09-05 | Disposition: A | Discharge status: Home / Self Care | Payer: Medicaid Other | Attending: Emergency Medicine | Admitting: Emergency Medicine

## 2013-09-05 DIAGNOSIS — D72829 Elevated white blood cell count, unspecified: Secondary | ICD-10-CM | POA: Insufficient documentation

## 2013-09-05 DIAGNOSIS — Z8751 Personal history of pre-term labor: Secondary | ICD-10-CM | POA: Insufficient documentation

## 2013-09-05 DIAGNOSIS — R509 Fever, unspecified: Secondary | ICD-10-CM | POA: Insufficient documentation

## 2013-09-05 DIAGNOSIS — D649 Anemia, unspecified: Secondary | ICD-10-CM | POA: Insufficient documentation

## 2013-09-05 DIAGNOSIS — Z79899 Other long term (current) drug therapy: Secondary | ICD-10-CM | POA: Insufficient documentation

## 2013-09-05 DIAGNOSIS — Z9851 Tubal ligation status: Secondary | ICD-10-CM | POA: Insufficient documentation

## 2013-09-05 DIAGNOSIS — Z8659 Personal history of other mental and behavioral disorders: Secondary | ICD-10-CM | POA: Insufficient documentation

## 2013-09-05 DIAGNOSIS — Z8739 Personal history of other diseases of the musculoskeletal system and connective tissue: Secondary | ICD-10-CM | POA: Insufficient documentation

## 2013-09-05 DIAGNOSIS — Z8742 Personal history of other diseases of the female genital tract: Secondary | ICD-10-CM | POA: Insufficient documentation

## 2013-09-05 DIAGNOSIS — Z8679 Personal history of other diseases of the circulatory system: Secondary | ICD-10-CM | POA: Insufficient documentation

## 2013-09-05 DIAGNOSIS — Z3202 Encounter for pregnancy test, result negative: Secondary | ICD-10-CM | POA: Insufficient documentation

## 2013-09-05 DIAGNOSIS — N390 Urinary tract infection, site not specified: Secondary | ICD-10-CM | POA: Insufficient documentation

## 2013-09-05 DIAGNOSIS — R112 Nausea with vomiting, unspecified: Secondary | ICD-10-CM | POA: Insufficient documentation

## 2013-09-05 DIAGNOSIS — Z792 Long term (current) use of antibiotics: Secondary | ICD-10-CM | POA: Insufficient documentation

## 2013-09-05 LAB — URINALYSIS, ROUTINE W REFLEX MICROSCOPIC
Bilirubin Urine: NEGATIVE
Glucose, UA: NEGATIVE mg/dL
Hgb urine dipstick: NEGATIVE
Ketones, ur: NEGATIVE mg/dL
NITRITE: NEGATIVE
PROTEIN: 30 mg/dL — AB
Specific Gravity, Urine: 1.023 (ref 1.005–1.030)
Urobilinogen, UA: 1 mg/dL (ref 0.0–1.0)
pH: 8.5 — ABNORMAL HIGH (ref 5.0–8.0)

## 2013-09-05 LAB — CBC WITH DIFFERENTIAL/PLATELET
Basophils Absolute: 0 10*3/uL (ref 0.0–0.1)
Basophils Relative: 0 % (ref 0–1)
Eosinophils Absolute: 0 10*3/uL (ref 0.0–0.7)
Eosinophils Relative: 0 % (ref 0–5)
HEMATOCRIT: 27.8 % — AB (ref 36.0–46.0)
Hemoglobin: 9.2 g/dL — ABNORMAL LOW (ref 12.0–15.0)
Lymphocytes Relative: 6 % — ABNORMAL LOW (ref 12–46)
Lymphs Abs: 0.8 10*3/uL (ref 0.7–4.0)
MCH: 23.3 pg — AB (ref 26.0–34.0)
MCHC: 33.1 g/dL (ref 30.0–36.0)
MCV: 70.4 fL — AB (ref 78.0–100.0)
MONO ABS: 0.4 10*3/uL (ref 0.1–1.0)
MONOS PCT: 3 % (ref 3–12)
NEUTROS ABS: 12 10*3/uL — AB (ref 1.7–7.7)
Neutrophils Relative %: 91 % — ABNORMAL HIGH (ref 43–77)
Platelets: 281 10*3/uL (ref 150–400)
RBC: 3.95 MIL/uL (ref 3.87–5.11)
RDW: 24.5 % — ABNORMAL HIGH (ref 11.5–15.5)
WBC: 13.2 10*3/uL — AB (ref 4.0–10.5)

## 2013-09-05 LAB — COMPREHENSIVE METABOLIC PANEL
ALBUMIN: 4.2 g/dL (ref 3.5–5.2)
ALT: 18 U/L (ref 0–35)
AST: 22 U/L (ref 0–37)
Alkaline Phosphatase: 70 U/L (ref 39–117)
BUN: 10 mg/dL (ref 6–23)
CALCIUM: 9.3 mg/dL (ref 8.4–10.5)
CHLORIDE: 100 meq/L (ref 96–112)
CO2: 22 mEq/L (ref 19–32)
CREATININE: 0.98 mg/dL (ref 0.50–1.10)
GFR calc Af Amer: 86 mL/min — ABNORMAL LOW (ref >90)
GFR, EST NON AFRICAN AMERICAN: 74 mL/min — AB (ref >90)
Glucose, Bld: 120 mg/dL — ABNORMAL HIGH (ref 70–99)
Potassium: 3.8 mEq/L (ref 3.7–5.3)
Sodium: 136 mEq/L — ABNORMAL LOW (ref 137–147)
Total Bilirubin: 0.6 mg/dL (ref 0.3–1.2)
Total Protein: 8 g/dL (ref 6.0–8.3)

## 2013-09-05 LAB — URINE MICROSCOPIC-ADD ON

## 2013-09-05 LAB — PREGNANCY, URINE: PREG TEST UR: NEGATIVE

## 2013-09-05 MED ORDER — CIPROFLOXACIN HCL 500 MG PO TABS: 500.0000 mg | ORAL_TABLET | Freq: Two times a day (BID) | ORAL | Status: DC

## 2013-09-05 MED ORDER — ONDANSETRON 4 MG PO TBDP: 4.0000 mg | ORAL_TABLET | Freq: Three times a day (TID) | ORAL | Status: DC | PRN

## 2013-09-05 MED ORDER — HYDROCODONE-ACETAMINOPHEN 5-325 MG PO TABS: 2.0000 | ORAL_TABLET | ORAL | Status: DC | PRN

## 2013-09-05 MED ORDER — SODIUM CHLORIDE 0.9 % IV BOLUS (SEPSIS)
1000.0000 mL | Freq: Once | INTRAVENOUS | Status: AC
  Administered 2013-09-05: 1000 mL via INTRAVENOUS

## 2013-09-05 MED ORDER — KETOROLAC TROMETHAMINE 30 MG/ML IJ SOLN
30.0000 mg | Freq: Once | INTRAMUSCULAR | Status: AC
  Administered 2013-09-05: 30 mg via INTRAVENOUS
  Filled 2013-09-05: qty 1

## 2013-09-05 MED ORDER — PHENAZOPYRIDINE HCL 200 MG PO TABS: 200.0000 mg | ORAL_TABLET | Freq: Three times a day (TID) | ORAL | Status: DC

## 2013-09-05 MED ORDER — DEXTROSE 5 % IV SOLN
1.0000 g | Freq: Once | INTRAVENOUS | Status: AC
  Administered 2013-09-05: 1 g via INTRAVENOUS
  Filled 2013-09-05: qty 10

## 2013-09-05 MED ORDER — ONDANSETRON HCL 4 MG/2ML IJ SOLN
4.0000 mg | Freq: Once | INTRAMUSCULAR | Status: AC
  Administered 2013-09-05: 4 mg via INTRAVENOUS
  Filled 2013-09-05: qty 2

## 2013-09-05 MED ORDER — MORPHINE SULFATE 4 MG/ML IJ SOLN
4.0000 mg | INTRAMUSCULAR | Status: DC | PRN
  Administered 2013-09-05 (×2): 4 mg via INTRAVENOUS
  Filled 2013-09-05 (×2): qty 1

## 2013-09-05 NOTE — Discharge Instructions (Signed)
Urinary Tract Infection  Urinary tract infections (UTIs) can develop anywhere along your urinary tract. Your urinary tract is your body's drainage system for removing wastes and extra water. Your urinary tract includes two kidneys, two ureters, a bladder, and a urethra. Your kidneys are a pair of bean-shaped organs. Each kidney is about the size of your fist. They are located below your ribs, one on each side of your spine.  CAUSES  Infections are caused by microbes, which are microscopic organisms, including fungi, viruses, and bacteria. These organisms are so small that they can only be seen through a microscope. Bacteria are the microbes that most commonly cause UTIs.  SYMPTOMS   Symptoms of UTIs may vary by age and gender of the patient and by the location of the infection. Symptoms in young women typically include a frequent and intense urge to urinate and a painful, burning feeling in the bladder or urethra during urination. Older women and men are more likely to be tired, shaky, and weak and have muscle aches and abdominal pain. A fever may mean the infection is in your kidneys. Other symptoms of a kidney infection include pain in your back or sides below the ribs, nausea, and vomiting.  DIAGNOSIS  To diagnose a UTI, your caregiver will ask you about your symptoms. Your caregiver also will ask to provide a urine sample. The urine sample will be tested for bacteria and white blood cells. White blood cells are made by your body to help fight infection.  TREATMENT   Typically, UTIs can be treated with medication. Because most UTIs are caused by a bacterial infection, they usually can be treated with the use of antibiotics. The choice of antibiotic and length of treatment depend on your symptoms and the type of bacteria causing your infection.  HOME CARE INSTRUCTIONS   If you were prescribed antibiotics, take them exactly as your caregiver instructs you. Finish the medication even if you feel better after you  have only taken some of the medication.   Drink enough water and fluids to keep your urine clear or pale yellow.   Avoid caffeine, tea, and carbonated beverages. They tend to irritate your bladder.   Empty your bladder often. Avoid holding urine for long periods of time.   Empty your bladder before and after sexual intercourse.   After a bowel movement, women should cleanse from front to back. Use each tissue only once.  SEEK MEDICAL CARE IF:    You have back pain.   You develop a fever.   Your symptoms do not begin to resolve within 3 days.  SEEK IMMEDIATE MEDICAL CARE IF:    You have severe back pain or lower abdominal pain.   You develop chills.   You have nausea or vomiting.   You have continued burning or discomfort with urination.  MAKE SURE YOU:    Understand these instructions.   Will watch your condition.   Will get help right away if you are not doing well or get worse.  Document Released: 01/20/2005 Document Revised: 10/12/2011 Document Reviewed: 05/21/2011  [employer] Patient Information 2014 [employer]

## 2013-09-05 NOTE — ED Provider Notes (Signed)
CSN: [id number]     Arrival date & time 09/05/13  9735 History   First MD Initiated Contact with Patient 09/05/13 1009     Chief Complaint  Patient presents with  . Abdominal Pain      HPI  Patient presents with complaint of suprapubic abdominal pain is bilateral lower quadrant suprapubic. Urinary frequency and hesitancy yesterday. A friend gave her a "blue pain pill". She states it felt better for a few hours. Pain worsened this morning. Had vomiting. Presents here.  Past Medical History  Diagnosis Date  . Blood transfusion 1998    Post C/S 2units  . Depression     Was on Lexapro Stopped when pregnant  . Preterm labor     PTL last two pregnancies  . Fibroid 03/2010    Noted on [place] in Dec.  . 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 Tricuspid valve regurgitation 02/27/2010  . Lupus Lupus Antigen     associated with pregnancy   . Sickle cell trait   . Anemia 12/19/2012  . Antiphospholipid antibody positive    Past Surgical History  Procedure Laterality Date  . Dilation and curettage of uterus  2005, 2006    1st for twin loss, 2nd for retained placenta  . Cesarean section  1998, 2009  . Tubal ligation    . Cesarean section  11/14/2010    Procedure: CESAREAN SECTION;  Surgeon: Juliene Pina C. Hulan Fray, MD;  Location: [facility];  Service: Gynecology;  Laterality: N/A;  Repeat cesarean section with delivery of baby boy at 58. Apgars 9/9. Bilateral tubal ligation with filshie clips.    Family History  Problem Relation Age of Onset  . Hypertension Mother   . Lupus Sister   . Diabetes Maternal Uncle   . Cancer Maternal Grandmother    History  Substance Use Topics  . Smoking status: Never Smoker   . Smokeless tobacco: Never Used  . Alcohol Use: Yes     Comment: occ   OB History   Grav Para Term Preterm Abortions TAB SAB Ect Mult Living   10 8 6 2 2  2  1 7      Review of Systems  Constitutional: Negative for fever, chills,  diaphoresis, appetite change and fatigue.  HENT: Negative for mouth sores, sore throat and trouble swallowing.   Eyes: Negative for visual disturbance.  Respiratory: Negative for cough, chest tightness, shortness of breath and wheezing.   Cardiovascular: Negative for chest pain.  Gastrointestinal: Positive for nausea and abdominal pain. Negative for vomiting, diarrhea and abdominal distention.  Endocrine: Negative for polydipsia, polyphagia and polyuria.  Genitourinary: Positive for dysuria, urgency, frequency and flank pain. Negative for hematuria, vaginal bleeding, vaginal discharge and vaginal pain.  Musculoskeletal: Negative for gait problem.  Skin: Negative for color change, pallor and rash.  Neurological: Negative for dizziness, syncope, light-headedness and headaches.  Hematological: Does not bruise/bleed easily.  Psychiatric/Behavioral: Negative for behavioral problems and confusion.      Allergies  Review of patient's allergies indicates no known allergies.  Home Medications   Prior to Admission medications   Medication Sig Start Date End Date Taking? Authorizing Provider  ferrous sulfate (FERROUSUL) 325 (65 FE) MG tablet Take 1 tablet (325 mg total) by mouth 2 (two) times daily after a meal. 07/10/13  Yes Rosemarie Ax, MD  ciprofloxacin (CIPRO) 500 MG tablet Take 1 tablet (500 mg total) by mouth every 12 (twelve) hours. 09/05/13  Tanna Furry, MD  HYDROcodone-acetaminophen (NORCO/VICODIN) 5-325 MG per tablet Take 2 tablets by mouth every 4 (four) hours as needed. 09/05/13   Tanna Furry, MD  ondansetron (ZOFRAN ODT) 4 MG disintegrating tablet Take 1 tablet (4 mg total) by mouth every 8 (eight) hours as needed for nausea. 09/05/13   Tanna Furry, MD  phenazopyridine (PYRIDIUM) 200 MG tablet Take 1 tablet (200 mg total) by mouth 3 (three) times daily. 09/05/13   Tanna Furry, MD   BP 129/70  Pulse 108  Temp(Src) 100.2 F (37.9 C) (Oral)  Resp 18  Ht 5\' 11"  (1.803 m)  Wt 226 lb  (102.513 kg)  BMI 31.53 kg/m2  SpO2 100%  LMP 08/28/2013 Physical Exam  Constitutional: She is oriented to person, place, and time. She appears well-developed and well-nourished. No distress.  HENT:  Head: Normocephalic.  Eyes: Conjunctivae are normal. Pupils are equal, round, and reactive to light. No scleral icterus.  Neck: Normal range of motion. Neck supple. No thyromegaly present.  Cardiovascular: Normal rate and regular rhythm.  Exam reveals no gallop and no friction rub.   No murmur heard. Pulmonary/Chest: Effort normal and breath sounds normal. No respiratory distress. She has no wheezes. She has no rales.  Abdominal: Soft. Bowel sounds are normal. She exhibits no distension. There is no tenderness. There is no rebound.    Suprapubic abdominal tenderness. No peritoneal irritation or lateralizing pain. Not tender over the right lower quadrant. No peritoneal irritation.  Musculoskeletal: Normal range of motion.       Back:  Neurological: She is alert and oriented to person, place, and time.  Skin: Skin is warm and dry. No rash noted.  Psychiatric: She has a normal mood and affect. Her behavior is normal.    ED Course  Procedures (including critical care time) Labs Review Labs Reviewed  CBC WITH DIFFERENTIAL - Abnormal; Notable for the following:    WBC 13.2 (*)    Hemoglobin 9.2 (*)    HCT 27.8 (*)    MCV 70.4 (*)    MCH 23.3 (*)    RDW 24.5 (*)    Neutrophils Relative % 91 (*)    Lymphocytes Relative 6 (*)    Neutro Abs 12.0 (*)    All other components within normal limits  COMPREHENSIVE METABOLIC PANEL - Abnormal; Notable for the following:    Sodium 136 (*)    Glucose, Bld 120 (*)    GFR calc non Af Amer 74 (*)    GFR calc Af Amer 86 (*)    All other components within normal limits  URINALYSIS, ROUTINE W REFLEX MICROSCOPIC - Abnormal; Notable for the following:    APPearance CLOUDY (*)    pH 8.5 (*)    Protein, ur 30 (*)    Leukocytes, UA MODERATE (*)     All other components within normal limits  URINE MICROSCOPIC-ADD ON - Abnormal; Notable for the following:    Squamous Epithelial / LPF FEW (*)    Bacteria, UA FEW (*)    All other components within normal limits  PREGNANCY, URINE    Imaging Review No results found.   EKG Interpretation None      MDM   Final diagnoses:  Urinary tract infection    Urine appears infected. Pregnancy is negative. Leukocytosis. Low-grade fever. She is given IV fluids, Toradol, Zofran, morphine, Rocephin. Discharged home with this plan Cipro, Vicodin, Zofran, Pyridium, hydration. Recheck primary care.    Tanna Furry, MD 09/05/13 [phone]

## 2013-09-05 NOTE — ED Notes (Signed)
Onset 3 days ago LLQ and RLQ abdominal pain with dysuria and not able to urinate a lot per patient.  Pain 10/10 achy sharp.

## 2013-09-14 ENCOUNTER — Ambulatory Visit (INDEPENDENT_AMBULATORY_CARE_PROVIDER_SITE_OTHER): Payer: Medicaid Other | Admitting: Sports Medicine

## 2013-09-14 ENCOUNTER — Encounter: Payer: Self-pay | Admitting: Sports Medicine

## 2013-09-14 ENCOUNTER — Other Ambulatory Visit (HOSPITAL_COMMUNITY)
Admission: RE | Admit: 2013-09-14 | Discharge: 2013-09-14 | Disposition: A | Discharge status: Home / Self Care | Payer: Medicaid Other | Source: Ambulatory Visit | Attending: Sports Medicine | Admitting: Sports Medicine

## 2013-09-14 VITALS — BP 117/56 | HR 88 | Temp 98.2°F | Ht 71.0 in | Wt 219.0 lb

## 2013-09-14 DIAGNOSIS — R894 Abnormal immunological findings in specimens from other organs, systems and tissues: Secondary | ICD-10-CM

## 2013-09-14 DIAGNOSIS — B373 Candidiasis of vulva and vagina: Secondary | ICD-10-CM | POA: Insufficient documentation

## 2013-09-14 DIAGNOSIS — Z113 Encounter for screening for infections with a predominantly sexual mode of transmission: Secondary | ICD-10-CM | POA: Insufficient documentation

## 2013-09-14 DIAGNOSIS — N898 Other specified noninflammatory disorders of vagina: Secondary | ICD-10-CM

## 2013-09-14 DIAGNOSIS — B3731 Acute candidiasis of vulva and vagina: Secondary | ICD-10-CM | POA: Insufficient documentation

## 2013-09-14 DIAGNOSIS — Z01419 Encounter for gynecological examination (general) (routine) without abnormal findings: Secondary | ICD-10-CM | POA: Insufficient documentation

## 2013-09-14 DIAGNOSIS — Z299 Encounter for prophylactic measures, unspecified: Secondary | ICD-10-CM

## 2013-09-14 DIAGNOSIS — Z0000 Encounter for general adult medical examination without abnormal findings: Secondary | ICD-10-CM | POA: Insufficient documentation

## 2013-09-14 DIAGNOSIS — R760 Raised antibody titer: Secondary | ICD-10-CM

## 2013-09-14 DIAGNOSIS — Z124 Encounter for screening for malignant neoplasm of cervix: Secondary | ICD-10-CM

## 2013-09-14 DIAGNOSIS — Z1151 Encounter for screening for human papillomavirus (HPV): Secondary | ICD-10-CM | POA: Insufficient documentation

## 2013-09-14 DIAGNOSIS — N760 Acute vaginitis: Secondary | ICD-10-CM

## 2013-09-14 LAB — POCT WET PREP (WET MOUNT): CLUE CELLS WET PREP WHIFF POC: NEGATIVE

## 2013-09-14 MED ORDER — CLOTRIMAZOLE 1 % EX CREA: 1.0000 "application " | TOPICAL_CREAM | Freq: Two times a day (BID) | CUTANEOUS | Status: DC | PRN

## 2013-09-14 MED ORDER — FLUCONAZOLE 150 MG PO TABS: 150.0000 mg | ORAL_TABLET | Freq: Once | ORAL | Status: DC

## 2013-09-14 NOTE — Assessment & Plan Note (Signed)
She had labs recently while in the hospital for anemia.  She has has not followed up for this issue and plans to schedule with her PCP.  She did have anti-cardiolipin antibodies come back elevated which is consistent with prior findings.  I will defer any anticoagulation treatment at this time to further discussions with her PCP and potentially followup with a hematologist. - There does appear to be a visit with a hematologist in 2011 but no notes are found.

## 2013-09-14 NOTE — Patient Instructions (Addendum)
Please followup with Dr. Raeford Razor in the next 1-2 months  Use the yeast medicine as directed. Use the cream twice per day for the next 3 days then as needed

## 2013-09-14 NOTE — Assessment & Plan Note (Signed)
Acute condition  - slight desquamation of the perineal skin likely reflective of cutaneous candidal involvement.  Rectal pain is likely due to desquamation that should respond to antifungal treatment 1. Topical and oral treatment  > If no improvement patient may need a perineal biopsy

## 2013-09-14 NOTE — Assessment & Plan Note (Signed)
PAP smear today Risk labs today F/u with Rosemarie Ax, MD for review of results

## 2013-09-14 NOTE — Progress Notes (Signed)
  Latoya Guzman - 38 y.o. female MRN [id number]  Date of birth: 16-Feb-1976  CC, SUBJECTIVE & ROS:     If applicable, see problem based charting for additional problem specific documentation. HPI Chief Complaint  Patient presents with  . Rectal Pain    2-3X; pain from UTI better  . Vaginal Discharge    no fevers chills   Patient reports rectal pain began approximately 3 days ago which was toward the end of her antibiotic course for her urinary tract infection.  She reports overall her abdominal pain and suprapubic pain with dysuria is improved.  She is having significant sharp shooting rectal pain and noticing sloughing of her skin in the vaginal rectal area.  Pt denies any fevers, chills, or rigors.  She has had significant vaginal discharge.  No recent history of STI but she is interested in getting checked.  She does have a history of abnormal Pap smear status post biopsy but has not had followup in quite some time.  She denies any chest pain, shortness breath, unilateral lower extremity swelling.  HISTORY: Past Medical, Surgical, Social, and Family History Reviewed & Updated per EMR.  Pertinent Historical Findings include: Antiphospholipid syndrome, history of anemia, recently seen in the emergency department for urinary tract infection.  Has not had a wellness visit in quite some time.  OBJECTIVE:  BP:117/56 mmHg  HR:88bpm  TEMP:98.2 F (36.8 C)(Oral)  RESP:   HT:5\' 11"  (180.3 cm)   WT:219 lb (99.338 kg)  BMI:30.6 Physical Exam  Constitutional: She is well-developed, well-nourished, and in no distress. No distress.  HENT:  Head: Normocephalic and atraumatic.  Eyes: Right eye exhibits no discharge. Left eye exhibits no discharge. No scleral icterus.  Pulmonary/Chest: Effort normal. No respiratory distress.  Skin: She is not diaphoretic.  Psychiatric: Mood, memory, affect and judgment normal.   Chaperone: CMA - Jo  External:  significant desquamation of the perineal skin,  normal appearing genitalia, minimal, thick white discharge  Vagina: no vaginal lesion, vaginal mucosa moist, GERD-like discharge   Cervix:  12:00 biopsy scar, no other lesions, minimally friable, no CMT  Uterus: normal size, shape, consistency  non-tender  Aden[place]a: no masses, non-tender  TESTS:   GC, chlamydia, wet prep, Pap smear    MEDICATIONS, LABS & OTHER ORDERS: Previous Medications   FERROUS SULFATE (FERROUSUL) 325 (65 FE) MG TABLET    Take 1 tablet (325 mg total) by mouth 2 (two) times daily after a meal.   Modified Medications   No medications on file   New Prescriptions   CLOTRIMAZOLE (LOTRIMIN) 1 % CREAM    Apply 1 application topically 2 (two) times daily as needed.   FLUCONAZOLE (DIFLUCAN) 150 MG TABLET    Take 1 tablet (150 mg total) by mouth once. Repeat in 3 days   Orders Placed This Encounter  Procedures  . Comprehensive metabolic panel  . CBC  . Lipid panel  . TSH  . HIV antibody  . RPR  . POCT Wet Prep [facility])   ASSESSMENT & PLAN: See problem based charting & AVS for pt instructions.

## 2013-09-15 LAB — TSH: TSH: 1.727 u[IU]/mL (ref 0.350–4.500)

## 2013-09-15 LAB — LIPID PANEL
Cholesterol: 125 mg/dL (ref 0–200)
HDL: 30 mg/dL — AB (ref >39)
LDL Cholesterol: 79 mg/dL (ref 0–99)
Total CHOL/HDL Ratio: 4.2 Ratio
Triglycerides: 82 mg/dL (ref <150)
VLDL: 16 mg/dL (ref 0–40)

## 2013-09-15 LAB — COMPREHENSIVE METABOLIC PANEL
ALK PHOS: 57 U/L (ref 39–117)
ALT: 23 U/L (ref 0–35)
AST: 23 U/L (ref 0–37)
Albumin: 3.6 g/dL (ref 3.5–5.2)
BUN: 13 mg/dL (ref 6–23)
CO2: 26 mEq/L (ref 19–32)
CREATININE: 0.87 mg/dL (ref 0.50–1.10)
Calcium: 9.2 mg/dL (ref 8.4–10.5)
Chloride: 104 mEq/L (ref 96–112)
Glucose, Bld: 88 mg/dL (ref 70–99)
Potassium: 4.1 mEq/L (ref 3.5–5.3)
Sodium: 139 mEq/L (ref 135–145)
Total Bilirubin: 0.2 mg/dL (ref 0.2–1.2)
Total Protein: 7.3 g/dL (ref 6.0–8.3)

## 2013-09-15 LAB — CBC
HCT: 26.8 % — ABNORMAL LOW (ref 36.0–46.0)
Hemoglobin: 8.6 g/dL — ABNORMAL LOW (ref 12.0–15.0)
MCH: 22.4 pg — ABNORMAL LOW (ref 26.0–34.0)
MCHC: 32.1 g/dL (ref 30.0–36.0)
MCV: 69.8 fL — AB (ref 78.0–100.0)
Platelets: 503 10*3/uL — ABNORMAL HIGH (ref 150–400)
RBC: 3.84 MIL/uL — ABNORMAL LOW (ref 3.87–5.11)
RDW: 24 % — ABNORMAL HIGH (ref 11.5–15.5)
WBC: 5.4 10*3/uL (ref 4.0–10.5)

## 2013-09-15 LAB — RPR

## 2013-09-15 LAB — HIV ANTIBODY (ROUTINE TESTING W REFLEX): HIV 1&2 Ab, 4th Generation: NONREACTIVE

## 2013-10-20 ENCOUNTER — Emergency Department (INDEPENDENT_AMBULATORY_CARE_PROVIDER_SITE_OTHER)
Admission: EM | Admit: 2013-10-20 | Discharge: 2013-10-20 | Disposition: A | Discharge status: Home / Self Care | Payer: Medicaid Other | Source: Home / Self Care | Attending: Family Medicine | Admitting: Family Medicine

## 2013-10-20 ENCOUNTER — Encounter (HOSPITAL_COMMUNITY): Payer: Self-pay | Admitting: Emergency Medicine

## 2013-10-20 DIAGNOSIS — A493 Mycoplasma infection, unspecified site: Secondary | ICD-10-CM

## 2013-10-20 DIAGNOSIS — J200 Acute bronchitis due to Mycoplasma pneumoniae: Secondary | ICD-10-CM

## 2013-10-20 DIAGNOSIS — J209 Acute bronchitis, unspecified: Secondary | ICD-10-CM

## 2013-10-20 LAB — POCT PREGNANCY, URINE: PREG TEST UR: NEGATIVE

## 2013-10-20 MED ORDER — MINOCYCLINE HCL 100 MG PO CAPS: 100.0000 mg | ORAL_CAPSULE | Freq: Two times a day (BID) | ORAL | Status: DC

## 2013-10-20 NOTE — ED Provider Notes (Signed)
CSN: [id number]     Arrival date & time 10/20/13  1241 History   First MD Initiated Contact with Patient 10/20/13 1323     Chief Complaint  Patient presents with  . Cough  . Abdominal Pain   (Consider location/radiation/quality/duration/timing/severity/associated sxs/prior Treatment) Patient is a 38 y.o. female presenting with cough. The history is provided by the patient.  Cough Cough characteristics:  Productive Sputum characteristics:  Green Severity:  Mild Onset quality:  Gradual Duration:  1 week Progression:  Unchanged Chronicity:  New Smoker: no   Context: smoke exposure   Relieved by:  None tried Worsened by:  Nothing tried Ineffective treatments:  None tried Associated symptoms: no rhinorrhea and no wheezing     Past Medical History  Diagnosis Date  . Blood transfusion 1998    Post C/S 2units  . Depression     Was on Lexapro Stopped when pregnant  . Preterm labor     PTL last two pregnancies  . Fibroid 03/2010    Noted on [place] in Dec.  . 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 Tricuspid valve regurgitation 02/27/2010  . Lupus Lupus Antigen     associated with pregnancy   . Sickle cell trait   . Anemia 12/19/2012  . Antiphospholipid antibody positive    Past Surgical History  Procedure Laterality Date  . Dilation and curettage of uterus  2005, 2006    1st for twin loss, 2nd for retained placenta  . Cesarean section  1998, 2009  . Tubal ligation    . Cesarean section  11/14/2010    Procedure: CESAREAN SECTION;  Surgeon: Juliene Pina C. Hulan Fray, MD;  Location: [facility];  Service: Gynecology;  Laterality: N/A;  Repeat cesarean section with delivery of baby boy at 53. Apgars 9/9. Bilateral tubal ligation with filshie clips.    Family History  Problem Relation Age of Onset  . Hypertension Mother   . Lupus Sister   . Diabetes Maternal Uncle   . Cancer Maternal Grandmother    History  Substance Use Topics  .  Smoking status: Never Smoker   . Smokeless tobacco: Never Used  . Alcohol Use: Yes     Comment: occ   OB History   Grav Para Term Preterm Abortions TAB SAB Ect Mult Living   10 8 6 2 2  2  1 7      Review of Systems  Constitutional: Negative.   HENT: Negative for congestion, postnasal drip and rhinorrhea.   Respiratory: Positive for cough. Negative for wheezing.   Cardiovascular: Negative.   Gastrointestinal: Positive for abdominal distention.  Genitourinary: Negative.     Allergies  Review of patient's allergies indicates no known allergies.  Home Medications   Prior to Admission medications   Medication Sig Start Date End Date Taking? Authorizing Provider  clotrimazole (LOTRIMIN) 1 % cream Apply 1 application topically 2 (two) times daily as needed. 09/14/13   Gerda Diss, DO  ferrous sulfate (FERROUSUL) 325 (65 FE) MG tablet Take 1 tablet (325 mg total) by mouth 2 (two) times daily after a meal. 07/10/13   Rosemarie Ax, MD  fluconazole (DIFLUCAN) 150 MG tablet Take 1 tablet (150 mg total) by mouth once. Repeat in 3 days 09/14/13   Gerda Diss, DO  minocycline (MINOCIN,DYNACIN) 100 MG capsule Take 1 capsule (100 mg total) by mouth 2 (two) times daily. 10/20/13   Billy Fischer, MD   BP  107/67  Pulse 67  Temp(Src) 97.2 F (36.2 C) (Oral)  Resp 20  SpO2 100%  LMP 09/24/2013 Physical Exam  Nursing note and vitals reviewed. Constitutional: She is oriented to person, place, and time. She appears well-developed and well-nourished. No distress.  HENT:  Head: Normocephalic.  Right Ear: External ear normal.  Left Ear: External ear normal.  Nose: Nose normal.  Mouth/Throat: Oropharynx is clear and moist.  Eyes: Conjunctivae are normal. Pupils are equal, round, and reactive to light.  Neck: Normal range of motion. Neck supple.  Cardiovascular: Regular rhythm and normal heart sounds.   Pulmonary/Chest: Effort normal and breath sounds normal. She exhibits no tenderness.   Abdominal: Soft. Bowel sounds are normal. She exhibits distension. There is no tenderness.  Lymphadenopathy:    She has no cervical adenopathy.  Neurological: She is alert and oriented to person, place, and time.  Skin: Skin is warm and dry.    ED Course  Procedures (including critical care time) Labs Review Labs Reviewed  POCT PREGNANCY, URINE   upreg neg  Imaging Review No results found.   MDM   1. Acute bronchitis due to Mycoplasma pneumoniae        Billy Fischer, MD 10/20/13 1356

## 2013-10-20 NOTE — ED Notes (Signed)
C/o  Productive cough with green sputum.  C/o  epigastric pain with bloating.  Mild constipation.  Pain is felt worse with coughing.  Symptoms present x 1 wk.  No relief with otc meds.

## 2013-11-14 ENCOUNTER — Encounter: Payer: Self-pay | Admitting: Cardiology

## 2014-02-25 ENCOUNTER — Encounter: Payer: Self-pay | Admitting: Cardiology

## 2014-06-10 ENCOUNTER — Ambulatory Visit: Payer: Medicaid Other | Admitting: Family Medicine

## 2014-06-14 ENCOUNTER — Encounter: Payer: Medicaid Other | Admitting: Family Medicine

## 2014-06-30 ENCOUNTER — Encounter (HOSPITAL_COMMUNITY): Payer: Self-pay | Admitting: Emergency Medicine

## 2014-06-30 ENCOUNTER — Emergency Department (HOSPITAL_COMMUNITY)
Admission: EM | Admit: 2014-06-30 | Discharge: 2014-06-30 | Disposition: A | Discharge status: Home / Self Care | Payer: No Typology Code available for payment source | Attending: Emergency Medicine | Admitting: Emergency Medicine

## 2014-06-30 ENCOUNTER — Emergency Department (HOSPITAL_COMMUNITY): Payer: No Typology Code available for payment source

## 2014-06-30 DIAGNOSIS — Y9389 Activity, other specified: Secondary | ICD-10-CM | POA: Insufficient documentation

## 2014-06-30 DIAGNOSIS — Z8659 Personal history of other mental and behavioral disorders: Secondary | ICD-10-CM | POA: Insufficient documentation

## 2014-06-30 DIAGNOSIS — Z8751 Personal history of pre-term labor: Secondary | ICD-10-CM | POA: Insufficient documentation

## 2014-06-30 DIAGNOSIS — D649 Anemia, unspecified: Secondary | ICD-10-CM | POA: Insufficient documentation

## 2014-06-30 DIAGNOSIS — Z8739 Personal history of other diseases of the musculoskeletal system and connective tissue: Secondary | ICD-10-CM | POA: Insufficient documentation

## 2014-06-30 DIAGNOSIS — Z8742 Personal history of other diseases of the female genital tract: Secondary | ICD-10-CM | POA: Diagnosis not present

## 2014-06-30 DIAGNOSIS — Z79899 Other long term (current) drug therapy: Secondary | ICD-10-CM | POA: Diagnosis not present

## 2014-06-30 DIAGNOSIS — Z8774 Personal history of (corrected) congenital malformations of heart and circulatory system: Secondary | ICD-10-CM | POA: Diagnosis not present

## 2014-06-30 DIAGNOSIS — S199XXA Unspecified injury of neck, initial encounter: Secondary | ICD-10-CM | POA: Diagnosis present

## 2014-06-30 DIAGNOSIS — Z8679 Personal history of other diseases of the circulatory system: Secondary | ICD-10-CM | POA: Insufficient documentation

## 2014-06-30 DIAGNOSIS — Y92410 Unspecified street and highway as the place of occurrence of the external cause: Secondary | ICD-10-CM | POA: Diagnosis not present

## 2014-06-30 DIAGNOSIS — Y998 Other external cause status: Secondary | ICD-10-CM | POA: Diagnosis not present

## 2014-06-30 DIAGNOSIS — S161XXA Strain of muscle, fascia and tendon at neck level, initial encounter: Secondary | ICD-10-CM | POA: Diagnosis not present

## 2014-06-30 MED ORDER — CYCLOBENZAPRINE HCL 10 MG PO TABS
10.0000 mg | ORAL_TABLET | Freq: Once | ORAL | Status: AC
  Administered 2014-06-30: 10 mg via ORAL
  Filled 2014-06-30: qty 1

## 2014-06-30 MED ORDER — IBUPROFEN 800 MG PO TABS: 800.0000 mg | ORAL_TABLET | Freq: Three times a day (TID) | ORAL | Status: DC

## 2014-06-30 MED ORDER — IBUPROFEN 800 MG PO TABS
800.0000 mg | ORAL_TABLET | Freq: Once | ORAL | Status: AC
  Administered 2014-06-30: 800 mg via ORAL
  Filled 2014-06-30: qty 1

## 2014-06-30 MED ORDER — CYCLOBENZAPRINE HCL 10 MG PO TABS: 10.0000 mg | ORAL_TABLET | Freq: Two times a day (BID) | ORAL | Status: DC | PRN
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
## 2014-06-30 NOTE — ED Notes (Signed)
Patient ambulated to restroom without assistance. Requested to provide specimen.

## 2014-06-30 NOTE — Discharge Instructions (Signed)
Cryotherapy Cryotherapy is when you put ice on your injury. Ice helps lessen pain and puffiness (swelling) after an injury. Ice works the best when you start using it in the first 24 to 48 hours after an injury. HOME CARE  Put a dry or damp towel between the ice pack and your skin.  You may press gently on the ice pack.  Leave the ice on for no more than 10 to 20 minutes at a time.  Check your skin after 5 minutes to make sure your skin is okay.  Rest at least 20 minutes between ice pack uses.  Stop using ice when your skin loses feeling (numbness).  Do not use ice on someone who cannot tell you when it hurts. This includes small children and people with memory problems (dementia). GET HELP RIGHT AWAY IF:  You have white spots on your skin.  Your skin turns blue or pale.  Your skin feels waxy or hard.  Your puffiness gets worse. MAKE SURE YOU:   Understand these instructions.  Will watch your condition.  Will get help right away if you are not doing well or get worse. Document Released: 09/29/2007 Document Revised: 07/05/2011 Document Reviewed: 12/03/2010 [facility] Patient Information 2015 [place], [place]. This information is not intended to replace advice given to you by your health care provider. Make sure you discuss any questions you have with your health care provider.  Cervical Sprain A cervical sprain is an injury in the neck in which the strong, fibrous tissues (ligaments) that connect your neck bones stretch or tear. Cervical sprains can range from mild to severe. Severe cervical sprains can cause the neck vertebrae to be unstable. This can lead to damage of the spinal cord and can result in serious nervous system problems. The amount of time it takes for a cervical sprain to get better depends on the cause and extent of the injury. Most cervical sprains heal in 1 to 3 weeks. CAUSES  Severe cervical sprains may be caused by:   Contact sport injuries (such as from  football, rugby, wrestling, hockey, auto racing, gymnastics, diving, martial arts, or boxing).   Motor vehicle collisions.   Whiplash injuries. This is an injury from a sudden forward and backward whipping movement of the head and neck.  Falls.  Mild cervical sprains may be caused by:   Being in an awkward position, such as while cradling a telephone between your ear and shoulder.   Sitting in a chair that does not offer proper support.   Working at a poorly Landscape architect station.   Looking up or down for long periods of time.  SYMPTOMS   Pain, soreness, stiffness, or a burning sensation in the front, back, or sides of the neck. This discomfort may develop immediately after the injury or slowly, 24 hours or more after the injury.   Pain or tenderness directly in the middle of the back of the neck.   Shoulder or upper back pain.   Limited ability to move the neck.   Headache.   Dizziness.   Weakness, numbness, or tingling in the hands or arms.   Muscle spasms.   Difficulty swallowing or chewing.   Tenderness and swelling of the neck.  DIAGNOSIS  Most of the time your health care provider can diagnose a cervical sprain by taking your history and doing a physical exam. Your health care provider will ask about previous neck injuries and any known neck problems, such as arthritis in the neck.  X-rays may be taken to find out if there are any other problems, such as with the bones of the neck. Other tests, such as a CT scan or MRI, may also be needed.  TREATMENT  Treatment depends on the severity of the cervical sprain. Mild sprains can be treated with rest, keeping the neck in place (immobilization), and pain medicines. Severe cervical sprains are immediately immobilized. Further treatment is done to help with pain, muscle spasms, and other symptoms and may include:  Medicines, such as pain relievers, numbing medicines, or muscle relaxants.   Physical  therapy. This may involve stretching exercises, strengthening exercises, and posture training. Exercises and improved posture can help stabilize the neck, strengthen muscles, and help stop symptoms from returning.  HOME CARE INSTRUCTIONS   Put ice on the injured area.   Put ice in a plastic bag.   Place a towel between your skin and the bag.   Leave the ice on for 15-20 minutes, 3-4 times a day.   If your injury was severe, you may have been given a cervical collar to wear. A cervical collar is a two-piece collar designed to keep your neck from moving while it heals.  Do not remove the collar unless instructed by your health care provider.  If you have long hair, keep it outside of the collar.  Ask your health care provider before making any adjustments to your collar. Minor adjustments may be required over time to improve comfort and reduce pressure on your chin or on the back of your head.  Ifyou are allowed to remove the collar for cleaning or bathing, follow your health care provider's instructions on how to do so safely.  Keep your collar clean by wiping it with mild soap and water and drying it completely. If the collar you have been given includes removable pads, remove them every 1-2 days and hand wash them with soap and water. Allow them to air dry. They should be completely dry before you wear them in the collar.  If you are allowed to remove the collar for cleaning and bathing, wash and dry the skin of your neck. Check your skin for irritation or sores. If you see any, tell your health care provider.  Do not drive while wearing the collar.   Only take over-the-counter or prescription medicines for pain, discomfort, or fever as directed by your health care provider.   Keep all follow-up appointments as directed by your health care provider.   Keep all physical therapy appointments as directed by your health care provider.   Make any needed adjustments to your  workstation to promote good posture.   Avoid positions and activities that make your symptoms worse.   Warm up and stretch before being active to help prevent problems.  SEEK MEDICAL CARE IF:   Your pain is not controlled with medicine.   You are unable to decrease your pain medicine over time as planned.   Your activity level is not improving as expected.  SEEK IMMEDIATE MEDICAL CARE IF:   You develop any bleeding.  You develop stomach upset.  You have signs of an allergic reaction to your medicine.   Your symptoms get worse.   You develop new, unexplained symptoms.   You have numbness, tingling, weakness, or paralysis in any part of your body.  MAKE SURE YOU:   Understand these instructions.  Will watch your condition.  Will get help right away if you are not doing well or get worse.  Document Released: 02/07/2007 Document Revised: 04/17/2013 Document Reviewed: 10/18/2012 [facility] Patient Information 2015 [place], [place]. This information is not intended to replace advice given to you by your health care provider. Make sure you discuss any questions you have with your health care provider. Motor Vehicle Collision It is common to have multiple bruises and sore muscles after a motor vehicle collision (MVC). These tend to feel worse for the first 24 hours. You may have the most stiffness and soreness over the first several hours. You may also feel worse when you wake up the first morning after your collision. After this point, you will usually begin to improve with each day. The speed of improvement often depends on the severity of the collision, the number of injuries, and the location and nature of these injuries. HOME CARE INSTRUCTIONS  Put ice on the injured area.  Put ice in a plastic bag.  Place a towel between your skin and the bag.  Leave the ice on for 15-20 minutes, 3-4 times a day, or as directed by your health care provider.  Drink enough fluids to  keep your urine clear or pale yellow. Do not drink alcohol.  Take a warm shower or bath once or twice a day. This will increase blood flow to sore muscles.  You may return to activities as directed by your caregiver. Be careful when lifting, as this may aggravate neck or back pain.  Only take over-the-counter or prescription medicines for pain, discomfort, or fever as directed by your caregiver. Do not use aspirin. This may increase bruising and bleeding. SEEK IMMEDIATE MEDICAL CARE IF:  You have numbness, tingling, or weakness in the arms or legs.  You develop severe headaches not relieved with medicine.  You have severe neck pain, especially tenderness in the middle of the back of your neck.  You have changes in bowel or bladder control.  There is increasing pain in any area of the body.  You have shortness of breath, light-headedness, dizziness, or fainting.  You have chest pain.  You feel sick to your stomach (nauseous), throw up (vomit), or sweat.  You have increasing abdominal discomfort.  There is blood in your urine, stool, or vomit.  You have pain in your shoulder (shoulder strap areas).  You feel your symptoms are getting worse. MAKE SURE YOU:  Understand these instructions.  Will watch your condition.  Will get help right away if you are not doing well or get worse. Document Released: 04/12/2005 Document Revised: 08/27/2013 Document Reviewed: 09/09/2010 [facility] Patient Information 2015 [place], [place]. This information is not intended to replace advice given to you by your health care provider. Make sure you discuss any questions you have with your health care provider.
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
## 2014-06-30 NOTE — ED Notes (Signed)
Awake. Verbally responsive. A/O x4. Resp even and unlabored. No audible adventitious breath sounds noted. ABC's intact.  

## 2014-06-30 NOTE — ED Notes (Signed)
Awake. Verbally responsive. A/O x4. Resp even and unlabored. No audible adventitious breath sounds noted. ABC's intact. Pt ambulated to BR to obtain urine specimen.

## 2014-07-16 ENCOUNTER — Emergency Department (HOSPITAL_COMMUNITY)
Admission: EM | Admit: 2014-07-16 | Discharge: 2014-07-16 | Disposition: A | Discharge status: Home / Self Care | Payer: Self-pay | Source: Home / Self Care

## 2014-07-17 ENCOUNTER — Encounter (HOSPITAL_COMMUNITY): Payer: Self-pay

## 2014-07-17 ENCOUNTER — Emergency Department (INDEPENDENT_AMBULATORY_CARE_PROVIDER_SITE_OTHER)
Admission: EM | Admit: 2014-07-17 | Discharge: 2014-07-17 | Disposition: A | Discharge status: Home / Self Care | Payer: Self-pay | Source: Home / Self Care | Attending: Family Medicine | Admitting: Family Medicine

## 2014-07-17 DIAGNOSIS — S39012D Strain of muscle, fascia and tendon of lower back, subsequent encounter: Secondary | ICD-10-CM

## 2014-07-17 DIAGNOSIS — S161XXD Strain of muscle, fascia and tendon at neck level, subsequent encounter: Secondary | ICD-10-CM

## 2014-07-17 MED ORDER — NAPROXEN 375 MG PO TABS: 375.0000 mg | ORAL_TABLET | Freq: Two times a day (BID) | ORAL | Status: DC

## 2014-07-17 NOTE — Discharge Instructions (Signed)
Back Exercises Back exercises help treat and prevent back injuries. The goal of back exercises is to increase the strength of your abdominal and back muscles and the flexibility of your back. These exercises should be started when you no longer have back pain. Back exercises include:  Pelvic Tilt. Lie on your back with your knees bent. Tilt your pelvis until the lower part of your back is against the floor. Hold this position 5 to 10 sec and repeat 5 to 10 times.  Knee to Chest. Pull first 1 knee up against your chest and hold for 20 to 30 seconds, repeat this with the other knee, and then both knees. This may be done with the other leg straight or bent, whichever feels better.  Sit-Ups or Curl-Ups. Bend your knees 90 degrees. Start with tilting your pelvis, and do a partial, slow sit-up, lifting your trunk only 30 to 45 degrees off the floor. Take at least 2 to 3 seconds for each sit-up. Do not do sit-ups with your knees out straight. If partial sit-ups are difficult, simply do the above but with only tightening your abdominal muscles and holding it as directed.  Hip-Lift. Lie on your back with your knees flexed 90 degrees. Push down with your feet and shoulders as you raise your hips a couple inches off the floor; hold for 10 seconds, repeat 5 to 10 times.  Back arches. Lie on your stomach, propping yourself up on bent elbows. Slowly press on your hands, causing an arch in your low back. Repeat 3 to 5 times. Any initial stiffness and discomfort should lessen with repetition over time.  Shoulder-Lifts. Lie face down with arms beside your body. Keep hips and torso pressed to floor as you slowly lift your head and shoulders off the floor. Do not overdo your exercises, especially in the beginning. Exercises may cause you some mild back discomfort which lasts for a few minutes; however, if the pain is more severe, or lasts for more than 15 minutes, do not continue exercises until you see your caregiver.  Improvement with exercise therapy for back problems is slow.  See your caregivers for assistance with developing a proper back exercise program. Document Released: 05/20/2004 Document Revised: 07/05/2011 Document Reviewed: 02/11/2011 [facility] Patient Information 2015 [place], [place]. This information is not intended to replace advice given to you by your health care provider. Make sure you discuss any questions you have with your health care provider.  Back Pain, Adult Low back pain is very common. About 1 in 5 people have back pain.The cause of low back pain is rarely dangerous. The pain often gets better over time.About half of people with a sudden onset of back pain feel better in just 2 weeks. About 8 in 10 people feel better by 6 weeks.  CAUSES Some common causes of back pain include:  Strain of the muscles or ligaments supporting the spine.  Wear and tear (degeneration) of the spinal discs.  Arthritis.  Direct injury to the back. DIAGNOSIS Most of the time, the direct cause of low back pain is not known.However, back pain can be treated effectively even when the exact cause of the pain is unknown.Answering your caregiver's questions about your overall health and symptoms is one of the most accurate ways to make sure the cause of your pain is not dangerous. If your caregiver needs more information, he or she may order lab work or imaging tests (X-rays or MRIs).However, even if imaging tests show changes in your  back, this usually does not require surgery. HOME CARE INSTRUCTIONS For many people, back pain returns.Since low back pain is rarely dangerous, it is often a condition that people can learn to [facility] their own.   Remain active. It is stressful on the back to sit or stand in one place. Do not sit, drive, or stand in one place for more than 30 minutes at a time. Take short walks on level surfaces as soon as pain allows.Try to increase the length of time you walk each  day.  Do not stay in bed.Resting more than 1 or 2 days can delay your recovery.  Do not avoid exercise or work.Your body is made to move.It is not dangerous to be active, even though your back may hurt.Your back will likely heal faster if you return to being active before your pain is gone.  Pay attention to your body when you bend and lift. Many people have less discomfortwhen lifting if they bend their knees, keep the load close to their bodies,and avoid twisting. Often, the most comfortable positions are those that put less stress on your recovering back.  Find a comfortable position to sleep. Use a firm mattress and lie on your side with your knees slightly bent. If you lie on your back, put a pillow under your knees.  Only take over-the-counter or prescription medicines as directed by your caregiver. Over-the-counter medicines to reduce pain and inflammation are often the most helpful.Your caregiver may prescribe muscle relaxant drugs.These medicines help dull your pain so you can more quickly return to your normal activities and healthy exercise.  Put ice on the injured area.  Put ice in a plastic bag.  Place a towel between your skin and the bag.  Leave the ice on for 15-20 minutes, 03-04 times a day for the first 2 to 3 days. After that, ice and heat may be alternated to reduce pain and spasms.  Ask your caregiver about trying back exercises and gentle massage. This may be of some benefit.  Avoid feeling anxious or stressed.Stress increases muscle tension and can worsen back pain.It is important to recognize when you are anxious or stressed and learn ways to manage it.Exercise is a great option. SEEK MEDICAL CARE IF:  You have pain that is not relieved with rest or medicine.  You have pain that does not improve in 1 week.  You have new symptoms.  You are generally not feeling well. SEEK IMMEDIATE MEDICAL CARE IF:   You have pain that radiates from your back into  your legs.  You develop new bowel or bladder control problems.  You have unusual weakness or numbness in your arms or legs.  You develop nausea or vomiting.  You develop abdominal pain.  You feel faint. Document Released: 04/12/2005 Document Revised: 10/12/2011 Document Reviewed: 08/14/2013 [facility] Patient Information 2015 [place], [place]. This information is not intended to replace advice given to you by your health care provider. Make sure you discuss any questions you have with your health care provider.  Lumbosacral Strain Lumbosacral strain is a strain of any of the parts that make up your lumbosacral vertebrae. Your lumbosacral vertebrae are the bones that make up the lower third of your backbone. Your lumbosacral vertebrae are held together by muscles and tough, fibrous tissue (ligaments).  CAUSES  A sudden blow to your back can cause lumbosacral strain. Also, anything that causes an excessive stretch of the muscles in the low back can cause this strain. This is typically  seen when people exert themselves strenuously, fall, lift heavy objects, bend, or crouch repeatedly. RISK FACTORS  Physically demanding work.  Participation in pushing or pulling sports or sports that require a sudden twist of the back (tennis, golf, baseball).  Weight lifting.  Excessive lower back curvature.  Forward-tilted pelvis.  Weak back or abdominal muscles or both.  Tight hamstrings. SIGNS AND SYMPTOMS  Lumbosacral strain may cause pain in the area of your injury or pain that moves (radiates) down your leg.  DIAGNOSIS Your health care provider can often diagnose lumbosacral strain through a physical exam. In some cases, you may need tests such as X-ray exams.  TREATMENT  Treatment for your lower back injury depends on many factors that your clinician will have to evaluate. However, most treatment will include the use of anti-inflammatory medicines. HOME CARE INSTRUCTIONS   Avoid hard  physical activities (tennis, racquetball, waterskiing) if you are not in proper physical condition for it. This may aggravate or create problems.  If you have a back problem, avoid sports requiring sudden body movements. Swimming and walking are generally safer activities.  Maintain good posture.  Maintain a healthy weight.  For acute conditions, you may put ice on the injured area.  Put ice in a plastic bag.  Place a towel between your skin and the bag.  Leave the ice on for 20 minutes, 2-3 times a day.  When the low back starts healing, stretching and strengthening exercises may be recommended. SEEK MEDICAL CARE IF:  Your back pain is getting worse.  You experience severe back pain not relieved with medicines. SEEK IMMEDIATE MEDICAL CARE IF:   You have numbness, tingling, weakness, or problems with the use of your arms or legs.  There is a change in bowel or bladder control.  You have increasing pain in any area of the body, including your belly (abdomen).  You notice shortness of breath, dizziness, or feel faint.  You feel sick to your stomach (nauseous), are throwing up (vomiting), or become sweaty.  You notice discoloration of your toes or legs, or your feet get very cold. MAKE SURE YOU:   Understand these instructions.  Will watch your condition.  Will get help right away if you are not doing well or get worse. Document Released: 01/20/2005 Document Revised: 04/17/2013 Document Reviewed: 11/29/2012 [facility] Patient Information 2015 [place], [place]. This information is not intended to replace advice given to you by your health care provider. Make sure you discuss any questions you have with your health care provider.
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
## 2014-08-01 ENCOUNTER — Encounter: Payer: Medicaid Other | Admitting: Family Medicine

## 2015-01-01 ENCOUNTER — Emergency Department (HOSPITAL_COMMUNITY): Payer: Medicaid Other

## 2015-01-01 ENCOUNTER — Encounter (HOSPITAL_COMMUNITY): Payer: Self-pay | Admitting: Emergency Medicine

## 2015-01-01 ENCOUNTER — Observation Stay (HOSPITAL_COMMUNITY)
Admission: EM | Admit: 2015-01-01 | Discharge: 2015-01-02 | Disposition: A | Discharge status: Home / Self Care | Payer: Medicaid Other | Attending: Family Medicine | Admitting: Family Medicine

## 2015-01-01 DIAGNOSIS — R1310 Dysphagia, unspecified: Secondary | ICD-10-CM | POA: Insufficient documentation

## 2015-01-01 DIAGNOSIS — R0789 Other chest pain: Secondary | ICD-10-CM

## 2015-01-01 DIAGNOSIS — K219 Gastro-esophageal reflux disease without esophagitis: Secondary | ICD-10-CM | POA: Insufficient documentation

## 2015-01-01 DIAGNOSIS — K5900 Constipation, unspecified: Secondary | ICD-10-CM | POA: Insufficient documentation

## 2015-01-01 DIAGNOSIS — K2970 Gastritis, unspecified, without bleeding: Principal | ICD-10-CM | POA: Insufficient documentation

## 2015-01-01 DIAGNOSIS — D259 Leiomyoma of uterus, unspecified: Secondary | ICD-10-CM | POA: Diagnosis not present

## 2015-01-01 DIAGNOSIS — R110 Nausea: Secondary | ICD-10-CM

## 2015-01-01 DIAGNOSIS — R0602 Shortness of breath: Secondary | ICD-10-CM | POA: Diagnosis present

## 2015-01-01 DIAGNOSIS — N938 Other specified abnormal uterine and vaginal bleeding: Secondary | ICD-10-CM | POA: Diagnosis not present

## 2015-01-01 DIAGNOSIS — D573 Sickle-cell trait: Secondary | ICD-10-CM | POA: Insufficient documentation

## 2015-01-01 DIAGNOSIS — D509 Iron deficiency anemia, unspecified: Secondary | ICD-10-CM | POA: Diagnosis not present

## 2015-01-01 DIAGNOSIS — R079 Chest pain, unspecified: Secondary | ICD-10-CM | POA: Diagnosis present

## 2015-01-01 DIAGNOSIS — K920 Hematemesis: Secondary | ICD-10-CM

## 2015-01-01 DIAGNOSIS — R1314 Dysphagia, pharyngoesophageal phase: Secondary | ICD-10-CM | POA: Insufficient documentation

## 2015-01-01 DIAGNOSIS — K6289 Other specified diseases of anus and rectum: Secondary | ICD-10-CM | POA: Diagnosis not present

## 2015-01-01 DIAGNOSIS — D649 Anemia, unspecified: Secondary | ICD-10-CM | POA: Insufficient documentation

## 2015-01-01 HISTORY — DX: Anxiety disorder, unspecified: F41.9

## 2015-01-01 HISTORY — DX: Headache, unspecified: R51.9

## 2015-01-01 HISTORY — DX: Unspecified chronic bronchitis: J42

## 2015-01-01 HISTORY — DX: Headache: R51

## 2015-01-01 LAB — POC OCCULT BLOOD, ED: FECAL OCCULT BLD: NEGATIVE

## 2015-01-01 LAB — IRON AND TIBC
IRON: 10 ug/dL — AB (ref 28–170)
SATURATION RATIOS: 2 % — AB (ref 10.4–31.8)
TIBC: 444 ug/dL (ref 250–450)
UIBC: 434 ug/dL

## 2015-01-01 LAB — COMPREHENSIVE METABOLIC PANEL
ALBUMIN: 3.6 g/dL (ref 3.5–5.0)
ALK PHOS: 70 U/L (ref 38–126)
ALT: 23 U/L (ref 14–54)
ANION GAP: 6 (ref 5–15)
AST: 29 U/L (ref 15–41)
BILIRUBIN TOTAL: 0.4 mg/dL (ref 0.3–1.2)
BUN: 9 mg/dL (ref 6–20)
CALCIUM: 9.1 mg/dL (ref 8.9–10.3)
CO2: 24 mmol/L (ref 22–32)
CREATININE: 0.96 mg/dL (ref 0.44–1.00)
Chloride: 108 mmol/L (ref 101–111)
GFR calc Af Amer: >60 mL/min (ref >60)
GFR calc non Af Amer: >60 mL/min (ref >60)
GLUCOSE: 107 mg/dL — AB (ref 65–99)
Potassium: 3.8 mmol/L (ref 3.5–5.1)
Sodium: 138 mmol/L (ref 135–145)
TOTAL PROTEIN: 6.8 g/dL (ref 6.5–8.1)

## 2015-01-01 LAB — PREPARE RBC (CROSSMATCH)

## 2015-01-01 LAB — I-STAT TROPONIN, ED: Troponin i, poc: 0 ng/mL (ref 0.00–0.08)

## 2015-01-01 LAB — PREGNANCY, URINE: Preg Test, Ur: NEGATIVE

## 2015-01-01 LAB — PROTIME-INR
INR: 1.12 (ref 0.00–1.49)
PROTHROMBIN TIME: 14.6 s (ref 11.6–15.2)

## 2015-01-01 LAB — CBC
HCT: 24.1 % — ABNORMAL LOW (ref 36.0–46.0)
HCT: 22.8 % — ABNORMAL LOW (ref 36.0–46.0)
Hemoglobin: 7.2 g/dL — ABNORMAL LOW (ref 12.0–15.0)
Hemoglobin: 6.8 g/dL — CL (ref 12.0–15.0)
MCH: 16.8 pg — AB (ref 26.0–34.0)
MCH: 16.7 pg — AB (ref 26.0–34.0)
MCHC: 29.9 g/dL — AB (ref 30.0–36.0)
MCHC: 29.8 g/dL — AB (ref 30.0–36.0)
MCV: 56.2 fL — AB (ref 78.0–100.0)
MCV: 56.2 fL — AB (ref 78.0–100.0)
PLATELETS: 280 10*3/uL (ref 150–400)
PLATELETS: 266 10*3/uL (ref 150–400)
RBC: 4.29 MIL/uL (ref 3.87–5.11)
RBC: 4.06 MIL/uL (ref 3.87–5.11)
RDW: 19.2 % — AB (ref 11.5–15.5)
RDW: 19.2 % — ABNORMAL HIGH (ref 11.5–15.5)
WBC: 4.7 10*3/uL (ref 4.0–10.5)
WBC: 5.7 10*3/uL (ref 4.0–10.5)

## 2015-01-01 LAB — FERRITIN: Ferritin: 2 ng/mL — ABNORMAL LOW (ref 11–307)

## 2015-01-01 LAB — TROPONIN I: Troponin I: <0.03 ng/mL (ref <0.031)

## 2015-01-01 LAB — D-DIMER, QUANTITATIVE: D-Dimer, Quant: <0.27 ug/mL-FEU (ref 0.00–0.48)

## 2015-01-01 LAB — APTT: aPTT: 31 seconds (ref 24–37)

## 2015-01-01 MED ORDER — PANTOPRAZOLE SODIUM 40 MG IV SOLR
40.0000 mg | Freq: Once | INTRAVENOUS | Status: AC
  Administered 2015-01-01: 40 mg via INTRAVENOUS
  Filled 2015-01-01: qty 40

## 2015-01-01 MED ORDER — ONDANSETRON HCL 4 MG/2ML IJ SOLN
4.0000 mg | Freq: Four times a day (QID) | INTRAMUSCULAR | Status: DC | PRN
  Administered 2015-01-02: 4 mg via INTRAVENOUS

## 2015-01-01 MED ORDER — METOCLOPRAMIDE HCL 5 MG/ML IJ SOLN
10.0000 mg | Freq: Once | INTRAMUSCULAR | Status: AC
  Administered 2015-01-01: 10 mg via INTRAMUSCULAR
  Filled 2015-01-01: qty 2

## 2015-01-01 MED ORDER — PANTOPRAZOLE SODIUM 40 MG IV SOLR: 40.0000 mg | INTRAVENOUS | Status: DC

## 2015-01-01 MED ORDER — ONDANSETRON HCL 4 MG/2ML IJ SOLN
4.0000 mg | Freq: Once | INTRAMUSCULAR | Status: AC
  Administered 2015-01-01: 4 mg via INTRAVENOUS
  Filled 2015-01-01: qty 2

## 2015-01-01 MED ORDER — DEXTROSE-NACL 5-0.45 % IV SOLN
INTRAVENOUS | Status: DC
  Administered 2015-01-01 – 2015-01-02 (×2): via INTRAVENOUS

## 2015-01-01 MED ORDER — GI COCKTAIL ~~LOC~~
30.0000 mL | Freq: Once | ORAL | Status: AC
  Administered 2015-01-01: 30 mL via ORAL
  Filled 2015-01-01: qty 30

## 2015-01-01 MED ORDER — SODIUM CHLORIDE 0.9 % IJ SOLN
3.0000 mL | Freq: Two times a day (BID) | INTRAMUSCULAR | Status: DC
  Administered 2015-01-01 – 2015-01-02 (×2): 3 mL via INTRAVENOUS

## 2015-01-01 MED ORDER — SODIUM CHLORIDE 0.9 % IV SOLN
510.0000 mg | Freq: Once | INTRAVENOUS | Status: AC
  Administered 2015-01-01: 510 mg via INTRAVENOUS
  Filled 2015-01-01: qty 17

## 2015-01-01 MED ORDER — SODIUM CHLORIDE 0.9 % IV SOLN
Freq: Once | INTRAVENOUS | Status: AC
Start: 2015-01-01 — End: 2015-01-01
  Administered 2015-01-01: 23:00:00 via INTRAVENOUS

## 2015-01-01 MED ORDER — SODIUM CHLORIDE 0.9 % IV BOLUS (SEPSIS)
1000.0000 mL | Freq: Once | INTRAVENOUS | Status: AC
  Administered 2015-01-01: 1000 mL via INTRAVENOUS

## 2015-01-01 MED ORDER — ONDANSETRON HCL 4 MG/2ML IJ SOLN: 4.0000 mg | Freq: Once | INTRAMUSCULAR | Status: DC

## 2015-01-01 MED ORDER — ONDANSETRON HCL 4 MG/2ML IJ SOLN
4.0000 mg | Freq: Four times a day (QID) | INTRAMUSCULAR | Status: DC
  Administered 2015-01-01 – 2015-01-02 (×4): 4 mg via INTRAVENOUS
  Filled 2015-01-01 (×4): qty 2

## 2015-01-01 MED ORDER — ONDANSETRON HCL 4 MG PO TABS: 4.0000 mg | ORAL_TABLET | Freq: Four times a day (QID) | ORAL | Status: DC | PRN

## 2015-01-01 NOTE — ED Provider Notes (Signed)
CSN: [id number]     Arrival date & time 01/01/15  1037 History   First MD Initiated Contact with Patient 01/01/15 1046     Chief Complaint  Patient presents with  . Chest Pain  . Cough  . Emesis  . pain in rectum     HPI  Latoya Guzman is a 39 y.o. female presenting to the ED with complaints of chest pain. She tried calling her doctor's office today and they referred her to the ED. Patient stating she has been having chest pain intermittently for the last week. It has woken her up out of sleep. Chest pain is not exacerbated by anything or relieved by any activity. No radiation symptoms. It is centrally located chest pain. She has been suffering from what she calls severr reflux for the last 3 days.   Patient also endorsing nausea and vomiting for the last couple of week. She states she is unable to keep anything down. She has noticed blood in her emesis x1 that was dark red blood. She however usually does not look at her emesis. Of note, patient with history of blood transfusions in the past. She also states that she has pica in which she eats toilet tissue. Has been going on for years.   Has sharp sudden pains in her rectum that come and go. Denies any blood in stool.   Past Medical History  Diagnosis Date  . Blood transfusion 1998    Post C/S 2units  . Depression     Was on Lexapro Stopped when pregnant  . Preterm labor     PTL last two pregnancies  . Fibroid 03/2010    Noted on [place] in Dec.  . 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 Tricuspid valve regurgitation 02/27/2010  . Lupus Lupus Antigen     associated with pregnancy   . Sickle cell trait   . Anemia 12/19/2012  . Antiphospholipid antibody positive    Past Surgical History  Procedure Laterality Date  . Dilation and curettage of uterus  2005, 2006    1st for twin loss, 2nd for retained placenta  . Cesarean section  1998, 2009  . Tubal ligation    . Cesarean  section  11/14/2010    Procedure: CESAREAN SECTION;  Surgeon: Juliene Pina C. Hulan Fray, MD;  Location: [facility];  Service: Gynecology;  Laterality: N/A;  Repeat cesarean section with delivery of baby boy at 75. Apgars 9/9. Bilateral tubal ligation with filshie clips.    Family History  Problem Relation Age of Onset  . Hypertension Mother   . Lupus Sister   . Diabetes Maternal Uncle   . Cancer Maternal Grandmother    Social History  Substance Use Topics  . Smoking status: Never Smoker   . Smokeless tobacco: Never Used  . Alcohol Use: No     Comment: occ   OB History    Gravida Para Term Preterm AB TAB SAB Ectopic Multiple Living   10 8 6 2 2  2  1 7      Review of Systems  Constitutional: Negative for fever.  Respiratory: Negative for shortness of breath.   Cardiovascular: Positive for chest pain.  Gastrointestinal: Positive for nausea, vomiting and rectal pain. Negative for abdominal pain and blood in stool.  Musculoskeletal: Negative for back pain.  Also per HPI  Allergies  Review of patient's allergies indicates no known allergies.  Home Medications   Prior  to Admission medications   Medication Sig Start Date End Date Taking? Authorizing Provider  cyclobenzaprine (FLEXERIL) 10 MG tablet Take 1 tablet (10 mg total) by mouth 2 (two) times daily as needed for muscle spasms. Patient not taking: Reported on 01/01/2015 06/30/14   Charlann Lange, PA-C  ferrous sulfate (FERROUSUL) 325 (65 FE) MG tablet Take 1 tablet (325 mg total) by mouth 2 (two) times daily after a meal. Patient not taking: Reported on 06/30/2014 07/10/13   Rosemarie Ax, MD  naproxen (NAPROSYN) 375 MG tablet Take 1 tablet (375 mg total) by mouth 2 (two) times daily with a meal. For pain Patient not taking: Reported on 01/01/2015 07/17/14   Annett Gula H Presson, PA   BP 108/52 mmHg  Pulse 91  Temp(Src) 97.3 F (36.3 C) (Oral)  Resp 19  Ht 5\' 11"  (1.803 m)  Wt 235 lb (106.595 kg)  BMI 32.79 kg/m2  SpO2 100%  LMP  12/01/2014 (Approximate) Physical Exam  Constitutional: She is oriented to person, place, and time. She appears well-developed and well-nourished. No distress.  HENT:  Head: Normocephalic and atraumatic.  Mouth/Throat: Oropharynx is clear and moist.  Eyes: EOM are normal.  Cardiovascular: Normal rate, regular rhythm and intact distal pulses.   Murmur heard. Pulmonary/Chest: Effort normal and breath sounds normal. She exhibits tenderness.  Abdominal: Soft. Bowel sounds are normal. She exhibits no distension. There is no tenderness.  Musculoskeletal: Normal range of motion. She exhibits no edema.  Neurological: She is alert and oriented to person, place, and time.  Skin: Skin is warm and dry.  Psychiatric: She has a normal mood and affect.   ED Course  Procedures (including critical care time) Labs Review Labs Reviewed  COMPREHENSIVE METABOLIC PANEL - Abnormal; Notable for the following:    Glucose, Bld 107 (*)    All other components within normal limits  CBC - Abnormal; Notable for the following:    Hemoglobin 7.2 (*)    HCT 24.1 (*)    MCV 56.2 (*)    MCH 16.8 (*)    MCHC 29.9 (*)    RDW 19.2 (*)    All other components within normal limits  PREGNANCY, URINE  OCCULT BLOOD X 1 CARD TO LAB, STOOL  APTT  PROTIME-INR  I-STAT TROPOININ, ED  TYPE AND SCREEN    Imaging Review Dg Chest 2 View  01/01/2015   CLINICAL DATA:  Left sided and mid sternal chest pain with tightness and pain radiating down the left arm. Some shortness of breath. Symptoms began today. Dry cough, 1 week duration.  EXAM: CHEST  2 VIEW  COMPARISON:  12/19/2012  FINDINGS: Heart size is at the upper limits of normal. Mediastinal shadows are normal. The lungs are clear. The vascularity is normal. No effusions. Mild chronic spinal curvature. No acute bone finding.  IMPRESSION: No active cardiopulmonary disease.   Electronically Signed   By: Nelson Chimes M.D.   On: 01/01/2015 12:36   I have personally reviewed and  evaluated these images and lab results as part of my medical decision-making.   EKG Interpretation   Date/Time:  Wednesday January 01 2015 10:48:05 EDT Ventricular Rate:  95 PR Interval:  135 QRS Duration: 91 QT Interval:  364 QTC Calculation: 458 R Axis:   85 Text Interpretation:  normal sinus rhythm Consider left ventricular  hypertrophy No significant change was found Confirmed by CAMPOS  MD, KEVIN  ([id number]) on 01/01/2015 10:58:11 AM     MDM   Final diagnoses:  Chest pain, unspecified chest pain type  Anemia, unspecified anemia type  Hematemesis with nausea   Patient presented to ED for multiple symptoms. For her chest pain work-up: i-stat troponin negative, CXR normal, EKG with normal sinus rhythm. Patient does have history of mitral and tricuspid regurgitation. Chest pain seems atypical and more consistent with GI etiology. CBC showing signs of anemia with Hbg of 7.2 down from 8.6 a year ago. Patient has history of needing blood transfusions.  In patient with recurrent hematemesis will admit her for serial CBCs and  possible GI consult to consider endoscopy.   Luiz Blare, DO PGY-2, [place], DO 01/01/15 [place], MD 01/01/15 [id number]

## 2015-01-01 NOTE — Progress Notes (Signed)
Pt admitted to the unit at 1517. Pt mental status is alert, verbal, oriented x4. Pt oriented to room, staff, and call bell. Skin is intact. Full assessment charted in [facility]. Call bell within reach. Visitor guidelines reviewed w/ pt and/or family.

## 2015-01-01 NOTE — ED Notes (Signed)
Pt transported to xray 

## 2015-01-01 NOTE — H&P (Signed)
[facility] Admission History and Physical Service Pager: [phone]  Patient name: Latoya Guzman Medical record number: [id number] Date of birth: [birth year] Age: 39 y.o. Gender: female  Primary Care Provider: Clearance Coots, MD Consultants: Gastroenterology Code Status: FULL  Chief Complaint: Chest Pain  Assessment and Plan: HELYNE GENTHER is a 39 y.o. female presenting with nausea, non-productive cough with post-tussive emesis, chest pain, and one episode of hematemesis. PMH is significant for iron deficiency anemia with PICA, positive antiphospholipid antibody, mitral and tricuspid valve regurgitation, and sickle cell trait.  Nausea, Cough with Post-Tussive Emesis, Chest Pain: Patient's complaints of progressive nausea, cough with post-tussive emesis, chest pain, and single episode of hematemesis with history of GERD are of likely gastrointestinal etiology.  Differential diagnosis includes esophagitis secondary to long-standing GERD, gastritis, gastroduodenal ulcer, and vascular lesion. Hematemesis also possibly attributable to Mallory-Weis tear or from esophageal irritation/injury following repeated emesis. She does have a history of mild mitral and tricupsid regurgitation; however, ACS is less likely in this patient with atypical chest pain, extended time course, intermittent nature of pain, EKG without abnormality, and negative troponin x1. Patient saturating well on room air and without current shortness of breath, making pulmonary embolism a less likely diagnosis; however, with some tachycardia on admission and history of lupus anticoagulant. She is also without leukocytosis or fever, making acute infection less likely. - GI consulted, appreciate recommendations. - Per their recommendation, EGD tomorrow. May eventually need flexible sigmoidoscopy vs. full colonoscopy after resolution of upper GI symptoms. - Admit to telemetry - Continue to trend troponins  q6h until negative x3 - Repeat EKG 9/8 AM - D-dimer - FOBT - PT-INR, PTT - Type and screen - Protonix 40 mg IV daily - Zofran 4 mg IV q6h PRN  Microcytic Anemia: History of iron deficiency anemia exacerbated by dysfunctional uterine bleeding 2/2 fibroids and adenomyosis (2013 transvaginal US). Iron deficiency anemia with PICA since elementary school, previously managed with outpatient iron supplementation; however, she reports that she discontinued iron supplementation >1 year ago due to GI upset. Baseline hemoglobin appears to be around 8.0, as low as 6.1 in 09/2012 , today 7.2 with MCV 56. She has previously undergone transfusion of pRBCs and IV iron with a symptomatic anemia at hemoglobin of 7.1; previous presentation was chest pain. Current chest pain and dyspnea on exertion could certainly be representative of symptomatic anemia. She agrees to accepting transfusion again this hospitalization if necessary. - Iron studies: ferritin, TIBC, Fe - Following return of iron studies, will consider IV iron/pRBC transfusion.  Anti-Cardiolipin Antibody Positive: History of lupus anticoagulant, off Warfarin since 2012. She has previously been referred to Rheumatology; however she has not followed up on this. - Will encourage outpatient follow up with Rheumatology.  FEN/GI: NPO, D5-1/2 NS at 100 cc/hr Prophylaxis: SCDs  Disposition: Admit to telemetry.  History of Present Illness:  Latoya Guzman is a 39 y.o. female with history of iron-deficiency anemia and GERD presenting with two weeks of nausea, 10 days of dry cough with a throat-tickling sensation and post-tussive emesis, and one week of chest pain. Reports that she woke with chest pain on 8/31; pain is intermittent and non-exertional, located typically over the left breast with occasional occurrence underneath the sternum. In regard to the pain, she is unable to identify a trigger, alleviating factors, or exacerbating factors. She denies radiation  of pain, but does report an episode last week when her right arm went completely numb for 20 minutes; arm has  now returned to baseline. Also endorses two weeks of dyspnea on exertion, fatigue, and intermittent anxiety. Also reports worsening of her baseline PICA for toilet paper. She does report a history of GERD, as well as occasional chest discomfort associated with eating. Recently, she has developed an occasional sensation with eating that food is stuck in her distal esophagus, "like a bubble" that she needs to expel.  Of note, in our ED the patient had an episode of emesis that included three silver dollar-sized clots of blood. She denies previous episodes of hematemesis.  She also endorses pain in her bottom 2-4 times per day for 1 year that she describes as "like a labor contraction." Pain is severe enough to prevent her from continuing her activity and lasts for several seconds. She does report that this has happened more frequently in recent weeks. She endorses occasional constipation.  Review Of Systems: Per HPI with the following additions: Intermittent profuse sweating over the past 3 weeks. Otherwise 12 point review of systems was performed and was unremarkable.  Patient Active Problem List   Diagnosis Date Noted  . Bloody emesis 01/01/2015  . Preventive measure 09/14/2013  . Vaginitis and vulvovaginitis, unspecified 09/14/2013  . Phlebitis after infusion 07/13/2013  . Chest pain 12/19/2012  . Anemia 12/19/2012  . Left sided numbness 12/19/2012  . Depression 03/26/2011  . Elevated LFTs 12/25/2010  . Antiphospholipid antibody positive 08/25/2007   Past Medical History: Past Medical History  Diagnosis Date  . Blood transfusion 1998    Post C/S 2units  . Depression     Was on Lexapro Stopped when pregnant  . Preterm labor     PTL last two pregnancies  . Fibroid 03/2010    Noted on [place] in Dec.  . 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 Tricuspid valve regurgitation 02/27/2010  . Lupus Lupus Antigen     associated with pregnancy   . Sickle cell trait   . Anemia 12/19/2012  . Antiphospholipid antibody positive    Past Surgical History: Past Surgical History  Procedure Laterality Date  . Dilation and curettage of uterus  2005, 2006    1st for twin loss, 2nd for retained placenta  . Cesarean section  1998, 2009  . Tubal ligation    . Cesarean section  11/14/2010    Procedure: CESAREAN SECTION;  Surgeon: Juliene Pina C. Hulan Fray, MD;  Location: [facility];  Service: Gynecology;  Laterality: N/A;  Repeat cesarean section with delivery of baby boy at 61. Apgars 9/9. Bilateral tubal ligation with filshie clips.    Social History: Social History  Substance Use Topics  . Smoking status: Never Smoker   . Smokeless tobacco: Never Used  . Alcohol Use: No     Comment: occ   Additional social history: Denies use of marijuana and other recreational drugs. Lives at home with her four children. Please also refer to relevant sections of EMR.  Family History: Family History  Problem Relation Age of Onset  . Hypertension Mother   . Lupus Sister   . Diabetes Maternal Uncle   . Cancer Maternal Grandmother    Allergies and Medications: No Known Allergies No current facility-administered medications on file prior to encounter.   Current Outpatient Prescriptions on File Prior to Encounter  Medication Sig Dispense Refill  . cyclobenzaprine (FLEXERIL) 10 MG tablet Take 1 tablet (10 mg total) by mouth 2 (two) times daily as needed for muscle spasms. (Patient  not taking: Reported on 01/01/2015) 20 tablet 0  . ferrous sulfate (FERROUSUL) 325 (65 FE) MG tablet Take 1 tablet (325 mg total) by mouth 2 (two) times daily after a meal. (Patient not taking: Reported on 06/30/2014) 60 tablet 0  . naproxen (NAPROSYN) 375 MG tablet Take 1 tablet (375 mg total) by mouth 2 (two) times daily with a meal. For pain (Patient not taking: Reported  on 01/01/2015) 20 tablet 0   Objective: BP 108/52 mmHg  Pulse 91  Temp(Src) 97.3 F (36.3 C) (Oral)  Resp 19  Ht 5\' 11"  (1.803 m)  Wt 106.595 kg (235 lb)  BMI 32.79 kg/m2  SpO2 100%  LMP 12/01/2014 (Approximate)  Exam: General: Well-nourished woman resting comfortably while sitting up in bed.  No acute distress; ambulating in the halls on arrival to bedside. HEENT: Normocephalic and atraumatic. Sclera anicteric. Conjunctival pallor noted. Oropharynx clear and moist without lesions or erythema. Neck supple with full range of motion. Cardiovascular: Regular rate and rhythm. II/VI continuous murmur appreciated over the RUSB, LUSB, and LLSB. Radial and DP pulses 2+ bilaterally. Respiratory: Normal work of breathing on room air. Lungs clear to auscultation bilaterally without crackle/wheeze. Abdomen: Soft and non-distended without palpable masses or hepatosplenomegaly. Tenderness to palpation over the mid-epigastric region. Neurological: Alert and oriented to person, place, and time. EOMI. Muscle tone grossly normal. Gait intact. Psych: Pleasant with appropriate mood and affect. Skin: No rashes or lesions appreciated.  Labs and Imaging: CBC BMET   Recent Labs Lab 01/01/15 1122  WBC 4.7  HGB 7.2*  HCT 24.1*  PLT 280    Recent Labs Lab 01/01/15 1122  NA 138  K 3.8  CL 108  CO2 24  BUN 9  CREATININE 0.96  GLUCOSE 107*  CALCIUM 9.1     Toponin I: Negative x1 Urine Pregnancy Test: Negative APTT: 31 PT: 14.6 INR: 1.12  Dg Chest 2 View  01/01/2015   CLINICAL DATA:  Left sided and mid sternal chest pain with tightness and pain radiating down the left arm. Some shortness of breath. Symptoms began today. Dry cough, 1 week duration.  EXAM: CHEST  2 VIEW  COMPARISON:  12/19/2012  FINDINGS: Heart size is at the upper limits of normal. Mediastinal shadows are normal. The lungs are clear. The vascularity is normal. No effusions. Mild chronic spinal curvature. No acute bone finding.   IMPRESSION: No active cardiopulmonary disease.   Electronically Signed   By: Nelson Chimes M.D.   On: 01/01/2015 12:36    Orion Crook, Med Student 01/01/2015, 1:39 PM MS4, [place] Intern pager: [phone], text pages welcome  Resident Addendum: I have seen and evaluated the patient with Student Dr. Hassell Done. I am in agreement with the note above in its revised form.   Additionally:  39 y.o. female with h/o Fe def and lupus anticoagulant who takes no medicines presenting with 2 weeks of worsening nausea, emesis, and chest pain associated with exertional dyspnea. Single episode of small volume hematemesis today. No abd pain, BRBPR, melena, changes in bowel habits, dysuria. Has sensation of food sticking in esophagus without weight loss or fevers. Also with intermittent non-exertional, non-positional, non-pleuritic moderate chest pain, "tightness," sometimes substernal, sometimes under left breast and radiating to left arm. Denies orthopnea, LE swelling, palpitations but has felt fatigued and short of breath just walking around the house. This has worsened over the past week or two.   Hematemesis: Likely MW tear in setting of prolonged vomiting vs. gastritis/esophagitis/PUD. Has  had Dx GERD without EGD on file which could be cause of cough and mostly post-tussive emesis. Not actively bleeding, BUN 9. - Coags normal, extremely microcytic anemia with normal platelets.  - Start IV PPI, keep NPO pending GI evaluation as she will likely need EGD. Large bore IV, type and screen complete.  - IV anti-emetic  Severe iron deficiency anemia with pica due to DUB. Possible acute blood loss contribution (below hgb baseline).   - Will check Fe panel before IV iron. She may require periodic parenteral iron due to intolerance of po iron.  - Transfusion indicated due to symptomatic anemia. Recheck CBC - Consider endometrial ablation vs. hysterectomy as outpatient.   Chest pain and dyspnea on  exertion: Echo stress test in 2011 was negative, ECG and initial troponin reassuring against ACS. HR responded to IVF's, now borderline bradycardic saturating 100% on room air.  - Complete ACS r/o with cardiac enzymes, repeat ECG, and telemetry monitoring.  - Clinical suspicion for PE relatively low, but with +lupus anticoagulant will check D-Dimer.   Ryan B. Bonner Puna, MD, PGY-3 01/01/2015 6:54 PM]

## 2015-01-01 NOTE — ED Notes (Signed)
Pt has multiple complaints-- c/o chest pain x 3-4 days. States radiates to left hand, with arm/hand numbness. Also c/o coughing -- nonproductive, also vomiting after eating, last period in august. -- abnormal .

## 2015-01-01 NOTE — Consult Note (Signed)
[facility] Consult: 3:47 PM 01/01/2015     Referring Provider: Dr Andria Frames  Primary Care Physician:  Clearance Coots, MD family practice teaching service at [facility] Primary Gastroenterologist:  Althia Forts pt     Reason for Consultation:  vomitted some blood clots, dysphagia, rectal pain   HPI: Latoya Guzman is a 39 y.o. female.  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  Tricuspid and mitral valve regurge.   SS trait.  Hx iron deficiency anemia secondary to dysfunctional uterine bleeding, uterine fibroids.   She self discontinued po iron at least a year ago because it caused GI upset.  She had been on iron supplements on and off for many years.  Baseline Hgb ~ 8.0 or less. Hemoglobins as low as 6.1 in 09/2012.   Now 7.1 with MCV of 56. Previous blood transfusions in 06/2013, 11/2012 By CT angiogram of the chest in 06/2010 there was a 1.3 cm low-density structure at the hepatic dome, statistically likely to be a hemangioma  Patient has a lot of complaints which consist of rectal pain, chest pain, nausea, dysphagia, limited hematemesis, cough.  For quite a while the patient has had problems with mostly solid dysphagia. Food would feel like he was getting stuck at the region of the GE junction this may or may not be associated with pain.Marland Kitchen  She would also have nausea with some vomiting of partially digested food. In the last week or so this has progressed along with a dry cough.  She will cough and then shortly after she regurgitates material that is either clear or/foamy or sometimes vomits partially digested food. This morning was the first time she vomited and there were a few teaspoons of clotted blood in otherwise benign appearing emesis.  She will cough, regurgitate solids as well as liquids and  has sensation that stuff is getting stuck in the upper cervical esophageal region. She had an incidence of chest pain, location is just lateral to the left sternum in the region beneath her left breast. She had another similar episode within 24 hours of admission. Both of these were nonexertional, not associated with any GI distress or acute dysphagia. When she did have the chest pain it didn't last more than 10 minutes.  She does describe general dyspnea on exertion for about a week and become short of breath even just walking around the house. Finally the rectal pain, this is been present for one year and is debilitating when it occurs. It lasts maybe 5-10 minutes. When it happens she can't walk because the pain is so severe. It does not appear to be associated with whether or not she's had a bowel movement. She doesn't have pain when she is having a bowel movement. There's been no bleeding per rectum. Patient generally is a bit constipated and has a bowel movement every 2-3 days. However she had not had a bowel movement for 1 week up until this morning when she finally had a large, nonbloody, soft bowel movement. Doesn't been using over-the-counter laxitives.  Opting instead for herbal laxitives and  grape juice  Weight is near her pregnancy high in the 230s #.  No anorexia. No history of liver disease.  No extremity edema. No palpitations.  Patient has never taken antiacids, PPI or H2 blockers            Past Medical History  Diagnosis Date  . Blood transfusion 1998    Post C/S 2units  . Depression     Was on Lexapro Stopped when pregnant  . Preterm labor     PTL last two pregnancies  . Fibroid 03/2010    Noted on [place] in Dec.  . 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 Tricuspid valve regurgitation 02/27/2010  . Lupus Lupus Antigen     associated with pregnancy   . Sickle cell trait   . Anemia 12/19/2012  . Antiphospholipid antibody  positive     Past Surgical History  Procedure Laterality Date  . Dilation and curettage of uterus  2005, 2006    1st for twin loss, 2nd for retained placenta  . Cesarean section  1998, 2009  . Tubal ligation    . Cesarean section  11/14/2010    Procedure: CESAREAN SECTION;  Surgeon: Juliene Pina C. Hulan Fray, MD;  Location: [facility];  Service: Gynecology;  Laterality: N/A;  Repeat cesarean section with delivery of baby boy at 75. Apgars 9/9. Bilateral tubal ligation with filshie clips.     Prior to Admission medications   Medication Sig Start Date End Date Taking? Authorizing Provider  ferrous sulfate (FERROUSUL) 325 (65 FE) MG tablet Take 1 tablet (325 mg total) by mouth 2 (two) times daily after a meal. Patient not taking: Reported on 06/30/2014 07/10/13   Rosemarie Ax, MD    Scheduled Meds:  Infusions:  PRN Meds:    Allergies as of 01/01/2015  . (No Known Allergies)    Family History  Problem Relation Age of Onset  . Hypertension Mother   . Lupus Sister   . Diabetes Maternal Uncle   . Cancer Maternal Grandmother     Social History   Social History  . Marital Status: Single    Spouse Name: N/A  . Number of Children: N/A  . Years of Education: N/A   Occupational History  . Not on file.   Social History Main Topics  . Smoking status: Never Smoker   . Smokeless tobacco: Never Used  . Alcohol Use: No     Comment: occ  . Drug Use: No  . Sexual Activity: No   Other Topics Concern  . Not on file   Social History Narrative    REVIEW OF SYSTEMS: Constitutional:  Does not describe weakness. Doesn't describe significant fatigue. ENT:  No nose bleeds Pulm:  Per HPI CV:  No palpitations, no LE edema.  GU:  No hematuria, no frequency GI:  Per HPI.  In March/2015 hepatitis A IgM, hepatitis B surface Ag, hep C IgM and HCV Ab were all negative. Heme:  No excessive bleeding or bruising   Transfusions:  Associated with childbirth Neuro:  No headaches, no peripheral tingling  or numbness Derm:  No itching, no rash or sores.  Endocrine:  No sweats or chills.  No polyuria or dysuria Immunization:  Reviewed immunization records. She had Td 2012 and flu shot 06/2013 Travel:  None beyond local counties in last few months.    PHYSICAL EXAM: Vital signs in last 24 hours: Filed Vitals:   01/01/15 1512  BP: 105/62  Pulse: 63  Temp: 97.9 F (36.6 C)  Resp: 18   Wt Readings from Last 3 Encounters:  01/01/15 235 lb (106.595 kg)  06/30/14 235 lb (106.595 kg)  09/14/13 219 lb (99.338 kg)    General: Pleasant, well-appearing, obese AA after. She speaks rapidly Head:  No swelling, no facial asymmetry. No signs of head trauma.  Eyes:  No scleral icterus or pallor Ears:  Not hard of hearing  Nose:  No congestion or discharge Mouth:  Clear, moist, good dentition. Neck:  No mass, no JVD, no TMG. Lungs:  Clear bilaterally. No shortness of breath. No cough. Heart: RRR. No MRG. S1/S2 audible. Abdomen:  Obese, soft, not distended. Minimal tenderness in the right upper quadrant. No HSM or masses. No bruits. No hernias.   Rectal: No visible or palpable hernias. No masses. No stool in the rectal vault.   Musc/Skeltl: No joint swelling, deformity or erythema. Extremities:  No pedal edema. Pedal pulses 3+ bilaterally.  Neurologic:  Alert. Oriented 3. No tremor, no limb weakness. Skin:  No rash, no telangiectasia, no bruising Tattoos:  Several on the arms Nodes:  No cervical adenopathy.   Psych:  Pleasant, animated, cooperative. Somewhat scattered as a historian but precise.  Intake/Output from previous day:   Intake/Output this shift:    LAB RESULTS:  Recent Labs  01/01/15 1122  WBC 4.7  HGB 7.2*  HCT 24.1*  PLT 280   BMET Lab Results  Component Value Date   NA 138 01/01/2015   NA 139 09/14/2013   NA 136* 09/05/2013   K 3.8 01/01/2015   K 4.1 09/14/2013   K 3.8 09/05/2013   CL 108 01/01/2015   CL 104 09/14/2013   CL 100 09/05/2013   CO2 24  01/01/2015   CO2 26 09/14/2013   CO2 22 09/05/2013   GLUCOSE 107* 01/01/2015   GLUCOSE 88 09/14/2013   GLUCOSE 120* 09/05/2013   BUN 9 01/01/2015   BUN 13 09/14/2013   BUN 10 09/05/2013   CREATININE 0.96 01/01/2015   CREATININE 0.87 09/14/2013   CREATININE 0.98 09/05/2013   CALCIUM 9.1 01/01/2015   CALCIUM 9.2 09/14/2013   CALCIUM 9.3 09/05/2013   LFT  Recent Labs  01/01/15 1122  PROT 6.8  ALBUMIN 3.6  AST 29  ALT 23  ALKPHOS 70  BILITOT 0.4   PT/INR Lab Results  Component Value Date   INR 1.12 01/01/2015   INR 1.19 12/19/2012   INR 1.13 09/20/2012   Hepatitis Panel No results for input(s): HEPBSAG, HCVAB, HEPAIGM, HEPBIGM in the last 72 hours. C-Diff No components found for: CDIFF Lipase     Component Value Date/Time   LIPASE 36 12/19/2012 1749    RADIOLOGY STUDIES: Dg Chest 2 View  01/01/2015   CLINICAL DATA:  Left sided and mid sternal chest pain with tightness and pain radiating down the left arm. Some shortness of breath. Symptoms began today. Dry cough, 1 week duration.  EXAM: CHEST  2 VIEW  COMPARISON:  12/19/2012  FINDINGS: Heart size is at the upper limits of normal. Mediastinal shadows are normal. The lungs are clear. The vascularity is normal. No effusions. Mild chronic spinal curvature. No acute bone finding.  IMPRESSION: No active cardiopulmonary disease.   Electronically Signed   By: Nelson Chimes M.D.   On: 01/01/2015 12:36    ENDOSCOPIC STUDIES: None ever   IMPRESSION:   *  Hematemesis, limited. Suspect this may be from a minor Mallory-Weiss tear or  from esophageal irritation/injury related to repeated emesis. Baseline symptoms of dysphagia and regurgitation, nausea. Rule out GERD.  *  Long-standing iron deficiency anemia and sickle cell trait. Hemoglobin below her already low baseline. This may be the explanation for her recent DOE.  So far no transfusion orders in place.  *  Atypical chest pain. Single troponin I is normal at 0. Despite  currently normal oxygen saturations, given the recent shortness of breath and her anti-phospholipid/anticardiolipin/lupus anticoagulate positivity, does she need rule out of PE?  *  Proctalgia. Sounds like rectal spasm.    PLAN:     *  EGD planned for tomorrow, time not yet set. Eventually may need flexible sigmoidoscopy versus full colonoscopy but for the time being with her upper GI symptoms, completing bowel prep may be problematic.  Agree with initiation of Protonix, once daily IV is fine for now.  *  Note that iron studies are pending. With her MCV of 56, this is certainly iron deficiency anemia. Since she doesn't tolerate by mouth iron well, it may be prudent to treat her with parenteral iron prior to her discharge.   Azucena Freed  01/01/2015, 3:47 PM Pager: [phone]   Attending Addendum: I have taken an interval history, reviewed the chart, and examined the patient. I agree with the Advanced Practitioner's note, impression, and recommendations. 39 y/o female with chronic microcytic anemia, attributed to heavy menses per patient, presenting with chest discomfort in the setting of recent nausea/vomiting, scant hematemesis. Cardiac workup negative thus far. She endorses baseline reflux symptoms as well as dysphagia. HR in the 60s with normal oxygen sats, PE seems unlikely but defer to primary service if they feel workup is indicated for this. Some chest wall pain reproducible on exam today, perhaps a component is musculoskeletal. Based on recent history suspect Mallory Weiss tear in the setting of vomiting is high on the differential to have caused her hematemesis, but offered an EGD to exclude other pathology such as PUD and evaluate her dysphagia/vomiting in light of her anemia. She has no evidence of active GI bleeding at this time. Her BUN is normal, passing brown stool. Vitals stable. She otherwise does endorse intermittent rectal discomfort ongoing for the past year, usually in the  setting of constipation and passing hard stool. In light of her anemia I am recommending a colonoscopy to further evaluate this issue however would perform this as an outpatient, and don't think she could tolerate a bowel preparation with her present upper tract symptoms. Overall, her anemia appears chronic and given her menstrual history, think this is the more likely etiology. Agree with PPI and NPO overnight until we can perform her endoscopy. Transfuse to keep Hgb > 7. Please notify us if she has any recurrence of bleeding or changes in her status overnight.    Cellar, MD [facility] Pager [phone]

## 2015-01-01 NOTE — ED Notes (Signed)
MD at bedside. Admitting  

## 2015-01-01 NOTE — Progress Notes (Signed)
CRITICAL VALUE ALERT  Critical value received:  hemoglobin 6.8   Date of notification:  01/01/15  Time of notification:  2015  Critical value read back:Yes.    Nurse who received alert:  Rosealee Albee  MD notified (1st page):  Schorr, NP  Time of first page:  2017  MD notified (2nd page):  Time of second page:  Responding MD:  Hilbert Bible, NP  Time MD responded:  2027

## 2015-01-02 ENCOUNTER — Observation Stay (HOSPITAL_COMMUNITY): Payer: Medicaid Other | Admitting: Anesthesiology

## 2015-01-02 ENCOUNTER — Encounter (HOSPITAL_COMMUNITY): Payer: Self-pay | Admitting: *Deleted

## 2015-01-02 ENCOUNTER — Encounter (HOSPITAL_COMMUNITY)
Admission: EM | Disposition: A | Discharge status: Home / Self Care | Payer: Self-pay | Source: Home / Self Care | Attending: Emergency Medicine

## 2015-01-02 DIAGNOSIS — K920 Hematemesis: Secondary | ICD-10-CM | POA: Diagnosis not present

## 2015-01-02 DIAGNOSIS — D509 Iron deficiency anemia, unspecified: Secondary | ICD-10-CM

## 2015-01-02 DIAGNOSIS — K2970 Gastritis, unspecified, without bleeding: Secondary | ICD-10-CM | POA: Diagnosis not present

## 2015-01-02 DIAGNOSIS — D649 Anemia, unspecified: Secondary | ICD-10-CM

## 2015-01-02 DIAGNOSIS — R1310 Dysphagia, unspecified: Secondary | ICD-10-CM | POA: Diagnosis not present

## 2015-01-02 DIAGNOSIS — R1314 Dysphagia, pharyngoesophageal phase: Secondary | ICD-10-CM

## 2015-01-02 DIAGNOSIS — R0609 Other forms of dyspnea: Secondary | ICD-10-CM

## 2015-01-02 DIAGNOSIS — R079 Chest pain, unspecified: Secondary | ICD-10-CM | POA: Insufficient documentation

## 2015-01-02 DIAGNOSIS — K219 Gastro-esophageal reflux disease without esophagitis: Secondary | ICD-10-CM | POA: Diagnosis not present

## 2015-01-02 HISTORY — PX: ESOPHAGOGASTRODUODENOSCOPY: SHX5428

## 2015-01-02 LAB — CBC
HEMATOCRIT: 29.4 % — AB (ref 36.0–46.0)
HEMOGLOBIN: 9 g/dL — AB (ref 12.0–15.0)
MCH: 18.3 pg — ABNORMAL LOW (ref 26.0–34.0)
MCHC: 30.6 g/dL (ref 30.0–36.0)
MCV: 59.9 fL — AB (ref 78.0–100.0)
Platelets: 240 10*3/uL (ref 150–400)
RBC: 4.91 MIL/uL (ref 3.87–5.11)
RDW: 23.1 % — ABNORMAL HIGH (ref 11.5–15.5)
WBC: 5.6 10*3/uL (ref 4.0–10.5)

## 2015-01-02 SURGERY — EGD (ESOPHAGOGASTRODUODENOSCOPY)
Anesthesia: Monitor Anesthesia Care

## 2015-01-02 MED ORDER — PROPOFOL 10 MG/ML IV BOLUS
INTRAVENOUS | Status: AC
  Filled 2015-01-02: qty 20

## 2015-01-02 MED ORDER — FENTANYL CITRATE (PF) 100 MCG/2ML IJ SOLN
INTRAMUSCULAR | Status: DC | PRN
  Administered 2015-01-02 (×2): 50 ug via INTRAVENOUS

## 2015-01-02 MED ORDER — PROPOFOL INFUSION 10 MG/ML OPTIME
INTRAVENOUS | Status: DC | PRN
  Administered 2015-01-02: 50 ug/kg/min via INTRAVENOUS

## 2015-01-02 MED ORDER — LACTATED RINGERS IV SOLN
INTRAVENOUS | Status: DC | PRN
  Administered 2015-01-02: 15:00:00 via INTRAVENOUS

## 2015-01-02 MED ORDER — MIDAZOLAM HCL 2 MG/2ML IJ SOLN
INTRAMUSCULAR | Status: AC
  Filled 2015-01-02: qty 4

## 2015-01-02 MED ORDER — BUTAMBEN-TETRACAINE-BENZOCAINE 2-2-14 % EX AERO
INHALATION_SPRAY | CUTANEOUS | Status: DC | PRN
  Administered 2015-01-02: 2 via TOPICAL

## 2015-01-02 MED ORDER — PROPOFOL 10 MG/ML IV BOLUS
INTRAVENOUS | Status: DC | PRN
  Administered 2015-01-02: 30 mg via INTRAVENOUS

## 2015-01-02 MED ORDER — FENTANYL CITRATE (PF) 250 MCG/5ML IJ SOLN
INTRAMUSCULAR | Status: AC
  Filled 2015-01-02: qty 5

## 2015-01-02 MED ORDER — SODIUM CHLORIDE 0.9 % IV SOLN: INTRAVENOUS | Status: DC

## 2015-01-02 MED ORDER — GI COCKTAIL ~~LOC~~
30.0000 mL | Freq: Once | ORAL | Status: DC
  Filled 2015-01-02: qty 30

## 2015-01-02 MED ORDER — HYDROMORPHONE HCL 1 MG/ML IJ SOLN: 0.2500 mg | INTRAMUSCULAR | Status: DC | PRN

## 2015-01-02 MED ORDER — MIDAZOLAM HCL 5 MG/5ML IJ SOLN
INTRAMUSCULAR | Status: DC | PRN
  Administered 2015-01-02: 2 mg via INTRAVENOUS

## 2015-01-02 MED ORDER — ONDANSETRON HCL 4 MG/2ML IJ SOLN
INTRAMUSCULAR | Status: AC
  Filled 2015-01-02: qty 2

## 2015-01-02 MED ORDER — POLYETHYLENE GLYCOL 3350 17 G PO PACK: 17.0000 g | PACK | Freq: Every day | ORAL | Status: DC

## 2015-01-02 MED ORDER — LACTATED RINGERS IV SOLN
Freq: Once | INTRAVENOUS | Status: AC
  Administered 2015-01-02: 1000 mL via INTRAVENOUS

## 2015-01-02 MED ORDER — PROMETHAZINE HCL 25 MG/ML IJ SOLN
6.2500 mg | INTRAMUSCULAR | Status: DC | PRN
Start: 2015-01-02 — End: 2015-01-03

## 2015-01-02 NOTE — Anesthesia Postprocedure Evaluation (Signed)
Anesthesia Post Note  Patient: Latoya Guzman  Procedure(s) Performed: Procedure(s) (LRB): ESOPHAGOGASTRODUODENOSCOPY (EGD) (N/A)  Anesthesia type: MAC  Patient location: PACU  Post pain: Pain level controlled  Post assessment: Patient's Cardiovascular Status Stable  Last Vitals:  Filed Vitals:   01/02/15 1643  BP: 128/75  Pulse: 60  Temp:   Resp: 19    Post vital signs: Reviewed and stable  Level of consciousness: sedated  Complications: No apparent anesthesia complications

## 2015-01-02 NOTE — Progress Notes (Signed)
Sleep study referral faxed and received.

## 2015-01-02 NOTE — Progress Notes (Signed)
Pt dc via private vehicle. Discharge summary given and reviewed with pt. IV dc'd. Skin is in tact. All belongings sent with pt.

## 2015-01-02 NOTE — Interval H&P Note (Signed)
History and Physical Interval Note:  01/02/2015 2:05 PM  Latoya Guzman  has presented today for surgery, with the diagnosis of Evaluate hematemesis, chest pain, nausea, dysphagia.  The various methods of treatment have been discussed with the patient and family. After consideration of risks, benefits and other options for treatment, the patient has consented to  Procedure(s): ESOPHAGOGASTRODUODENOSCOPY (EGD) (N/A) as a surgical intervention .  The patient's history has been reviewed, patient examined, no change in status, stable for surgery.  I have reviewed the patient's chart and labs.  Questions were answered to the patient's satisfaction.     Renelda Loma Armbruster

## 2015-01-02 NOTE — H&P (View-Only) (Signed)
[facility] Consult: 3:47 PM 01/01/2015     Referring Provider: Dr Andria Frames  Primary Care Physician:  Clearance Coots, MD family practice teaching service at [facility] Primary Gastroenterologist:  Althia Forts pt     Reason for Consultation:  vomitted some blood clots, dysphagia, rectal pain   HPI: Latoya Guzman is a 39 y.o. female.  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  Tricuspid and mitral valve regurge.   SS trait.  Hx iron deficiency anemia secondary to dysfunctional uterine bleeding, uterine fibroids.   She self discontinued po iron at least a year ago because it caused GI upset.  She had been on iron supplements on and off for many years.  Baseline Hgb ~ 8.0 or less. Hemoglobins as low as 6.1 in 09/2012.   Now 7.1 with MCV of 56. Previous blood transfusions in 06/2013, 11/2012 By CT angiogram of the chest in 06/2010 there was a 1.3 cm low-density structure at the hepatic dome, statistically likely to be a hemangioma  Patient has a lot of complaints which consist of rectal pain, chest pain, nausea, dysphagia, limited hematemesis, cough.  For quite a while the patient has had problems with mostly solid dysphagia. Food would feel like he was getting stuck at the region of the GE junction this may or may not be associated with pain.Marland Kitchen  She would also have nausea with some vomiting of partially digested food. In the last week or so this has progressed along with a dry cough.  She will cough and then shortly after she regurgitates material that is either clear or/foamy or sometimes vomits partially digested food. This morning was the first time she vomited and there were a few teaspoons of clotted blood in otherwise benign appearing emesis.  She will cough, regurgitate solids as well as liquids and  has sensation that stuff is getting stuck in the upper cervical esophageal region. She had an incidence of chest pain, location is just lateral to the left sternum in the region beneath her left breast. She had another similar episode within 24 hours of admission. Both of these were nonexertional, not associated with any GI distress or acute dysphagia. When she did have the chest pain it didn't last more than 10 minutes.  She does describe general dyspnea on exertion for about a week and become short of breath even just walking around the house. Finally the rectal pain, this is been present for one year and is debilitating when it occurs. It lasts maybe 5-10 minutes. When it happens she can't walk because the pain is so severe. It does not appear to be associated with whether or not she's had a bowel movement. She doesn't have pain when she is having a bowel movement. There's been no bleeding per rectum. Patient generally is a bit constipated and has a bowel movement every 2-3 days. However she had not had a bowel movement for 1 week up until this morning when she finally had a large, nonbloody, soft bowel movement. Doesn't been using over-the-counter laxitives.  Opting instead for herbal laxitives and  grape juice  Weight is near her pregnancy high in the 230s #.  No anorexia. No history of liver disease.  No extremity edema. No palpitations.  Patient has never taken antiacids, PPI or H2 blockers            Past Medical History  Diagnosis Date  . Blood transfusion 1998    Post C/S 2units  . Depression     Was on Lexapro Stopped when pregnant  . Preterm labor     PTL last two pregnancies  . Fibroid 03/2010    Noted on [place] in Dec.  . 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 Tricuspid valve regurgitation 02/27/2010  . Lupus Lupus Antigen     associated with pregnancy   . Sickle cell trait   . Anemia 12/19/2012  . Antiphospholipid antibody  positive     Past Surgical History  Procedure Laterality Date  . Dilation and curettage of uterus  2005, 2006    1st for twin loss, 2nd for retained placenta  . Cesarean section  1998, 2009  . Tubal ligation    . Cesarean section  11/14/2010    Procedure: CESAREAN SECTION;  Surgeon: Juliene Pina C. Hulan Fray, MD;  Location: [facility];  Service: Gynecology;  Laterality: N/A;  Repeat cesarean section with delivery of baby boy at 84. Apgars 9/9. Bilateral tubal ligation with filshie clips.     Prior to Admission medications   Medication Sig Start Date End Date Taking? Authorizing Provider  ferrous sulfate (FERROUSUL) 325 (65 FE) MG tablet Take 1 tablet (325 mg total) by mouth 2 (two) times daily after a meal. Patient not taking: Reported on 06/30/2014 07/10/13   Rosemarie Ax, MD    Scheduled Meds:  Infusions:  PRN Meds:    Allergies as of 01/01/2015  . (No Known Allergies)    Family History  Problem Relation Age of Onset  . Hypertension Mother   . Lupus Sister   . Diabetes Maternal Uncle   . Cancer Maternal Grandmother     Social History   Social History  . Marital Status: Single    Spouse Name: N/A  . Number of Children: N/A  . Years of Education: N/A   Occupational History  . Not on file.   Social History Main Topics  . Smoking status: Never Smoker   . Smokeless tobacco: Never Used  . Alcohol Use: No     Comment: occ  . Drug Use: No  . Sexual Activity: No   Other Topics Concern  . Not on file   Social History Narrative    REVIEW OF SYSTEMS: Constitutional:  Does not describe weakness. Doesn't describe significant fatigue. ENT:  No nose bleeds Pulm:  Per HPI CV:  No palpitations, no LE edema.  GU:  No hematuria, no frequency GI:  Per HPI.  In March/2015 hepatitis A IgM, hepatitis B surface Ag, hep C IgM and HCV Ab were all negative. Heme:  No excessive bleeding or bruising   Transfusions:  Associated with childbirth Neuro:  No headaches, no peripheral tingling  or numbness Derm:  No itching, no rash or sores.  Endocrine:  No sweats or chills.  No polyuria or dysuria Immunization:  Reviewed immunization records. She had Td 2012 and flu shot 06/2013 Travel:  None beyond local counties in last few months.    PHYSICAL EXAM: Vital signs in last 24 hours: Filed Vitals:   01/01/15 1512  BP: 105/62  Pulse: 63  Temp: 97.9 F (36.6 C)  Resp: 18   Wt Readings from Last 3 Encounters:  01/01/15 235 lb (106.595 kg)  06/30/14 235 lb (106.595 kg)  09/14/13 219 lb (99.338 kg)    General: Pleasant, well-appearing, obese AA after. She speaks rapidly Head:  No swelling, no facial asymmetry. No signs of head trauma.  Eyes:  No scleral icterus or pallor Ears:  Not hard of hearing  Nose:  No congestion or discharge Mouth:  Clear, moist, good dentition. Neck:  No mass, no JVD, no TMG. Lungs:  Clear bilaterally. No shortness of breath. No cough. Heart: RRR. No MRG. S1/S2 audible. Abdomen:  Obese, soft, not distended. Minimal tenderness in the right upper quadrant. No HSM or masses. No bruits. No hernias.   Rectal: No visible or palpable hernias. No masses. No stool in the rectal vault.   Musc/Skeltl: No joint swelling, deformity or erythema. Extremities:  No pedal edema. Pedal pulses 3+ bilaterally.  Neurologic:  Alert. Oriented 3. No tremor, no limb weakness. Skin:  No rash, no telangiectasia, no bruising Tattoos:  Several on the arms Nodes:  No cervical adenopathy.   Psych:  Pleasant, animated, cooperative. Somewhat scattered as a historian but precise.  Intake/Output from previous day:   Intake/Output this shift:    LAB RESULTS:  Recent Labs  01/01/15 1122  WBC 4.7  HGB 7.2*  HCT 24.1*  PLT 280   BMET Lab Results  Component Value Date   NA 138 01/01/2015   NA 139 09/14/2013   NA 136* 09/05/2013   K 3.8 01/01/2015   K 4.1 09/14/2013   K 3.8 09/05/2013   CL 108 01/01/2015   CL 104 09/14/2013   CL 100 09/05/2013   CO2 24  01/01/2015   CO2 26 09/14/2013   CO2 22 09/05/2013   GLUCOSE 107* 01/01/2015   GLUCOSE 88 09/14/2013   GLUCOSE 120* 09/05/2013   BUN 9 01/01/2015   BUN 13 09/14/2013   BUN 10 09/05/2013   CREATININE 0.96 01/01/2015   CREATININE 0.87 09/14/2013   CREATININE 0.98 09/05/2013   CALCIUM 9.1 01/01/2015   CALCIUM 9.2 09/14/2013   CALCIUM 9.3 09/05/2013   LFT  Recent Labs  01/01/15 1122  PROT 6.8  ALBUMIN 3.6  AST 29  ALT 23  ALKPHOS 70  BILITOT 0.4   PT/INR Lab Results  Component Value Date   INR 1.12 01/01/2015   INR 1.19 12/19/2012   INR 1.13 09/20/2012   Hepatitis Panel No results for input(s): HEPBSAG, HCVAB, HEPAIGM, HEPBIGM in the last 72 hours. C-Diff No components found for: CDIFF Lipase     Component Value Date/Time   LIPASE 36 12/19/2012 1749    RADIOLOGY STUDIES: Dg Chest 2 View  01/01/2015   CLINICAL DATA:  Left sided and mid sternal chest pain with tightness and pain radiating down the left arm. Some shortness of breath. Symptoms began today. Dry cough, 1 week duration.  EXAM: CHEST  2 VIEW  COMPARISON:  12/19/2012  FINDINGS: Heart size is at the upper limits of normal. Mediastinal shadows are normal. The lungs are clear. The vascularity is normal. No effusions. Mild chronic spinal curvature. No acute bone finding.  IMPRESSION: No active cardiopulmonary disease.   Electronically Signed   By: Nelson Chimes M.D.   On: 01/01/2015 12:36    ENDOSCOPIC STUDIES: None ever   IMPRESSION:   *  Hematemesis, limited. Suspect this may be from a minor Mallory-Weiss tear or  from esophageal irritation/injury related to repeated emesis. Baseline symptoms of dysphagia and regurgitation, nausea. Rule out GERD.  *  Long-standing iron deficiency anemia and sickle cell trait. Hemoglobin below her already low baseline. This may be the explanation for her recent DOE.  So far no transfusion orders in place.  *  Atypical chest pain. Single troponin I is normal at 0. Despite  currently normal oxygen saturations, given the recent shortness of breath and her anti-phospholipid/anticardiolipin/lupus anticoagulate positivity, does she need rule out of PE?  *  Proctalgia. Sounds like rectal spasm.    PLAN:     *  EGD planned for tomorrow, time not yet set. Eventually may need flexible sigmoidoscopy versus full colonoscopy but for the time being with her upper GI symptoms, completing bowel prep may be problematic.  Agree with initiation of Protonix, once daily IV is fine for now.  *  Note that iron studies are pending. With her MCV of 56, this is certainly iron deficiency anemia. Since she doesn't tolerate by mouth iron well, it may be prudent to treat her with parenteral iron prior to her discharge.   Azucena Freed  01/01/2015, 3:47 PM Pager: [phone]   Attending Addendum: I have taken an interval history, reviewed the chart, and examined the patient. I agree with the Advanced Practitioner's note, impression, and recommendations. 39 y/o female with chronic microcytic anemia, attributed to heavy menses per patient, presenting with chest discomfort in the setting of recent nausea/vomiting, scant hematemesis. Cardiac workup negative thus far. She endorses baseline reflux symptoms as well as dysphagia. HR in the 60s with normal oxygen sats, PE seems unlikely but defer to primary service if they feel workup is indicated for this. Some chest wall pain reproducible on exam today, perhaps a component is musculoskeletal. Based on recent history suspect Mallory Weiss tear in the setting of vomiting is high on the differential to have caused her hematemesis, but offered an EGD to exclude other pathology such as PUD and evaluate her dysphagia/vomiting in light of her anemia. She has no evidence of active GI bleeding at this time. Her BUN is normal, passing brown stool. Vitals stable. She otherwise does endorse intermittent rectal discomfort ongoing for the past year, usually in the  setting of constipation and passing hard stool. In light of her anemia I am recommending a colonoscopy to further evaluate this issue however would perform this as an outpatient, and don't think she could tolerate a bowel preparation with her present upper tract symptoms. Overall, her anemia appears chronic and given her menstrual history, think this is the more likely etiology. Agree with PPI and NPO overnight until we can perform her endoscopy. Transfuse to keep Hgb > 7. Please notify us if she has any recurrence of bleeding or changes in her status overnight.   [place] Cellar, MD [facility] Pager [phone]

## 2015-01-02 NOTE — Anesthesia Preprocedure Evaluation (Addendum)
Anesthesia Evaluation  Patient identified by MRN, date of birth, ID band Patient awake    Reviewed: Allergy & Precautions, NPO status , Patient's Chart, lab work & pertinent test results  History of Anesthesia Complications Negative for: history of anesthetic complications  Airway Mallampati: II  TM Distance: >3 FB     Dental  (+) Teeth Intact, Dental Advisory Given   Pulmonary    Pulmonary exam normal        Cardiovascular negative cardio ROS Normal cardiovascular exam     Neuro/Psych  Headaches, PSYCHIATRIC DISORDERS Anxiety Depression    GI/Hepatic negative GI ROS, Neg liver ROS,   Endo/Other  negative endocrine ROS  Renal/GU negative Renal ROS     Musculoskeletal   Abdominal   Peds  Hematology  (+) Sickle cell trait ,   Anesthesia Other Findings   Reproductive/Obstetrics                            Anesthesia Physical Anesthesia Plan  ASA: II  Anesthesia Plan: MAC   Post-op Pain Management:    Induction: Intravenous  Airway Management Planned: Simple Face Mask  Additional Equipment:   Intra-op Plan:   Post-operative Plan:   Informed Consent: I have reviewed the patients History and Physical, chart, labs and discussed the procedure including the risks, benefits and alternatives for the proposed anesthesia with the patient or authorized representative who has indicated his/her understanding and acceptance.   Dental advisory given  Plan Discussed with: CRNA, Anesthesiologist and Surgeon  Anesthesia Plan Comments:        Anesthesia Quick Evaluation

## 2015-01-02 NOTE — Discharge Instructions (Addendum)
Resume your regular diet Resume your medications Avoid NSAIDs Follow up for colonoscopy with Dr. Havery Moros as an outpatient to further evaluate rectal discomfort. Dr. Doyne Keel office will call you to schedule this Dr. Doyne Keel office will call you with results of esophageal and stomach biopsy results Start oral iron tablets Miralax as needed for constipation Please follow-up with an OB-Gyn to discuss ways to help your vaginal bleeding, such as endometrial ablation or a hysterectomy

## 2015-01-02 NOTE — Op Note (Signed)
[facility] [place], [address]   ENDOSCOPY PROCEDURE REPORT  PATIENT: Latoya, Guzman  MR#: [id number] BIRTHDATE: 03/29/1976 , 39  yrs. old GENDER: female ENDOSCOPIST: Elsmere Cellar, MD REFERRED BY: PROCEDURE DATE:  01/02/2015 PROCEDURE:  EGD w/ biopsy ASA CLASS:     Class III INDICATIONS:  39 y/o female with scant hematemesis in the setting of vomiting/heartburn/chest pain, and ongoing dysphagia. MEDICATIONS: Per Anesthesia TOPICAL ANESTHETIC:  DESCRIPTION OF PROCEDURE: After the risks benefits and alternatives of the procedure were thoroughly explained, informed consent was obtained.  The Pentax Gastroscope [id number] endoscope was introduced through the mouth and advanced to the second portion of the duodenum , Without limitations.  The instrument was slowly withdrawn as the mucosa was fully examined.    FINDINGS: The esophagus was normal in appearing.  No mucosal abnormalities. No stenosis or stricture appreciated.  Biopsies taken to rule out eosinophilic esophagitis.  DH, GEJ, and SCJ located 42cm from the incisors. The stomach was normal without ulceration or erosion. Biopsies taken to rule out H pylori.  The duodenal bulb and 2nd portion were normal.  No evidence of mucosal pathology. Retroflexed views revealed no abnormalities.     The scope was then withdrawn from the patient and the procedure completed.  COMPLICATIONS: There were no immediate complications.  ENDOSCOPIC IMPRESSION: Normal EGD. No pathology noted to account for the patient scant hematemesis, which I suspect is due to [employer] tear in the setting of vomiting that has likely since healed. No pathology to account for the patient's dysphagia, although biopsies taken to rule out EoE. Biopsies also taken to rule out H pylori in light of the patient's nausea.   RECOMMENDATIONS: Resume diet Resume medications Avoid NSAIDs Repeat CBC post-transfusion  and if stable, okay for discharge if symptoms controlled Oral iron supplementation for anemia Follow up for colonoscopy as outpatient with me to further evaluate rectal discomfort, the patient will be contacted to schedule this. Await pathology results, patient will be contacted with these results. Consider esophageal manometry to further evaluate dysphagia as outpatient. GYN consult for iron deficiency anemia, likely due to menses Miralax daily to treat constipation    eSigned:  Magalia Cellar, MD 01/02/2015 3:29 PM    CC:  PATIENT NAME:  Latoya Guzman, Latoya Guzman MR#: [id number]

## 2015-01-02 NOTE — Progress Notes (Signed)
Initial Nutrition Assessment  DOCUMENTATION CODES:   Not applicable  INTERVENTION:  None at this time   NUTRITION DIAGNOSIS:   Inadequate oral intake related to inability to eat as evidenced by NPO status.    GOAL:   Patient will meet greater than or equal to 90% of their needs    MONITOR:   PO intake, Diet advancement, I & O's, Labs  REASON FOR ASSESSMENT:   Malnutrition Screening Tool    ASSESSMENT:   Pt is 39 yo female pmh of iron deficiency anemia with PICA, positive antiphospholipid antibodiy, mitral and tricuspid valve regurgitation, sickle cell. Presents with chest pain, bloody emesis. NFPE found pt to be well-nourised all around. Pt reports no previous nausea, vomitting, or barriers to nutrition. States she doesn't eat meals at home, usually kind of snacks throughout the day. No indication of wt loss, or wt loss repoted.  Follow for diet advancement.  Diet Order:  Diet NPO time specified  Skin:  Reviewed, no issues  Last BM:  01/01/2015  Height:   Ht Readings from Last 1 Encounters:  01/01/15 5\' 11"  (1.803 m)    Weight:   Wt Readings from Last 1 Encounters:  01/01/15 235 lb (106.595 kg)    Ideal Body Weight:  70.45 kg  BMI:  Body mass index is 32.79 kg/(m^2).  Estimated Nutritional Needs:   Kcal:  1800-2000  Protein:  80-90g  Fluid:  >=/1.8L  EDUCATION NEEDS:   No education needs identified at this time  Satira Anis. Rasheem Figiel, MS, RD LDN After Hours/Weekend Pager [phone]

## 2015-01-02 NOTE — Care Management Note (Signed)
Case Management Note  Patient Details  Name: Latoya Guzman MRN: [id number] Date of Birth: 10-21-1975  Subjective/Objective:                 Patient from home with spouse and children, independent, self care. Medicaid pending, has PCP. No CM needs at this time, will continue to follow and offer resources as needed.    Action/Plan:   Expected Discharge Date:                  Expected Discharge Plan:  Home/Self Care  In-House Referral:     Discharge planning Services  CM Consult  Post Acute Care Choice:    Choice offered to:     DME Arranged:    DME Agency:     HH Arranged:    HH Agency:     Status of Service:  In process, will continue to follow  Medicare Important Message Given:    Date Medicare IM Given:    Medicare IM give by:    Date Additional Medicare IM Given:    Additional Medicare Important Message give by:     If discussed at [place] of Stay Meetings, dates discussed:    Additional Comments:  Carles Collet, RN 01/02/2015, 4:26 PM

## 2015-01-02 NOTE — Progress Notes (Signed)
Family Medicine Teaching Service Daily Progress Note Intern Pager: [phone]  Patient name: Latoya Guzman Medical record number: [id number] Date of birth: 1975-09-15 Age: 39 y.o. Gender: female  Primary Care Provider: Clearance Coots, MD Consultants: Gastroenterology Code Status: FULL  Pt Overview and Major Events to Date:  None  Assessment and Plan: Latoya Guzman is a 39 y.o. female presenting with nausea, non-productive cough with post-tussive emesis, chest pain, and one episode of hematemesis. PMH is significant for iron deficiency anemia with PICA, positive antiphospholipid antibody, mitral and tricuspid valve regurgitation, and sickle cell trait.  Nausea, Cough with Post-Tussive Emesis, Chest Pain: Patient's complaints of progressive nausea, cough with post-tussive emesis, chest pain, and single episode of hematemesis with history of GERD are of likely gastrointestinal etiology. Differential diagnosis includes esophagitis secondary to long-standing GERD, gastritis, gastroduodenal ulcer, and vascular lesion. Hematemesis also possibly attributable to Mallory-Weis tear or from esophageal irritation/injury following repeated emesis. She does have a history of mild mitral and tricupsid regurgitation; however, ACS is less likely in this patient with atypical chest pain, extended time course, intermittent nature of pain, EKG without abnormality, and negative troponin x 2. Patient saturating well on room air and without current shortness of breath, making pulmonary embolism a less likely diagnosis; however, with some tachycardia on admission and history of lupus anticoagulant. She is also without leukocytosis or fever, making acute infection less likely. Hgb on admission was 7.2, dropped to 6.8 overnight so was given 2U pRBCs. - GI consulted, appreciate recommendations - Per their recommendation, EGD today. May eventually need flexible sigmoidoscopy vs. full colonoscopy after resolution of upper  GI symptoms. - Admit to telemetry - Troponins neg x 2, will f/u on 3rd - Repeat EKG (9/8): Sinus bradycardia - D-dimer: nl - FOBT: neg - Coags nl- PT 14.6, INR 1.12, PTT 31 - Protonix 40 mg IV daily - Zofran 4 mg IV q6h PRN  Microcytic Anemia: History of iron deficiency anemia exacerbated by dysfunctional uterine bleeding 2/2 fibroids and adenomyosis (2013 transvaginal US). Iron deficiency anemia with PICA since elementary school, previously managed with outpatient iron supplementation; however, she reports that she discontinued iron supplementation >1 year ago due to GI upset. Baseline hemoglobin appears to be around 8.0, as low as 6.1 in 09/2012 , today 7.2 with MCV 56. She has previously undergone transfusion of pRBCs and IV iron with a symptomatic anemia at hemoglobin of 7.1; previous presentation was chest pain. Current chest pain and dyspnea on exertion could certainly be representative of symptomatic anemia. She agrees to accepting transfusion again this hospitalization if necessary. - s/p 2U pRBCs after Hgb of 6.8 overnight - Iron studies: ferritin 2, TIBC 444, Fe 10 - IV Iron 510mg  qd - Consider endometrial ablation vs hysterectomy as outpatient  Anti-Cardiolipin Antibody Positive: History of lupus anticoagulant, off Warfarin since 2012. She has previously been referred to Rheumatology; however she has not followed up on this. - Will encourage outpatient follow up with Rheumatology.  FEN/GI: NPO, D5 1/2NS @ 100cc/hr PPx: SCDs  Disposition: Home pending clinical improvement  Subjective:  Had emesis x 1, but does not know if there was blood in it because she didn't look. She tolerated the blood transfusion without any issues. She notes some substernal chest pain that felt like a "bubble" in her chest. She also mentioned this morning that she had a complication with anesthesia when getting her wisdom teeth out. They stopped the anesthesia immediately and never performed the surgery. She  was sent to a  cardiologist at that point. She saw the cardiologist once and then never followed up.  Objective: Temp:  [97.3 F (36.3 C)-98.5 F (36.9 C)] 98.5 F (36.9 C) (09/08 0630) Pulse Rate:  [61-92] 61 (09/08 0630) Resp:  [16-35] 16 (09/08 0630) BP: (98-134)/(46-81) 134/71 mmHg (09/08 0630) SpO2:  [99 %-100 %] 100 % (09/08 0630) Weight:  [235 lb (106.595 kg)] 235 lb (106.595 kg) (09/07 1512) Physical Exam: General: Well-appearing, pleasant and conversational, laying in bed, in NAD HEENT: [place]/AT. No scleral icterus. Conjunctival pallor present. MMM. Cardiovascular: RRR. No m/r/g. Radial and DP pulses 2+ bilaterally. Respiratory: Normal work of breathing on room air. CTAB, no wheezes or crackles. Abdomen: Soft and non-distended without palpable masses or hepatosplenomegaly. No TTP of abdomen. Neurological: Alert and oriented. EOMI. Muscle tone grossly normal. Psych: Pleasant with appropriate mood and affect. Skin: No rashes   Laboratory:  Recent Labs Lab 01/01/15 1122 01/01/15 1939  WBC 4.7 5.7  HGB 7.2* 6.8*  HCT 24.1* 22.8*  PLT 280 266    Recent Labs Lab 01/01/15 1122  NA 138  K 3.8  CL 108  CO2 24  BUN 9  CREATININE 0.96  CALCIUM 9.1  PROT 6.8  BILITOT 0.4  ALKPHOS 70  ALT 23  AST 29  GLUCOSE 107*    Imaging/Diagnostic Tests: EKG 9/8: sinus bradycardia CXR 9/7: No active cardiopulmonary disease, heart size is upper limit of normal.  Sela Hua, MD 01/02/2015, 7:28 AM PGY-1, [place] Intern pager: [phone], text pages welcome

## 2015-01-02 NOTE — Transfer of Care (Signed)
Immediate Anesthesia Transfer of Care Note  Patient: Latoya Guzman  Procedure(s) Performed: Procedure(s): ESOPHAGOGASTRODUODENOSCOPY (EGD) (N/A)  Patient Location: PACU  Anesthesia Type:MAC  Level of Consciousness: sedated, patient cooperative and responds to stimulation  Airway & Oxygen Therapy: Patient Spontanous Breathing and Patient connected to nasal cannula oxygen  Post-op Assessment: Report given to RN, Post -op Vital signs reviewed and stable and Patient moving all extremities X 4  Post vital signs: Reviewed and stable  Last Vitals:  Filed Vitals:   01/02/15 1525  BP:   Pulse:   Temp: 36.8 C  Resp:     Complications: No apparent anesthesia complications

## 2015-01-03 LAB — TYPE AND SCREEN
ABO/RH(D): B POS
ANTIBODY SCREEN: NEGATIVE
Unit division: 0
Unit division: 0

## 2015-01-03 NOTE — Discharge Summary (Signed)
[facility] Discharge Summary  Patient name: Latoya Guzman Medical record number: [id number] Date of birth: 05/03/1975 Age: 39 y.o. Gender: female Date of Admission: 01/01/2015  Date of Discharge: 01/02/15 Admitting Physician: Zenia Resides, MD  Primary Care Provider: Clearance Coots, MD Consultants: Gastroenterology  Indication for Hospitalization: ACS rule out, symptomatic anemia  Discharge Diagnoses/Problem List:  Chest Pain Iron deficiency anemia Dyspnea on exertion Hematemesis Dysphagia  Disposition: Home  Discharge Condition: Stable, improved  Discharge Exam:  General: Well-appearing, pleasant and conversational, laying in bed, in NAD HEENT: [place]/AT. No scleral icterus. Conjunctival pallor present. MMM. Cardiovascular: RRR. No m/r/g. Radial and DP pulses 2+ bilaterally. Respiratory: Normal work of breathing on room air. CTAB, no wheezes or crackles. Abdomen: Soft and non-distended without palpable masses or hepatosplenomegaly. No TTP of abdomen. Neurological: Alert and oriented. EOMI. Muscle tone grossly normal. Psych: Pleasant with appropriate mood and affect. Skin: No rashes   Brief Hospital Course:  Latoya Guzman is a 39 year old female with a PMH of iron deficiency anemia and positive lupus anticoagulant who presented to the ED with 2 weeks of worsening nausea, hematemesis x 1, and chest pain associated with exertional dyspnea. She also endorsed the sensation of food getting stuck in her esophagus and said she felt like she had a "bubble" underneath her sternum. The chest pain was non-exertional, non-positional, non-pleuritic with substernal chest pain that radiated to her left arm. She had been fatigued and short of breath just walking around the house. She denied BRBPR, melena, fevers, weight loss, orthopnea, LE swelling, or palpitations. Labs in the ED were significant for Hgb 7.2, Hct 24.1, MCV 56.2, Plt 280, PT 14.6, INR 1.12. Troponin neg  x 2. Upreg negative. EKG showed normal sinus rhythm. CXR was normal. FOBT was negative. She was admitted for ACS rule out and management of her symptomatic anemia. Hospital course is described by problem below.   1. Hematemesis/Dysphagia: Pt had emesis with three silver dollar-sized clots of blood on the day of admission. We thought this was likely a Mallory-Weiss tear in the setting of prolonged vomiting vs gastritis/esophagitis/PUD. She was started on an IV PPI and anti-emetic. Gastroenterology was consulted and she received an EGD on 9/8. EGD was normal. Gastroenterology recommended that she avoid NSAIDs, take an oral iron supplement, and follow-up with GI as an outpatient. Pt also endorsed some pain in her bottom "like a labor contraction" for the last year. This will also be followed up by GI as an outpatient. She will likely receive a colonoscopy. She had emesis x 1 while she was hospitalized, but she did not look to see if there was blood in the emesis.  2. Severe iron deficiency anemia with pica: Pt has long-standing iron deficiency anemia likely resulting from dysfunctional uterine bleeding. Baseline Hgb is around 8.0. She reports a history of eating toilet paper and dirt since elementary school. Repeat CBC was performed and she was found to have a Hgb of 6.8. Given her chest pain and dyspnea on exertion, she was given 2 units pRBCs on 9/8. Post-tranfusion H/H was 9.0/29.4. Iron studies were significant for Iron 10, Ferritin 2, TIBC 444. She was started on IV Iron. On the day of discharge, she was transitioned to oral ferrous sulfate 325mg  and Miralax qd.   3. Chest pain and dyspnea on exertion: On admission, there was some concern for ACS. She had an ECHO stress test in 2011, which was negative. EKG showed normal sinus rhythm and troponins  were negative x 2. Given her history of a positive lupus anti-coagulant in the setting of dyspnea, chest pain, and tachycardia on presentation we thought about  possible PE; however, D-dimer was normal, her HRs responded to IVFs, and she did not have any EKG changes indicative of PE. She also saturated well on room air. We thought her chest pain was likely gastrointestinal in etiology, given her hx of GERD and feeling of a "bubble" beneath her sternum.   4. Hx of positive lupus anticoagulant:  She was previously treated with Warfarin, but has not taken this since 2012. She should follow up with Rheumatology.  Issues for Follow Up:  1. Pt should follow-up with GI as an outpatient for colonoscopy to evaluate her anal/rectal discomfort. She may also get esophageal manometry to further evaluate her dysphagia. GI office will contact her to schedule this.  2. Pt should follow-up with OB/Gyn to discuss her dysfunctional uterine bleeding, which is causing severe iron deficiency anemia. She was given information about endometrial ablations and hysterectomies while in the hospital.  3. Pt has a history of positive lupus anticoagulant. She has been on Warfarin in the past but has not taken this since 2012. She should follow-up with Rheumatology as an outpatient.  Significant Procedures: EGD performed on 9/8 and was normal.  Significant Labs and Imaging:   Recent Labs Lab 01/01/15 1122 01/01/15 1939 01/02/15 1637  WBC 4.7 5.7 5.6  HGB 7.2* 6.8* 9.0*  HCT 24.1* 22.8* 29.4*  PLT 280 266 240    Recent Labs Lab 01/01/15 1122  NA 138  K 3.8  CL 108  CO2 24  GLUCOSE 107*  BUN 9  CREATININE 0.96  CALCIUM 9.1  ALKPHOS 70  AST 29  ALT 23  ALBUMIN 3.6    Results/Tests Pending at Time of Discharge: None  Discharge Medications:    Medication List    TAKE these medications        ferrous sulfate 325 (65 FE) MG tablet  Commonly known as:  FERROUSUL  Take 1 tablet (325 mg total) by mouth 2 (two) times daily after a meal.     polyethylene glycol packet  Commonly known as:  MIRALAX / GLYCOLAX  Take 17 g by mouth daily.        Discharge  Instructions: Please refer to Patient Instructions section of EMR for full details.  Patient was counseled important signs and symptoms that should prompt return to medical care, changes in medications, dietary instructions, activity restrictions, and follow up appointments.   Follow-Up Appointments:     Follow-up Information    Follow up with [facility].   Why:  Sleep study center will call to schedule your sleep study.    Contact information:   [place]       Follow up with Manus Gunning, MD.   Specialty:  Gastroenterology   Why:  Their office will call you to schedule an appointment   Contact information:   [address]  [phone]       Follow up with OB-GYN.   Why:  Please schedule an appt with an Bushnell, MD 01/03/2015, 4:55 PM PGY-1, [place]

## 2015-01-06 ENCOUNTER — Emergency Department (HOSPITAL_COMMUNITY): Payer: Medicaid Other

## 2015-01-06 ENCOUNTER — Emergency Department (HOSPITAL_COMMUNITY)
Admission: EM | Admit: 2015-01-06 | Discharge: 2015-01-07 | Disposition: A | Discharge status: Home / Self Care | Payer: Medicaid Other | Attending: Emergency Medicine | Admitting: Emergency Medicine

## 2015-01-06 ENCOUNTER — Encounter (HOSPITAL_COMMUNITY): Payer: Self-pay | Admitting: Emergency Medicine

## 2015-01-06 ENCOUNTER — Other Ambulatory Visit: Payer: Self-pay | Admitting: *Deleted

## 2015-01-06 DIAGNOSIS — Z8751 Personal history of pre-term labor: Secondary | ICD-10-CM | POA: Insufficient documentation

## 2015-01-06 DIAGNOSIS — R079 Chest pain, unspecified: Secondary | ICD-10-CM | POA: Diagnosis present

## 2015-01-06 DIAGNOSIS — F5089 Other specified eating disorder: Secondary | ICD-10-CM

## 2015-01-06 DIAGNOSIS — Z3202 Encounter for pregnancy test, result negative: Secondary | ICD-10-CM | POA: Diagnosis not present

## 2015-01-06 DIAGNOSIS — F329 Major depressive disorder, single episode, unspecified: Secondary | ICD-10-CM | POA: Diagnosis not present

## 2015-01-06 DIAGNOSIS — Z8709 Personal history of other diseases of the respiratory system: Secondary | ICD-10-CM | POA: Diagnosis not present

## 2015-01-06 DIAGNOSIS — Z79899 Other long term (current) drug therapy: Secondary | ICD-10-CM | POA: Insufficient documentation

## 2015-01-06 DIAGNOSIS — Q233 Congenital mitral insufficiency: Secondary | ICD-10-CM | POA: Diagnosis not present

## 2015-01-06 DIAGNOSIS — Z8739 Personal history of other diseases of the musculoskeletal system and connective tissue: Secondary | ICD-10-CM | POA: Diagnosis not present

## 2015-01-06 DIAGNOSIS — Z86018 Personal history of other benign neoplasm: Secondary | ICD-10-CM | POA: Diagnosis not present

## 2015-01-06 DIAGNOSIS — D649 Anemia, unspecified: Secondary | ICD-10-CM | POA: Diagnosis not present

## 2015-01-06 DIAGNOSIS — D509 Iron deficiency anemia, unspecified: Secondary | ICD-10-CM

## 2015-01-06 DIAGNOSIS — F508 Other eating disorders: Secondary | ICD-10-CM | POA: Insufficient documentation

## 2015-01-06 HISTORY — DX: Other specified eating disorder: F50.89

## 2015-01-06 LAB — BASIC METABOLIC PANEL
Anion gap: 7 (ref 5–15)
BUN: 13 mg/dL (ref 6–20)
CHLORIDE: 105 mmol/L (ref 101–111)
CO2: 23 mmol/L (ref 22–32)
Calcium: 8.8 mg/dL — ABNORMAL LOW (ref 8.9–10.3)
Creatinine, Ser: 1.02 mg/dL — ABNORMAL HIGH (ref 0.44–1.00)
GFR calc Af Amer: >60 mL/min (ref >60)
GFR calc non Af Amer: >60 mL/min (ref >60)
Glucose, Bld: 103 mg/dL — ABNORMAL HIGH (ref 65–99)
POTASSIUM: 3.7 mmol/L (ref 3.5–5.1)
SODIUM: 135 mmol/L (ref 135–145)

## 2015-01-06 LAB — URINALYSIS, ROUTINE W REFLEX MICROSCOPIC
BILIRUBIN URINE: NEGATIVE
Glucose, UA: NEGATIVE mg/dL
Hgb urine dipstick: NEGATIVE
Ketones, ur: NEGATIVE mg/dL
LEUKOCYTES UA: NEGATIVE
NITRITE: NEGATIVE
PH: 7.5 (ref 5.0–8.0)
Protein, ur: NEGATIVE mg/dL
SPECIFIC GRAVITY, URINE: 1.018 (ref 1.005–1.030)
Urobilinogen, UA: 1 mg/dL (ref 0.0–1.0)

## 2015-01-06 LAB — CBC
HEMATOCRIT: 30.5 % — AB (ref 36.0–46.0)
Hemoglobin: 9.5 g/dL — ABNORMAL LOW (ref 12.0–15.0)
MCH: 19.6 pg — ABNORMAL LOW (ref 26.0–34.0)
MCHC: 31.1 g/dL (ref 30.0–36.0)
MCV: 63 fL — AB (ref 78.0–100.0)
Platelets: 277 10*3/uL (ref 150–400)
RBC: 4.84 MIL/uL (ref 3.87–5.11)
RDW: 27.3 % — AB (ref 11.5–15.5)
WBC: 6.9 10*3/uL (ref 4.0–10.5)

## 2015-01-06 LAB — POC URINE PREG, ED: Preg Test, Ur: NEGATIVE

## 2015-01-06 NOTE — ED Notes (Signed)
Pt arrives via EMS with c/o L sided chest pain starting today. Pt recently discharged and states that she has feel increasingly weak since then. States pain has already decreased since being picked up by EMS.

## 2015-01-06 NOTE — ED Provider Notes (Signed)
CSN: [id number]     Arrival date & time 01/06/15  1954 History  This chart was scribed for Latoya Balls, MD by Randa Evens, ED Scribe. This patient was seen in room A08C/A08C and the patient's care was started at 10:59 PM.    Chief Complaint  Patient presents with  . Chest Pain  . Fatigue   Patient is a 39 y.o. female presenting with chest pain. The history is provided by the patient. No language interpreter was used.  Chest Pain Pain quality comment:  Squeezing Pain radiates to the back: no   Pain severity:  Moderate Onset quality:  Sudden Duration:  1 day Chronicity:  Recurrent Context comment:  Walking Associated symptoms: nausea, shortness of breath, vomiting and weakness    HPI Comments: Latoya Guzman is a 39 y.o. female who presents to the Emergency Department complaining of recurrent squeezing left sided CP onset today. Pt states during the onset of pain she was walking to the store to get some iron pills. She states that while walking to the store she started to feel weak but wanted to continue on to the store. Pt reports assocaited SOB, vomiting and diaphoresis. She states that today she also had intermittent shakes during the onset of pain. Pt states that shakes where localized to her hands. No LOC.  Pt states that she has not been complaint with taking her iron supplements. Pt states that her symptoms where similar last time she was in the ED and she had to receive 2 blood transfusions.  She states she felt mildly better upon DC but now is back to her initial state.  She has a fu appt in the next week.   Past Medical History  Diagnosis Date  . Blood transfusion 1998; 2015    Post C/S; related to anemia  . Depression     Was on Lexapro Stopped when pregnant  . Preterm labor     PTL last two pregnancies  . Fibroid 03/2010    Noted on [place] in Dec.  . 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 Tricuspid valve  regurgitation 02/27/2010  . Lupus Lupus Antigen     associated with pregnancy   . Sickle cell trait   . Anemia 12/19/2012  . Antiphospholipid antibody positive   . Chronic bronchitis   . Headache     "weekly" (01/01/2015)  . Anxiety   . Pica     toilet tissue   Past Surgical History  Procedure Laterality Date  . Cesarean section  1998, 2009  . Tubal ligation  11/14/2010  . Cesarean section  11/14/2010    Procedure: CESAREAN SECTION;  Surgeon: Juliene Pina C. Hulan Fray, MD;  Location: [facility];  Service: Gynecology;  Laterality: N/A;  Repeat cesarean section with delivery of baby boy at 38. Apgars 9/9. Bilateral tubal ligation with filshie clips.   . Dilation and curettage of uterus  2005, 2006    1st for twin loss, 2nd for retained placenta   Family History  Problem Relation Age of Onset  . Hypertension Mother   . Lupus Sister   . Diabetes Maternal Uncle   . Cancer Maternal Grandmother    Social History  Substance Use Topics  . Smoking status: Never Smoker   . Smokeless tobacco: Never Used  . Alcohol Use: Yes     Comment: 01/01/2015 "might have a couple drinks/year"   OB History    Gravida Para Term  Preterm AB TAB SAB Ectopic Multiple Living   10 8 6 2 2  2  1 7      Review of Systems  Respiratory: Positive for shortness of breath.   Cardiovascular: Positive for chest pain.  Gastrointestinal: Positive for nausea and vomiting.  Neurological: Positive for weakness.  All other systems reviewed and are negative.    Allergies  Review of patient's allergies indicates no known allergies.  Home Medications   Prior to Admission medications   Medication Sig Start Date End Date Taking? Authorizing Provider  ferrous sulfate (FERROUSUL) 325 (65 FE) MG tablet Take 1 tablet (325 mg total) by mouth 2 (two) times daily after a meal. 07/10/13  Yes Rosemarie Ax, MD  polyethylene glycol (MIRALAX / GLYCOLAX) packet Take 17 g by mouth daily. 01/02/15  Yes Sela Hua, MD   BP 104/72 mmHg  Pulse  58  Temp(Src) 98.1 F (36.7 C) (Oral)  Resp 13  SpO2 99%  LMP 12/01/2014 (Approximate)   Physical Exam  Constitutional: She is oriented to person, place, and time. She appears well-developed and well-nourished. No distress.  HENT:  Head: Normocephalic and atraumatic.  Nose: Nose normal.  Mouth/Throat: Oropharynx is clear and moist. No oropharyngeal exudate.  Eyes: Conjunctivae and EOM are normal. Pupils are equal, round, and reactive to light. No scleral icterus.  Neck: Normal range of motion. Neck supple. No JVD present. No tracheal deviation present. No thyromegaly present.  Cardiovascular: Normal rate, regular rhythm and normal heart sounds.  Exam reveals no gallop and no friction rub.   No murmur heard. Pulmonary/Chest: Effort normal and breath sounds normal. No respiratory distress. She has no wheezes. She exhibits no tenderness.  Abdominal: Soft. Bowel sounds are normal. She exhibits no distension and no mass. There is no tenderness. There is no rebound and no guarding.  Musculoskeletal: Normal range of motion. She exhibits no edema or tenderness.  Lymphadenopathy:    She has no cervical adenopathy.  Neurological: She is alert and oriented to person, place, and time. No cranial nerve deficit. She exhibits normal muscle tone.  Skin: Skin is warm and dry. No rash noted. No erythema. No pallor.  Nursing note and vitals reviewed.   ED Course  Procedures (including critical care time) DIAGNOSTIC STUDIES: Oxygen Saturation is 99% on RA, normal by my interpretation.    COORDINATION OF CARE: 11:44 PM-Discussed treatment plan with pt at bedside and pt agreed to plan.     Labs Review Labs Reviewed  BASIC METABOLIC PANEL - Abnormal; Notable for the following:    Glucose, Bld 103 (*)    Creatinine, Ser 1.02 (*)    Calcium 8.8 (*)    All other components within normal limits  CBC - Abnormal; Notable for the following:    Hemoglobin 9.5 (*)    HCT 30.5 (*)    MCV 63.0 (*)     MCH 19.6 (*)    RDW 27.3 (*)    All other components within normal limits  URINALYSIS, ROUTINE W REFLEX MICROSCOPIC (NOT AT [facility])  POC URINE PREG, ED    Imaging Review Dg Chest 2 View  01/06/2015   CLINICAL DATA:  EMS with left-sided chest pain which started today. Recently discharged 1 week ago. Feels increasingly weak since discharge.  EXAM: CHEST  2 VIEW  COMPARISON:  01/01/2015  FINDINGS: The heart size and mediastinal contours are within normal limits. Both lungs are clear. The visualized skeletal structures are unremarkable.  IMPRESSION: No active cardiopulmonary disease.  Electronically Signed   By: Nolon Nations M.D.   On: 01/06/2015 20:35     EKG Interpretation   Date/Time:  Monday January 06 2015 20:03:19 EDT Ventricular Rate:  72 PR Interval:  139 QRS Duration: 90 QT Interval:  400 QTC Calculation: 438 R Axis:   89 Text Interpretation:  Sinus rhythm Consider left ventricular hypertrophy  No significant change since last tracing Confirmed by ZACKOWSKI  MD, SCOTT  [phone]) on 01/06/2015 8:15:40 PM      MDM   Final diagnoses:  Pica   Patient presents to the ED for chest pain and weakness.  She was just admitted for anemia and her hgb is now 9.5.  Her history is not consistent with ACS and she is PERC negative.  I do not believe the patient requires a repeat admission work up.  She was given iron in the ED and will be DC to her outpatient appointment.  Strict return precautions given.  She appears well and in NAD, her VS remain within her normal limits and she is safe for DC.   I personally performed the services described in this documentation, which was scribed in my presence. The recorded information has been reviewed and is accurate.    Latoya Balls, MD 01/07/15 [phone]

## 2015-01-07 ENCOUNTER — Encounter (HOSPITAL_COMMUNITY): Payer: Self-pay | Admitting: Gastroenterology

## 2015-01-07 MED ORDER — FERROUS SULFATE 325 (65 FE) MG PO TABS
325.0000 mg | ORAL_TABLET | Freq: Once | ORAL | Status: AC
  Administered 2015-01-07: 325 mg via ORAL
  Filled 2015-01-07: qty 1

## 2015-01-07 NOTE — Discharge Instructions (Signed)
Weakness Ms. Colan, Your blood work today show a blood count of 9.5 which is high for you.  See your GI doctor within 3 days for close follow up of your symptoms. If symptoms worsen, come back to the ED immediately.  Thank you. Weakness is a lack of strength. You may feel weak all over your body or just in one part of your body. Weakness can be serious. In some cases, you may need more medical tests. HOME [place] a well-balanced diet.  Try to exercise every day.  Only take medicines as told by your doctor. GET HELP RIGHT AWAY IF:   You cannot do your normal daily activities.  You cannot walk up and down stairs, or you feel very tired when you do so.  You have shortness of breath or chest pain.  You have trouble moving parts of your body.  You have weakness in only one body part or on only one side of the body.  You have a fever.  You have trouble speaking or swallowing.  You cannot control when you pee (urinate) or poop (bowel movement).  You have black or bloody throw up (vomit) or poop.  Your weakness gets worse or spreads to other body parts.  You have new aches or pains. MAKE SURE YOU:   Understand these instructions.  Will watch your condition.  Will get help right away if you are not doing well or get worse. Document Released: 03/25/2008 Document Revised: 10/12/2011 Document Reviewed: 06/11/2011 [facility] Patient Information 2015 [place], [place]. This information is not intended to replace advice given to you by your health care provider. Make sure you discuss any questions you have with your health care provider.

## 2015-01-09 ENCOUNTER — Ambulatory Visit (INDEPENDENT_AMBULATORY_CARE_PROVIDER_SITE_OTHER): Payer: Self-pay | Admitting: Family Medicine

## 2015-01-09 VITALS — BP 122/59 | HR 70 | Temp 98.2°F | Ht 71.0 in | Wt 231.2 lb

## 2015-01-09 DIAGNOSIS — D509 Iron deficiency anemia, unspecified: Secondary | ICD-10-CM

## 2015-01-09 DIAGNOSIS — N920 Excessive and frequent menstruation with regular cycle: Secondary | ICD-10-CM | POA: Insufficient documentation

## 2015-01-09 DIAGNOSIS — Z23 Encounter for immunization: Secondary | ICD-10-CM

## 2015-01-09 DIAGNOSIS — N921 Excessive and frequent menstruation with irregular cycle: Secondary | ICD-10-CM

## 2015-01-09 DIAGNOSIS — R760 Raised antibody titer: Secondary | ICD-10-CM

## 2015-01-09 DIAGNOSIS — R072 Precordial pain: Secondary | ICD-10-CM

## 2015-01-09 DIAGNOSIS — K920 Hematemesis: Secondary | ICD-10-CM

## 2015-01-09 DIAGNOSIS — R011 Cardiac murmur, unspecified: Secondary | ICD-10-CM | POA: Insufficient documentation

## 2015-01-09 DIAGNOSIS — R110 Nausea: Secondary | ICD-10-CM

## 2015-01-09 NOTE — Patient Instructions (Addendum)
See the gynecologist.  That is the most important problem to take care of. Get the echocardiogram done.  I need to know whether you need antibiotics prior to certain procedures.   Once you have had the vaginal bleeding taken care of, you should start an 81mg  aspirin every day.   See [place] in 3-6 weeks to recheck blood count and let us know about the gyn visit.

## 2015-01-09 NOTE — Assessment & Plan Note (Addendum)
I would start an aspirin daily after she has the endometrial ablation.  Per my review, no indication for full anticoagulation unless has documented PE or DVT.  In fact current menorrhagia and anemia are reasons to avoid anticoag.

## 2015-01-10 ENCOUNTER — Encounter: Payer: Self-pay | Admitting: Family Medicine

## 2015-01-10 NOTE — Progress Notes (Signed)
   Subjective:    Patient ID: Latoya Guzman, female    DOB: 14-Mar-1976, 39 y.o.   MRN: [id number]  HPI  Complicated hospital follow up. 1.Anemia secondary to menorrhagia. Also had an episode of hematemesis. Seen back in the ER with chest discomfort and CBC was stable two days ago. 2. Chest pain.  Unclear etiology.  It is a midline lower chest/epigastric fullness.  No typical features of angina. 3. "I have two leaky heart valves."  Told so by cards in 2011 - perhaps based on an office echo.  Has upcoming colonoscopy and likely GYN procedure.  Will need to know about SBE prophylaxis. 4. Menorrhagia.  Not bleeding now.  Very symptomatic and has needed repeated transfusions/parenteral iron.  Did receive both blood and iron in recent hospitalization.  Failed prior hormonal manipulation.  Needs definitive procedure. 5. Lupus anticoagulant positive.  Has had fetal loss.  Was on anticoag with pregnancies.  Never had any documented blood clot. 6. GI, EGD normal in hospital.  H pylori neg.  Has GI followup and planned colonoscopy - presumably for a combo of rectal pressure and anemia.   Review of Systems     Objective:   Physical Exam  Conjunctiva normal (not pale) Lungs clear Cardiac RRR with 2/6 SEM, no gallup or rub Ext no edema.       Assessment & Plan:

## 2015-01-10 NOTE — Assessment & Plan Note (Signed)
Echocardiogram ordered because I need to know about need for SBE prophylaxis.  Per exam, the murmur could easily be a flow murmur from anemia.

## 2015-01-10 NOTE — Assessment & Plan Note (Signed)
Refer to gyn for definitive procedure.  Endometrial ablation?

## 2015-01-10 NOTE — Assessment & Plan Note (Signed)
Stable from menorrhagia.  Needs gyn referral

## 2015-01-10 NOTE — Assessment & Plan Note (Signed)
Non cardiac.  This is a back burner issue for me.

## 2015-01-10 NOTE — Assessment & Plan Note (Signed)
No further nausea or vomiting.

## 2015-01-14 ENCOUNTER — Encounter: Payer: Self-pay | Admitting: *Deleted

## 2015-01-14 ENCOUNTER — Encounter: Payer: Self-pay | Admitting: Obstetrics & Gynecology

## 2015-01-14 NOTE — Progress Notes (Unsigned)
Patient ID: Latoya Guzman, female   DOB: [birth year], 39 y.o.   MRN: [id number] Pt's medical hx reviewed by J Monday CRNA- ok for [place] for procedure

## 2015-01-15 ENCOUNTER — Other Ambulatory Visit (HOSPITAL_COMMUNITY): Payer: Medicaid Other

## 2015-01-17 ENCOUNTER — Ambulatory Visit (AMBULATORY_SURGERY_CENTER): Payer: Self-pay | Admitting: *Deleted

## 2015-01-17 VITALS — Ht 71.0 in | Wt 235.4 lb

## 2015-01-17 DIAGNOSIS — D509 Iron deficiency anemia, unspecified: Secondary | ICD-10-CM

## 2015-01-17 MED ORDER — NA SULFATE-K SULFATE-MG SULF 17.5-3.13-1.6 GM/177ML PO SOLN: 1.0000 | Freq: Once | ORAL | Status: DC

## 2015-01-17 NOTE — Progress Notes (Signed)
Denies allergies to eggs or soy products. Denies complications with sedation or anesthesia. Denies O2 use. Denies use of diet or weight loss medications.  Emmi instructions given for colonoscopy.  

## 2015-01-21 ENCOUNTER — Ambulatory Visit (HOSPITAL_COMMUNITY): Payer: Medicaid Other | Attending: Cardiology

## 2015-01-21 ENCOUNTER — Other Ambulatory Visit: Payer: Self-pay

## 2015-01-21 ENCOUNTER — Encounter: Payer: Self-pay | Admitting: Family Medicine

## 2015-01-21 DIAGNOSIS — R011 Cardiac murmur, unspecified: Secondary | ICD-10-CM | POA: Diagnosis not present

## 2015-01-21 DIAGNOSIS — I371 Nonrheumatic pulmonary valve insufficiency: Secondary | ICD-10-CM | POA: Insufficient documentation

## 2015-01-21 DIAGNOSIS — I071 Rheumatic tricuspid insufficiency: Secondary | ICD-10-CM | POA: Insufficient documentation

## 2015-01-28 ENCOUNTER — Telehealth: Payer: Self-pay | Admitting: Family Medicine

## 2015-01-28 NOTE — Telephone Encounter (Signed)
Eye is twitching and right hand is shaking. Should she have this checked out before her colonoscopy tomorrow? Please advise

## 2015-01-29 ENCOUNTER — Encounter: Payer: Medicaid Other | Admitting: Gastroenterology

## 2015-01-29 NOTE — Telephone Encounter (Signed)
I just saw this message this morning. The patient had already cancelled her procedure. Can you assist in rescheduling her? thanks

## 2015-01-30 ENCOUNTER — Ambulatory Visit: Payer: Medicaid Other | Admitting: Obstetrics & Gynecology

## 2015-01-30 ENCOUNTER — Telehealth: Payer: Self-pay | Admitting: Obstetrics & Gynecology

## 2015-01-30 NOTE — Telephone Encounter (Signed)
Called patient to make sure she was still coming to her appointment. She stated she was coming at 4:00. I asked her to please remember to bring her insurance card along with a $3.00 co-pay. She said she didn't have any money. I informed her that because she has a balance, we would have to reschedule her. She wanted to know why, and what was the balance for. I told her she needed to call patient accounting, and get an itemized statement. She then hung the phone up. However, I did reschedule her appointment.

## 2015-01-31 NOTE — Telephone Encounter (Signed)
Left message on voicemail to call back and Reschedule Procedure.[email or web address]

## 2015-02-17 ENCOUNTER — Ambulatory Visit (INDEPENDENT_AMBULATORY_CARE_PROVIDER_SITE_OTHER): Payer: Medicaid Other | Admitting: Medical

## 2015-02-17 ENCOUNTER — Encounter: Payer: Self-pay | Admitting: Medical

## 2015-02-17 VITALS — BP 127/69 | HR 77 | Wt 241.0 lb

## 2015-02-17 DIAGNOSIS — D500 Iron deficiency anemia secondary to blood loss (chronic): Secondary | ICD-10-CM

## 2015-02-17 DIAGNOSIS — N939 Abnormal uterine and vaginal bleeding, unspecified: Secondary | ICD-10-CM

## 2015-02-17 LAB — CBC
HEMATOCRIT: 34.8 % — AB (ref 36.0–46.0)
HEMOGLOBIN: 11.2 g/dL — AB (ref 12.0–15.0)
MCH: 23.2 pg — AB (ref 26.0–34.0)
MCHC: 32.2 g/dL (ref 30.0–36.0)
MCV: 72 fL — AB (ref 78.0–100.0)
Platelets: 301 10*3/uL (ref 150–400)
RBC: 4.83 MIL/uL (ref 3.87–5.11)
WBC: 5.7 10*3/uL (ref 4.0–10.5)

## 2015-02-17 NOTE — Progress Notes (Signed)
Patient ID: Latoya Guzman, female   DOB: 1976/04/12, 39 y.o.   MRN: [id number]  History:  Latoya Guzman is a 39 y.o. [id number] who presents to clinic today for menorrhagia. The patient states a long history of monthly periods lasting ~ 8 days. She states that they are not always exactly 28 days apart, but is unable to give an accurate menstrual history. She states that her periods are heavy for 7 days. She denies weakness, dizziness or SOB today. She states LMP 01/28/15. She is sexually active without condoms, but has had BTL. She denies significant pain with her periods. She has many other health issues and has required multiple transfusions. Patient was told by MD while in patient recently that she may require a hysterectomy.   The following portions of the patient's history were reviewed and updated as appropriate: allergies, current medications, family history, past medical history, social history, past surgical history and problem list.  Review of Systems:  Other than those mentioned in HPI all ROS negative  Objective:  Physical Exam BP 127/69 mmHg  Pulse 77  Wt 241 lb (109.317 kg) CONSTITUTIONAL: Well-developed, well-nourished female in no acute distress.  EYES: EOM intact, conjunctivae normal, no scleral icterus HEAD: Normocephalic, atraumatic ENT: External right and left ear normal, oropharynx is clear and moist. CARDIOVASCULAR: Normal heart rate noted. No cyanosis or edema.  RESPIRATORY: Effort normal. no problems with respiration noted. GASTROINTESTINAL:Soft, no distention noted.    GENITOURINARY: Patient is not bleeding today. Exam deferred.  MUSCULOSKELETAL: Normal range of motion.  SKIN: Skin is warm and dry. No rash noted. Not diaphoretic. No erythema. No pallor. [place]: Alert and oriented to person, place, and time. Normal muscle tone, coordination.  PSYCHIATRIC: Normal mood and affect. Normal behavior. Normal judgment and thought content. HEM/LYMPH/IMMUNOLOGIC:  Neck supple  Labs and Imaging CBC today  Assessment & Plan:  Assessment: AUB Menorrhagia Anemia  Plans: CBC today [place] scheduled  Patient will require endometrial biopsy Patient advised to continue Iron Supplement as prescribed while awaiting CBC results Information about IUD given Patient to return to [facility] in 2-3 weeks for endometrial biopsy and IUD insertion or surgical consult with MD or sooner if symptoms were to change or worsen  Luvenia Redden, PA-C 02/17/2015 2:29 PM

## 2015-02-17 NOTE — Patient Instructions (Signed)
Levonorgestrel intrauterine device (IUD) What is this medicine? LEVONORGESTREL IUD (LEE voe nor jes trel) is a contraceptive (birth control) device. The device is placed inside the uterus by a healthcare professional. It is used to prevent pregnancy and can also be used to treat heavy bleeding that occurs during your period. Depending on the device, it can be used for 3 to 5 years. This medicine may be used for other purposes; ask your health care provider or pharmacist if you have questions. What should I tell my health care provider before I take this medicine? They need to know if you have any of these conditions: -abnormal Pap smear -cancer of the breast, uterus, or cervix -diabetes -endometritis -genital or pelvic infection now or in the past -have more than one sexual partner or your partner has more than one partner -heart disease -history of an ectopic or tubal pregnancy -immune system problems -IUD in place -liver disease or tumor -problems with blood clots or take blood-thinners -use intravenous drugs -uterus of unusual shape -vaginal bleeding that has not been explained -an unusual or allergic reaction to levonorgestrel, other hormones, silicone, or polyethylene, medicines, foods, dyes, or preservatives -pregnant or trying to get pregnant -breast-feeding How should I use this medicine? This device is placed inside the uterus by a health care professional. Talk to your pediatrician regarding the use of this medicine in children. Special care may be needed. Overdosage: If you think you have taken too much of this medicine contact a poison control center or emergency room at once. NOTE: This medicine is only for you. Do not share this medicine with others. What if I miss a dose? This does not apply. What may interact with this medicine? Do not take this medicine with any of the following medications: -amprenavir -bosentan -fosamprenavir This medicine may also interact with  the following medications: -aprepitant -barbiturate medicines for inducing sleep or treating seizures -bexarotene -griseofulvin -medicines to treat seizures like carbamazepine, ethotoin, felbamate, oxcarbazepine, phenytoin, topiramate -modafinil -pioglitazone -rifabutin -rifampin -rifapentine -some medicines to treat HIV infection like atazanavir, indinavir, lopinavir, nelfinavir, tipranavir, ritonavir -St. John's wort -warfarin This list may not describe all possible interactions. Give your health care provider a list of all the medicines, herbs, non-prescription drugs, or dietary supplements you use. Also tell them if you smoke, drink alcohol, or use illegal drugs. Some items may interact with your medicine. What should I watch for while using this medicine? Visit your doctor or health care professional for regular check ups. See your doctor if you or your partner has sexual contact with others, becomes HIV positive, or gets a sexual transmitted disease. This product does not protect you against HIV infection (AIDS) or other sexually transmitted diseases. You can check the placement of the IUD yourself by reaching up to the top of your vagina with clean fingers to feel the threads. Do not pull on the threads. It is a good habit to check placement after each menstrual period. Call your doctor right away if you feel more of the IUD than just the threads or if you cannot feel the threads at all. The IUD may come out by itself. You may become pregnant if the device comes out. If you notice that the IUD has come out use a backup birth control method like condoms and call your health care provider. Using tampons will not change the position of the IUD and are okay to use during your period. What side effects may I notice from receiving this medicine?   Side effects that you should report to your doctor or health care professional as soon as possible: -allergic reactions like skin rash, itching or  hives, swelling of the face, lips, or tongue -fever, flu-like symptoms -genital sores -high blood pressure -no menstrual period for 6 weeks during use -pain, swelling, warmth in the leg -pelvic pain or tenderness -severe or sudden headache -signs of pregnancy -stomach cramping -sudden shortness of breath -trouble with balance, talking, or walking -unusual vaginal bleeding, discharge -yellowing of the eyes or skin Side effects that usually do not require medical attention (report to your doctor or health care professional if they continue or are bothersome): -acne -breast pain -change in sex drive or performance -changes in weight -cramping, dizziness, or faintness while the device is being inserted -headache -irregular menstrual bleeding within first 3 to 6 months of use -nausea This list may not describe all possible side effects. Call your doctor for medical advice about side effects. You may report side effects to FDA at 1-800-FDA-1088. Where should I keep my medicine? This does not apply. NOTE: This sheet is a summary. It may not cover all possible information. If you have questions about this medicine, talk to your doctor, pharmacist, or health care provider.    2016, Elsevier/Gold Standard. (2011-05-13 13:54:04) Dysfunctional Uterine Bleeding Dysfunctional uterine bleeding is abnormal bleeding from the uterus. Dysfunctional uterine bleeding includes:  A period that comes earlier or later than usual.  A period that is lighter, heavier, or has blood clots.  Bleeding between periods.  Skipping one or more periods.  Bleeding after sexual intercourse.  Bleeding after menopause. HOME CARE INSTRUCTIONS  Pay attention to any changes in your symptoms. Follow these instructions to help with your condition: Eating  Eat well-balanced meals. Include foods that are high in iron, such as liver, meat, shellfish, green leafy vegetables, and eggs.  If you become  constipated:  Drink plenty of water.  Eat fruits and vegetables that are high in water and fiber, such as spinach, carrots, raspberries, apples, and mango. Medicines  Take over-the-counter and prescription medicines only as told by your health care provider.  Do not change medicines without talking with your health care provider.  Aspirin or medicines that contain aspirin may make the bleeding worse. Do not take those medicines:  During the week before your period.  During your period.  If you were prescribed iron pills, take them as told by your health care provider. Iron pills help to replace iron that your body loses because of this condition. Activity  If you need to change your sanitary pad or tampon more than one time every 2 hours:  Lie in bed with your feet raised (elevated).  Place a cold pack on your lower abdomen.  Rest as much as possible until the bleeding stops or slows down.  Do not try to lose weight until the bleeding has stopped and your blood iron level is back to normal. Other Instructions  For two months, write down:  When your period starts.  When your period ends.  When any abnormal bleeding occurs.  What problems you notice.  Keep all follow up visits as told by your health care provider. This is important. SEEK MEDICAL CARE IF:  You get light-headed or weak.  You have nausea and vomiting.  You cannot eat or drink without vomiting.  You feel dizzy or have diarrhea while you are taking medicines.  You are taking birth control pills or hormones, and you want to change  them or stop taking them. SEEK IMMEDIATE MEDICAL CARE IF:  You develop a fever or chills.  You need to change your sanitary pad or tampon more than one time per hour.  Your bleeding becomes heavier, or your flow contains clots more often.  You develop pain in your abdomen.  You lose consciousness.  You develop a rash.   This information is not intended to replace  advice given to you by your health care provider. Make sure you discuss any questions you have with your health care provider.   Document Released: 04/09/2000 Document Revised: 01/01/2015 Document Reviewed: 07/08/2014 Elsevier Interactive Patient Education [employer].

## 2015-02-18 ENCOUNTER — Ambulatory Visit (INDEPENDENT_AMBULATORY_CARE_PROVIDER_SITE_OTHER): Payer: Medicaid Other | Admitting: Family Medicine

## 2015-02-18 ENCOUNTER — Encounter: Payer: Self-pay | Admitting: Family Medicine

## 2015-02-18 VITALS — BP 122/86 | HR 68 | Temp 98.3°F | Wt 240.5 lb

## 2015-02-18 DIAGNOSIS — M79602 Pain in left arm: Secondary | ICD-10-CM

## 2015-02-18 DIAGNOSIS — M79605 Pain in left leg: Secondary | ICD-10-CM

## 2015-02-18 DIAGNOSIS — R760 Raised antibody titer: Secondary | ICD-10-CM | POA: Diagnosis not present

## 2015-02-18 DIAGNOSIS — M7989 Other specified soft tissue disorders: Secondary | ICD-10-CM

## 2015-02-18 DIAGNOSIS — R21 Rash and other nonspecific skin eruption: Secondary | ICD-10-CM | POA: Diagnosis present

## 2015-02-18 MED ORDER — TRIAMCINOLONE ACETONIDE 0.5 % EX OINT: 1.0000 "application " | TOPICAL_OINTMENT | Freq: Two times a day (BID) | CUTANEOUS | Status: DC

## 2015-02-18 NOTE — Progress Notes (Signed)
    Subjective: CC: LE rash HPI: Patient is a 39 y.o. female presenting to clinic today for same day appt.  She is accompanied to appt by a female friend. Concerns today include:  Rash/ LE pain with swelling/ H/o positive autoimmune labs Patient notes that purple and red spots developed on LE yesterday.  She notes that she was evaluated ED several years ago and was told she had purpura.  She was supposed to go to Rheumatology but was never able to go.  Discussed referral with PCP at last visit and was told that this may be done in the future.  She endorses weakness and swelling of her L lower extremity.  No fevers, trauma, new foods, new soaps, detergents, lotions.  Endorses occ chills.  She notes that there is an aching pain in the anterior aspect of her L shin.  She was able to ambulate normally today.  She does note continued pain in the LLE.  She also notes intermittent visual disturbances.  Social History Reviewed: non smoker. FamHx and MedHx updated.  Please see EMR.  ROS: Per HPI  Objective: Office vital signs reviewed. BP 122/86 mmHg  Pulse 68  Temp(Src) 98.3 F (36.8 C) (Oral)  Wt 240 lb 8 oz (109.09 kg)  LMP 01/20/2015  Physical Examination:  General: Awake, alert, obese, NAD HEENT: Normal    Eyes: PERRLA, EOMI    Throat: MMM, no erythema Cardio: RRR, S1S2 heard, no murmurs appreciated Pulm: CTAB, no wheezes, rhonchi or rales, normal WOB Extremities: WWP, No edema, cyanosis or clubbing; +2 PT pulses bilaterally, calves appear symmetrical, negative Homan's, mild erythema present around mid calf extending to anterior shin on RLE. MSK: Normal gait and station Skin: dry, intact, several erythematous, blanching macular areas, no exudate, no increased warmth, no evidence of petechiae or purpura. Neuro: Strength and sensation grossly intact, follows commands, no focal neurologic deficits  Assessment/ Plan: 39 y.o. female with  1. Rash and nonspecific skin eruption.  No  evidence of reticular rash or purpura on exam.  Though in the setting of +antiphospholipid antibody patient is at increased risk of Livedo reticularis.  02/17/15 CBC reviewed, platelets normal. - triamcinolone ointment (KENALOG) 0.5 %; Apply 1 application topically 2 (two) times daily.  Dispense: 30 g; Refill: 1  2. Leg pain, anterior, left.  No overt evidence of DVT on exam, but cannot r/o completely.  In addition patient with h/o +antiphospholipid antibody.  No official dx of Antiphospholipid syndrome, but h/o is suspicious for this and she would therefore be at increased risk of DVT formation. - LE VENOUS; Future - Return precautions reviewed - Follow up with PCP in next 2 weeks or sooner if needed  3. Swelling of lower extremity - LE VENOUS; Future  4. Antiphospholipid antibody positive.  Never established care.  At risk of clots if true dx of antiphospholipid syndrome. - Ambulatory referral to Rheumatology - Return precautions reviewed, see AVS. - Follow up with PCP as scheduled for routine care.  Janora Norlander, DO PGY-2, [place]

## 2015-02-18 NOTE — Patient Instructions (Signed)
Get your ultrasound of your legs done.  I have sent in a prescription for a cream for your legs.  Referral to rheumatology has also been placed for you.  If you develop shortness of breath, weakness, vision changes, chest pain seek immediate medical attention.

## 2015-02-21 ENCOUNTER — Encounter (HOSPITAL_COMMUNITY): Payer: Self-pay | Admitting: *Deleted

## 2015-02-21 ENCOUNTER — Emergency Department (INDEPENDENT_AMBULATORY_CARE_PROVIDER_SITE_OTHER)
Admission: EM | Admit: 2015-02-21 | Discharge: 2015-02-21 | Disposition: A | Discharge status: Nursing Home (Includes ICF) | Payer: Medicaid Other | Source: Home / Self Care | Attending: Family Medicine | Admitting: Family Medicine

## 2015-02-21 ENCOUNTER — Emergency Department (HOSPITAL_COMMUNITY)
Admission: EM | Admit: 2015-02-21 | Discharge: 2015-02-22 | Disposition: A | Discharge status: Home / Self Care | Payer: Medicaid Other | Attending: Emergency Medicine | Admitting: Emergency Medicine

## 2015-02-21 ENCOUNTER — Emergency Department (HOSPITAL_COMMUNITY): Payer: Medicaid Other

## 2015-02-21 ENCOUNTER — Encounter (HOSPITAL_COMMUNITY): Payer: Self-pay

## 2015-02-21 DIAGNOSIS — Z86018 Personal history of other benign neoplasm: Secondary | ICD-10-CM | POA: Insufficient documentation

## 2015-02-21 DIAGNOSIS — R202 Paresthesia of skin: Secondary | ICD-10-CM

## 2015-02-21 DIAGNOSIS — R51 Headache: Secondary | ICD-10-CM | POA: Diagnosis not present

## 2015-02-21 DIAGNOSIS — F329 Major depressive disorder, single episode, unspecified: Secondary | ICD-10-CM | POA: Diagnosis not present

## 2015-02-21 DIAGNOSIS — Z8679 Personal history of other diseases of the circulatory system: Secondary | ICD-10-CM | POA: Diagnosis not present

## 2015-02-21 DIAGNOSIS — M79601 Pain in right arm: Secondary | ICD-10-CM

## 2015-02-21 DIAGNOSIS — M79621 Pain in right upper arm: Secondary | ICD-10-CM | POA: Insufficient documentation

## 2015-02-21 DIAGNOSIS — H539 Unspecified visual disturbance: Secondary | ICD-10-CM

## 2015-02-21 DIAGNOSIS — H538 Other visual disturbances: Secondary | ICD-10-CM | POA: Diagnosis not present

## 2015-02-21 DIAGNOSIS — D649 Anemia, unspecified: Secondary | ICD-10-CM | POA: Insufficient documentation

## 2015-02-21 DIAGNOSIS — M25511 Pain in right shoulder: Secondary | ICD-10-CM | POA: Diagnosis not present

## 2015-02-21 DIAGNOSIS — Z8751 Personal history of pre-term labor: Secondary | ICD-10-CM | POA: Insufficient documentation

## 2015-02-21 DIAGNOSIS — Z8709 Personal history of other diseases of the respiratory system: Secondary | ICD-10-CM | POA: Diagnosis not present

## 2015-02-21 DIAGNOSIS — Z79899 Other long term (current) drug therapy: Secondary | ICD-10-CM | POA: Diagnosis not present

## 2015-02-21 DIAGNOSIS — M25521 Pain in right elbow: Secondary | ICD-10-CM | POA: Insufficient documentation

## 2015-02-21 DIAGNOSIS — R200 Anesthesia of skin: Secondary | ICD-10-CM

## 2015-02-21 LAB — BASIC METABOLIC PANEL
ANION GAP: 8 (ref 5–15)
BUN: 12 mg/dL (ref 6–20)
CHLORIDE: 107 mmol/L (ref 101–111)
CO2: 23 mmol/L (ref 22–32)
CREATININE: 0.97 mg/dL (ref 0.44–1.00)
Calcium: 9.6 mg/dL (ref 8.9–10.3)
GFR calc non Af Amer: >60 mL/min (ref >60)
Glucose, Bld: 95 mg/dL (ref 65–99)
POTASSIUM: 4.3 mmol/L (ref 3.5–5.1)
SODIUM: 138 mmol/L (ref 135–145)

## 2015-02-21 LAB — CBC WITH DIFFERENTIAL/PLATELET
BASOS ABS: 0.1 10*3/uL (ref 0.0–0.1)
BASOS PCT: 1 %
EOS ABS: 0.1 10*3/uL (ref 0.0–0.7)
Eosinophils Relative: 2 %
HEMATOCRIT: 32.7 % — AB (ref 36.0–46.0)
HEMOGLOBIN: 11 g/dL — AB (ref 12.0–15.0)
Lymphocytes Relative: 35 %
Lymphs Abs: 2.1 10*3/uL (ref 0.7–4.0)
MCH: 24.3 pg — ABNORMAL LOW (ref 26.0–34.0)
MCHC: 33.6 g/dL (ref 30.0–36.0)
MCV: 72.3 fL — ABNORMAL LOW (ref 78.0–100.0)
MONOS PCT: 8 %
Monocytes Absolute: 0.5 10*3/uL (ref 0.1–1.0)
NEUTROS ABS: 3.3 10*3/uL (ref 1.7–7.7)
Neutrophils Relative %: 54 %
Platelets: 284 10*3/uL (ref 150–400)
RBC: 4.52 MIL/uL (ref 3.87–5.11)
WBC: 6.1 10*3/uL (ref 4.0–10.5)

## 2015-02-21 NOTE — ED Provider Notes (Signed)
CSN: [id number]     Arrival date & time 02/21/15  2057 History   First MD Initiated Contact with Patient 02/21/15 2115     Chief Complaint  Patient presents with  . Arm Pain    HPI   Latoya Guzman is a 39 y.o. female with a PMH of depression, anemia, headache, lupus who presents to the ED with pain and swelling to her right upper extremity x 3 weeks. She reports her symptoms are constant. She states movement exacerbates her pain. She has tried warm compresses for symptom relief. She reports associated paresthesia and "twitching." She also states she has experienced intermittent vision changes in her right eye x 3 weeks. She reports her right eye "moves out" and that her VA becomes blurry. She states her vision in her left eye seemed like it had "a film over it" this morning. She also reports headache. She denies fever, chills, recent injury, dizziness, lightheadedness, chest pain, shortness of breath, abdominal pain, N/V/D/C, weakness, numbness. She states she had bilateral lower extremity edema and rash last week, and was evaluated by her PCP. She is scheduled for lower extremity US on Monday. Patient sent from urgent care for these symptoms. Per record review, urgent care physician discussed patient with on call neurologist, Dr. Armida Sans, who advised to send patient to the ED for further evaluation.  Past Medical History  Diagnosis Date  . Blood transfusion 1998; 2015    Post C/S; related to anemia  . Depression     Was on Lexapro Stopped when pregnant  . Preterm labor     PTL last two pregnancies  . Fibroid 03/2010    Noted on [place] in Dec.  . 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 Tricuspid valve regurgitation 02/27/2010  . Lupus (HCC) Lupus Antigen     associated with pregnancy   . Sickle cell trait ([place])   . Anemia 12/19/2012  . Antiphospholipid antibody positive   . Chronic bronchitis ([place])   . Headache     "weekly"  (01/01/2015)  . Anxiety   . Pica     toilet tissue   Past Surgical History  Procedure Laterality Date  . Cesarean section  1998, 2009  . Tubal ligation  11/14/2010  . Cesarean section  11/14/2010    Procedure: CESAREAN SECTION;  Surgeon: Juliene Pina C. Hulan Fray, MD;  Location: [facility];  Service: Gynecology;  Laterality: N/A;  Repeat cesarean section with delivery of baby boy at 44. Apgars 9/9. Bilateral tubal ligation with filshie clips.   . Dilation and curettage of uterus  2005, 2006    1st for twin loss, 2nd for retained placenta  . Esophagogastroduodenoscopy N/A 01/02/2015    Procedure: ESOPHAGOGASTRODUODENOSCOPY (EGD);  Surgeon: Manus Gunning, MD;  Location: [facility];  Service: Gastroenterology;  Laterality: N/A;   Family History  Problem Relation Age of Onset  . Hypertension Mother   . Lupus Sister   . Diabetes Maternal Uncle   . Cancer Maternal Grandmother   . Colon cancer Neg Hx    Social History  Substance Use Topics  . Smoking status: Never Smoker   . Smokeless tobacco: Never Used  . Alcohol Use: Yes     Comment: 01/01/2015 "might have a couple drinks/year"   OB History    Gravida Para Term Preterm AB TAB SAB Ectopic Multiple Living   '10 8 6 2 2  2  1 ' 7  Review of Systems  Constitutional: Negative for fever and chills.  HENT: Negative for congestion.   Eyes: Positive for visual disturbance.  Respiratory: Negative for cough and shortness of breath.   Cardiovascular: Negative for chest pain.  Gastrointestinal: Negative for nausea, vomiting, abdominal pain, diarrhea and constipation.  Genitourinary: Negative for dysuria, urgency and frequency.  Neurological: Positive for headaches. Negative for dizziness, weakness, light-headedness and numbness.  All other systems reviewed and are negative.     Allergies  Review of patient's allergies indicates no known allergies.  Home Medications   Prior to Admission medications   Medication Sig Start Date End Date  Taking? Authorizing Provider  ferrous sulfate (FERROUSUL) 325 (65 FE) MG tablet Take 1 tablet (325 mg total) by mouth 2 (two) times daily after a meal. 07/10/13  Yes Rosemarie Ax, MD  Multiple Vitamins-Minerals (MULTIVITAMIN ADULT PO) Take by mouth.   Yes Historical Provider, MD  acetaminophen (TYLENOL) 500 MG tablet Take 1 tablet (500 mg total) by mouth every 6 (six) hours as needed. 02/22/15   Marella Chimes, PA-C  Na Sulfate-K Sulfate-Mg Sulf SOLN Take 1 kit by mouth once. suprep as directed. No substitutions. Patient not taking: Reported on 02/21/2015 01/17/15   Manus Gunning, MD  polyethylene glycol [facility] / Floria Raveling) packet Take 17 g by mouth daily. Patient not taking: Reported on 02/17/2015 01/02/15   Sela Hua, MD  triamcinolone ointment (KENALOG) 0.5 % Apply 1 application topically 2 (two) times daily. Patient not taking: Reported on 02/21/2015 02/18/15   Ashly M Gottschalk, DO     BP 124/69 mmHg  Pulse 65  Temp(Src) 97.6 F (36.4 C) (Oral)  Resp 16  Wt 240 lb 6.4 oz (109.045 kg)  SpO2 100%  LMP 01/20/2015 Physical Exam  Constitutional: She is oriented to person, place, and time. She appears well-developed and well-nourished. No distress.  HENT:  Head: Normocephalic and atraumatic.  Right Ear: External ear normal.  Left Ear: External ear normal.  Nose: Nose normal.  Mouth/Throat: Uvula is midline, oropharynx is clear and moist and mucous membranes are normal.  Eyes: Conjunctivae, EOM and lids are normal. Pupils are equal, round, and reactive to light. Right eye exhibits no discharge. Left eye exhibits no discharge. No scleral icterus.  Neck: Normal range of motion. Neck supple.  Cardiovascular: Normal rate, regular rhythm, normal heart sounds, intact distal pulses and normal pulses.   Pulmonary/Chest: Effort normal and breath sounds normal. No respiratory distress. She has no wheezes. She has no rales.  Abdominal: Soft. Normal appearance and bowel  sounds are normal. She exhibits no distension and no mass. There is no tenderness. There is no rigidity, no rebound and no guarding.  Musculoskeletal: Normal range of motion. She exhibits tenderness. She exhibits no edema.  Right posterior shoulder and right medial elbow TTP. No significant edema or erythema. Full range of motion of right upper extremity. Strength and sensation intact. Distal pulses intact.  Neurological: She is alert and oriented to person, place, and time. She has normal strength and normal reflexes. No cranial nerve deficit or sensory deficit. Coordination normal.  Skin: Skin is warm, dry and intact. No rash noted. She is not diaphoretic. No erythema. No pallor.  Psychiatric: She has a normal mood and affect. Her speech is normal and behavior is normal.  Nursing note and vitals reviewed.   ED Course  Procedures (including critical care time)  Labs Review Labs Reviewed  CBC WITH DIFFERENTIAL/PLATELET - Abnormal; Notable for the following:  Hemoglobin 11.0 (*)    HCT 32.7 (*)    MCV 72.3 (*)    MCH 24.3 (*)    All other components within normal limits  BASIC METABOLIC PANEL    Imaging Review Mr Jeri Cos Wo Contrast  02/22/2015  CLINICAL DATA:  RIGHT arm swelling, blurry vision, headache and hand twitching for 2 weeks. History of lupus, sickle cell trait, headache, tricuspid regurgitation. EXAM: MRI HEAD AND ORBITS WITHOUT AND WITH CONTRAST TECHNIQUE: Multiplanar, multiecho pulse sequences of the brain and surrounding structures were obtained without and with intravenous contrast. Multiplanar, multiecho pulse sequences of the orbits and surrounding structures were obtained including fat saturation techniques, before and after intravenous contrast administration. CONTRAST:  73m MULTIHANCE GADOBENATE DIMEGLUMINE 529 MG/ML IV SOLN COMPARISON:  MRI of the brain December 20, 2012 FINDINGS: MRI HEAD FINDINGS The ventricles and sulci are normal for patient's age. No abnormal  parenchymal signal, mass lesions, mass effect. No abnormal parenchymal enhancement. No reduced diffusion to suggest acute ischemia nor hyperacute demyelination. No susceptibility artifact to suggest hemorrhage. No abnormal extra-axial fluid collections. No extra-axial masses nor leptomeningeal enhancement. Normal major intracranial vascular flow voids seen at the skull base. No suspicious calvarial bone marrow signal. No abnormal sellar expansion. Craniocervical junction maintained. LEFT maxillary mucosal retention cyst without paranasal sinus air-fluid levels. Paranasal sinus mucosal thickening. The mastoid air cells are well aerated. Paranasal sinuses and mastoid air cells are well-aerated. MRI ORBITS FINDINGS Ocular globes intact, the lenses are located. Normal signal characteristics without ocular globes and discs. Normal appearance of the optic nerves, chiasm radiations without abnormal enhancement. Normal appearance of the orbital fat. Normal appearance of the extraocular muscles. Superior ophthalmic veins are not enlarged. No abnormal calvarial bone marrow signal. IMPRESSION: Normal MRI of the brain with contrast. Normal MRI of the orbits with contrast. Electronically Signed   By: CElon AlasM.D.   On: 02/22/2015 00:46   Mr ODarnelle CatalanWo/w Cm  02/22/2015  CLINICAL DATA:  RIGHT arm swelling, blurry vision, headache and hand twitching for 2 weeks. History of lupus, sickle cell trait, headache, tricuspid regurgitation. EXAM: MRI HEAD AND ORBITS WITHOUT AND WITH CONTRAST TECHNIQUE: Multiplanar, multiecho pulse sequences of the brain and surrounding structures were obtained without and with intravenous contrast. Multiplanar, multiecho pulse sequences of the orbits and surrounding structures were obtained including fat saturation techniques, before and after intravenous contrast administration. CONTRAST:  220mMULTIHANCE GADOBENATE DIMEGLUMINE 529 MG/ML IV SOLN COMPARISON:  MRI of the brain December 20, 2012  FINDINGS: MRI HEAD FINDINGS The ventricles and sulci are normal for patient's age. No abnormal parenchymal signal, mass lesions, mass effect. No abnormal parenchymal enhancement. No reduced diffusion to suggest acute ischemia nor hyperacute demyelination. No susceptibility artifact to suggest hemorrhage. No abnormal extra-axial fluid collections. No extra-axial masses nor leptomeningeal enhancement. Normal major intracranial vascular flow voids seen at the skull base. No suspicious calvarial bone marrow signal. No abnormal sellar expansion. Craniocervical junction maintained. LEFT maxillary mucosal retention cyst without paranasal sinus air-fluid levels. Paranasal sinus mucosal thickening. The mastoid air cells are well aerated. Paranasal sinuses and mastoid air cells are well-aerated. MRI ORBITS FINDINGS Ocular globes intact, the lenses are located. Normal signal characteristics without ocular globes and discs. Normal appearance of the optic nerves, chiasm radiations without abnormal enhancement. Normal appearance of the orbital fat. Normal appearance of the extraocular muscles. Superior ophthalmic veins are not enlarged. No abnormal calvarial bone marrow signal. IMPRESSION: Normal MRI of the brain with contrast. Normal MRI of the orbits  with contrast. Electronically Signed   By: Elon Alas M.D.   On: 02/22/2015 00:46     I have personally reviewed and evaluated these images and lab results as part of my medical decision-making.   EKG Interpretation None      MDM   Final diagnoses:  Vision changes  Right arm pain    39 year old female presents from urgent care with pain, swelling, paresthesia, and twitching to her right upper extremity as well as vision changes x 3 weeks. Also reports headache. Denies fever, chills, recent injury, dizziness, lightheadedness, chest pain, shortness of breath, abdominal pain, N/V/D/C, weakness, numbness. Patient is afebrile. Vital signs stable. Heart regular  rate and rhythm. Lungs clear to auscultation bilaterally. Abdomen soft, nontender, nondistended. Right posterior shoulder and right medial elbow TTP. No bony tenderness. No significant edema or erythema. Full range of motion of right upper extremity. Strength and sensation intact. Distal pulses intact. Normal neuro exam with no focal deficit. Strength, sensation, coordination intact.  Neuro consulted. Spoke with Dr. Armida Sans, who recommended obtaining MRI of brain and orbits with and without contrast for further evaluation of patient's symptoms. CBC negative for leukocytosis, hemoglobin 11. BMP unremarkable. Imaging pending.   MRI normal. Spoke with patient regarding findings. Patient is well-appearing, feel she is stable for discharge at this time. Patient to follow-up with neurology. Return precautions discussed. Patient verbalizes her understanding and is in agreement with plan.  BP 124/69 mmHg  Pulse 65  Temp(Src) 97.6 F (36.4 C) (Oral)  Resp 16  Wt 240 lb 6.4 oz (109.045 kg)  SpO2 100%  LMP 01/20/2015     Marella Chimes, PA-C 02/22/15 0501  Merrily Pew, MD 02/22/15 [phone]

## 2015-02-21 NOTE — ED Notes (Signed)
Patient  Complains of right arm swollen for the past three weeks Denies any injury

## 2015-02-21 NOTE — ED Notes (Signed)
The pt is c/o painf and swelling in her rt arm for 3 weeks  No known injury  lmp now

## 2015-02-21 NOTE — ED Provider Notes (Signed)
CSN: [id number]     Arrival date & time 02/21/15  1943 History   First MD Initiated Contact with Patient 02/21/15 2018     Chief Complaint  Patient presents with  . Arm Swelling   (Consider location/radiation/quality/duration/timing/severity/associated sxs/prior Treatment) HPI    Right arm swelling, blurry vision, headache, hand twitching. Ongoing for the last 2 weeks. Sensation of arm swelling has been constant for this period time while blurry vision, headache and hand twitching has been intermittent. Tried elevation and warm compresses w/o improvement. Arm pain is from the elbow to the hand. Of note patient states that initially she lost all vision for several minutes but then the vision returned. Sates that now intermittently she'll have one eye or the other get a "film over it." Currently patient states that her left eye has some blurriness to it. She also states that her right eye will move out words intermittently.    Patient evaluated previously her primary care physician for this problem as well as for lower extremity swelling and petechiae. States that she is scheduled to undergo lower extremity ultrasound next week. No visible rash at this time.      Past Medical History  Diagnosis Date  . Blood transfusion 1998; 2015    Post C/S; related to anemia  . Depression     Was on Lexapro Stopped when pregnant  . Preterm labor     PTL last two pregnancies  . Fibroid 03/2010    Noted on [place] in Dec.  . 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 Tricuspid valve regurgitation 02/27/2010  . Lupus (HCC) Lupus Antigen     associated with pregnancy   . Sickle cell trait ([place])   . Anemia 12/19/2012  . Antiphospholipid antibody positive   . Chronic bronchitis ([place])   . Headache     "weekly" (01/01/2015)  . Anxiety   . Pica     toilet tissue   Past Surgical History  Procedure Laterality Date  . Cesarean section  1998, 2009   . Tubal ligation  11/14/2010  . Cesarean section  11/14/2010    Procedure: CESAREAN SECTION;  Surgeon: Juliene Pina C. Hulan Fray, MD;  Location: [facility];  Service: Gynecology;  Laterality: N/A;  Repeat cesarean section with delivery of baby boy at 80. Apgars 9/9. Bilateral tubal ligation with filshie clips.   . Dilation and curettage of uterus  2005, 2006    1st for twin loss, 2nd for retained placenta  . Esophagogastroduodenoscopy N/A 01/02/2015    Procedure: ESOPHAGOGASTRODUODENOSCOPY (EGD);  Surgeon: Manus Gunning, MD;  Location: [facility];  Service: Gastroenterology;  Laterality: N/A;   Family History  Problem Relation Age of Onset  . Hypertension Mother   . Lupus Sister   . Diabetes Maternal Uncle   . Cancer Maternal Grandmother   . Colon cancer Neg Hx    Social History  Substance Use Topics  . Smoking status: Never Smoker   . Smokeless tobacco: Never Used  . Alcohol Use: Yes     Comment: 01/01/2015 "might have a couple drinks/year"   OB History    Gravida Para Term Preterm AB TAB SAB Ectopic Multiple Living   _0 Review of Systems Per HPI with all other pertinent systems negative.   Allergies  Review of patient's allergies indicates no known allergies.  Home Medications   Prior to  Admission medications   Medication Sig Start Date End Date Taking? Authorizing Provider  ferrous sulfate (FERROUSUL) 325 (65 FE) MG tablet Take 1 tablet (325 mg total) by mouth 2 (two) times daily after a meal. 07/10/13   Rosemarie Ax, MD  Multiple Vitamins-Minerals (MULTIVITAMIN ADULT PO) Take by mouth.    Historical Provider, MD  Na Sulfate-K Sulfate-Mg Sulf SOLN Take 1 kit by mouth once. suprep as directed. No substitutions. 01/17/15   Manus Gunning, MD  polyethylene glycol [facility] / Floria Raveling) packet Take 17 g by mouth daily. Patient not taking: Reported on 02/17/2015 01/02/15   Sela Hua, MD  triamcinolone ointment (KENALOG) 0.5 % Apply 1 application  topically 2 (two) times daily. 02/18/15   Janora Norlander, DO   Meds Ordered and Administered this Visit  Medications - No data to display  BP 117/68 mmHg  Pulse 58  Temp(Src) 97.7 F (36.5 C) (Oral)  Resp 18  SpO2 100%  LMP 01/20/2015 No data found.   Physical Exam Physical Exam  Constitutional: oriented to person, place, and time. appears well-developed and well-nourished. No distress.  HENT:  Head: Normocephalic and atraumatic.  Eyes: EOMI. PERRL.  Neck: Normal range of motion.  Cardiovascular: RRR, no m/r/g, 2+ distal pulses,  Pulmonary/Chest: Effort normal and breath sounds normal. No respiratory distress.  Abdominal: Soft. Bowel sounds are normal. NonTTP, no distension.  Musculoskeletal: Right upper extremity appears symmetrical to the left. Measurements were taken at the distal forearm, elbow, and shoulder region and revealed identical measurements bilaterally. Grip strength is 5 out of 5 bilaterally, full range of motion, no appreciable swelling.  Neurological: Blurry vision on left, no dysmetria, and placed without difficulty, grip strength 5 out of 5 bilateral. Moves all extremity coordinated fashion.  Skin: Skin is warm. No rash noted. non diaphoretic.  Psychiatric: normal mood and affect. behavior is normal. Judgment and thought content normal.   ED Course  Procedures (including critical care time)  Labs Review Labs Reviewed - No data to display  Imaging Review No results found.   Visual Acuity Review  Right Eye Distance:   Left Eye Distance:   Bilateral Distance:    Right Eye Near:   Left Eye Near:    Bilateral Near:         MDM   1. Blurry vision, left eye   2. Right arm numbness    There is concern for neurologic etiology of symptoms including lupus flare versus new onset MS versus psychosomatic. Unlikely stroke but still possible. Unlikely DVT given symmetrical measurement of arms. Discussed case with on-call neurology Dr. Aram Beecham. Agrees  with transfer to the ED for more emergent and thorough workup including MRI of brain and orbits and neurology consult depending on results. Patient amenable to this plan and will be transferred to the ED via shuttle.    Waldemar Dickens, MD 02/21/15 [phone]

## 2015-02-21 NOTE — ED Notes (Signed)
Patient being transferred to ED for further evaluation Spoke with Shanon Brow in the ED

## 2015-02-22 MED ORDER — GADOBENATE DIMEGLUMINE 529 MG/ML IV SOLN
20.0000 mL | Freq: Once | INTRAVENOUS | Status: AC | PRN
  Administered 2015-02-22: 20 mL via INTRAVENOUS

## 2015-02-22 MED ORDER — ACETAMINOPHEN 325 MG PO TABS
650.0000 mg | ORAL_TABLET | Freq: Once | ORAL | Status: AC
  Administered 2015-02-22: 650 mg via ORAL
  Filled 2015-02-22: qty 2

## 2015-02-22 MED ORDER — ACETAMINOPHEN 500 MG PO TABS: 500.0000 mg | ORAL_TABLET | Freq: Four times a day (QID) | ORAL | Status: DC | PRN

## 2015-02-22 NOTE — ED Notes (Signed)
Pt. Left with all belongings and refused wheelchair. Discharge instructions were reviewed and all questions were answered.  

## 2015-02-22 NOTE — Discharge Instructions (Signed)
1. Medications: tylenol, usual home medications 2. Treatment: rest, drink plenty of fluids 3. Follow Up: please followup with neurology for discussion of your diagnoses and further evaluation after today's visit; if you do not have a primary care doctor use the resource guide provided to find one; please return to the ER for severe headache, loss of vision, passing out, new or worsening symptoms   Musculoskeletal Pain Musculoskeletal pain is muscle and boney aches and pains. These pains can occur in any part of the body. Your caregiver may treat you without knowing the cause of the pain. They may treat you if blood or urine tests, X-rays, and other tests were normal.  CAUSES There is often not a definite cause or reason for these pains. These pains may be caused by a type of germ (virus). The discomfort may also come from overuse. Overuse includes working out too hard when your body is not fit. Boney aches also come from weather changes. Bone is sensitive to atmospheric pressure changes. HOME CARE INSTRUCTIONS   Ask when your test results will be ready. Make sure you get your test results.  Only take over-the-counter or prescription medicines for pain, discomfort, or fever as directed by your caregiver. If you were given medications for your condition, do not drive, operate machinery or power tools, or sign legal documents for 24 hours. Do not drink alcohol. Do not take sleeping pills or other medications that may interfere with treatment.  Continue all activities unless the activities cause more pain. When the pain lessens, slowly resume normal activities. Gradually increase the intensity and duration of the activities or exercise.  During periods of severe pain, bed rest may be helpful. Lay or sit in any position that is comfortable.  Putting ice on the injured area.  Put ice in a bag.  Place a towel between your skin and the bag.  Leave the ice on for 15 to 20 minutes, 3 to 4 times a  day.  Follow up with your caregiver for continued problems and no reason can be found for the pain. If the pain becomes worse or does not go away, it may be necessary to repeat tests or do additional testing. Your caregiver may need to look further for a possible cause. SEEK IMMEDIATE MEDICAL CARE IF:  You have pain that is getting worse and is not relieved by medications.  You develop chest pain that is associated with shortness or breath, sweating, feeling sick to your stomach (nauseous), or throw up (vomit).  Your pain becomes localized to the abdomen.  You develop any new symptoms that seem different or that concern you. MAKE SURE YOU:   Understand these instructions.  Will watch your condition.  Will get help right away if you are not doing well or get worse.   This information is not intended to replace advice given to you by your health care provider. Make sure you discuss any questions you have with your health care provider.   Document Released: 04/12/2005 Document Revised: 07/05/2011 Document Reviewed: 12/15/2012 Elsevier Interactive Patient Education 2016 Elsevier Inc.  Visual Disturbances You have had a disturbance in your vision. This may be caused by various conditions, such as:  Migraines. Migraine headaches are often preceded by a disturbance in vision. Blind spots or light flashes are followed by a headache. This type of visual disturbance is temporary. It does not damage the eye.  Glaucoma. This is caused by increased pressure in the eye. Symptoms include haziness, blurred vision,  or seeing rainbow colored circles when looking at bright lights. Partial or complete visual loss can occur. You may or may not experience eye pain. Visual loss may be gradual or sudden and is irreversible. Glaucoma is the leading cause of blindness.  Retina problems. Vision will be reduced if the retina becomes detached or if there is a circulation problem as with diabetes, high blood  pressure, or a mini-stroke. Symptoms include seeing "floaters," flashes of light, or shadows, as if a curtain has fallen over your eye.  Optic nerve problems. The main nerve in your eye can be damaged by redness, soreness, and swelling (inflammation), poor circulation, drugs, and toxins. It is very important to have a complete exam done by a specialist to determine the exact cause of your eye problem. The specialist may recommend medicines or surgery, depending on the cause of the problem. This can help prevent further loss of vision or reduce the risk of having a stroke. Contact the caregiver to whom you have been referred and arrange for follow-up care right away. SEEK IMMEDIATE MEDICAL CARE IF:   Your vision gets worse.  You develop severe headaches.  You have any weakness or numbness in the face, arms, or legs.  You have any trouble speaking or walking.   This information is not intended to replace advice given to you by your health care provider. Make sure you discuss any questions you have with your health care provider.   Document Released: 05/20/2004 Document Revised: 07/05/2011 Document Reviewed: 09/19/2013 Elsevier Interactive Patient Education 2016 [employer].   Emergency Department Resource Guide 1) Find a Doctor and Pay Out of Pocket Although you won't have to find out who is covered by your insurance plan, it is a good idea to ask around and get recommendations. You will then need to call the office and see if the doctor you have chosen will accept you as a new patient and what types of options they offer for patients who are self-pay. Some doctors offer discounts or will set up payment plans for their patients who do not have insurance, but you will need to ask so you aren't surprised when you get to your appointment.  2) Contact Your Local Health Department Not all health departments have doctors that can see patients for sick visits, but many do, so it is worth a call to  see if yours does. If you don't know where your local health department is, you can check in your phone book. The CDC also has a tool to help you locate your state's health department, and many state websites also have listings of all of their local health departments.  3) Find a [facility] If your illness is not likely to be very severe or complicated, you may want to try a walk in clinic. These are popping up all over the country in pharmacies, drugstores, and shopping centers. They're usually staffed by nurse practitioners or physician assistants that have been trained to treat common illnesses and complaints. They're usually fairly quick and inexpensive. However, if you have serious medical issues or chronic medical problems, these are probably not your best option.  No Primary Care Doctor: - Call Health Connect at  [phone] - they can help you locate a primary care doctor that  accepts your insurance, provides certain services, etc. - Physician Referral Service- [phone]  Chronic Pain Problems: Organization         Address  Phone   Notes  [facility]  [phone] Patients need to be referred by their primary care doctor.   Medication Assistance: Organization         Address  Phone   Notes  [facility] Medication [facility] [address] [place], [place] [address] [phone] --Must be a resident of [facility] -- Must have NO insurance coverage whatsoever (no Medicaid/ Medicare, etc.) -- The pt. MUST have a primary care doctor that directs their care regularly and follows them in the community   Med[facility]ist  [phone]   [employer]  [phone]    Agencies that provide inexpensive medical care: Organization         Address  Phone   Notes  [place]  [phone]   [facility]    [phone]   [facility] [place], [place] [address] [phone]   [place] [address] [place] [phone]   Planned Parenthood    [phone]   [facility]    [phone]   [place] and [address], [place] Phone:  [phone], Fax:  [phone] Hours of Operation:  9 am - 6 pm, M-F.  Also accepts Medicaid/Medicare and self-pay.  [facility] for [place], [address], [place] Phone: [phone], Fax: [phone]. Hours of Operation:  8:30 am - 5:30 pm, M-F.  Also accepts Medicaid and self-pay.  [facility] [place] [address] [place] Phone: [phone]   [place], [place], [place] [phone], Ext. [phone] Mondays & Thursdays: 7-9 AM.  First 15 patients are seen on a first come, first serve basis.    [place] Providers:  Organization         Address  Phone   Notes  [facility] [address], [place] [phone] Also accepts self-pay patients.  [facility] [address], [place]  [phone]   [place], [address], [place] [phone]   [facility] [address], [place] [phone]   Lucianne Lei [address], [place]   [phone] Only accepts [place] Access [place] patients after they have their name applied to their card.   Self-Pay (no insurance) in [facility]:  Organization         Address  Phone   Notes  Sickle Cell Patients, [facility] [place] [phone]   [facility] [place] [phone]   [facility] [place]  [place], [place], [place] [phone]   [facility]/Dr. Osei-Bonsu  [address], [place] or [address], [place] [phone] Phone number for both [place] and [place] locations is the same.  [facility] [address], [place] [phone]   [facility] [address] [place] or [address] [phone]   [facility] [address], [place] [phone], phone; [phone], fax Sees patients 1st and 3rd Saturday of every month.  Must not qualify for public or private insurance (i.e. Medicaid, Medicare,  Health Choice,  Veterans' Benefits)  Household income should be no more than 200% of the poverty level The clinic cannot treat you if you are pregnant or think you are pregnant  Sexually transmitted diseases are not treated at the clinic.    Dental Care: Organization         Address  Phone  Notes  [facility] Department of [facility] [place] [phone] Accepts children up to age 77 who are enrolled in [place] or [place]; pregnant women with a Medicaid card; and children who have applied for Medicaid or [place] Health Choice, but were declined, whose parents can pay a reduced fee at time of service.  [facility]  [address], [place] [phone] Accepts children up to age 61 who are enrolled in [place] or [place]; pregnant women with a Medicaid card; and children who have applied for Medicaid or [place] Health Choice, but were declined, whose parents can pay a reduced fee at time of service.  [place] Adult Dental Access PROGRAM  [place] [phone] Patients are seen by appointment only. Walk-ins are not accepted. [place] will see patients 21 years of age and older. Monday - Tuesday (8am-5pm) Most Wednesdays (8:30-5pm) $30 per visit, cash only  [facility] Adult Dental Access PROGRAM  [address], [facility] [phone] Patients are  seen by appointment only. Walk-ins are not accepted. [place] will see patients 67 years of age and older. One Wednesday Evening (Monthly: Volunteer Based).  $30 per visit, cash only  [place]  [phone] for adults; Children under age 49, call Graduate Pediatric Dentistry at [phone]. Children aged 30-14, please call [phone] to request a pediatric application.  Dental services are provided in all areas of dental care including fillings, crowns and bridges, complete and partial dentures, implants, gum treatment, root canals, and extractions. Preventive care is also provided. Treatment is provided to both adults and children. Patients are selected via a lottery and there is often a waiting list.   [facility] [address], [place]  [phone] [email or web address]   [facility] [address] [place], [place] [phone], Ext. [phone] Second and Fourth Thursday of each month, opens at 6:30 AM; Clinic ends at 9 AM.  Patients are seen on a first-come first-served basis, and a limited number are seen during each clinic.   [facility]  [address] [place] Danker [place], [place] [phone]   Eligibility Requirements You must have lived in [place], [place], or [place] counties for at least the last three months.   You cannot be eligible for state or federal sponsored [employer], including [employer], [place], or [employer].   You generally cannot be eligible for healthcare insurance through your employer.    How to apply: Eligibility screenings are held every Tuesday and Wednesday afternoon from 1:00 pm until 4:00 pm. You do not need an appointment for the interview!  [facility] [address] [place], [place]   [place]  [place] Department  [place]  [phone]     Behavioral Health Resources in the Community: Intensive Outpatient Programs Organization         Address  Phone  Notes  [place]. [place]  [address], [place], [place] [phone]   [facility] Outpatient [address] [place], [place]   ADS: Alcohol & Drug Svcs [address] [place], [place]   [place] [address],  [place], [place] or [phone]   Substance Abuse Resources Organization         Address  Phone  Notes  Alcohol and Drug Services  [phone]   [place]  [phone]   [facility] [place]   Chinita Pester  [phone]   Residential & Outpatient Substance Abuse Program  [phone]   Psychological Services Organization         Address  Phone  Notes  [facility]  [place]  [phone]   [place] [address] [place] or [phone]    Mobile Crisis Teams Organization         Address  Phone  Notes  [facility], Mobile Crisis Care Unit  [phone]   [facility]  [address] [place], [place]   [place] Levels [address] [place] [phone]    Self-Help/Support Groups Organization         Address  Phone             Notes  [place]. of [place] - variety of support groups  [place] Call for more information  Narcotics Anonymous (NA), [facility] [address], [employer] [place]  2 meetings at this location   Special educational needs teacher         Address  Phone  Notes  [facility] [place],    [place]  [phone]   [facility]  [address] [place] [address], [place], [place]   [place], [place] [phone] Admissions: 8am-3pm M-F  Incentives  Substance Wisdom [address],    [place], [place] [phone]   [facility] [address] [place], [place], [place]   [facility] [address]  [place], [place]   [facility] - Intensive Outpatient [address] Kristeen [address], [place], [place]   [employer] ([place].) [place].,  [place], [place] [phone] or [phone]   [facility] (RTS) [address] [place], [place] Accepts Medicaid  [facility] [place] [address],  [place] [phone] Substance Abuse/Addiction Treatment   [facility] Organization         Address  Phone  Notes  [facility]  [phone]   Domenic Schwab, PhD [address] Arlis Porta [place], [place]   [phone] or [phone]   [place], [place] [phone]   [facility] [address] [place], [place] [phone] Insurance/Medicaid/sponsorship through [facility] and Families [address] [place]                                    [place], [place] [phone] [place] [address], [place] [phone]    Dr. Adele Schilder  [phone]   [facility] Dept. 1) [address] [place] 2)  [place] 3)  [address], [place] [phone]  [phone]   [facility] Child Abuse Hotline [phone]) [phone] or [phone] (After Hours)

## 2015-02-24 ENCOUNTER — Ambulatory Visit (HOSPITAL_COMMUNITY)
Admission: RE | Admit: 2015-02-24 | Discharge: 2015-02-24 | Disposition: A | Discharge status: Home / Self Care | Payer: Medicaid Other | Source: Ambulatory Visit | Attending: Family Medicine | Admitting: Family Medicine

## 2015-02-24 DIAGNOSIS — M79605 Pain in left leg: Secondary | ICD-10-CM | POA: Insufficient documentation

## 2015-02-24 DIAGNOSIS — M79602 Pain in left arm: Secondary | ICD-10-CM

## 2015-02-24 DIAGNOSIS — M7989 Other specified soft tissue disorders: Secondary | ICD-10-CM | POA: Diagnosis not present

## 2015-02-24 DIAGNOSIS — N939 Abnormal uterine and vaginal bleeding, unspecified: Secondary | ICD-10-CM | POA: Diagnosis not present

## 2015-02-25 ENCOUNTER — Ambulatory Visit (HOSPITAL_COMMUNITY)
Admission: RE | Admit: 2015-02-25 | Discharge: 2015-02-25 | Disposition: A | Discharge status: Home / Self Care | Payer: Medicaid Other | Source: Ambulatory Visit | Attending: Medical | Admitting: Medical

## 2015-02-25 DIAGNOSIS — N939 Abnormal uterine and vaginal bleeding, unspecified: Secondary | ICD-10-CM | POA: Diagnosis not present

## 2015-02-26 ENCOUNTER — Encounter: Payer: Self-pay | Admitting: Family Medicine

## 2015-02-28 ENCOUNTER — Ambulatory Visit (INDEPENDENT_AMBULATORY_CARE_PROVIDER_SITE_OTHER): Payer: Medicaid Other | Admitting: Student

## 2015-02-28 VITALS — BP 123/78 | HR 91 | Temp 98.2°F | Ht 71.0 in | Wt 240.0 lb

## 2015-02-28 DIAGNOSIS — R760 Raised antibody titer: Secondary | ICD-10-CM

## 2015-02-28 DIAGNOSIS — R21 Rash and other nonspecific skin eruption: Secondary | ICD-10-CM

## 2015-02-28 MED ORDER — FEXOFENADINE HCL 30 MG PO TABS: 30.0000 mg | ORAL_TABLET | Freq: Two times a day (BID) | ORAL | Status: DC

## 2015-02-28 NOTE — Patient Instructions (Signed)
Follow up 2 weeks Please see Rheumatology at your scheduled visit this month If you have questions or concerns please call the office

## 2015-02-28 NOTE — Progress Notes (Signed)
Subjective:    Patient ID: Latoya Guzman, female    DOB: [birth year], 39 y.o.   MRN: [id number]   CC: rash on bilateral legs  HPI 39 y/o with PMH sig for concern for auto immune process with recurrent bilateral leg swelling, now presents for bilateral leg swelling, erythema and pain  Leg swelling - Started 02/27/2015 with swelling on bilateral calves, right swelling extending to thigh, but this has started to improve such that today are not swollen - Also noted redness and burning pain - She was seen in clinic on 10/25 for similar symptoms which resolved, had doppler studies on 10/31 which were negative for DVT - She has not tried anything for the pain - Denies fevers/chills, N/V/D  Review of Systems   See HPI for ROS.   Past Medical History  Diagnosis Date  . Blood transfusion 1998; 2015    Post C/S; related to anemia  . Depression     Was on Lexapro Stopped when pregnant  . Preterm labor     PTL last two pregnancies  . Fibroid 03/2010    Noted on [place] in Dec.  . 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 Tricuspid valve regurgitation 02/27/2010  . Lupus (HCC) Lupus Antigen     associated with pregnancy   . Sickle cell trait ([place])   . Anemia 12/19/2012  . Antiphospholipid antibody positive   . Chronic bronchitis ([place])   . Headache     "weekly" (01/01/2015)  . Anxiety   . Pica     toilet tissue   Past Surgical History  Procedure Laterality Date  . Cesarean section  1998, 2009  . Tubal ligation  11/14/2010  . Cesarean section  11/14/2010    Procedure: CESAREAN SECTION;  Surgeon: Juliene Pina C. Hulan Fray, MD;  Location: [facility];  Service: Gynecology;  Laterality: N/A;  Repeat cesarean section with delivery of baby boy at 37. Apgars 9/9. Bilateral tubal ligation with filshie clips.   . Dilation and curettage of uterus  2005, 2006    1st for twin loss, 2nd for retained placenta  . Esophagogastroduodenoscopy N/A 01/02/2015   Procedure: ESOPHAGOGASTRODUODENOSCOPY (EGD);  Surgeon: Manus Gunning, MD;  Location: [facility];  Service: Gastroenterology;  Laterality: N/A;   OB History    Gravida Para Term Preterm AB TAB SAB Ectopic Multiple Living   10 8 6 2 2  2  1 7      Social History   Social History  . Marital Status: Single    Spouse Name: N/A  . Number of Children: N/A  . Years of Education: N/A   Occupational History  . Not on file.   Social History Main Topics  . Smoking status: Never Smoker   . Smokeless tobacco: Never Used  . Alcohol Use: Yes     Comment: 01/01/2015 "might have a couple drinks/year"  . Drug Use: No  . Sexual Activity: Not Currently    Birth Control/ Protection: Surgical   Other Topics Concern  . Not on file   Social History Narrative    Objective:  BP 123/78 mmHg  Pulse 91  Temp(Src) 98.2 F (36.8 C) (Oral)  Ht 5\' 11"  (1.803 m)  Wt 240 lb (108.863 kg)  BMI 33.49 kg/m2  LMP 01/21/2015 Vitals and nursing note reviewed  General: NAD Cardiac: RRR,  Respiratory: CTAB, normal effort Skin: b/l LE with erythema distributed circumferentially around lower legs, tenderdess, no swelling  or warmth, no papules Neuro: alert and oriented, no focal deficits   Assessment & Plan:    Antiphospholipid antibody positive Recurrent LE erythema concerning for Rheum process given anti phospho lipid +, neg LE dopplers on 10/31 - Pt advised on continue supportive measures with tylenol for pain, elevation for swelling - She does have a Rheum appointment scheduled for this month. She was strongly encouraged to present to this     Ryanna Teschner A. Lincoln Brigham MD, [facility] Resident PGY-1 Pager [phone]

## 2015-03-03 DIAGNOSIS — R21 Rash and other nonspecific skin eruption: Secondary | ICD-10-CM | POA: Insufficient documentation

## 2015-03-03 NOTE — Assessment & Plan Note (Signed)
Recurrent LE erythema concerning for Rheum process given anti phospho lipid +, neg LE dopplers on 10/31 - Pt advised on continue supportive measures with tylenol for pain, elevation for swelling - She does have a Rheum appointment scheduled for this month. She was strongly encouraged to present to this

## 2015-03-06 ENCOUNTER — Telehealth: Payer: Self-pay | Admitting: Gastroenterology

## 2015-03-06 ENCOUNTER — Encounter: Payer: Self-pay | Admitting: Gastroenterology

## 2015-03-06 NOTE — Telephone Encounter (Signed)
Left message for pt to call back. Sent a letter in the mail.

## 2015-03-07 ENCOUNTER — Encounter: Payer: Self-pay | Admitting: Obstetrics & Gynecology

## 2015-03-07 ENCOUNTER — Other Ambulatory Visit (HOSPITAL_COMMUNITY)
Admission: RE | Admit: 2015-03-07 | Discharge: 2015-03-07 | Disposition: A | Discharge status: Home / Self Care | Payer: Medicaid Other | Source: Ambulatory Visit | Attending: Obstetrics & Gynecology | Admitting: Obstetrics & Gynecology

## 2015-03-07 ENCOUNTER — Ambulatory Visit (INDEPENDENT_AMBULATORY_CARE_PROVIDER_SITE_OTHER): Payer: Medicaid Other | Admitting: Obstetrics & Gynecology

## 2015-03-07 VITALS — BP 118/54 | HR 67 | Temp 98.0°F | Wt 242.2 lb

## 2015-03-07 DIAGNOSIS — N920 Excessive and frequent menstruation with regular cycle: Secondary | ICD-10-CM | POA: Diagnosis not present

## 2015-03-07 DIAGNOSIS — Z113 Encounter for screening for infections with a predominantly sexual mode of transmission: Secondary | ICD-10-CM | POA: Insufficient documentation

## 2015-03-07 DIAGNOSIS — N898 Other specified noninflammatory disorders of vagina: Secondary | ICD-10-CM | POA: Diagnosis present

## 2015-03-07 LAB — WET PREP, GENITAL
Trich, Wet Prep: NONE SEEN
Yeast Wet Prep HPF POC: NONE SEEN

## 2015-03-07 NOTE — Progress Notes (Signed)
Patient ID: Latoya Guzman, female   DOB: Aug 17, 1975, 39 y.o.   MRN: [id number]  Chief Complaint  Patient presents with  . Gynecologic Exam  heavy menses and notes vaginal discharge  HPI Latoya Guzman is a 39 y.o. female.  [id number] Patient's last menstrual period was 02/19/2015.  she has meavy menses lasting 8 days longstanding. She says that hormonal BCM were unreliable for cycle control but would consider her options again including Mirena.  HPI  Past Medical History  Diagnosis Date  . Blood transfusion 1998; 2015    Post C/S; related to anemia  . Depression     Was on Lexapro Stopped when pregnant  . Preterm labor     PTL last two pregnancies  . Fibroid 03/2010    Noted on [place] in Dec.  . 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 Tricuspid valve regurgitation 02/27/2010  . Lupus (HCC) Lupus Antigen     associated with pregnancy   . Sickle cell trait ([place])   . Anemia 12/19/2012  . Antiphospholipid antibody positive   . Chronic bronchitis ([place])   . Headache     "weekly" (01/01/2015)  . Anxiety   . Pica     toilet tissue    Past Surgical History  Procedure Laterality Date  . Cesarean section  1998, 2009  . Tubal ligation  11/14/2010  . Cesarean section  11/14/2010    Procedure: CESAREAN SECTION;  Surgeon: Juliene Pina C. Hulan Fray, MD;  Location: [facility];  Service: Gynecology;  Laterality: N/A;  Repeat cesarean section with delivery of baby boy at 96. Apgars 9/9. Bilateral tubal ligation with filshie clips.   . Dilation and curettage of uterus  2005, 2006    1st for twin loss, 2nd for retained placenta  . Esophagogastroduodenoscopy N/A 01/02/2015    Procedure: ESOPHAGOGASTRODUODENOSCOPY (EGD);  Surgeon: Manus Gunning, MD;  Location: [facility];  Service: Gastroenterology;  Laterality: N/A;    Family History  Problem Relation Age of Onset  . Hypertension Mother   . Lupus Sister   . Diabetes Maternal Uncle   .  Cancer Maternal Grandmother   . Colon cancer Neg Hx     Social History Social History  Substance Use Topics  . Smoking status: Never Smoker   . Smokeless tobacco: Never Used  . Alcohol Use: Yes     Comment: 01/01/2015 "might have a couple drinks/year"    No Known Allergies  Current Outpatient Prescriptions  Medication Sig Dispense Refill  . ferrous sulfate (FERROUSUL) 325 (65 FE) MG tablet Take 1 tablet (325 mg total) by mouth 2 (two) times daily after a meal. 60 tablet 0  . Multiple Vitamins-Minerals (MULTIVITAMIN ADULT PO) Take by mouth.    Marland Kitchen acetaminophen (TYLENOL) 500 MG tablet Take 1 tablet (500 mg total) by mouth every 6 (six) hours as needed. (Patient not taking: Reported on 03/07/2015) 30 tablet 0  . fexofenadine (ALLEGRA) 30 MG tablet Take 1 tablet (30 mg total) by mouth 2 (two) times daily. (Patient not taking: Reported on 03/07/2015) 30 tablet 5  . Na Sulfate-K Sulfate-Mg Sulf SOLN Take 1 kit by mouth once. suprep as directed. No substitutions. (Patient not taking: Reported on 02/21/2015) 354 mL 0  . polyethylene glycol (MIRALAX / GLYCOLAX) packet Take 17 g by mouth daily. (Patient not taking: Reported on 02/17/2015) 14 each 6  . triamcinolone ointment (KENALOG) 0.5 % Apply 1 application topically 2 (two) times daily. (  Patient not taking: Reported on 02/21/2015) 30 g 1   No current facility-administered medications for this visit.    Review of Systems Review of Systems  Genitourinary: Positive for vaginal discharge and menstrual problem. Negative for vaginal bleeding and pelvic pain.    Blood pressure 118/54, pulse 67, temperature 98 F (36.7 C), weight 242 lb 3.2 oz (109.861 kg), last menstrual period 02/19/2015.  Physical Exam Physical Exam  Constitutional: She is oriented to person, place, and time. She appears well-developed. No distress.  Cardiovascular: Normal rate.   Pulmonary/Chest: Effort normal. No respiratory distress.  Genitourinary: Uterus normal.  Vaginal discharge found.  Wet prep and STD test, no masses  Neurological: She is oriented to person, place, and time.  Skin: Skin is warm and dry.  Psychiatric: She has a normal mood and affect. Her behavior is normal.    Data Reviewed [place] result and pap 2015  Assessment Menorrhagia with regular cycle Normal ultrasound no lesions seen     Plan    Information on hormonal contraceptive/cycle control including Mirena was given and she will consider her options        Kristopher Attwood 03/07/2015, 10:26 AM

## 2015-03-07 NOTE — Patient Instructions (Signed)
Levonorgestrel intrauterine device (IUD) What is this medicine? LEVONORGESTREL IUD (LEE voe nor jes trel) is a contraceptive (birth control) device. The device is placed inside the uterus by a healthcare professional. It is used to prevent pregnancy and can also be used to treat heavy bleeding that occurs during your period. Depending on the device, it can be used for 3 to 5 years. This medicine may be used for other purposes; ask your health care provider or pharmacist if you have questions. What should I tell my health care provider before I take this medicine? They need to know if you have any of these conditions: -abnormal Pap smear -cancer of the breast, uterus, or cervix -diabetes -endometritis -genital or pelvic infection now or in the past -have more than one sexual partner or your partner has more than one partner -heart disease -history of an ectopic or tubal pregnancy -immune system problems -IUD in place -liver disease or tumor -problems with blood clots or take blood-thinners -use intravenous drugs -uterus of unusual shape -vaginal bleeding that has not been explained -an unusual or allergic reaction to levonorgestrel, other hormones, silicone, or polyethylene, medicines, foods, dyes, or preservatives -pregnant or trying to get pregnant -breast-feeding How should I use this medicine? This device is placed inside the uterus by a health care professional. Talk to your pediatrician regarding the use of this medicine in children. Special care may be needed. Overdosage: If you think you have taken too much of this medicine contact a poison control center or emergency room at once. NOTE: This medicine is only for you. Do not share this medicine with others. What if I miss a dose? This does not apply. What may interact with this medicine? Do not take this medicine with any of the following medications: -amprenavir -bosentan -fosamprenavir This medicine may also interact with  the following medications: -aprepitant -barbiturate medicines for inducing sleep or treating seizures -bexarotene -griseofulvin -medicines to treat seizures like carbamazepine, ethotoin, felbamate, oxcarbazepine, phenytoin, topiramate -modafinil -pioglitazone -rifabutin -rifampin -rifapentine -some medicines to treat HIV infection like atazanavir, indinavir, lopinavir, nelfinavir, tipranavir, ritonavir -St. John's wort -warfarin This list may not describe all possible interactions. Give your health care provider a list of all the medicines, herbs, non-prescription drugs, or dietary supplements you use. Also tell them if you smoke, drink alcohol, or use illegal drugs. Some items may interact with your medicine. What should I watch for while using this medicine? Visit your doctor or health care professional for regular check ups. See your doctor if you or your partner has sexual contact with others, becomes HIV positive, or gets a sexual transmitted disease. This product does not protect you against HIV infection (AIDS) or other sexually transmitted diseases. You can check the placement of the IUD yourself by reaching up to the top of your vagina with clean fingers to feel the threads. Do not pull on the threads. It is a good habit to check placement after each menstrual period. Call your doctor right away if you feel more of the IUD than just the threads or if you cannot feel the threads at all. The IUD may come out by itself. You may become pregnant if the device comes out. If you notice that the IUD has come out use a backup birth control method like condoms and call your health care provider. Using tampons will not change the position of the IUD and are okay to use during your period. What side effects may I notice from receiving this medicine?   Side effects that you should report to your doctor or health care professional as soon as possible: -allergic reactions like skin rash, itching or  hives, swelling of the face, lips, or tongue -fever, flu-like symptoms -genital sores -high blood pressure -no menstrual period for 6 weeks during use -pain, swelling, warmth in the leg -pelvic pain or tenderness -severe or sudden headache -signs of pregnancy -stomach cramping -sudden shortness of breath -trouble with balance, talking, or walking -unusual vaginal bleeding, discharge -yellowing of the eyes or skin Side effects that usually do not require medical attention (report to your doctor or health care professional if they continue or are bothersome): -acne -breast pain -change in sex drive or performance -changes in weight -cramping, dizziness, or faintness while the device is being inserted -headache -irregular menstrual bleeding within first 3 to 6 months of use -nausea This list may not describe all possible side effects. Call your doctor for medical advice about side effects. You may report side effects to FDA at 1-800-FDA-1088. Where should I keep my medicine? This does not apply. NOTE: This sheet is a summary. It may not cover all possible information. If you have questions about this medicine, talk to your doctor, pharmacist, or health care provider.    2016, Elsevier/Gold Standard. (2011-05-13 13:54:04) Ethinyl Estradiol; Etonogestrel vaginal ring What is this medicine? ETHINYL ESTRADIOL; ETONOGESTREL (ETH in il es tra DYE ole; et oh noe JES trel) vaginal ring is a flexible, vaginal ring used as a contraceptive (birth control method). This medicine combines two types of female hormones, an estrogen and a progestin. This ring is used to prevent ovulation and pregnancy. Each ring is effective for one month. This medicine may be used for other purposes; ask your health care provider or pharmacist if you have questions. What should I tell my health care provider before I take this medicine? They need to know if you have or ever had any of these conditions: -abnormal  vaginal bleeding -blood vessel disease or blood clots -breast, cervical, endometrial, ovarian, liver, or uterine cancer -diabetes -gallbladder disease -heart disease or recent heart attack -high blood pressure -high cholesterol -kidney disease -liver disease -migraine headaches -stroke -systemic lupus erythematosus (SLE) -tobacco smoker -an unusual or allergic reaction to estrogens, progestins, other medicines, foods, dyes, or preservatives -pregnant or trying to get pregnant -breast-feeding How should I use this medicine? Insert the ring into your vagina as directed. Follow the directions on the prescription label. The ring will remain place for 3 weeks and is then removed for a 1-week break. A new ring is inserted 1 week after the last ring was removed, on the same day of the week. Do not use more often than directed. A patient package insert for the product will be given with each prescription and refill. Read this sheet carefully each time. The sheet may change frequently. Contact your pediatrician regarding the use of this medicine in children. Special care may be needed. This medicine has been used in female children who have started having menstrual periods. Overdosage: If you think you have taken too much of this medicine contact a poison control center or emergency room at once. NOTE: This medicine is only for you. Do not share this medicine with others. What if I miss a dose? You will need to replace your vaginal ring once a month as directed. If the ring should slip out, or if you leave it in longer or shorter than you should, contact your health care professional for advice. What  may interact with this medicine? -acetaminophen -antibiotics or medicines for infections, especially rifampin, rifabutin, rifapentine, and griseofulvin, and possibly penicillins or tetracyclines -aprepitant -ascorbic acid (vitamin C) -atorvastatin -barbiturate medicines, such as  phenobarbital -bosentan -carbamazepine -caffeine -clofibrate -cyclosporine -dantrolene -doxercalciferol -felbamate -grapefruit juice -hydrocortisone -medicines for anxiety or sleeping problems, such as diazepam or temazepam -medicines for diabetes, including pioglitazone -modafinil -mycophenolate -nefazodone -oxcarbazepine -phenytoin -prednisolone -ritonavir or other medicines for HIV infection or AIDS -rosuvastatin -selegiline -soy isoflavones supplements -St. John's wort -tamoxifen or raloxifene -theophylline -thyroid hormones -topiramate -warfarin This list may not describe all possible interactions. Give your health care provider a list of all the medicines, herbs, non-prescription drugs, or dietary supplements you use. Also tell them if you smoke, drink alcohol, or use illegal drugs. Some items may interact with your medicine. What should I watch for while using this medicine? Visit your doctor or health care professional for regular checks on your progress. You will need a regular breast and pelvic exam and Pap smear while on this medicine. Use an additional method of contraception during the first cycle that you use this ring. If you have any reason to think you are pregnant, stop using this medicine right away and contact your doctor or health care professional. If you are using this medicine for hormone related problems, it may take several cycles of use to see improvement in your condition. Smoking increases the risk of getting a blood clot or having a stroke while you are using hormonal birth control, especially if you are more than 37 years old. You are strongly advised not to smoke. This medicine can make your body retain fluid, making your fingers, hands, or ankles swell. Your blood pressure can go up. Contact your doctor or health care professional if you feel you are retaining fluid. This medicine can make you more sensitive to the sun. Keep out of the sun. If you  cannot avoid being in the sun, wear protective clothing and use sunscreen. Do not use sun lamps or tanning beds/booths. If you wear contact lenses and notice visual changes, or if the lenses begin to feel uncomfortable, consult your eye care specialist. In some women, tenderness, swelling, or minor bleeding of the gums may occur. Notify your dentist if this happens. Brushing and flossing your teeth regularly may help limit this. See your dentist regularly and inform your dentist of the medicines you are taking. If you are going to have elective surgery, you may need to stop using this medicine before the surgery. Consult your health care professional for advice. This medicine does not protect you against HIV infection (AIDS) or any other sexually transmitted diseases. What side effects may I notice from receiving this medicine? Side effects that you should report to your doctor or health care professional as soon as possible: -breast tissue changes or discharge -changes in vaginal bleeding during your period or between your periods -chest pain -coughing up blood -dizziness or fainting spells -headaches or migraines -leg, arm or groin pain -severe or sudden headaches -stomach pain (severe) -sudden shortness of breath -sudden loss of coordination, especially on one side of the body -speech problems -symptoms of vaginal infection like itching, irritation or unusual discharge -tenderness in the upper abdomen -vomiting -weakness or numbness in the arms or legs, especially on one side of the body -yellowing of the eyes or skin Side effects that usually do not require medical attention (report to your doctor or health care professional if they continue or are bothersome): -breakthrough bleeding  and spotting that continues beyond the 3 initial cycles of pills -breast tenderness -mood changes, anxiety, depression, frustration, anger, or emotional outbursts -increased sensitivity to sun or  ultraviolet light -nausea -skin rash, acne, or brown spots on the skin -weight gain (slight) This list may not describe all possible side effects. Call your doctor for medical advice about side effects. You may report side effects to FDA at 1-800-FDA-1088. Where should I keep my medicine? Keep out of the reach of children. Store at room temperature between 15 and 30 degrees C (59 and 86 degrees F) for up to 4 months. The product will expire after 4 months. Protect from light. Throw away any unused medicine after the expiration date. NOTE: This sheet is a summary. It may not cover all possible information. If you have questions about this medicine, talk to your doctor, pharmacist, or health care provider.    2016, Elsevier/Gold Standard. (2008-03-28 12:03:58) Oral Contraception Use Oral contraceptive pills (OCPs) are medicines taken to prevent pregnancy. OCPs work by preventing the ovaries from releasing eggs. The hormones in OCPs also cause the cervical mucus to thicken, preventing the sperm from entering the uterus. The hormones also cause the uterine lining to become thin, not allowing a fertilized egg to attach to the inside of the uterus. OCPs are highly effective when taken exactly as prescribed. However, OCPs do not prevent sexually transmitted diseases (STDs). Safe sex practices, such as using condoms along with an OCP, can help prevent STDs. Before taking OCPs, you may have a physical exam and Pap test. Your health care provider may also order blood tests if necessary. Your health care provider will make sure you are a good candidate for oral contraception. Discuss with your health care provider the possible side effects of the OCP you may be prescribed. When starting an OCP, it can take 2 to 3 months for the body to adjust to the changes in hormone levels in your body.  HOW TO TAKE ORAL CONTRACEPTIVE PILLS Your health care provider may advise you on how to start taking the first cycle of  OCPs. Otherwise, you can:   Start on day 1 of your menstrual period. You will not need any backup contraceptive protection with this start time.   Start on the first Sunday after your menstrual period or the day you get your prescription. In these cases, you will need to use backup contraceptive protection for the first week.   Start the pill at any time of your cycle. If you take the pill within 5 days of the start of your period, you are protected against pregnancy right away. In this case, you will not need a backup form of birth control. If you start at any other time of your menstrual cycle, you will need to use another form of birth control for 7 days. If your OCP is the type called a minipill, it will protect you from pregnancy after taking it for 2 days (48 hours). After you have started taking OCPs:   If you forget to take 1 pill, take it as soon as you remember. Take the next pill at the regular time.   If you miss 2 or more pills, call your health care provider because different pills have different instructions for missed doses. Use backup birth control until your next menstrual period starts.   If you use a 28-day pack that contains inactive pills and you miss 1 of the last 7 pills (pills with no hormones), it will  not matter. Throw away the rest of the non-hormone pills and start a new pill pack.  No matter which day you start the OCP, you will always start a new pack on that same day of the week. Have an extra pack of OCPs and a backup contraceptive method available in case you miss some pills or lose your OCP pack.  HOME CARE INSTRUCTIONS   Do not smoke.   Always use a condom to protect against STDs. OCPs do not protect against STDs.   Use a calendar to mark your menstrual period days.   Read the information and directions that came with your OCP. Talk to your health care provider if you have questions.  SEEK MEDICAL CARE IF:   You develop nausea and vomiting.    You have abnormal vaginal discharge or bleeding.   You develop a rash.   You miss your menstrual period.   You are losing your hair.   You need treatment for mood swings or depression.   You get dizzy when taking the OCP.   You develop acne from taking the OCP.   You become pregnant.  SEEK IMMEDIATE MEDICAL CARE IF:   You develop chest pain.   You develop shortness of breath.   You have an uncontrolled or severe headache.   You develop numbness or slurred speech.   You develop visual problems.   You develop pain, redness, and swelling in the legs.    This information is not intended to replace advice given to you by your health care provider. Make sure you discuss any questions you have with your health care provider.   Document Released: 04/01/2011 Document Revised: 05/03/2014 Document Reviewed: 10/01/2012 Elsevier Interactive Patient Education [employer].

## 2015-03-10 LAB — GC/CHLAMYDIA PROBE AMP (~~LOC~~) NOT AT ARMC
Chlamydia: NEGATIVE
Neisseria Gonorrhea: POSITIVE — AB

## 2015-03-11 ENCOUNTER — Encounter: Payer: Self-pay | Admitting: Family Medicine

## 2015-03-11 ENCOUNTER — Ambulatory Visit (INDEPENDENT_AMBULATORY_CARE_PROVIDER_SITE_OTHER): Payer: Medicaid Other | Admitting: Family Medicine

## 2015-03-11 ENCOUNTER — Telehealth: Payer: Self-pay

## 2015-03-11 VITALS — BP 146/84 | HR 88 | Temp 98.1°F | Wt 238.0 lb

## 2015-03-11 DIAGNOSIS — H5713 Ocular pain, bilateral: Secondary | ICD-10-CM | POA: Diagnosis not present

## 2015-03-11 DIAGNOSIS — R760 Raised antibody titer: Secondary | ICD-10-CM | POA: Diagnosis not present

## 2015-03-11 DIAGNOSIS — R21 Rash and other nonspecific skin eruption: Secondary | ICD-10-CM | POA: Diagnosis present

## 2015-03-11 NOTE — Progress Notes (Addendum)
Date of Visit: 03/11/2015   HPI:  Pt presents for a same day appointment to discuss eye issues.  Yesterday awoke with a "knot" on the medial aspect of her L eye. States this extended to covering her iris/pupil.  Had associated bad headache. R eye aching with some discomfort as well. Had blurry vision medially bilaterally. Denies current blurry vision or photophobia. Did have a "black spot" in her vision yesterday, lasting for about a minute.   Reports about 2.5 weeks of awakening with bloodshot eyes. Last week her eyes felt very itchy. 2 days ago felt like she had something in her eye and scratched.   Also reports that about a month ago had episode of R eyeball twitching to the R, associated with R hand shaking. This lasted for about 2.5 weeks.  ROS: See HPI  [place]: depression, tricuspid & pulmonary regurgitation, iron deficiency anemia, antiphospholipid antibody positive  PHYSICAL EXAM: BP 146/84 mmHg  Pulse 88  Temp(Src) 98.1 F (36.7 C) (Oral)  Wt 238 lb (107.956 kg)  LMP 02/19/2015  Visual acuity: L 20/40  R 20/50  B 20/20  Gen: NAD, pleasant, cooperative HEENT: normocephalic, atraumatic. extraocular movements intact without nystagmus but patient does endorse discomfort with eye movements. Medial aspect of L sclera injected with nodular light pink lesion just medial to iris. R sclera also injected. Neither eye has perilimbal erythema. pupils equal round and reactive to light. No eye drainage. Visualized portion of fundus normal bilaterally although exam limited as eyes not dilated  ASSESSMENT/PLAN:  39 yo F with history of antiphospholipid antibody syndrome, presenting with multiple eye complaints, including discomfort, itching, erythema, nodule, and some vision changes. Also endorsing what may have been horizontal nystagmus one month ago. As symptoms have worsened in the last 24-48 hours, warrants urgent evaluation by ophthalmology. Patient was scheduled for an appointment at  10:30am at [facility] and will go directly to their office to be seen.  FOLLOW UP: Going directly to ophtho appointment for further evaluation  Latoya Guzman, [place]

## 2015-03-11 NOTE — Patient Instructions (Signed)
Please go to [facility] They are expecting you at 10:30a Dr. Midge Aver [address], [place], [place] [address] [phone]  Be well, Dr. Ardelia Mems

## 2015-03-11 NOTE — Telephone Encounter (Signed)
Called pt and notified her that she tested positive for Gonorrhea.  I informed pt to come to the office to get treatment.  Pt stated that she will be able to come in on 03/12/15 @ 1030.   I also advised pt to have her partner(s) treated as well.  Pt stated understanding with no further questions.

## 2015-03-12 ENCOUNTER — Ambulatory Visit (INDEPENDENT_AMBULATORY_CARE_PROVIDER_SITE_OTHER): Payer: Medicaid Other | Admitting: General Practice

## 2015-03-12 VITALS — BP 128/70 | HR 74 | Temp 97.6°F | Ht 71.0 in | Wt 240.7 lb

## 2015-03-12 DIAGNOSIS — A549 Gonococcal infection, unspecified: Secondary | ICD-10-CM

## 2015-03-12 MED ORDER — AZITHROMYCIN 250 MG PO TABS
1000.0000 mg | ORAL_TABLET | Freq: Once | ORAL | Status: AC
  Administered 2015-03-12: 1000 mg via ORAL

## 2015-03-12 MED ORDER — CEFTRIAXONE SODIUM 1 G IJ SOLR
250.0000 mg | Freq: Once | INTRAMUSCULAR | Status: AC
  Administered 2015-03-12: 250 mg via INTRAMUSCULAR

## 2015-04-04 ENCOUNTER — Ambulatory Visit: Payer: Medicaid Other | Admitting: Obstetrics & Gynecology

## 2015-04-17 ENCOUNTER — Ambulatory Visit: Payer: Medicaid Other | Admitting: Obstetrics & Gynecology

## 2015-05-02 ENCOUNTER — Ambulatory Visit: Payer: Medicaid Other | Admitting: Family Medicine

## 2015-05-23 ENCOUNTER — Ambulatory Visit (INDEPENDENT_AMBULATORY_CARE_PROVIDER_SITE_OTHER): Payer: Medicaid Other | Admitting: Family Medicine

## 2015-05-23 ENCOUNTER — Encounter: Payer: Self-pay | Admitting: Family Medicine

## 2015-05-23 VITALS — BP 114/62 | HR 63 | Temp 98.5°F | Ht 71.0 in | Wt 247.9 lb

## 2015-05-23 DIAGNOSIS — N939 Abnormal uterine and vaginal bleeding, unspecified: Secondary | ICD-10-CM

## 2015-05-23 DIAGNOSIS — R760 Raised antibody titer: Secondary | ICD-10-CM

## 2015-05-23 DIAGNOSIS — Z3202 Encounter for pregnancy test, result negative: Secondary | ICD-10-CM

## 2015-05-23 LAB — POCT PREGNANCY, URINE: Preg Test, Ur: NEGATIVE

## 2015-05-23 MED ORDER — ETONOGESTREL 68 MG ~~LOC~~ IMPL
68.0000 mg | DRUG_IMPLANT | Freq: Once | SUBCUTANEOUS | Status: AC
  Administered 2015-05-23: 68 mg via SUBCUTANEOUS

## 2015-05-23 NOTE — Progress Notes (Signed)
   Subjective:    Patient ID: Latoya Guzman, female    DOB: [birth year], 40 y.o.   MRN: [id number]  HPI Patient seen due to heavy menstrual cycle that lasts 8 days of heavy leading. She has a history of severe anemia requiring transfusions. She was previously seen on 03/07/15.  It was counseled regarding birth control options. She revised that likely be her best avenue for decreasing her menstrual flow. She does not wish to have an IUD. she has had Depo-Provera, and Implanon, OCPs. She continued to have bleeding with all of these.    Review of Systems  Constitutional: Negative for fever, chills and fatigue.  Gastrointestinal: Negative for nausea, vomiting, abdominal pain and diarrhea.  Genitourinary: Negative for vaginal pain and pelvic pain.      Objective:   Physical Exam  Constitutional: She appears well-developed and well-nourished.  Cardiovascular: Normal rate, regular rhythm and normal heart sounds.  Exam reveals no gallop and no friction rub.   No murmur heard. Pulmonary/Chest: Effort normal and breath sounds normal. No respiratory distress. She has no wheezes. She has no rales. She exhibits no tenderness.       Assessment & Plan:  1. Abnormal uterine bleeding (AUB) 2. Antiphospholipid antibody positive Patient has a antiphospholipid antibody that is weakly positive. Although this should not increase the likelihood of her having a VTE, preference would be to maintain on a progesterone only birth control option. We'll try the Nexplanon.  Follow-up in 6 months.    Nexplanon Insertion:  Patient given informed consent, signed copy in the chart, time out was performed. Pregnancy test was negative. Appropriate time out taken.  Patient's left arm was prepped and draped in the usual sterile fashion.. The ruler used to measure and mark insertion area.  Pt was prepped with alcohol swab and then injected with 3 cc of 2% lidocaine with epinephrine.  Pt was prepped with betadine, Implanon  removed form packaging,  Device confirmed in needle, then inserted full length of needle and withdrawn per handbook instructions.  Device palpated by physician and patient.  Pt insertion site covered with coban.   Minimal blood loss.  Pt tolerated the procedure well.

## 2015-05-23 NOTE — Patient Instructions (Signed)
Etonogestrel implant What is this medicine? ETONOGESTREL (et oh noe JES trel) is a contraceptive (birth control) device. It is used to prevent pregnancy. It can be used for up to 3 years. This medicine may be used for other purposes; ask your health care provider or pharmacist if you have questions. What should I tell my health care provider before I take this medicine? They need to know if you have any of these conditions: -abnormal vaginal bleeding -blood vessel disease or blood clots -cancer of the breast, cervix, or liver -depression -diabetes -gallbladder disease -headaches -heart disease or recent heart attack -high blood pressure -high cholesterol -kidney disease -liver disease -renal disease -seizures -tobacco smoker -an unusual or allergic reaction to etonogestrel, other hormones, anesthetics or antiseptics, medicines, foods, dyes, or preservatives -pregnant or trying to get pregnant -breast-feeding How should I use this medicine? This device is inserted just under the skin on the inner side of your upper arm by a health care professional. Talk to your pediatrician regarding the use of this medicine in children. Special care may be needed. Overdosage: If you think you have taken too much of this medicine contact a poison control center or emergency room at once. NOTE: This medicine is only for you. Do not share this medicine with others. What if I miss a dose? This does not apply. What may interact with this medicine? Do not take this medicine with any of the following medications: -amprenavir -bosentan -fosamprenavir This medicine may also interact with the following medications: -barbiturate medicines for inducing sleep or treating seizures -certain medicines for fungal infections like ketoconazole and itraconazole -griseofulvin -medicines to treat seizures like carbamazepine, felbamate, oxcarbazepine, phenytoin,  topiramate -modafinil -phenylbutazone -rifampin -some medicines to treat HIV infection like atazanavir, indinavir, lopinavir, nelfinavir, tipranavir, ritonavir -St. John's wort This list may not describe all possible interactions. Give your health care provider a list of all the medicines, herbs, non-prescription drugs, or dietary supplements you use. Also tell them if you smoke, drink alcohol, or use illegal drugs. Some items may interact with your medicine. What should I watch for while using this medicine? This product does not protect you against HIV infection (AIDS) or other sexually transmitted diseases. You should be able to feel the implant by pressing your fingertips over the skin where it was inserted. Contact your doctor if you cannot feel the implant, and use a non-hormonal birth control method (such as condoms) until your doctor confirms that the implant is in place. If you feel that the implant may have broken or become bent while in your arm, contact your healthcare provider. What side effects may I notice from receiving this medicine? Side effects that you should report to your doctor or health care professional as soon as possible: -allergic reactions like skin rash, itching or hives, swelling of the face, lips, or tongue -breast lumps -changes in emotions or moods -depressed mood -heavy or prolonged menstrual bleeding -pain, irritation, swelling, or bruising at the insertion site -scar at site of insertion -signs of infection at the insertion site such as fever, and skin redness, pain or discharge -signs of pregnancy -signs and symptoms of a blood clot such as breathing problems; changes in vision; chest pain; severe, sudden headache; pain, swelling, warmth in the leg; trouble speaking; sudden numbness or weakness of the face, arm or leg -signs and symptoms of liver injury like dark yellow or brown urine; general ill feeling or flu-like symptoms; light-colored stools; loss of  appetite; nausea; right upper belly   pain; unusually weak or tired; yellowing of the eyes or skin -unusual vaginal bleeding, discharge -signs and symptoms of a stroke like changes in vision; confusion; trouble speaking or understanding; severe headaches; sudden numbness or weakness of the face, arm or leg; trouble walking; dizziness; loss of balance or coordination Side effects that usually do not require medical attention (Report these to your doctor or health care professional if they continue or are bothersome.): -acne -back pain -breast pain -changes in weight -dizziness -general ill feeling or flu-like symptoms -headache -irregular menstrual bleeding -nausea -sore throat -vaginal irritation or inflammation This list may not describe all possible side effects. Call your doctor for medical advice about side effects. You may report side effects to FDA at 1-800-FDA-1088. Where should I keep my medicine? This drug is given in a hospital or clinic and will not be stored at home. NOTE: This sheet is a summary. It may not cover all possible information. If you have questions about this medicine, talk to your doctor, pharmacist, or health care provider.    2016, Elsevier/Gold Standard. (2014-01-25 14:07:06)  

## 2015-05-23 NOTE — Addendum Note (Signed)
Addended by: Michel Harrow on: 05/23/2015 10:31 AM   Modules accepted: Orders

## 2015-06-05 ENCOUNTER — Ambulatory Visit: Payer: Medicaid Other | Admitting: Advanced Practice Midwife

## 2015-06-23 ENCOUNTER — Ambulatory Visit: Payer: Medicaid Other | Admitting: Medical

## 2015-07-04 ENCOUNTER — Inpatient Hospital Stay (HOSPITAL_COMMUNITY)
Admission: AD | Admit: 2015-07-04 | Discharge: 2015-07-04 | Disposition: A | Discharge status: Home / Self Care | Payer: Medicaid Other | Source: Ambulatory Visit | Attending: Family Medicine | Admitting: Family Medicine

## 2015-07-04 ENCOUNTER — Encounter (HOSPITAL_COMMUNITY): Payer: Self-pay | Admitting: *Deleted

## 2015-07-04 DIAGNOSIS — Z113 Encounter for screening for infections with a predominantly sexual mode of transmission: Secondary | ICD-10-CM | POA: Insufficient documentation

## 2015-07-04 DIAGNOSIS — Z3202 Encounter for pregnancy test, result negative: Secondary | ICD-10-CM | POA: Insufficient documentation

## 2015-07-04 DIAGNOSIS — N939 Abnormal uterine and vaginal bleeding, unspecified: Secondary | ICD-10-CM | POA: Insufficient documentation

## 2015-07-04 DIAGNOSIS — D573 Sickle-cell trait: Secondary | ICD-10-CM | POA: Diagnosis not present

## 2015-07-04 LAB — URINALYSIS, ROUTINE W REFLEX MICROSCOPIC
Bilirubin Urine: NEGATIVE
GLUCOSE, UA: NEGATIVE mg/dL
KETONES UR: NEGATIVE mg/dL
Leukocytes, UA: NEGATIVE
Nitrite: NEGATIVE
PROTEIN: NEGATIVE mg/dL
Specific Gravity, Urine: 1.02 (ref 1.005–1.030)
pH: 6.5 (ref 5.0–8.0)

## 2015-07-04 LAB — CBC
HEMATOCRIT: 30.3 % — AB (ref 36.0–46.0)
Hemoglobin: 10.3 g/dL — ABNORMAL LOW (ref 12.0–15.0)
MCH: 24 pg — AB (ref 26.0–34.0)
MCHC: 34 g/dL (ref 30.0–36.0)
MCV: 70.5 fL — ABNORMAL LOW (ref 78.0–100.0)
Platelets: 276 10*3/uL (ref 150–400)
RBC: 4.3 MIL/uL (ref 3.87–5.11)
RDW: 18.8 % — AB (ref 11.5–15.5)
WBC: 6.7 10*3/uL (ref 4.0–10.5)

## 2015-07-04 LAB — WET PREP, GENITAL
CLUE CELLS WET PREP: NONE SEEN
SPERM: NONE SEEN
Trich, Wet Prep: NONE SEEN
YEAST WET PREP: NONE SEEN

## 2015-07-04 LAB — POCT PREGNANCY, URINE: PREG TEST UR: NEGATIVE

## 2015-07-04 LAB — URINE MICROSCOPIC-ADD ON

## 2015-07-04 MED ORDER — MEDROXYPROGESTERONE ACETATE 5 MG PO TABS: 5.0000 mg | ORAL_TABLET | Freq: Every day | ORAL | Status: DC

## 2015-07-04 NOTE — MAU Provider Note (Signed)
History     CSN: [id number]  Arrival date and time: 07/04/15 1652   First Provider Initiated Contact with Patient 07/04/15 1758         Chief Complaint  Patient presents with  . Vaginal Bleeding  . Fatigue   HPI Latoya Guzman is a 40 y.o. female who presents for heavy vaginal bleeding. Had nexplanon place in January to treat her heavy periods. Started spotting after having it placed and never stopped. For the last 10 days bleeding has increased & today passed a large clot. Denies abdominal pain. Does reports feeling tired and dizzy at time. Is not taking her iron supplements on a regular basis.   Was treated for gonorrhea in November. Partner was treated as well & she waited the appropriate time for intercourse but is requesting to be retested for [facility] today.   OB History    Gravida Para Term Preterm AB TAB SAB Ectopic Multiple Living   _0 0 7      Past Medical History  Diagnosis Date  . Blood transfusion 1998; 2015    Post C/S; related to anemia  . Depression     Was on Lexapro Stopped when pregnant  . Preterm labor     PTL last two pregnancies  . Fibroid 03/2010    Noted on [place] in Dec.  . 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 Tricuspid valve regurgitation 02/27/2010  . Lupus (HCC) Lupus Antigen     associated with pregnancy   . Sickle cell trait ([place])   . Anemia 12/19/2012  . Antiphospholipid antibody positive   . Chronic bronchitis ([place])   . Headache     "weekly" (01/01/2015)  . Anxiety   . Pica     toilet tissue    Past Surgical History  Procedure Laterality Date  . Cesarean section  1998, 2009  . Tubal ligation  11/14/2010  . Cesarean section  11/14/2010    Procedure: CESAREAN SECTION;  Surgeon: Juliene Pina C. Hulan Fray, MD;  Location: [facility];  Service: Gynecology;  Laterality: N/A;  Repeat cesarean section with delivery of baby boy at 49. Apgars 9/9. Bilateral tubal ligation with filshie clips.    . Dilation and curettage of uterus  2005, 2006    1st for twin loss, 2nd for retained placenta  . Esophagogastroduodenoscopy N/A 01/02/2015    Procedure: ESOPHAGOGASTRODUODENOSCOPY (EGD);  Surgeon: Manus Gunning, MD;  Location: [place];  Service: Gastroenterology;  Laterality: N/A;    Family History  Problem Relation Age of Onset  . Hypertension Mother   . Lupus Sister   . Diabetes Maternal Uncle   . Cancer Maternal Grandmother   . Colon cancer Neg Hx     Social History  Substance Use Topics  . Smoking status: Never Smoker   . Smokeless tobacco: Never Used  . Alcohol Use: Yes     Comment: 01/01/2015 "might have a couple drinks/year"    Allergies: No Known Allergies  Prescriptions prior to admission  Medication Sig Dispense Refill Last Dose  . ferrous sulfate (FERROUSUL) 325 (65 FE) MG tablet Take 1 tablet (325 mg total) by mouth 2 (two) times daily after a meal. 60 tablet 0 07/03/2015 at Unknown time  . acetaminophen (TYLENOL) 500 MG tablet Take 1 tablet (500 mg total) by mouth every 6 (six) hours as needed. (Patient not taking: Reported on 03/07/2015) 30 tablet 0 Not  Taking  . fexofenadine (ALLEGRA) 30 MG tablet Take 1 tablet (30 mg total) by mouth 2 (two) times daily. (Patient not taking: Reported on 03/07/2015) 30 tablet 5 Not Taking  . Multiple Vitamins-Minerals (HAIR SKIN AND NAILS FORMULA) TABS Take 1 tablet by mouth daily.   07/01/2015  . Na Sulfate-K Sulfate-Mg Sulf SOLN Take 1 kit by mouth once. suprep as directed. No substitutions. (Patient not taking: Reported on 02/21/2015) 354 mL 0 Not Taking  . polyethylene glycol (MIRALAX / GLYCOLAX) packet Take 17 g by mouth daily. (Patient not taking: Reported on 02/17/2015) 14 each 6 Not Taking  . triamcinolone ointment (KENALOG) 0.5 % Apply 1 application topically 2 (two) times daily. (Patient not taking: Reported on 02/21/2015) 30 g 1 Not Taking    Review of Systems  Constitutional: Positive for malaise/fatigue.  Negative for fever.  Respiratory: Negative.   Cardiovascular: Negative.   Gastrointestinal: Negative.   Genitourinary:       + vaginal bleeding  Neurological: Positive for weakness.   Physical Exam   Blood pressure 139/66, pulse 84, temperature 98.3 F (36.8 C), temperature source Oral, resp. rate 18, weight 251 lb 6.4 oz (114.034 kg), last menstrual period 05/31/2015. Orthostatic VS for the past 24 hrs:  BP- Lying Pulse- Lying BP- Sitting Pulse- Sitting BP- Standing at 0 minutes Pulse- Standing at 0 minutes  07/04/15 1729 117/57 mmHg 76 117/62 mmHg 83 133/74 mmHg 91      Physical Exam  Nursing note and vitals reviewed. Constitutional: She is oriented to person, place, and time. She appears well-developed and well-nourished. No distress.  HENT:  Head: Normocephalic and atraumatic.  Eyes: Conjunctivae are normal. Right eye exhibits no discharge. Left eye exhibits no discharge. No scleral icterus.  Neck: Normal range of motion.  Cardiovascular: Normal rate, regular rhythm and normal heart sounds.   No murmur heard. Respiratory: Effort normal and breath sounds normal. No respiratory distress. She has no wheezes.  GI: Soft.  Genitourinary: There is bleeding (moderate amount of dark red blood) in the vagina.  Neurological: She is alert and oriented to person, place, and time.  Skin: Skin is warm and dry. She is not diaphoretic.  Psychiatric: She has a normal mood and affect. Her behavior is normal. Judgment and thought content normal.    MAU Course  Procedures Results for orders placed or performed during the hospital encounter of 07/04/15 (from the past 24 hour(s))  Urinalysis, Routine w reflex microscopic (not at [facility])     Status: Abnormal   Collection Time: 07/04/15  5:10 PM  Result Value Ref Range   Color, Urine YELLOW YELLOW   APPearance HAZY (A) CLEAR   Specific Gravity, Urine 1.020 1.005 - 1.030   pH 6.5 5.0 - 8.0   Glucose, UA NEGATIVE NEGATIVE mg/dL   Hgb urine  dipstick LARGE (A) NEGATIVE   Bilirubin Urine NEGATIVE NEGATIVE   Ketones, ur NEGATIVE NEGATIVE mg/dL   Protein, ur NEGATIVE NEGATIVE mg/dL   Nitrite NEGATIVE NEGATIVE   Leukocytes, UA NEGATIVE NEGATIVE  Urine microscopic-add on     Status: Abnormal   Collection Time: 07/04/15  5:10 PM  Result Value Ref Range   Squamous Epithelial / LPF 0-5 (A) NONE SEEN   WBC, UA 0-5 0 - 5 WBC/hpf   RBC / HPF 6-30 0 - 5 RBC/hpf   Bacteria, UA RARE (A) NONE SEEN  CBC     Status: Abnormal   Collection Time: 07/04/15  5:24 PM  Result Value Ref Range  WBC 6.7 4.0 - 10.5 K/uL   RBC 4.30 3.87 - 5.11 MIL/uL   Hemoglobin 10.3 (L) 12.0 - 15.0 g/dL   HCT 30.3 (L) 36.0 - 46.0 %   MCV 70.5 (L) 78.0 - 100.0 fL   MCH 24.0 (L) 26.0 - 34.0 pg   MCHC 34.0 30.0 - 36.0 g/dL   RDW 18.8 (H) 11.5 - 15.5 %   Platelets 276 150 - 400 K/uL  Pregnancy, urine POC     Status: None   Collection Time: 07/04/15  5:24 PM  Result Value Ref Range   Preg Test, Ur NEGATIVE NEGATIVE  Wet prep, genital     Status: Abnormal   Collection Time: 07/04/15  6:40 PM  Result Value Ref Range   Yeast Wet Prep HPF POC NONE SEEN NONE SEEN   Trich, Wet Prep NONE SEEN NONE SEEN   Clue Cells Wet Prep HPF POC NONE SEEN NONE SEEN   WBC, Wet Prep HPF POC MODERATE (A) NONE SEEN   Sperm NONE SEEN     MDM Hemoglobin stable UPT negative Orthostatic VS WNL Discussed patient with Dr. Nehemiah Settle as he placed her nexplanon. Will rx provera x 2 months & pt will f/u with him.  Assessment and Plan  A: 1. Abnormal uterine bleeding (AUB)   2. Screen for STD (sexually transmitted disease)     P: Discharge home Rx provera GC/CT pending Continue taking iron supplement & increase iron rich foods  Jorje Guild 07/04/2015, 5:46 PM

## 2015-07-04 NOTE — Discharge Instructions (Signed)
Abnormal Uterine Bleeding Abnormal uterine bleeding can affect women at various stages in life, including teenagers, women in their reproductive years, pregnant women, and women who have reached menopause. Several kinds of uterine bleeding are considered abnormal, including:  Bleeding or spotting between periods.   Bleeding after sexual intercourse.   Bleeding that is heavier or more than normal.   Periods that last longer than usual.  Bleeding after menopause.  Many cases of abnormal uterine bleeding are minor and simple to treat, while others are more serious. Any type of abnormal bleeding should be evaluated by your health care provider. Treatment will depend on the cause of the bleeding. HOME CARE INSTRUCTIONS Monitor your condition for any changes. The following actions may help to alleviate any discomfort you are experiencing:  Avoid the use of tampons and douches as directed by your health care provider.  Change your pads frequently. You should get regular pelvic exams and Pap tests. Keep all follow-up appointments for diagnostic tests as directed by your health care provider.  SEEK MEDICAL CARE IF:   Your bleeding lasts more than 1 week.   You feel dizzy at times.  SEEK IMMEDIATE MEDICAL CARE IF:   You pass out.   You are changing pads every 15 to 30 minutes.   You have abdominal pain.  You have a fever.   You become sweaty or weak.   You are passing large blood clots from the vagina.   You start to feel nauseous and vomit. MAKE SURE YOU:   Understand these instructions.  Will watch your condition.  Will get help right away if you are not doing well or get worse.   This information is not intended to replace advice given to you by your health care provider. Make sure you discuss any questions you have with your health care provider.   Document Released: 04/12/2005 Document Revised: 04/17/2013 Document Reviewed: 11/09/2012 Elsevier Interactive  Patient Education 2016 [employer]. Iron-Rich Diet Iron is a mineral that helps your body to produce hemoglobin. Hemoglobin is a protein in your red blood cells that carries oxygen to your body's tissues. Eating too little iron may cause you to feel weak and tired, and it can increase your risk for infection. Eating enough iron is necessary for your body's metabolism, muscle function, and nervous system. Iron is naturally found in many foods. It can also be added to foods or fortified in foods. There are two types of dietary iron:  Heme iron. Heme iron is absorbed by the body more easily than nonheme iron. Heme iron is found in meat, poultry, and fish.  Nonheme iron. Nonheme iron is found in dietary supplements, iron-fortified grains, beans, and vegetables. You may need to follow an iron-rich diet if:  You have been diagnosed with iron deficiency or iron-deficiency anemia.  You have a condition that prevents you from absorbing dietary iron, such as:  Infection in your intestines.  Celiac disease. This involves long-lasting (chronic) inflammation of your intestines.  You do not eat enough iron.  You eat a diet that is high in foods that impair iron absorption.  You have lost a lot of blood.  You have heavy bleeding during your menstrual cycle.  You are pregnant. WHAT IS MY PLAN? Your health care provider may help you to determine how much iron you need per day based on your condition. Generally, when a person consumes sufficient amounts of iron in the diet, the following iron needs are met:  Men.  14-18  years old: 11 mg per day.  82-36 years old: 8 mg per day.  Women.   8-78 years old: 15 mg per day.  52-38 years old: 18 mg per day.  Over 58 years old: 8 mg per day.  Pregnant women: 27 mg per day.  Breastfeeding women: 9 mg per day. WHAT DO I NEED TO KNOW ABOUT AN IRON-RICH DIET?  Eat fresh fruits and vegetables that are high in vitamin C along with foods that are  high in iron. This will help increase the amount of iron that your body absorbs from food, especially with foods containing nonheme iron. Foods that are high in vitamin C include oranges, peppers, tomatoes, and mango.  Take iron supplements only as directed by your health care provider. Overdose of iron can be life-threatening. If you were prescribed iron supplements, take them with orange juice or a vitamin C supplement.  Cook foods in pots and pans that are made from iron.   Eat nonheme iron-containing foods alongside foods that are high in heme iron. This helps to improve your iron absorption.   Certain foods and drinks contain compounds that impair iron absorption. Avoid eating these foods in the same meal as iron-rich foods or with iron supplements. These include:  Coffee, black tea, and red wine.  Milk, dairy products, and foods that are high in calcium.  Beans, soybeans, and peas.  Whole grains.  When eating foods that contain both nonheme iron and compounds that impair iron absorption, follow these tips to absorb iron better.   Soak beans overnight before cooking.  Soak whole grains overnight and drain them before using.  Ferment flours before baking, such as using yeast in bread dough. WHAT FOODS CAN I EAT? Grains Iron-fortified breakfast cereal. Iron-fortified whole-wheat bread. Enriched rice. Sprouted grains. Vegetables Spinach. Potatoes with skin. Green peas. Broccoli. Red and green bell peppers. Fermented vegetables. Fruits Prunes. Raisins. Oranges. Strawberries. Mango. Grapefruit. Meats and Other Protein Sources Beef liver. Oysters. Beef. Shrimp. [place]. Chicken. [place]. Sardines. Chickpeas. Nuts. Tofu. Beverages Tomato juice. Fresh orange juice. Prune juice. Hibiscus tea. Fortified instant breakfast shakes. Condiments Tahini. Fermented soy sauce. Sweets and Desserts Black-strap molasses.  Other Wheat germ. The items listed above may not be a complete  list of recommended foods or beverages. Contact your dietitian for more options. WHAT FOODS ARE NOT RECOMMENDED? Grains Whole grains. Bran cereal. Bran flour. Oats. Vegetables Artichokes. Brussels sprouts. Kale. Fruits Blueberries. Raspberries. Strawberries. Figs. Meats and Other Protein Sources Soybeans. Products made from soy protein. Dairy Milk. Cream. Cheese. Yogurt. Cottage cheese. Beverages Coffee. Black tea. Red wine. Sweets and Desserts Cocoa. Chocolate. Ice cream. Other Basil. Oregano. Parsley. The items listed above may not be a complete list of foods and beverages to avoid. Contact your dietitian for more information.   This information is not intended to replace advice given to you by your health care provider. Make sure you discuss any questions you have with your health care provider.   Document Released: 11/24/2004 Document Revised: 05/03/2014 Document Reviewed: 11/07/2013 Elsevier Interactive Patient Education [employer].

## 2015-07-04 NOTE — MAU Note (Signed)
Bleeding heavily.  Has nexplanon, placed 05/23/15.  Started spotting the following, has never stopped- has gotten heavier, passing large clots. Changing more than once an hour.  Hx of anemia- feeling tired and weak

## 2015-07-07 LAB — GC/CHLAMYDIA PROBE AMP (~~LOC~~) NOT AT ARMC
CHLAMYDIA, DNA PROBE: NEGATIVE
NEISSERIA GONORRHEA: NEGATIVE

## 2015-11-05 ENCOUNTER — Ambulatory Visit (INDEPENDENT_AMBULATORY_CARE_PROVIDER_SITE_OTHER): Payer: Medicaid Other | Admitting: Family Medicine

## 2015-11-05 VITALS — BP 129/76 | HR 79 | Temp 98.1°F | Wt 251.8 lb

## 2015-11-05 DIAGNOSIS — R760 Raised antibody titer: Secondary | ICD-10-CM | POA: Diagnosis not present

## 2015-11-05 DIAGNOSIS — M79642 Pain in left hand: Secondary | ICD-10-CM

## 2015-11-05 DIAGNOSIS — R21 Rash and other nonspecific skin eruption: Secondary | ICD-10-CM | POA: Diagnosis not present

## 2015-11-05 MED ORDER — NAPROXEN 500 MG PO TABS: 500.0000 mg | ORAL_TABLET | Freq: Two times a day (BID) | ORAL | Status: DC

## 2015-11-05 NOTE — Progress Notes (Signed)
Subjective: CC: L hand pain [id number] Latoya Guzman is a 40 y.o. female presenting to clinic today for same day appointment. PCP: Carlyle Dolly, MD Concerns today include:  1. L hand pain Patient notes that she woke up 3 days ago with a swollen left hand that was painful upon wakening.  She notes that it was so swollen she couldn't pick up her phone.  She note h/o swelling of the feet but never the hand.  No preceding injury, bug bites.  She notes that she tried to massage it to make it feel better.  Pain is on the inside of the palm and dorsal aspect of the hand.  She notes that she couldn't move her middle finger the other day.  Pain 10/10 at that time, today a 6/10.  She is Left hand dominant.  She notes tingling in elbow.  Occ numb hand.  She has not regained her strength in that hand yet.  She has not taken any medications or applied any ice to area.  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  She notes that events like that happen after about 10-15 minutes.  That has been going on since the beginning of this year.  She notes she was diagnosed with Lupus antibody.  She has seen a rheumatologist in the past.  She was told nothing else needed to be done/ she didn't need meds unless she was pregnant.  Last visit with rheumatology was early this year.  No history of blood clot.  Additionally, she notes irregular menstrual bleeding since Nexplanon placed.  She has an appt to discuss removal of device.  Social History Reviewed: non smoker. FamHx and MedHx reviewed.  Please see EMR.  ROS: Per HPI  Objective: Office vital signs reviewed. BP 129/76 mmHg  Pulse 79  Temp(Src) 98.1 F (36.7 C) (Oral)  Wt 251 lb 12.8 oz (114.216 kg)  SpO2 98%  Physical Examination:  General: Awake, alert, well nourished, No acute distress HEENT: Normal, sclera white Extremities: warm, well perfused, No edema, cyanosis or clubbing; +2 radial pulses  bilaterally MSK: Normal gait and station, 4/5 hand grip bilaterally.  Left hand with full painless AROM, no erythema, no increased warmth, no swelling appreciated, no ecchymosis or injury noted.  +TTP to palmar aspect of entire proximal middle finger.  No palpable bony abnormalities.  Patient notes that there is numbness and tingling in this finger as well. Neuro: light touch sensation decreased along middle finger Psych: mood stable, speech somewhat pressured.  Assessment/ Plan: 40 y.o. female   1. Left hand pain.  Seems MSK in nature but could also be related to an autoimmune disorder; however, unilateral presentation makes me think that this is less likely.  Doubt carpal tunnel given presentation and exam.  Per patient has lupus Ab.  ANA neg.  Not currently requiring treatment by rheumatology.  No evidence of septic joint.  Exam remarkable for painful, nonswollen, nonerythematous middle finger.  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  Would consider further workup by her PCP for autoimmune disease/ referral back to rheumatology for this. - Will defer imaging at this time, as I do not suspect fracture. - Could consider ultrasound at [facility] - naproxen (NAPROSYN) 500 MG tablet; Take 1 tablet (500 mg total) by mouth 2 (two) times daily with a meal.  Dispense: 30 tablet; Refill: 0 - Ambulatory referral to Sports  Medicine - RICE, and NSAID as above - Return precautions reviewed - Follow up with PCP as needed   Janora Norlander, DO PGY-3, [facility]

## 2015-11-05 NOTE — Patient Instructions (Signed)
I have sent in an anti-inflammatory.  Do NOT take any Motrin/ibuprofen/advil/aleve/naproxen while taking this medication.  I recommend that you use ice for the hand for the swelling. I am referring you to Sports Medicine for further evaluation.  They may do an ultrasound of your hand to further evaluate.  If symptoms worsen, please seek immediate medical attention.  Hand Contusion  A hand contusion is a deep bruise to the hand. Contusions happen when an injury causes bleeding under the skin. Signs of bruising include pain, puffiness (swelling), and discolored skin. The contusion may turn blue, purple, or yellow. HOME CARE  Put ice on the injured area.  Put ice in a plastic bag.  Place a towel between your skin and the bag.  Leave the ice on for 15-20 minutes, 03-04 times a day.  Only take medicines as told by your doctor.  Use an elastic wrap only as told. You may remove the wrap for sleeping, showering, and bathing. Take the wrap off if you lose feeling (have numbness) in your fingers, or they turn blue or cold. Put the wrap on more loosely.  Keep the hand raised (elevated) with pillows.  Avoid using your hand too much if it is painful. GET HELP RIGHT AWAY IF:   You have more redness, puffiness, or pain in your hand.  Your puffiness or pain does not get better with medicine.  You lose feeling in your hand, or you cannot move your fingers.  Your hand turns cold or blue.  You have pain when you move your fingers.  Your hand feels warm.  Your contusion does not get better in 2 days. MAKE SURE YOU:   Understand these instructions.  Will watch this condition.  Will get help right away if you are not doing well or you get worse.   This information is not intended to replace advice given to you by your health care provider. Make sure you discuss any questions you have with your health care provider.   Document Released: 09/29/2007 Document Revised: 05/03/2014 Document  Reviewed: 10/04/2011 Elsevier Interactive Patient Education [employer].

## 2015-11-11 ENCOUNTER — Encounter (HOSPITAL_COMMUNITY): Payer: Self-pay | Admitting: Vascular Surgery

## 2015-11-11 ENCOUNTER — Ambulatory Visit: Payer: Medicaid Other | Admitting: Family Medicine

## 2015-11-11 ENCOUNTER — Emergency Department (HOSPITAL_COMMUNITY)
Admission: EM | Admit: 2015-11-11 | Discharge: 2015-11-12 | Disposition: A | Discharge status: Home / Self Care | Payer: Medicaid Other | Attending: Emergency Medicine | Admitting: Emergency Medicine

## 2015-11-11 DIAGNOSIS — M79605 Pain in left leg: Secondary | ICD-10-CM | POA: Insufficient documentation

## 2015-11-11 DIAGNOSIS — M7989 Other specified soft tissue disorders: Secondary | ICD-10-CM | POA: Diagnosis present

## 2015-11-11 DIAGNOSIS — M79604 Pain in right leg: Secondary | ICD-10-CM | POA: Diagnosis not present

## 2015-11-11 DIAGNOSIS — Z79899 Other long term (current) drug therapy: Secondary | ICD-10-CM | POA: Insufficient documentation

## 2015-11-11 NOTE — ED Notes (Signed)
Pt reports to the ED for eval of BLE swelling, erythema, increased warmth, and pain. Symptoms started yesterday. She has hx of the same and was dx with purpura. Was seen by rheumatologist. She is on medication but she is not sure what the name of it is. She did stand on her feet all day at work yesterday. Pt A&Ox4, resp e/u, and skin warm and dry.

## 2015-11-12 MED ORDER — HYDROCODONE-ACETAMINOPHEN 5-325 MG PO TABS
1.0000 | ORAL_TABLET | Freq: Once | ORAL | Status: AC
  Administered 2015-11-12: 1 via ORAL
  Filled 2015-11-12: qty 1

## 2015-11-12 MED ORDER — HYDROCODONE-ACETAMINOPHEN 5-325 MG PO TABS: 1.0000 | ORAL_TABLET | ORAL | Status: DC | PRN

## 2015-11-12 NOTE — Discharge Instructions (Signed)
FOLLOW UP WITH YOUR DOCTOR FOR FURTHER OUTPATIENT EVALUATION OF RECURRENT LEG SWELLING AND PAIN.

## 2015-11-12 NOTE — ED Provider Notes (Signed)
CSN: [id number]     Arrival date & time 11/11/15  2055 History   First MD Initiated Contact with Patient 11/12/15 0003     Chief Complaint  Patient presents with  . Leg Swelling     (Consider location/radiation/quality/duration/timing/severity/associated sxs/prior Treatment) HPI Comments: Patient presents with complaint of bilateral lower extremity pain and redness that started yesterday. She has had similar symptoms in the past and recognizes them only as "purpura". She has had rheumatology follow up without further diagnosis but reports her evaluation is ongoing. The last episode of similar symptoms was October of last year but she reports painful swelling of upper extremities with hand weakness in the interim. She had a neurologic evaluation at that time, including MRI brain, that was negative. No fever. History of negative LE doppler with previous symptoms.   The history is provided by the patient. No language interpreter was used.    Past Medical History  Diagnosis Date  . Blood transfusion 1998; 2015    Post C/S; related to anemia  . Depression     Was on Lexapro Stopped when pregnant  . Preterm labor     PTL last two pregnancies  . Fibroid 03/2010    Noted on [place] in Dec.  . 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 Tricuspid valve regurgitation 02/27/2010  . Lupus (HCC) Lupus Antigen     associated with pregnancy   . Sickle cell trait ([place])   . Anemia 12/19/2012  . Antiphospholipid antibody positive   . Chronic bronchitis ([place])   . Headache     "weekly" (01/01/2015)  . Anxiety   . Pica     toilet tissue   Past Surgical History  Procedure Laterality Date  . Cesarean section  1998, 2009  . Tubal ligation  11/14/2010  . Cesarean section  11/14/2010    Procedure: CESAREAN SECTION;  Surgeon: Juliene Pina C. Hulan Fray, MD;  Location: [facility];  Service: Gynecology;  Laterality: N/A;  Repeat cesarean section with delivery of baby boy  at 36. Apgars 9/9. Bilateral tubal ligation with filshie clips.   . Dilation and curettage of uterus  2005, 2006    1st for twin loss, 2nd for retained placenta  . Esophagogastroduodenoscopy N/A 01/02/2015    Procedure: ESOPHAGOGASTRODUODENOSCOPY (EGD);  Surgeon: Manus Gunning, MD;  Location: [facility];  Service: Gastroenterology;  Laterality: N/A;   Family History  Problem Relation Age of Onset  . Hypertension Mother   . Lupus Sister   . Diabetes Maternal Uncle   . Cancer Maternal Grandmother   . Colon cancer Neg Hx    Social History  Substance Use Topics  . Smoking status: Never Smoker   . Smokeless tobacco: Never Used  . Alcohol Use: Yes     Comment: 01/01/2015 "might have a couple drinks/year"   OB History    Gravida Para Term Preterm AB TAB SAB Ectopic Multiple Living   '10 8 6 2 2  2  ' 0 7     Review of Systems  Constitutional: Negative for fever and chills.  Respiratory: Negative.  Negative for shortness of breath.   Cardiovascular: Negative.  Negative for chest pain.  Musculoskeletal:       See HPI.  Skin: Positive for color change.  Neurological: Negative.  Negative for weakness and numbness.      Allergies  Review of patient's allergies indicates no known allergies.  Home Medications   Prior to  Admission medications   Medication Sig Start Date End Date Taking? Authorizing Provider  acetaminophen (TYLENOL) 500 MG tablet Take 1 tablet (500 mg total) by mouth every 6 (six) hours as needed. Patient not taking: Reported on 03/07/2015 02/22/15   Marella Chimes, PA-C  ferrous sulfate (FERROUSUL) 325 (65 FE) MG tablet Take 1 tablet (325 mg total) by mouth 2 (two) times daily after a meal. 07/10/13   Rosemarie Ax, MD  fexofenadine (ALLEGRA) 30 MG tablet Take 1 tablet (30 mg total) by mouth 2 (two) times daily. Patient not taking: Reported on 03/07/2015 02/28/15   Veatrice Bourbon, MD  medroxyPROGESTERone (PROVERA) 5 MG tablet Take 1 tablet (5 mg total)  by mouth daily. 07/04/15   Jorje Guild, NP  Multiple Vitamins-Minerals (HAIR SKIN AND NAILS FORMULA) TABS Take 1 tablet by mouth daily.    Historical Provider, MD  Na Sulfate-K Sulfate-Mg Sulf SOLN Take 1 kit by mouth once. suprep as directed. No substitutions. Patient not taking: Reported on 02/21/2015 01/17/15   Manus Gunning, MD  naproxen (NAPROSYN) 500 MG tablet Take 1 tablet (500 mg total) by mouth 2 (two) times daily with a meal. 11/05/15   Janora Norlander, DO  polyethylene glycol (MIRALAX / GLYCOLAX) packet Take 17 g by mouth daily. Patient not taking: Reported on 02/17/2015 01/02/15   Sela Hua, MD  triamcinolone ointment (KENALOG) 0.5 % Apply 1 application topically 2 (two) times daily. Patient not taking: Reported on 02/21/2015 02/18/15   Ashly M Gottschalk, DO   BP 126/78 mmHg  Pulse 65  Temp(Src) 98.1 F (36.7 C) (Oral)  Resp 18  Ht '5\' 11"'  (1.803 m)  Wt 118.559 kg  BMI 36.47 kg/m2  SpO2 100% Physical Exam  Constitutional: She is oriented to person, place, and time. She appears well-developed and well-nourished.  Neck: Normal range of motion.  Pulmonary/Chest: Effort normal.  Musculoskeletal:  Bilateral lower 1/2 of LE's are red with mild swelling circumferentially. No ulcerations, pustules, blistering or drainage. No upper calf tenderness. Distal pulses are 2+ bilaterally.  Neurological: She is alert and oriented to person, place, and time.  Skin: Skin is warm and dry.    ED Course  Procedures (including critical care time) Labs Review Labs Reviewed - No data to display  Imaging Review No results found. I have personally reviewed and evaluated these images and lab results as part of my medical decision-making.   EKG Interpretation None      MDM   Final diagnoses:  None    1. Bilateral LE pain  Patient presents with painful lower legs with redness that are recurrent symptoms, last episode August of 2016. At that time she reports diagnosis of  "purpura". Chart reviewed. Normal blood tests at that time, specifically, platelets and INR. Doppler studies also negative at that time. She is undergoing rheumatologic evaluation now.   Discussed with Dr. Venora Maples. No further work up needed tonight. Will provide pain relief and encourage continuation of outpatient evaluation.    Charlann Lange, PA-C 11/12/15 [place], MD 11/12/15 [phone]

## 2015-11-17 ENCOUNTER — Ambulatory Visit (INDEPENDENT_AMBULATORY_CARE_PROVIDER_SITE_OTHER): Payer: Medicaid Other | Admitting: Sports Medicine

## 2015-11-17 ENCOUNTER — Encounter: Payer: Self-pay | Admitting: Sports Medicine

## 2015-11-17 VITALS — BP 127/73 | HR 67 | Ht 71.0 in | Wt 250.0 lb

## 2015-11-17 DIAGNOSIS — M79642 Pain in left hand: Secondary | ICD-10-CM | POA: Diagnosis present

## 2015-11-17 MED ORDER — GABAPENTIN 300 MG PO CAPS: 300.0000 mg | ORAL_CAPSULE | Freq: Every day | ORAL | 0 refills | Status: DC

## 2015-11-17 NOTE — Progress Notes (Signed)
   Subjective:    Patient ID: Latoya Guzman, female    DOB: 04/28/1975, 40 y.o.   MRN: [id number]  HPI chief complaint: Left hand pain  40 year old female comes in today complaining of sudden onset left hand pain and swelling. Patient states that she awoke one morning with significant pain and swelling in the palm of her left hand. It seems to be concentrated primarily around the middle finger. She was seen by her PCP and placed on naproxen sodium which has been helpful. She denies any prior occurrences but since then she has also had swelling in both of her legs. She has been diagnosed previously with lupus. She has not seen her rheumatologist recently and she is not on any sort of rheumatologic medication. She also states that she gets occasional numbness and tingling down the left arm. Also some spasm and some twitching. Some associated weakness as well. Today her hand pain and swelling are much better.  Medical history reviewed Medications reviewed Allergies reviewed    Review of Systems    as above Objective:   Physical Exam  Well-developed, well-nourished. No acute distress  Left hand: Full range of motion. No soft tissue swelling. No trigger finger. No atrophy. Neurological exam: Strength is 5/5 in both upper extremities. Reflexes are trace but equal at the biceps, triceps, and brachial radialis tendons bilaterally. Sensation is intact to light touch. Good pulses.      Assessment & Plan:   Improved left hand pain and swelling of unknown etiology  It is possible that her symptoms are neuropathic in nature. I'm going to try her on 300 mg of gabapentin at night and reevaluate her in 4 weeks. She will resume her naproxen sodium if she notices any new swelling in her hand. If she gets a positive response to the gabapentin, then we may want to consider referral to neurology. In the meantime, I recommended that she return to her rheumatologist just to make sure that her lupus has not  worsened.

## 2015-12-03 ENCOUNTER — Ambulatory Visit: Payer: Medicaid Other | Admitting: Obstetrics & Gynecology

## 2015-12-05 ENCOUNTER — Encounter: Payer: Self-pay | Admitting: Family Medicine

## 2015-12-05 ENCOUNTER — Ambulatory Visit (INDEPENDENT_AMBULATORY_CARE_PROVIDER_SITE_OTHER): Payer: Medicaid Other | Admitting: Family Medicine

## 2015-12-05 VITALS — BP 116/72 | HR 67 | Temp 98.3°F | Ht 71.0 in | Wt 251.0 lb

## 2015-12-05 DIAGNOSIS — R760 Raised antibody titer: Secondary | ICD-10-CM

## 2015-12-05 DIAGNOSIS — R21 Rash and other nonspecific skin eruption: Secondary | ICD-10-CM | POA: Diagnosis not present

## 2015-12-05 DIAGNOSIS — F32A Depression, unspecified: Secondary | ICD-10-CM

## 2015-12-05 DIAGNOSIS — F329 Major depressive disorder, single episode, unspecified: Secondary | ICD-10-CM

## 2015-12-05 DIAGNOSIS — N939 Abnormal uterine and vaginal bleeding, unspecified: Secondary | ICD-10-CM

## 2015-12-05 NOTE — Patient Instructions (Signed)
Thank you for coming in today, it was so nice to see you. Today we talked about:   Birth control: Please stop taking birth control pills. We will also need to have your Nexplanon taken out.   Joint Pain: I have placed a referral to rheumatology.  Mood:  [facility] Patients Your primary care provider may refer you to a [facility] Consultant for a 15-30 minute visit. [facility] will focus on a particular problem.  After talking to you, the [facility] will help you make any changes you want to make centered around your health. [facility] can help you with: .             Difficult life problems .             Stress, depression or anxiety .             Coping with medical problems .             Reflect on harmful habits (alcohol, tobacco and drugs),  .             Learning relaxation skills  .             Sleep difficulties  .             Mental health concerns  Call [phone] to schedule an appointment  PLEASE SCHEDULE AN APPOINTMENT FOR PATIENT TO [facility]   Please follow up as needed. You can schedule this appointment at the front desk before you leave or call the clinic.  If you have any questions or concerns, please do not hesitate to call the office at [phone]. You can also message me directly via MyChart.   Sincerely,  Smitty Cords, MD   Contraception Choices Contraception (birth control) is the use of any methods or devices to prevent pregnancy. Below are some methods to help avoid pregnancy. HORMONAL METHODS   Contraceptive implant. This is a thin, plastic tube containing progesterone hormone. It does not contain estrogen hormone. Your health care provider inserts the tube in the inner part of the upper arm. The tube can remain in place for up to 3 years. After 3 years, the implant must be removed. The implant prevents the ovaries from releasing an egg (ovulation), thickens the cervical  mucus to prevent sperm from entering the uterus, and thins the lining of the inside of the uterus.  Progesterone-only injections. These injections are given every 3 months by your health care provider to prevent pregnancy. This synthetic progesterone hormone stops the ovaries from releasing eggs. It also thickens cervical mucus and changes the uterine lining. This makes it harder for sperm to survive in the uterus.  Birth control pills. These pills contain estrogen and progesterone hormone. They work by preventing the ovaries from releasing eggs (ovulation). They also cause the cervical mucus to thicken, preventing the sperm from entering the uterus. Birth control pills are prescribed by a health care provider.Birth control pills can also be used to treat heavy periods.  Minipill. This type of birth control pill contains only the progesterone hormone. They are taken every day of each month and must be prescribed by your health care provider.  Birth control patch. The patch contains hormones similar to those in birth control pills. It must be changed once a week and is prescribed by a health care provider.  Vaginal ring. The ring contains hormones similar to those in birth  control pills. It is left in the vagina for 3 weeks, removed for 1 week, and then a new one is put back in place. The patient must be comfortable inserting and removing the ring from the vagina.A health care provider's prescription is necessary.  Emergency contraception. Emergency contraceptives prevent pregnancy after unprotected sexual intercourse. This pill can be taken right after sex or up to 5 days after unprotected sex. It is most effective the sooner you take the pills after having sexual intercourse. Most emergency contraceptive pills are available without a prescription. Check with your pharmacist. Do not use emergency contraception as your only form of birth control. BARRIER METHODS   Female condom. This is a thin sheath  (latex or rubber) that is worn over the penis during sexual intercourse. It can be used with spermicide to increase effectiveness.  Female condom. This is a soft, loose-fitting sheath that is put into the vagina before sexual intercourse.  Diaphragm. This is a soft, latex, dome-shaped barrier that must be fitted by a health care provider. It is inserted into the vagina, along with a spermicidal jelly. It is inserted before intercourse. The diaphragm should be left in the vagina for 6 to 8 hours after intercourse.  Cervical cap. This is a round, soft, latex or plastic cup that fits over the cervix and must be fitted by a health care provider. The cap can be left in place for up to 48 hours after intercourse.  Sponge. This is a soft, circular piece of polyurethane foam. The sponge has spermicide in it. It is inserted into the vagina after wetting it and before sexual intercourse.  Spermicides. These are chemicals that kill or block sperm from entering the cervix and uterus. They come in the form of creams, jellies, suppositories, foam, or tablets. They do not require a prescription. They are inserted into the vagina with an applicator before having sexual intercourse. The process must be repeated every time you have sexual intercourse. INTRAUTERINE CONTRACEPTION  Intrauterine device (IUD). This is a T-shaped device that is put in a woman's uterus during a menstrual period to prevent pregnancy. There are 2 types:  Copper IUD. This type of IUD is wrapped in copper wire and is placed inside the uterus. Copper makes the uterus and fallopian tubes produce a fluid that kills sperm. It can stay in place for 10 years.  Hormone IUD. This type of IUD contains the hormone progestin (synthetic progesterone). The hormone thickens the cervical mucus and prevents sperm from entering the uterus, and it also thins the uterine lining to prevent implantation of a fertilized egg. The hormone can weaken or kill the sperm  that get into the uterus. It can stay in place for 3-5 years, depending on which type of IUD is used. PERMANENT METHODS OF CONTRACEPTION  Female tubal ligation. This is when the woman's fallopian tubes are surgically sealed, tied, or blocked to prevent the egg from traveling to the uterus.  Hysteroscopic sterilization. This involves placing a small coil or insert into each fallopian tube. Your doctor uses a technique called hysteroscopy to do the procedure. The device causes scar tissue to form. This results in permanent blockage of the fallopian tubes, so the sperm cannot fertilize the egg. It takes about 3 months after the procedure for the tubes to become blocked. You must use another form of birth control for these 3 months.  Female sterilization. This is when the female has the tubes that carry sperm tied off (vasectomy).This blocks sperm  from entering the vagina during sexual intercourse. After the procedure, the man can still ejaculate fluid (semen). NATURAL PLANNING METHODS  Natural family planning. This is not having sexual intercourse or using a barrier method (condom, diaphragm, cervical cap) on days the woman could become pregnant.  Calendar method. This is keeping track of the length of each menstrual cycle and identifying when you are fertile.  Ovulation method. This is avoiding sexual intercourse during ovulation.  Symptothermal method. This is avoiding sexual intercourse during ovulation, using a thermometer and ovulation symptoms.  Post-ovulation method. This is timing sexual intercourse after you have ovulated. Regardless of which type or method of contraception you choose, it is important that you use condoms to protect against the transmission of sexually transmitted infections (STIs). Talk with your health care provider about which form of contraception is most appropriate for you.   This information is not intended to replace advice given to you by your health care provider.  Make sure you discuss any questions you have with your health care provider.   Document Released: 04/12/2005 Document Revised: 04/17/2013 Document Reviewed: 10/05/2012 Elsevier Interactive Patient Education [employer].

## 2015-12-05 NOTE — Progress Notes (Signed)
 Subjective:    Patient ID: Latoya Guzman , female   DOB: 12/12/1975 , 40 y.o..   MRN: [id number]  HPI  Latoya Guzman is a [id number] here to discuss her nexplanon.  Contraception/vaginal bleeding:  The current method of family planning is tubal ligation, oral progesterone-only contraceptive and Nexplanon. Patient had bilateral tubal ligation in 2012. In January 2017 she was seen by OB/GYN for heavy menstrual flow. Due to hx of severe anemia required transfusions, a Nexplanon was inserted to try and decrease amount of bleeding.  In March 2017, she went to the [facility] for continued heavy vaginal bleeding with new large blood clots. She was then placed on oral Progesterone.  Since being on Progesterone the bleeding has much improved and she is only spotting intermittently. She does not like the Nexplanon and feels like it has caused her mood to change (see below) Patient is interested in having Nexplanon removed.   Depression:  Presenting Issue: Depression and anger  Report of symptoms: Depression, anger, decreased sex drive, weight gain, increased appetite, headaches  Duration of CURRENT symptoms: Since January when Nexplanon was placed Age of onset of first mood disturbance: 39  Impact on function: Unable to enjoy life like she used to. Strained relationships due to her mood. Decreased intimacy with her partner.   Psychiatric History - Diagnoses: Depression in 2012 per Epic review (patient stated she never had any mood disorder) - Hospitalizations:  None - Pharmacotherapy: Sertraline was started in 2012 per Epic (uncertain when this was stopped) - Outpatient therapy: None  Family history of psychiatric issues: Did not ask  Current and history of substance use: Did not ask  Suicidal ideation: Patient states she has thought about harming herself in the past and has gone as far as stepping in front of a car trying to get hit (twice). She has never been injured. The last time she  tried this was last month. Denies current suicidal ideation.   PHQ-9: Depression screen PHQ 2/9 12/07/2015  Decreased Interest 2  Down, Depressed, Hopeless 1  PHQ - 2 Score 3  Altered sleeping 3  Tired, decreased energy 3  Change in appetite 3  Feeling bad or failure about yourself  2  Trouble concentrating 2  Moving slowly or fidgety/restless 0  Suicidal thoughts 1  PHQ-9 Score 17    Hx of antiphospholipid antibody syndrome Has never had a blood clot to her knowledge States she has Lupus Recently seen by sports medicine for left hand pain and swelling of unknown etiology Has had various joints that have been bothering her at different points in life Admits to intermittent lower extremity erythema  Review of Systems: Per HPI. All other systems reviewed and are negative.   Past Medical History: Patient Active Problem List   Diagnosis Date Noted  . Rash and nonspecific skin eruption 03/03/2015  . Tricuspid regurgitation 01/21/2015  . Pulmonary valve regurgitation 01/21/2015  . Menorrhagia 01/09/2015  . Heart murmur 01/09/2015  . Pica   . Pain in the chest   . Iron deficiency anemia   . Dysphagia, pharyngoesophageal phase   . Depression 03/26/2011  . Antiphospholipid antibody positive 08/25/2007    Medications: reviewed and updated Current Outpatient Prescriptions  Medication Sig Dispense Refill  . naproxen (NAPROSYN) 500 MG tablet Take 1 tablet (500 mg total) by mouth 2 (two) times daily with a meal. (Patient taking differently: Take 500 mg by mouth daily. ) 30 tablet 0  . acetaminophen (TYLENOL) 500 MG tablet   Take 1 tablet (500 mg total) by mouth every 6 (six) hours as needed. (Patient not taking: Reported on 03/07/2015) 30 tablet 0  . ferrous sulfate (FERROUSUL) 325 (65 FE) MG tablet Take 1 tablet (325 mg total) by mouth 2 (two) times daily after a meal. (Patient not taking: Reported on 11/12/2015) 60 tablet 0  . fexofenadine (ALLEGRA) 30 MG tablet Take 1 tablet (30 mg  total) by mouth 2 (two) times daily. (Patient not taking: Reported on 03/07/2015) 30 tablet 5  . gabapentin (NEURONTIN) 300 MG capsule Take 1 capsule (300 mg total) by mouth at bedtime. 60 capsule 0  . HYDROcodone-acetaminophen (NORCO/VICODIN) 5-325 MG tablet Take 1 tablet by mouth every 4 (four) hours as needed. 12 tablet 0  . medroxyPROGESTERone (PROVERA) 5 MG tablet Take 1 tablet (5 mg total) by mouth daily. (Patient not taking: Reported on 11/12/2015) 30 tablet 1  . Na Sulfate-K Sulfate-Mg Sulf SOLN Take 1 kit by mouth once. suprep as directed. No substitutions. (Patient not taking: Reported on 02/21/2015) 354 mL 0  . polyethylene glycol (MIRALAX / GLYCOLAX) packet Take 17 g by mouth daily. (Patient not taking: Reported on 02/17/2015) 14 each 6  . triamcinolone ointment (KENALOG) 0.5 % Apply 1 application topically 2 (two) times daily. (Patient not taking: Reported on 02/21/2015) 30 g 1   No current facility-administered medications for this visit.     Social Hx:  reports that she has never smoked. She has never used smokeless tobacco.    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 Per epic review, she was previously on anticoagulation and diagnosed with vasculitis. Has had intermittent joint pain and erythema of lower extremities. Concerned that patient may have worsening autoimmune process. Pt has seen rheumatology before but states she needs a new rheumatology referral.   Depression Patient mentioned this towards the end of the  visit but it appears her mood has been worse since January 2017. PHQ 9 score of 17 today. Admits to suicidal ideation and attempt last month (stepped in front of car) which is highly concerning. Denies any current suicidal ideation today and promised she would call 911 or go to the ED if it happens again. Strongly encouraged patient to make an appointment with behavioral health consultant (information was provided to patient) and follow up with me. Although Nexplanon may be unlikely source of change in mood, will go ahead and schedule removal per patient preference and see if it helps her. At follow up, will need to gather more information about mood to determine if it would be appropriate to begin a SSRI.  Abnormal uterine bleeding Has been present for ~1 year. Improved with Progesterone pills that were started in March 2017. Patient is positive for antiphospholipid antibody and at risk for a hypercoagulable event. According to the CDC, the risks usually outweigh the advantages for Progesterone and Nexplanon in people with positive antiphospholipid antibody. Will discontinue progesterone pills today. Will discontinue Nexplanon for similar reasons and for patient preference. Will have   patient schedule a separate appointment for this. She is s/p BTL in 2012 so no further contraception will be needed. Reassuringly pelvic and transvaginal ultrasound were normal in Nov 2016 however the continued bleeding will need further evaluation. May benefit from hysterectomy in the future. Will place referral to GYN at this time.   , MD [facility], PGY-2    

## 2015-12-07 DIAGNOSIS — N939 Abnormal uterine and vaginal bleeding, unspecified: Secondary | ICD-10-CM | POA: Insufficient documentation

## 2015-12-07 NOTE — Assessment & Plan Note (Signed)
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 Per epic review, she was previously on anticoagulation and diagnosed with vasculitis. Has had intermittent joint pain and erythema of lower extremities. Concerned that patient may have worsening autoimmune process. Pt has seen rheumatology before but states she needs a new rheumatology referral.

## 2015-12-07 NOTE — Assessment & Plan Note (Addendum)
Patient mentioned this towards the end of the visit but it appears her mood has been worse since January 2017. PHQ 9 score of 17 today. Admits to suicidal ideation and attempt last month (stepped in front of car) which is highly concerning. Denies any current suicidal ideation today and promised she would call 911 or go to the ED if it happens again. Strongly encouraged patient to make an appointment with behavioral health consultant (information was provided to patient) and follow up with me. Although Nexplanon may be unlikely source of change in mood, will go ahead and schedule removal per patient preference and see if it helps her. At follow up, will need to gather more information about mood to determine if it would be appropriate to begin a SSRI.

## 2015-12-07 NOTE — Assessment & Plan Note (Addendum)
Has been present for ~1 year. Improved with Progesterone pills that were started in March 2017. Patient is positive for antiphospholipid antibody and at risk for a hypercoagulable event. According to the CDC, the risks usually outweigh the advantages for Progesterone and Nexplanon in people with positive antiphospholipid antibody. Will discontinue progesterone pills today. Will discontinue Nexplanon for similar reasons and for patient preference. Will have patient schedule a separate appointment for this. She is s/p BTL in 2012 so no further contraception will be needed. Reassuringly pelvic and transvaginal ultrasound were normal in Nov 2016 however the continued bleeding will need further evaluation. May benefit from hysterectomy in the future. Will place referral to GYN at this time.

## 2015-12-11 ENCOUNTER — Ambulatory Visit: Payer: Medicaid Other

## 2015-12-15 ENCOUNTER — Ambulatory Visit (INDEPENDENT_AMBULATORY_CARE_PROVIDER_SITE_OTHER): Payer: Medicaid Other | Admitting: Sports Medicine

## 2015-12-15 ENCOUNTER — Encounter: Payer: Self-pay | Admitting: Sports Medicine

## 2015-12-15 VITALS — BP 123/73 | HR 70 | Ht 71.0 in | Wt 250.0 lb

## 2015-12-15 DIAGNOSIS — R760 Raised antibody titer: Secondary | ICD-10-CM | POA: Diagnosis not present

## 2015-12-15 DIAGNOSIS — R21 Rash and other nonspecific skin eruption: Secondary | ICD-10-CM | POA: Diagnosis not present

## 2015-12-15 DIAGNOSIS — M79642 Pain in left hand: Secondary | ICD-10-CM | POA: Diagnosis not present

## 2015-12-16 NOTE — Progress Notes (Signed)
   Subjective:    Patient ID: Latoya Guzman, female    DOB: April 14, 1976, 40 y.o.   MRN: [id number]  HPI  Patient comes in today for follow-up on left hand pain and swelling. She still gets intermittent swelling but naproxen sodium does help. Pain has improved with gabapentin but she is still complaining of weakness in both hands. Weakness at times will radiate up both of her arms. This weakness has been present for about 4 weeks.    Review of Systems     Objective:   Physical Exam Well-developed, well-nourished. No acute distress  Examination of both hands show full range of motion. No obvious soft tissue swelling. No tenderness to palpation. Good strength. Sensation is intact to light-touch grossly. Good radial and ulnar pulses.       Assessment & Plan:   Left hand pain, possibly neuropathic in nature  Exact diagnosis is still in question. Her improvement with gabapentin makes me think that it is neuropathic. I would like to get an EMG/nerve conduction study but I will need to wait another month or so before doing this. In the meantime, patient will continue on her 300 mg gabapentin daily at bedtime. She will also continue with her naproxen sodium as needed for swelling. Follow-up with me in 4 weeks for reevaluation. I will likely order the EMG/nerve conduction study at that visit.

## 2015-12-19 ENCOUNTER — Other Ambulatory Visit: Payer: Self-pay | Admitting: Family Medicine

## 2015-12-19 DIAGNOSIS — M79642 Pain in left hand: Secondary | ICD-10-CM

## 2015-12-25 ENCOUNTER — Encounter: Payer: Self-pay | Admitting: Family Medicine

## 2015-12-25 ENCOUNTER — Ambulatory Visit (INDEPENDENT_AMBULATORY_CARE_PROVIDER_SITE_OTHER): Payer: Medicaid Other | Admitting: Family Medicine

## 2015-12-25 VITALS — BP 124/67 | HR 74 | Temp 98.1°F | Ht 71.0 in | Wt 252.2 lb

## 2015-12-25 DIAGNOSIS — Z3046 Encounter for surveillance of implantable subdermal contraceptive: Secondary | ICD-10-CM

## 2015-12-25 DIAGNOSIS — E669 Obesity, unspecified: Secondary | ICD-10-CM | POA: Diagnosis not present

## 2015-12-25 DIAGNOSIS — R21 Rash and other nonspecific skin eruption: Secondary | ICD-10-CM | POA: Diagnosis present

## 2015-12-25 DIAGNOSIS — N920 Excessive and frequent menstruation with regular cycle: Secondary | ICD-10-CM | POA: Diagnosis not present

## 2015-12-25 DIAGNOSIS — R760 Raised antibody titer: Secondary | ICD-10-CM | POA: Diagnosis not present

## 2015-12-25 NOTE — Progress Notes (Addendum)
Progestin Implant Removal Note (PATIENT NAME) is here for removal of her etonogestrel rod implant (Implanon/Nexplanon). She would like it removed because of: intolerance and weight gain. Nexplanon was placed for menorrhagia control.  An informed consent was taken prior to removal and is to be scanned into the Electronic Health Record.  Risks of the procedure include: bleeding, infection, difficulty with removal, scarring and nerve damage. There may be bruising at the site of incision and down the arm.  Procedure Note: Time out taken: 9:25am  Team: Andrena Mews, MD, MPH, Latina Craver, RN  (PATIENT NAME) (PATIENT DOB) confirmed (YES)  Procedure: Progestin Implant Removal  Procedure confirmed by patient and team (YES)  Side: (RIGHT)  Position correct for procedure (YES)  Equipment for procedure available (YES)  The patient is place in the supine position. Aseptic conditions are maintained. The rod is located by palpation. The area is cleaned with antiseptic. 5 cc of 1% lidocaine with epinephrine is injected just underneath the end of the implant closest to the elbow. After firmly pressing down on the end of the implant closer to the axilla a 2-3 mm incision is made with a scalpel. The rod is pushed to the incision site and grasped with a mosquito forceps and gently removed. Blunt dissection (WAS) needed. The patient (DID) tolerate the procedure well. The rod was removed in its entirety. The incision was closed with 1 stitches of 3-0 nylon and dressed with a small adhesive bandage closure and a pressure dressing was applied. An alternate plan for contraception was discussed. She had tubal ligation. Advised that progesterone  Is safer than estrogen in patient with antiphospholipid syndrome and might be the best option to control her period. She will discuss Gyn referral with her CP. I referred her to Dr. Brayton Caves for weight loss counseling.  Return in 5-7 days for suture removal. Patient  verbalized understanding.   Andrena Mews, MD, MPH

## 2015-12-25 NOTE — Patient Instructions (Signed)
It was nice seeing you today. Your Nexplanon was removed today. Please use condom regularly from now to prevent pregnancy. See your PCP son to discuss treatment plan for your excessive menstrual bleeding. Contact Dr. Jenne Campus to schedule nutritionist appointment. Follow up as needed.

## 2015-12-26 MED ORDER — IBUPROFEN 200 MG PO TABS
400.0000 mg | ORAL_TABLET | Freq: Once | ORAL | Status: AC
  Administered 2015-12-25: 400 mg via ORAL

## 2015-12-26 NOTE — Addendum Note (Signed)
Addended by: Derl Barrow on: 12/26/2015 11:34 AM   Modules accepted: Orders

## 2016-01-14 ENCOUNTER — Ambulatory Visit (INDEPENDENT_AMBULATORY_CARE_PROVIDER_SITE_OTHER): Payer: Medicaid Other | Admitting: Sports Medicine

## 2016-01-14 ENCOUNTER — Encounter: Payer: Self-pay | Admitting: Sports Medicine

## 2016-01-14 VITALS — BP 122/70 | HR 67 | Ht 71.0 in | Wt 250.0 lb

## 2016-01-14 DIAGNOSIS — M79642 Pain in left hand: Secondary | ICD-10-CM

## 2016-01-14 DIAGNOSIS — R21 Rash and other nonspecific skin eruption: Secondary | ICD-10-CM | POA: Diagnosis not present

## 2016-01-14 DIAGNOSIS — R29898 Other symptoms and signs involving the musculoskeletal system: Secondary | ICD-10-CM | POA: Diagnosis not present

## 2016-01-14 DIAGNOSIS — R760 Raised antibody titer: Secondary | ICD-10-CM | POA: Diagnosis not present

## 2016-01-14 MED ORDER — GABAPENTIN 300 MG PO CAPS: ORAL_CAPSULE | ORAL | 1 refills | Status: DC

## 2016-01-14 MED ORDER — NAPROXEN 500 MG PO TABS: 500.0000 mg | ORAL_TABLET | Freq: Two times a day (BID) | ORAL | 1 refills | Status: DC

## 2016-01-14 NOTE — Progress Notes (Signed)
  Patient comes in today for follow-up on bilateral hand weakness and swelling. She continues to get intermittent swelling primarily in her hands. Naproxen sodium continues to be somewhat helpful. Her main complaint is weakness in both arms. She is stating that it is difficult for her to push herself up from a prone position due to her weakness. She gets some associated numbness and tingling. Minimal pain. She has noticed a loss of grip strength in both hands. She does think the gabapentin has been helpful. She is on 300 mg daily at bedtime. Physical exam was not repeated today. I had discussed at her previous office visit the possibility of proceeding with an EMG/nerve conduction study if her symptoms persisted. I think we need to go ahead with that study. In the meantime, she will increase her gabapentin to 600 mg daily at bedtime. She may continue with her naproxen sodium as needed as well. I will call her with the results of the EMG/nerve conduction study once available and we will delineate further treatment based on those results.  Total time spent with the patient was 15 minutes with greater than 50% of the time spent in face-to-face consultation discussing further workup and treatment.

## 2016-02-25 ENCOUNTER — Encounter: Payer: Self-pay | Admitting: Family Medicine

## 2016-02-25 ENCOUNTER — Ambulatory Visit (INDEPENDENT_AMBULATORY_CARE_PROVIDER_SITE_OTHER): Payer: Medicaid Other | Admitting: Family Medicine

## 2016-02-25 VITALS — BP 126/79 | HR 82 | Temp 98.0°F | Ht 71.0 in | Wt 256.2 lb

## 2016-02-25 DIAGNOSIS — Z0000 Encounter for general adult medical examination without abnormal findings: Secondary | ICD-10-CM

## 2016-02-25 DIAGNOSIS — R760 Raised antibody titer: Secondary | ICD-10-CM | POA: Diagnosis not present

## 2016-02-25 DIAGNOSIS — F329 Major depressive disorder, single episode, unspecified: Secondary | ICD-10-CM

## 2016-02-25 DIAGNOSIS — Z23 Encounter for immunization: Secondary | ICD-10-CM

## 2016-02-25 DIAGNOSIS — R21 Rash and other nonspecific skin eruption: Secondary | ICD-10-CM | POA: Diagnosis not present

## 2016-02-25 DIAGNOSIS — F32A Depression, unspecified: Secondary | ICD-10-CM

## 2016-02-25 DIAGNOSIS — R079 Chest pain, unspecified: Secondary | ICD-10-CM

## 2016-02-25 LAB — LIPID PANEL
CHOLESTEROL: 130 mg/dL (ref 125–200)
HDL: 48 mg/dL (ref >=46)
LDL CALC: 71 mg/dL (ref <130)
TRIGLYCERIDES: 53 mg/dL (ref <150)
Total CHOL/HDL Ratio: 2.7 Ratio (ref <=5.0)
VLDL: 11 mg/dL (ref <30)

## 2016-02-25 NOTE — Assessment & Plan Note (Addendum)
Patient presenting for chest tightness for the past 2-3 weeks. No signs or symptoms concerning for pulmonary embolus. No other respiratory symptoms. Vital signs stable, very likely to be viral or bacterial pneumonia. No worsening or improvement with position changes or upper extremity movement. She does have parasternal tenderness bilaterally, consistent with costochondritis. We'll hold off on EKG today.  Very unlikely to be cardiac in etiology. However, given her antiphospholipid antibody syndrome history and history of pregnancy loss, will get d-dimer to rule out PE.  - Continue to monitor - Recommend follow-up if chest tightness persists and recommend Tylenol PRN and naproxen

## 2016-02-25 NOTE — Progress Notes (Signed)
    Subjective:  Latoya Guzman is a 40 y.o. female who presents to the [facility] today with a chief complaint of chest tightness  HPI:  Chest tightness:  Patient is a history of depression, antiphospholipid antibody syndrome and has previously been seen in the past for "pain in the chest".  She does endorse some shortness of breath and palpitations without cough.  Tightness does not improve or worsen when leaning forward or moving upper extremities. She has not taken any medications to relieve the pain. No history of VTE, however does have history of miscarriages Dr. Juanito Doom saw patient 3 months ago and noted that she has previously been on anticoagulation and pleurisy diagnosed with vasculitis per her OB. No reported birth control tomorrow.  No upper respiratory symptoms.   Depression/anxiety:  Thoughts of harming herself, without clear plan. No thoughts of harming others. Not interested in taking any medication.  Would like a referral to behavioral health clinic.  PHQ-9 previously 17 at last visit and today was 9.    PMH: Abnormal uterine bleeding, depression, iron deficiency anemia ROS: See history of present illness  Objective:  Physical Exam: BP 126/79 (BP Location: Right Arm, Patient Position: Sitting, Cuff Size: Normal)   Pulse 82   Temp 98 F (36.7 C) (Oral)   Ht 5\' 11"  (1.803 m)   Wt 256 lb 3.2 oz (116.2 kg)   LMP 02/22/2016 (Exact Date)   BMI 35.73 kg/m   Gen: 40 year old female in NAD, resting comfortably CV: RRR with no murmurs appreciated Pulm: NWOB, CTAB with no crackles, wheezes, or rhonchi GI: Normal bowel sounds present. Soft, Nontender, Nondistended. MSK: chest wall notable for tenderness to bilateral parasternal tenderness upon palpation. Skin: warm, dry Neuro: grossly normal, moves all extremities Psych: Normal affect and thought content  No results found for this or any previous visit (from the past 72 hour(s)).  PHQ-9: was 9 today and was 17 in August    Assessment/Plan:  Pain in the chest Patient presenting for chest tightness for the past 2-3 weeks. No signs or symptoms concerning for pulmonary embolus. No other respiratory symptoms. Vital signs stable, very likely to be viral or bacterial pneumonia. No worsening or improvement with position changes or upper extremity movement. She does have parasternal tenderness bilaterally, consistent with costochondritis. We'll hold off on EKG today.  Very unlikely to be cardiac in etiology. However, given her antiphospholipid antibody syndrome history and history of pregnancy loss, will get d-dimer to rule out PE.  - Continue to monitor - Recommend follow-up if chest tightness persists and recommend Tylenol PRN and naproxen  Depression And seen by Dr. Juanito Doom in August, and noted some suicidal ideation without clear plan.  At that point her PHQ-9 was 17.  Repeat today was 9.  She also endorses suicidal ideation today without her plan and no thoughts of harming others.  We talked about starting some medication, which she declined being interested in.  Also recommended following up with behavioral health clinic, which she does feel would be helpful.  - Behavioral health clinic follow-up - Follow-up in one month for mood, and costochondritis follow-up

## 2016-02-25 NOTE — Assessment & Plan Note (Signed)
And seen by Dr. Juanito Doom in August, and noted some suicidal ideation without clear plan.  At that point her PHQ-9 was 17.  Repeat today was 9.  She also endorses suicidal ideation today without her plan and no thoughts of harming others.  We talked about starting some medication, which she declined being interested in.  Also recommended following up with behavioral health clinic, which she does feel would be helpful.  - Behavioral health clinic follow-up - Follow-up in one month for mood, and costochondritis follow-up

## 2016-02-25 NOTE — Patient Instructions (Addendum)
Latoya Guzman, he was seen today for tightness in yiour chest and I feel that this is likely due to costochondritis which is inflammation in the cartilage that next year ribs and breast bone.  I think taking ibuprofen and Tylenol as needed will help with the discomfort and this should resolve on its own.  I also ordered some blood work to rule out a more serious condition like a pulmonary embolus, however I think it's very unlikely that you have this.  I also ordered a lipid panel given that one of your values were abnormal in the past and you are overdue for recheck.  Please follow-up with the office with any concerns you have.  We will contact you with the results of your lab work.    [name] L. Rosalyn Gess, [place] Resident PGY-1 02/25/2016 11:00 AM

## 2016-02-26 ENCOUNTER — Encounter: Payer: Self-pay | Admitting: Family Medicine

## 2016-02-26 LAB — D-DIMER, QUANTITATIVE: D-Dimer, Quant: 0.21 mcg/mL FEU (ref <0.50)

## 2016-03-08 ENCOUNTER — Ambulatory Visit (INDEPENDENT_AMBULATORY_CARE_PROVIDER_SITE_OTHER): Payer: Self-pay | Admitting: Neurology

## 2016-03-08 ENCOUNTER — Encounter: Payer: Self-pay | Admitting: Neurology

## 2016-03-08 ENCOUNTER — Ambulatory Visit (INDEPENDENT_AMBULATORY_CARE_PROVIDER_SITE_OTHER): Payer: Medicaid Other | Admitting: Neurology

## 2016-03-08 DIAGNOSIS — R202 Paresthesia of skin: Secondary | ICD-10-CM | POA: Diagnosis not present

## 2016-03-08 DIAGNOSIS — R011 Cardiac murmur, unspecified: Secondary | ICD-10-CM

## 2016-03-08 NOTE — Procedures (Signed)
     HISTORY:  Latoya Guzman is a 40 year old patient with a history of numbness that began in the left arm about 3 months ago, the patient has some numbness up to the shoulder level on that side with an achy sensation in the arm. She does report some neck discomfort. More recently, she has developed total body weakness and achy sensations, similar numbness on the right arm. The patient is being evaluated for a possible neuropathy or a cervical radiculopathy.  NERVE CONDUCTION STUDIES:  Nerve conduction studies were performed on both upper extremities. The distal motor latencies and motor amplitudes for the median and ulnar nerves were within normal limits. The F wave latencies and nerve conduction velocities for these nerves were also normal. The sensory latencies for the median and ulnar nerves were normal.   EMG STUDIES:  EMG study was performed on the left upper extremity:  The first dorsal interosseous muscle reveals 2 to 4 K units with full recruitment. No fibrillations or positive waves were noted. The abductor pollicis brevis muscle reveals 2 to 4 K units with full recruitment. No fibrillations or positive waves were noted. The extensor indicis proprius muscle reveals 1 to 3 K units with full recruitment. No fibrillations or positive waves were noted. The pronator teres muscle reveals 2 to 3 K units with full recruitment. No fibrillations or positive waves were noted. The biceps muscle reveals 1 to 2 K units with full recruitment. No fibrillations or positive waves were noted. The triceps muscle reveals 2 to 4 K units with full recruitment. No fibrillations or positive waves were noted. The anterior deltoid muscle reveals 2 to 3 K units with full recruitment. No fibrillations or positive waves were noted. The cervical paraspinal muscles were tested at 2 levels. No abnormalities of insertional activity were seen at either level tested. There was fair  relaxation.   IMPRESSION:  Nerve conduction studies done on both upper extremities were within normal limits. No evidence of a neuropathy is seen. EMG evaluation of the left upper extremity was unremarkable, without evidence of an overlying cervical radiculopathy.  Jill Alexanders MD 03/08/2016 5:22 PM  [facility] [address] [place], [place] [address]  Phone [phone] Fax [phone]

## 2016-03-08 NOTE — Progress Notes (Signed)
Please refer to EMG and nerve conduction study procedure note area

## 2016-03-12 ENCOUNTER — Telehealth: Payer: Self-pay | Admitting: Sports Medicine

## 2016-03-12 NOTE — Telephone Encounter (Signed)
  I spoke with the patient on the phone today after reviewing her recent EMG/nerve conduction study. It is an unremarkable study. I cannot find an orthopedic reason for her numbness, tingling, and weakness in her arms. Therefore, I've recommended that she follow-up with her primary care physician to discuss a referral to neurology. Patient is in agreement with that plan. Follow-up with me as needed.

## 2016-06-08 ENCOUNTER — Encounter (HOSPITAL_COMMUNITY): Payer: Self-pay | Admitting: *Deleted

## 2016-06-08 ENCOUNTER — Ambulatory Visit (HOSPITAL_COMMUNITY)
Admission: EM | Admit: 2016-06-08 | Discharge: 2016-06-08 | Disposition: A | Discharge status: Nursing Home (Includes ICF) | Payer: Medicaid Other | Attending: Family Medicine | Admitting: Family Medicine

## 2016-06-08 ENCOUNTER — Encounter (HOSPITAL_COMMUNITY): Payer: Self-pay

## 2016-06-08 ENCOUNTER — Emergency Department (HOSPITAL_COMMUNITY)
Admission: EM | Admit: 2016-06-08 | Discharge: 2016-06-09 | Disposition: A | Discharge status: Home / Self Care | Payer: Medicaid Other | Attending: Emergency Medicine | Admitting: Emergency Medicine

## 2016-06-08 DIAGNOSIS — R1032 Left lower quadrant pain: Secondary | ICD-10-CM

## 2016-06-08 DIAGNOSIS — N83202 Unspecified ovarian cyst, left side: Secondary | ICD-10-CM | POA: Insufficient documentation

## 2016-06-08 LAB — URINALYSIS, ROUTINE W REFLEX MICROSCOPIC
Bilirubin Urine: NEGATIVE
GLUCOSE, UA: NEGATIVE mg/dL
HGB URINE DIPSTICK: NEGATIVE
Ketones, ur: NEGATIVE mg/dL
LEUKOCYTES UA: NEGATIVE
Nitrite: NEGATIVE
Protein, ur: NEGATIVE mg/dL
SPECIFIC GRAVITY, URINE: 1.024 (ref 1.005–1.030)
pH: 7 (ref 5.0–8.0)

## 2016-06-08 LAB — POCT URINALYSIS DIP (DEVICE)
Bilirubin Urine: NEGATIVE
GLUCOSE, UA: NEGATIVE mg/dL
Hgb urine dipstick: NEGATIVE
Ketones, ur: NEGATIVE mg/dL
LEUKOCYTES UA: NEGATIVE
NITRITE: NEGATIVE
Protein, ur: NEGATIVE mg/dL
SPECIFIC GRAVITY, URINE: 1.02 (ref 1.005–1.030)
UROBILINOGEN UA: 0.2 mg/dL (ref 0.0–1.0)
pH: 7.5 (ref 5.0–8.0)

## 2016-06-08 LAB — CBC
HCT: 29.2 % — ABNORMAL LOW (ref 36.0–46.0)
HEMOGLOBIN: 9.7 g/dL — AB (ref 12.0–15.0)
MCH: 23.8 pg — AB (ref 26.0–34.0)
MCHC: 33.2 g/dL (ref 30.0–36.0)
MCV: 71.6 fL — ABNORMAL LOW (ref 78.0–100.0)
Platelets: 274 10*3/uL (ref 150–400)
RBC: 4.08 MIL/uL (ref 3.87–5.11)
RDW: 15.4 % (ref 11.5–15.5)
WBC: 5.8 10*3/uL (ref 4.0–10.5)

## 2016-06-08 LAB — COMPREHENSIVE METABOLIC PANEL
ALT: 26 U/L (ref 14–54)
ANION GAP: 8 (ref 5–15)
AST: 29 U/L (ref 15–41)
Albumin: 3.9 g/dL (ref 3.5–5.0)
Alkaline Phosphatase: 74 U/L (ref 38–126)
BUN: 14 mg/dL (ref 6–20)
CHLORIDE: 107 mmol/L (ref 101–111)
CO2: 25 mmol/L (ref 22–32)
Calcium: 9.3 mg/dL (ref 8.9–10.3)
Creatinine, Ser: 0.96 mg/dL (ref 0.44–1.00)
GFR calc non Af Amer: >60 mL/min (ref >60)
Glucose, Bld: 92 mg/dL (ref 65–99)
POTASSIUM: 4.2 mmol/L (ref 3.5–5.1)
SODIUM: 140 mmol/L (ref 135–145)
Total Bilirubin: 0.2 mg/dL — ABNORMAL LOW (ref 0.3–1.2)
Total Protein: 7.1 g/dL (ref 6.5–8.1)

## 2016-06-08 LAB — LIPASE, BLOOD: LIPASE: 27 U/L (ref 11–51)

## 2016-06-08 LAB — POCT PREGNANCY, URINE: PREG TEST UR: NEGATIVE

## 2016-06-08 MED ORDER — MORPHINE SULFATE (PF) 4 MG/ML IV SOLN: 4.0000 mg | Freq: Once | INTRAVENOUS | Status: DC

## 2016-06-08 MED ORDER — KETOROLAC TROMETHAMINE 30 MG/ML IJ SOLN
30.0000 mg | Freq: Once | INTRAMUSCULAR | Status: AC
  Administered 2016-06-08: 30 mg via INTRAVENOUS
  Filled 2016-06-08: qty 1

## 2016-06-08 MED ORDER — SODIUM CHLORIDE 0.9 % IV BOLUS (SEPSIS)
1000.0000 mL | Freq: Once | INTRAVENOUS | Status: AC
  Administered 2016-06-08: 1000 mL via INTRAVENOUS

## 2016-06-08 MED ORDER — IOPAMIDOL (ISOVUE-300) INJECTION 61%
INTRAVENOUS | Status: AC
  Administered 2016-06-09: 100 mL
  Filled 2016-06-08: qty 100

## 2016-06-08 NOTE — Discharge Instructions (Signed)
Go directly to ER for further eval of abdominal pain.

## 2016-06-08 NOTE — ED Provider Notes (Signed)
[facility]    CSN: [id number] Arrival date & time: 06/08/16  1452     History   Chief Complaint Chief Complaint  Patient presents with  . Abdominal Pain  . Polyuria  . Nausea    HPI Latoya Guzman is a 41 y.o. female.   The history is provided by the patient and a friend.  Abdominal Pain  Pain location:  LLQ Pain quality: cramping   Pain radiates to:  L flank Pain severity:  Moderate (level 8/10.) Onset quality:  Gradual Chronicity:  Recurrent Relieved by:  Nothing Worsened by:  Nothing Ineffective treatments:  None tried Associated symptoms: nausea   Associated symptoms: no diarrhea, no dysuria, no fever, no vaginal bleeding, no vaginal discharge and no vomiting     Past Medical History:  Diagnosis Date  . Anemia 12/19/2012  . Antiphospholipid antibody positive   . Anxiety   . Blood transfusion 1998; 2015   Post C/S; related to anemia  . Chronic bronchitis ([place])   . Depression    Was on Lexapro Stopped when pregnant  . Fibroid 03/2010   Noted on [place] in Dec.  . Headache    "weekly" (01/01/2015)  . Lupus Lupus Antigen    associated with pregnancy   . 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 Pica    toilet tissue  . Preterm labor    PTL last two pregnancies  . Sickle cell trait ([place])   . Tricuspid valve regurgitation 02/27/2010    Patient Active Problem List   Diagnosis Date Noted  . Abnormal uterine bleeding 12/07/2015  . Rash and nonspecific skin eruption 03/03/2015  . Tricuspid regurgitation 01/21/2015  . Pulmonary valve regurgitation 01/21/2015  . Menorrhagia 01/09/2015  . Heart murmur 01/09/2015  . Pica   . Pain in the chest   . Iron deficiency anemia   . Dysphagia, pharyngoesophageal phase   . Depression 03/26/2011  . Antiphospholipid antibody positive 08/25/2007    Past Surgical History:  Procedure Laterality Date  . [place], 2009  . CESAREAN SECTION   11/14/2010   Procedure: CESAREAN SECTION;  Surgeon: Juliene Pina C. Hulan Fray, MD;  Location: [facility];  Service: Gynecology;  Laterality: N/A;  Repeat cesarean section with delivery of baby boy at 77. Apgars 9/9. Bilateral tubal ligation with filshie clips.   Marland Kitchen DILATION AND CURETTAGE OF UTERUS  2005, 2006   1st for twin loss, 2nd for retained placenta  . ESOPHAGOGASTRODUODENOSCOPY N/A 01/02/2015   Procedure: ESOPHAGOGASTRODUODENOSCOPY (EGD);  Surgeon: Manus Gunning, MD;  Location: [facility];  Service: Gastroenterology;  Laterality: N/A;  . TUBAL LIGATION  11/14/2010    OB History    Gravida Para Term Preterm AB Living   10 8 6 2 2 7    SAB TAB Ectopic Multiple Live Births   2     0 7       Home Medications    Prior to Admission medications   Medication Sig Start Date End Date Taking? Authorizing Provider  gabapentin (NEURONTIN) 300 MG capsule Take 1 to 2 tablets at bedtime 01/14/16  Yes Timothy R Draper, DO  naproxen (NAPROSYN) 500 MG tablet Take 1 tablet (500 mg total) by mouth 2 (two) times daily with a meal. 01/14/16  Yes Thurman Coyer, DO  HYDROcodone-acetaminophen (NORCO/VICODIN) 5-325 MG tablet Take 1 tablet by mouth every 4 (four) hours as needed. 11/12/15   Charlann Lange, PA-C  Family History Family History  Problem Relation Age of Onset  . Hypertension Mother   . Lupus Sister   . Diabetes Maternal Uncle   . Cancer Maternal Grandmother   . Colon cancer Neg Hx     Social History Social History  Substance Use Topics  . Smoking status: Never Smoker  . Smokeless tobacco: Never Used  . Alcohol use Yes     Comment: 01/01/2015 "might have a couple drinks/year"     Allergies   Patient has no known allergies.   Review of Systems Review of Systems  Constitutional: Negative.  Negative for fever.  HENT: Negative.   Respiratory: Negative.   Gastrointestinal: Positive for abdominal pain and nausea. Negative for blood in stool, diarrhea and vomiting.  Genitourinary:  Positive for flank pain, frequency, menstrual problem and pelvic pain. Negative for dysuria, vaginal bleeding and vaginal discharge.  All other systems reviewed and are negative.    Physical Exam Triage Vital Signs ED Triage Vitals [06/08/16 1536]  Enc Vitals Group     BP      Pulse      Resp      Temp      Temp src      SpO2      Weight      Height      Head Circumference      Peak Flow      Pain Score 8     Pain Loc      Pain Edu?      Excl. in [place]?    No data found.   Updated Vital Signs There were no vitals taken for this visit.  Visual Acuity Right Eye Distance:   Left Eye Distance:   Bilateral Distance:    Right Eye Near:   Left Eye Near:    Bilateral Near:     Physical Exam  Constitutional: She appears well-developed and well-nourished.  Abdominal: Soft. Bowel sounds are normal. She exhibits no mass. There is tenderness in the left lower quadrant. There is no rigidity, no rebound, no guarding and no CVA tenderness.    Skin: Skin is warm and dry.  Nursing note and vitals reviewed.    UC Treatments / Results  Labs (all labs ordered are listed, but only abnormal results are displayed) Labs Reviewed  POCT URINALYSIS DIP (DEVICE)    EKG  EKG Interpretation None       Radiology No results found.  Procedures Procedures (including critical care time)  Medications Ordered in UC Medications - No data to display   Initial Impression / Assessment and Plan / UC Course  I have reviewed the triage vital signs and the nursing notes.  Pertinent labs & imaging results that were available during my care of the patient were reviewed by me and considered in my medical decision making (see chart for details).     Sent for eval of llq pain, u/a neg upreg neg.  Final Clinical Impressions(s) / UC Diagnoses   Final diagnoses:  None    New Prescriptions New Prescriptions   No medications on file     Billy Fischer, MD 06/08/16 1654

## 2016-06-08 NOTE — ED Triage Notes (Signed)
Patient reports left sided lower abdominal pain associated with nausea (no vomiting or diarrhea) and polyuria. No fevers.

## 2016-06-08 NOTE — ED Provider Notes (Signed)
[facility] DEPT Provider Note   CSN: [id number] Arrival date & time: 06/08/16  1846     History   Chief Complaint Chief Complaint  Patient presents with  . Abdominal Pain    HPI Latoya Guzman is a 41 y.o. female.  HPI  41 y.o. female, presents to the Emergency Department today complaining of LLQ pain with onset yesterday. States it was sudden with pain 10/10. Sharp sensation with pullin sensation to posterior aspect. Notes normal BMs, but noted some blood in last stool passage. No pain involved. Notes no diarrhea. No N/V. No fevers. OTC remedies with minimal relief. Saw UC today and told to come to ED for further evaluation. Pt does not history of diverticulosis in past. No other symptoms noted    Past Medical History:  Diagnosis Date  . Anemia 12/19/2012  . Antiphospholipid antibody positive   . Anxiety   . Blood transfusion 1998; 2015   Post C/S; related to anemia  . Chronic bronchitis ([place])   . Depression    Was on Lexapro Stopped when pregnant  . Fibroid 03/2010   Noted on [place] in Dec.  . Headache    "weekly" (01/01/2015)  . Lupus Lupus Antigen    associated with pregnancy   . 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 Pica    toilet tissue  . Preterm labor    PTL last two pregnancies  . Sickle cell trait ([place])   . Tricuspid valve regurgitation 02/27/2010    Patient Active Problem List   Diagnosis Date Noted  . Abnormal uterine bleeding 12/07/2015  . Rash and nonspecific skin eruption 03/03/2015  . Tricuspid regurgitation 01/21/2015  . Pulmonary valve regurgitation 01/21/2015  . Menorrhagia 01/09/2015  . Heart murmur 01/09/2015  . Pica   . Pain in the chest   . Iron deficiency anemia   . Dysphagia, pharyngoesophageal phase   . Depression 03/26/2011  . Antiphospholipid antibody positive 08/25/2007    Past Surgical History:  Procedure Laterality Date  . [place], 2009  .  CESAREAN SECTION  11/14/2010   Procedure: CESAREAN SECTION;  Surgeon: Juliene Pina C. Hulan Fray, MD;  Location: [facility];  Service: Gynecology;  Laterality: N/A;  Repeat cesarean section with delivery of baby boy at 66. Apgars 9/9. Bilateral tubal ligation with filshie clips.   Marland Kitchen DILATION AND CURETTAGE OF UTERUS  2005, 2006   1st for twin loss, 2nd for retained placenta  . ESOPHAGOGASTRODUODENOSCOPY N/A 01/02/2015   Procedure: ESOPHAGOGASTRODUODENOSCOPY (EGD);  Surgeon: Manus Gunning, MD;  Location: [facility];  Service: Gastroenterology;  Laterality: N/A;  . TUBAL LIGATION  11/14/2010    OB History    Gravida Para Term Preterm AB Living   10 8 6 2 2 7    SAB TAB Ectopic Multiple Live Births   2     0 7       Home Medications    Prior to Admission medications   Medication Sig Start Date End Date Taking? Authorizing Provider  gabapentin (NEURONTIN) 300 MG capsule Take 1 to 2 tablets at bedtime 01/14/16   Thurman Coyer, DO  HYDROcodone-acetaminophen (NORCO/VICODIN) 5-325 MG tablet Take 1 tablet by mouth every 4 (four) hours as needed. 11/12/15   Charlann Lange, PA-C  naproxen (NAPROSYN) 500 MG tablet Take 1 tablet (500 mg total) by mouth 2 (two) times daily with a meal. 01/14/16   Thurman Coyer, DO    Family  History Family History  Problem Relation Age of Onset  . Hypertension Mother   . Lupus Sister   . Diabetes Maternal Uncle   . Cancer Maternal Grandmother   . Colon cancer Neg Hx     Social History Social History  Substance Use Topics  . Smoking status: Never Smoker  . Smokeless tobacco: Never Used  . Alcohol use Yes     Comment: 01/01/2015 "might have a couple drinks/year"     Allergies   Patient has no known allergies.   Review of Systems Review of Systems ROS reviewed and all are negative for acute change except as noted in the HPI.  Physical Exam Updated Vital Signs BP 127/86   Pulse 68   Temp 98.1 F (36.7 C) (Oral)   Resp 18   Ht 5\' 11"  (1.803 m)   Wt  108.9 kg   LMP 05/27/2016   SpO2 100%   BMI 33.47 kg/m   Physical Exam  Constitutional: She is oriented to person, place, and time. Vital signs are normal. She appears well-developed and well-nourished.  HENT:  Head: Normocephalic and atraumatic.  Right Ear: Hearing normal.  Left Ear: Hearing normal.  Eyes: Conjunctivae and EOM are normal. Pupils are equal, round, and reactive to light.  Neck: Normal range of motion. Neck supple.  Cardiovascular: Normal rate, regular rhythm, normal heart sounds and intact distal pulses.   Pulmonary/Chest: Effort normal and breath sounds normal. No respiratory distress. She has no wheezes.  Abdominal: Soft. Normal appearance and bowel sounds are normal. There is tenderness in the left lower quadrant. There is no rigidity, no rebound, no guarding, no CVA tenderness, no tenderness at McBurney's point and negative Murphy's sign.  Abdomen soft  Musculoskeletal: Normal range of motion.  Neurological: She is alert and oriented to person, place, and time.  Skin: Skin is warm and dry.  Psychiatric: She has a normal mood and affect. Her speech is normal and behavior is normal. Thought content normal.  Nursing note and vitals reviewed.  ED Treatments / Results  Labs (all labs ordered are listed, but only abnormal results are displayed) Labs Reviewed  COMPREHENSIVE METABOLIC PANEL - Abnormal; Notable for the following:       Result Value   Total Bilirubin 0.2 (*)    All other components within normal limits  CBC - Abnormal; Notable for the following:    Hemoglobin 9.7 (*)    HCT 29.2 (*)    MCV 71.6 (*)    MCH 23.8 (*)    All other components within normal limits  LIPASE, BLOOD  URINALYSIS, ROUTINE W REFLEX MICROSCOPIC  POC URINE PREG, ED   EKG  EKG Interpretation None      Radiology Ct Abdomen Pelvis W Contrast  Result Date: 06/09/2016 CLINICAL DATA:  Sickle-cell trait. Abdominal pain, evaluate for diverticulitis EXAM: CT ABDOMEN AND PELVIS  WITH CONTRAST TECHNIQUE: Multidetector CT imaging of the abdomen and pelvis was performed using the standard protocol following bolus administration of intravenous contrast. CONTRAST:  140mL ISOVUE-300 IOPAMIDOL (ISOVUE-300) INJECTION 61% COMPARISON:  02/25/2015 pelvic ultrasound, right upper quadrant ultrasound 08/12/2012 FINDINGS: Lower chest: No acute abnormality. Hepatobiliary: Contracted gallbladder. No biliary dilatation. Faint ill-defined nonspecific hypodensity in the right hepatic lobe anteriorly may reflect a small hemangioma measuring 13 x 8 mm. Pancreas: Unremarkable. No pancreatic ductal dilatation or surrounding inflammatory changes. Spleen: Normal in size without focal abnormality. Adrenals/Urinary Tract: Adrenal glands are unremarkable. Kidneys are normal, without renal calculi, focal lesion, or hydronephrosis. Bladder is  unremarkable. Stomach/Bowel: Contracted stomach. Normal small bowel rotation. No bowel obstruction or acute inflammation of small intestine. Normal appendix. Moderate colonic stool burden without inflammation. Vascular/Lymphatic: No significant vascular findings are present. No enlarged abdominal or pelvic lymph nodes. Reproductive: Tubal ligation clips bilaterally. Peripherally enhancing cyst or follicle in the left ovary measuring 1.4 cm with associated small amount free fluid in the pelvis. Slightly lobular contour of the left uterine body may reflect a subserosal fibroid estimated at 3.2 cm. Other: Mild rectus diastases at the level of the umbilicus. No abdominal wall hernia. No free air. Musculoskeletal: No acute or significant osseous findings. IMPRESSION: Left ovarian enhancing cyst or follicle possibly representing the corpus luteum with small amount of free fluid in the pelvis. No acute bowel inflammation noted. Subtle hypodensity in the right hepatic lobe with slightly irregular margins measuring 13 x 8 mm may reflect a small hemangioma. This could be further correlated  with ultrasound on a nonemergent basis or followed up with CT without and with IV contrast for better characterization, favor CT. Electronically Signed   By: Ashley Royalty M.D.   On: 06/09/2016 00:43    Procedures Procedures (including critical care time)  Medications Ordered in ED Medications  sodium chloride 0.9 % bolus 1,000 mL (1,000 mLs Intravenous New Bag/Given 06/08/16 2254)  ketorolac (TORADOL) 30 MG/ML injection 30 mg (30 mg Intravenous Given 06/08/16 2333)  iopamidol (ISOVUE-300) 61 % injection (100 mLs  Contrast Given 06/09/16 0008)   Initial Impression / Assessment and Plan / ED Course  I have reviewed the triage vital signs and the nursing notes.  Pertinent labs & imaging results that were available during my care of the patient were reviewed by me and considered in my medical decision making (see chart for details).  Final Clinical Impressions(s) / ED Diagnoses  {I have reviewed and evaluated the relevant laboratory values. {I have reviewed and evaluated the relevant imaging studies.  {I have reviewed the relevant previous healthcare records.  {I obtained HPI from historian.   ED Course:  Assessment: Patient is a Latoya Guzman presents with abdominal pain in LLQ since yesterday. Sharp sensation. Normals BMs, Hx Diverticulosis per pateint. No N/V. No fevers. On exam, nontoxic, nonseptic appearing, in no apparent distress. Patient's pain and other symptoms adequately managed in emergency department.  Fluid bolus given.  Labs, imaging and vitals reviewed.  CT Abdomen/Pelvis shows left ovarian enhancing cyst or follicle with small amount of free fluid. Low indication for torsion based on exam and symptoms. Patient does not meet the SIRS or Sepsis criteria.  On repeat exam patient does not have a surgical abdomen and there are no peritoneal signs.  No indication of appendicitis, bowel obstruction, bowel perforation, cholecystitis, diverticulitis, PID or ectopic pregnancy. Patient discharged home  with symptomatic treatment and given strict instructions for follow-up with their primary care physician. Given Rx antiinflammatories. I have also discussed reasons to return immediately to the ER.  Patient expresses understanding and agrees with plan.  Disposition/Plan:  DC Home Additional Verbal discharge instructions given and discussed with patient.  Pt Instructed to f/u with PCP in the next week for evaluation and treatment of symptoms. Return precautions given Pt acknowledges and agrees with plan  Supervising Physician Carmin Muskrat, MD  Final diagnoses:  Left lower quadrant pain  Cyst of left ovary    New Prescriptions New Prescriptions   No medications on file     Shary Decamp, PA-C 06/09/16 0050    Carmin Muskrat, MD 06/13/16 [phone]

## 2016-06-08 NOTE — ED Triage Notes (Signed)
Pt states she started having L abdominal pain that started yesterday; pt states pain goes into lower back; pt c/o nausea but denies vomiting; Pt states pain at 8/10 on arrival. Pt a&ox 4 and ambulatory;

## 2016-06-09 ENCOUNTER — Telehealth (HOSPITAL_COMMUNITY): Payer: Self-pay | Admitting: Emergency Medicine

## 2016-06-09 ENCOUNTER — Emergency Department (HOSPITAL_COMMUNITY): Payer: Medicaid Other

## 2016-06-09 MED ORDER — IBUPROFEN 600 MG PO TABS: 600.0000 mg | ORAL_TABLET | Freq: Four times a day (QID) | ORAL | 0 refills | Status: DC | PRN

## 2016-06-09 NOTE — ED Notes (Signed)
Patient transported to CT 

## 2016-06-09 NOTE — Telephone Encounter (Signed)
Patient requesting a work note for today, after reviewing medical record from ed-ed did provide a work note.  Unable to print ed note in ucc department.  Will produce from ucc record

## 2016-06-09 NOTE — Telephone Encounter (Signed)
Attempted to print, unable to do so.  Instructed patient to go to ed to get work note that is visible in medical record of ed visit.
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
## 2016-08-06 ENCOUNTER — Ambulatory Visit: Payer: Medicaid Other | Admitting: Internal Medicine

## 2016-08-06 ENCOUNTER — Ambulatory Visit: Payer: Medicaid Other | Admitting: Family Medicine

## 2016-08-09 ENCOUNTER — Ambulatory Visit: Payer: Medicaid Other | Admitting: Family Medicine

## 2016-08-24 ENCOUNTER — Telehealth: Payer: Self-pay

## 2016-08-24 ENCOUNTER — Emergency Department (HOSPITAL_COMMUNITY): Payer: Medicaid Other

## 2016-08-24 ENCOUNTER — Encounter (HOSPITAL_COMMUNITY): Payer: Self-pay | Admitting: Vascular Surgery

## 2016-08-24 ENCOUNTER — Emergency Department (HOSPITAL_COMMUNITY)
Admission: EM | Admit: 2016-08-24 | Discharge: 2016-08-24 | Disposition: A | Discharge status: Home / Self Care | Payer: Medicaid Other | Attending: Emergency Medicine | Admitting: Emergency Medicine

## 2016-08-24 DIAGNOSIS — S0990XA Unspecified injury of head, initial encounter: Secondary | ICD-10-CM | POA: Diagnosis present

## 2016-08-24 DIAGNOSIS — Y929 Unspecified place or not applicable: Secondary | ICD-10-CM | POA: Insufficient documentation

## 2016-08-24 DIAGNOSIS — Y999 Unspecified external cause status: Secondary | ICD-10-CM | POA: Diagnosis not present

## 2016-08-24 DIAGNOSIS — Y939 Activity, unspecified: Secondary | ICD-10-CM | POA: Diagnosis not present
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
## 2016-08-24 NOTE — ED Notes (Signed)
Bedside report given with Janett Billow, pt updated with POC, pt verbalized understanding.,

## 2016-08-24 NOTE — Discharge Instructions (Signed)
Keep yourself hydrated. Follow up in the sports medicine clinic. Return to the ED if you develop new or worsening symptoms.
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
## 2016-08-24 NOTE — ED Provider Notes (Signed)
EMERGENCY DEPARTMENT [place] OCULAR EXAM "Study: Limited Ultrasound of Orbit "  INDICATIONS: Floaters/Flashes Linear probe utilized to obtain images in both long and short axis of the orbit having the patient look left and right if possible.  PERFORMED BY: Myself IMAGES ARCHIVED?: Yes LIMITATIONS: none VIEWS USED: Right orbit and Left orbit INTERPRETATION: No retinal detachment    Okey Regal, PA-C 08/24/16 [id number]    Ezequiel Essex, MD 08/24/16 1850

## 2016-08-25 ENCOUNTER — Ambulatory Visit (INDEPENDENT_AMBULATORY_CARE_PROVIDER_SITE_OTHER): Payer: Medicaid Other | Admitting: Family Medicine

## 2016-08-25 ENCOUNTER — Encounter: Payer: Self-pay | Admitting: Family Medicine

## 2016-08-25 DIAGNOSIS — S060X9A Concussion with loss of consciousness of unspecified duration, initial encounter: Secondary | ICD-10-CM | POA: Insufficient documentation

## 2016-08-25 DIAGNOSIS — S060X0A Concussion without loss of consciousness, initial encounter: Secondary | ICD-10-CM | POA: Diagnosis not present

## 2016-08-25 DIAGNOSIS — S060XAA Concussion with loss of consciousness status unknown, initial encounter: Secondary | ICD-10-CM | POA: Insufficient documentation

## 2016-08-25 NOTE — Patient Instructions (Signed)
Good to see you I do think you have a concussion.  I would like you to limit screen time (including your phone) to 30 minutes daily  Keep you out of work until Monday  You may then start the return to play protocol I am giving you.  In addition to this I recommend......  To help improve COGNITIVE function: Using fish oil/omega 3 that is 1000 mg (or roughly 600 mg EPA/DHA), starting as soon as possible after concussion, take:  3 tabs ONCE DAILY  for the next 10 days   To help reduce HEADACHES: Coenzyme Q10 160mg  ONCE DAILY  Other medicines to help decrease inflammation Turmeric 500mg  twice daily  I want to see you again on Friday Ok to double book)
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
## 2016-08-26 NOTE — Progress Notes (Deleted)
Subjective:     Chief Complaint: Latoya Guzman, DOB: 1975/12/27, is a 41 y.o. female who presents for No chief complaint on file.   Injury date : *** Visit #: ***  History of Present Illness:   Concussion Self-Reported Symptom Score Symptoms rated on a scale 1-6, in last 24 hours  Headache: ***    Nausea: ***  Vomiting: ***  Balance Difficulty: ***   Dizziness: ***  Fatigue: ***  Trouble Falling Asleep: ***   Sleep More Than Usual: ***  Sleep Less Than Usual: ***  Daytime Drowsiness: ***  Photophobia: ***  Phonophobia: ***  Feeling anxious: ***  Confused: ***  Irritability: ***  Sadness: ***  Nervousness: ***  Feeling More Emotional: ***  Numbness or Tingling: ***  Feeling Slowed Down: ***  Feeling Mentally Foggy: ***  Difficulty Concentrating: ***  Difficulty Remembering: ***  Visual Problems: ***  Neck Pain: ***  Tinnitus: ***   Total Symptom Score: *** Previous Symptom Score: ***  Review of Systems: Pertinent items are noted in HPI.  Review of History: Past Medical History: @PMHP @  Past Surgical History:  has a past surgical history that includes Cesarean section (1998, 2009); Tubal ligation (11/14/2010); Cesarean section (11/14/2010); Dilation and curettage of uterus (2005, 2006); and Esophagogastroduodenoscopy (N/A, 01/02/2015). Family History: family history includes Cancer in her maternal grandmother; Diabetes in her maternal uncle; Hypertension in her mother; Lupus in her sister. Social History:  reports that she has never smoked. She has never used smokeless tobacco. She reports that she drinks alcohol. She reports that she does not use drugs. Current Medications: has a current medication list which includes the following prescription(s): gabapentin and ibuprofen. Allergies: has No Known Allergies.  Objective:    Physical Examination There were no vitals filed for this visit. General appearance: alert, appears stated age and cooperative Head:  Normocephalic, without obvious abnormality, atraumatic Eyes: conjunctivae/corneas clear. PERRL, EOM's intact. Fundi benign. Sclera anicteric. Lungs: clear to auscultation bilaterally and percussion Heart: regular rate and rhythm, S1, S2 normal, no murmur, click, rub or gallop Neurologic: CN 2-12 normal.  Sensation to pain, touch, and proprioception normal.  DTRs  normal in upper and lower extremities. No pathologic reflexes. Neg rhomberg, modified rhomberg, pronator drift, tandem gait, finger-to-nose; see post-concussion vestibular and oculomotor testing in chart Psychiatric: Oriented X3, intact recent and remote memory, judgement and insight, normal mood and affect  Concussion testing performed today:  Neurocognitive testing (ImPACT):  Baseline:*** Post #1: ***   Verbal Memory Composite *** (***%) *** (***%)   Visual Memory Composite *** (***%) *** (***%)   Visual Motor Speed Composite *** (***%) *** (***%)   Reaction Time Composite *** (***%) *** (***%)   Cognitive Efficiency Index *** ***     Vestibular Screening:   Pre VOMS  HA Score: *** Pre VOMS  Dizziness Score: ***   Headache  Dizziness  Smooth Pursuits *** ***  H. Saccades *** ***  V. Saccades *** ***  H. VOR *** ***  V. VOR *** ***  Visual Motor Sensitivity *** ***  Accommodation Right: *** cm Left: *** cm *** ***  Convergence: *** cm Divergence: *** cm *** ***   Balance Screen: ***  Additional testing performed today: { :28529}   Assessment:    No diagnosis found.  Latoya Guzman presents with the following concussion subtypes. [] Cognitive [] Cervical [] Vestibular [] Ocular [] Migraine [] Anxiety/Mood   Plan:   Action/Discussion: Reviewed diagnosis, management options, expected outcomes, and the reasons for scheduled and emergent follow-up.  Questions were adequately answered. Patient expressed verbal understanding and agreement with the following plan.      Participation in school/work: Patient is  cleared to return to work/school and activities of daily living without restrictions.  Patient is not cleared to return to work/school until further notice.  Patient may return to work/school on ***, with the following restrictions/supports:  Length of Day:  Please allow patient to use *** class as study hall in a quiet area.  Shortened school/work day: Recommend *** until ***.  Recommend core classes only.  Extra Time:  Take mental rest breaks during the day as needed. Check for return of symptoms when participating in any activities that require a significant amount of attention or concentration.  Allow extra time to complete tasks.  Please allow *** weeks to make up missed assignments, test, quizzes.  Visual/Vestibular Accommodations in School:  Allow patient to eat lunch in quiet environment with 1-2 classmates.  Allow patient to leave class 5 minutes before end of period to avoid busy/noisy hallway.  Please provide any supplemental learning materials (power points, lecture notes, handouts, etc) in minimum size 18 font and allow/provide any auditory supplements to learning when possible (books on tape, audio tape lectures, etc) to limit visual stress in the classroom.  Patient is cleared for auditory participation only. Patient is not cleared for homework, quizzes, or tests at this time.   Testing:  May begin taking tests/quizzes on *** with no more than one test/quiz per day.   No significant classroom or standardized testing until ***.  Home/Extracurricular:  Lessen work/homework load to allow adequate cognitive rest. Work *** minutes with intervals of *** minute breaks (total *** hours).  Limit visual stimulants including: driving, watching television/movies, reading, using cell phone, etc. - to ensure relative visual cognitive rest. NOT cleared for video or phone games. May participate *** minutes with intervals of *** minute breaks (total *** hours).    Participation in physical activity: Patient is cleared to return to physical activity participation without restrictions.  Patient is not cleared for formal physical activity (includes physical education class, sports practices, sports games, weight training, etc) at this time.   However, we recommend that patient has 20-30 minutes of light cardiovascular activity daily, with NO risk of head injury (example: walking), staying below level of symptoms.  Recommend gradual progression to *** vestibular exercise (30-45 min/day) with no risk of head trauma while staying below level of symptoms.  See your exercise treatment menu for details.   Begin your exercise prescription on ____________ (see separate exercise prescription form)  Cleared for physical activity that poses NO RISK of head trauma.   Gradual return to physical activity under the supervision of a physician and/or athletic trainer  Once asymptomatic for 24 hours, patient may start Stage *** of CFPSM's { :10024} Exercise Progression Protocol. This is to be monitored by patient's { :28383}    Patient may start Stage *** of CFPSM's { :10024} Exercise Progression Protocol. This is to be monitored by patient's { :28383}.   Patient is not cleared for full contact activities, activities with risk of head trauma or unsupervised physical activity while participating in CFPSM's Exercise Progression. Check for return of symptoms (using Concussion Education Form Symptom List) when participating in activity and 24 hours following. If symptoms return, patient to contact our office for further recommendations.    [place] Recommendations form has been given to patient allowing *** to progress patient back into full participation.  Active Treatment  Strategies:  Fueling your brain is important for recovery. It is essential to stay well hydrated, aiming for half of your body weight in fluid ounces per day (100 lbs = 50 oz). We also  recommend eating breakfast to start your day and focus on a well-balanced diet containing lean protein, 'good' fats, and complex carbohydrates. See your nutrition / hydration handout for more details.   Quality sleep is vital in your concussion recovery. We encourage lots of sleep for the first 24-72 hours after injury but following this period it is important to regulate your sleep cycle. We encourage *** hours of quality sleep per night. See your sleep handout for more details and strategies to quality sleep.  IF NOT USING THE OPTIONS BELOW DELETE THEM  Treating your vestibular and visual dysfunction will decrease your recovery time and improve your symptoms. Begin your home vestibular exercise program as directed on your AVS.    Begin taking Amantadine medicine as directed.   Begin taking DHA supplement as directed.    Begin home exercise program for neck as directed.   Follow-up information:  Follow up appointment at [facility] in *** .   Call [facility] at [phone] at least 24 hours after completion of Stage 4 with status update.  Patient needs to arrive 30 minutes prior to appointment to complete the following tests: { :28378}.    Patient Education:  Reviewed with patient the risks (i.e, a repeat concussion, post-concussion syndrome, second-impact syndrome) of returning to play prior to complete resolution, and thoroughly reviewed the signs and symptoms of concussion.Reviewed need for complete resolution of all symptoms, with rest AND exertion, prior to return to play.  Reviewed red flags for urgent medical evaluation: worsening symptoms, nausea/vomiting, intractable headache, musculoskeletal changes, focal neurological deficits.  [facility]'s Concussion Care Plan, which clearly outlines the plans stated above, was given to patient.  I was personally involved with the physical evaluation of and am in agreement with the assessment and  treatment plan for this patient.  Greater than 50% of this encounter was spent in direct consultation with the patient in evaluation, counseling, and coordination of care. Duration of encounter: { :28531} minutes.  After Visit Summary printed out and provided to patient as appropriate.  This note is written by Lyndal Pulley, in the presence of and acting as the scribe of Lyndal Pulley, DO.

## 2016-08-27 ENCOUNTER — Emergency Department (HOSPITAL_COMMUNITY)
Admission: EM | Admit: 2016-08-27 | Discharge: 2016-08-27 | Disposition: A | Discharge status: Home / Self Care | Payer: Medicaid Other | Attending: Emergency Medicine | Admitting: Emergency Medicine

## 2016-08-27 ENCOUNTER — Ambulatory Visit: Payer: Medicaid Other | Admitting: Family Medicine

## 2016-08-27 ENCOUNTER — Encounter (HOSPITAL_COMMUNITY): Payer: Self-pay | Admitting: Emergency Medicine

## 2016-08-27 ENCOUNTER — Telehealth: Payer: Self-pay | Admitting: Family Medicine

## 2016-08-27 ENCOUNTER — Emergency Department (HOSPITAL_COMMUNITY): Payer: Medicaid Other

## 2016-08-27 DIAGNOSIS — W500XXA Accidental hit or strike by another person, initial encounter: Secondary | ICD-10-CM | POA: Insufficient documentation

## 2016-08-27 DIAGNOSIS — Y929 Unspecified place or not applicable: Secondary | ICD-10-CM | POA: Insufficient documentation

## 2016-08-27 DIAGNOSIS — M20011 Mallet finger of right finger(s): Secondary | ICD-10-CM | POA: Diagnosis not present

## 2016-08-27 DIAGNOSIS — Y9389 Activity, other specified: Secondary | ICD-10-CM | POA: Diagnosis not present

## 2016-08-27 DIAGNOSIS — Y999 Unspecified external cause status: Secondary | ICD-10-CM | POA: Insufficient documentation

## 2016-08-27 DIAGNOSIS — S6991XA Unspecified injury of right wrist, hand and finger(s), initial encounter: Secondary | ICD-10-CM | POA: Diagnosis present
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
## 2016-08-27 NOTE — Discharge Instructions (Signed)
Read the information below.  You may return to the Emergency Department at any time for worsening condition or any new symptoms that concern you.     It is very important that you wear the finger splint at all times.

## 2016-08-27 NOTE — Telephone Encounter (Signed)
This pt came in to the office over 20 minutes late. She was being seen for a concussion #2 visit. We rescheduled her for the next available appointment (5/22). She said that her balance has been off and this morning when she woke up she noticed that her left pinky finger was swollen and "arched". I told her that I would keep and eye out for a cancellation. She said that she might go over to the urgent care or ED to get her finger and balance checked out.

## 2016-08-31 NOTE — Telephone Encounter (Signed)
Patient scheduled Friday, May 11th.

## 2016-09-03 ENCOUNTER — Ambulatory Visit (INDEPENDENT_AMBULATORY_CARE_PROVIDER_SITE_OTHER): Payer: Medicaid Other | Admitting: Family Medicine

## 2016-09-03 ENCOUNTER — Encounter: Payer: Self-pay | Admitting: Family Medicine

## 2016-09-03 DIAGNOSIS — D509 Iron deficiency anemia, unspecified: Secondary | ICD-10-CM | POA: Diagnosis not present

## 2016-09-03 DIAGNOSIS — S060X0D Concussion without loss of consciousness, subsequent encounter: Secondary | ICD-10-CM

## 2016-09-03 NOTE — Progress Notes (Signed)
Subjective:   @VITALSMCOMMENTS @  Chief Complaint: Latoya Guzman, DOB: 1975-07-13, is a 41 y.o. female who presents for follow-up for head injury. She has not been back to work. She does seem to be improving with rest. Patient denies any true headache at this time. Has had other issues and injuries recently.  Chief Complaint  Patient presents with  . Head Injury    Injury date : 08/23/2016 Visit #: 2  History of Present Illness:    Concussion Self-Reported Symptom Score Reported symptoms were 42 down from 74 last visit   Review of Systems: Pertinent items are noted in HPI.  Review of History: Past Medical History:  Past Medical History:  Diagnosis Date  . Anemia 12/19/2012  . Antiphospholipid antibody positive   . Anxiety   . Blood transfusion 1998; 2015   Post C/S; related to anemia  . Chronic bronchitis ([place])   . Depression    Was on Lexapro Stopped when pregnant  . Fibroid 03/2010   Noted on [place] in Dec.  . Headache    "weekly" (01/01/2015)  . Lupus Lupus Antigen    associated with pregnancy   . 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 Pica    toilet tissue  . Preterm labor    PTL last two pregnancies  . Sickle cell trait ([place])   . Tricuspid valve regurgitation 02/27/2010    Past Surgical History:  has a past surgical history that includes Cesarean section (1998, 2009); Tubal ligation (11/14/2010); Cesarean section (11/14/2010); Dilation and curettage of uterus (2005, 2006); and Esophagogastroduodenoscopy (N/A, 01/02/2015). Family History: family history includes Cancer in her maternal grandmother; Diabetes in her maternal uncle; Hypertension in her mother; Lupus in her sister. Social History:  reports that she has never smoked. She has never used smokeless tobacco. She reports that she drinks alcohol. She reports that she does not use drugs. Current Medications: has a current medication list which includes the  following prescription(s): gabapentin and ibuprofen. Allergies: has No Known Allergies.  Objective:    Physical Examination Vitals:   09/03/16 0857  BP: 102/80  Pulse: 62   General appearance: alert, appears stated age and cooperative Head: Normocephalic, without obvious abnormality, atraumatic Eyes: conjunctivae/corneas clear. PERRL, EOM's intact. Fundi benign. Sclera anicteric. Lungs: clear to auscultation bilaterally and percussion Heart: regular rate and rhythm, S1, S2 normal, no murmur, click, rub or gallop Neurologic: CN 2-12 normal.  Sensation to pain, touch, and proprioception normal.  DTRs  normal in upper and lower extremities. No pathologic reflexes. Neg rhomberg, modified rhomberg, pronator drift, tandem gait, finger-to-nose; see post-concussion vestibular and oculomotor testing in chart Psychiatric: Oriented X3, intact recent and remote memory, judgement and insight, normal mood and affect    Assessment:    No diagnosis found.  [name] presents with the following concussion subtypes. [] Cognitive [] Cervical [] Vestibular [] Ocular [] Migraine [x] Anxiety/Mood   Plan:   Action/Discussion: Reviewed diagnosis, management options, expected outcomes, and the reasons for scheduled and emergent follow-up. Questions were adequately answered. Patient expressed verbal understanding and agreement with the following plan.     I was personally involved with the physical evaluation of and am in agreement with the assessment and treatment plan for this patient.  Greater than 50% of this encounter was spent in direct consultation with the patient in evaluation, counseling, and coordination of care. Duration of encounter: 45 minutes.  After Visit Summary printed out and provided to patient as appropriate.  This note is written by Lyndal Pulley, in the presence of and acting as the scribe of Lyndal Pulley, DO.

## 2016-09-03 NOTE — Assessment & Plan Note (Signed)
I still mostly resolved at this time. Patient has many comorbidities that could be contribute to some of the unresolving difficulty. Patient does have lupus as well as history of migraines an iron deficiency. Encourage her to take iron on a regular basis. We discussed icing regimen. Patient will follow-up with me on a more as-needed basis.

## 2016-09-03 NOTE — Patient Instructions (Signed)
Good to see you  Iron 65mg  daily with 500mg  of vitamin C  Fish oil 2 grams daily  We will get you back to work on Monday  See me only if you need me.

## 2016-09-03 NOTE — Assessment & Plan Note (Signed)
Encourage supplementation 

## 2016-09-14 ENCOUNTER — Ambulatory Visit: Payer: Medicaid Other | Admitting: Family Medicine

## 2016-09-23 ENCOUNTER — Encounter: Payer: Self-pay | Admitting: Family Medicine

## 2016-09-23 ENCOUNTER — Ambulatory Visit (INDEPENDENT_AMBULATORY_CARE_PROVIDER_SITE_OTHER): Payer: Medicaid Other | Admitting: Family Medicine

## 2016-09-23 VITALS — BP 110/70 | HR 76 | Temp 98.0°F | Ht 71.0 in | Wt 244.0 lb

## 2016-09-23 DIAGNOSIS — R350 Frequency of micturition: Secondary | ICD-10-CM | POA: Diagnosis not present

## 2016-09-23 DIAGNOSIS — M20011 Mallet finger of right finger(s): Secondary | ICD-10-CM

## 2016-09-23 LAB — POCT URINALYSIS DIP (MANUAL ENTRY)
BILIRUBIN UA: NEGATIVE mg/dL
Bilirubin, UA: NEGATIVE
Blood, UA: NEGATIVE
Glucose, UA: NEGATIVE mg/dL
Nitrite, UA: NEGATIVE
PROTEIN UA: NEGATIVE mg/dL
Urobilinogen, UA: 0.2 E.U./dL
pH, UA: 5.5 (ref 5.0–8.0)

## 2016-09-23 LAB — POCT UA - MICROSCOPIC ONLY

## 2016-09-23 NOTE — Assessment & Plan Note (Signed)
Patient suffered extensor tendon injury ~4 weeks ago. Failed to f/u with hand surgery. Endorses compliance w/ splint but patient continues to have limited/no active extension of DIP. - finger appears neurovascularly intact on exam. - advised to contact hand surgery office to reschedule appt.  - hand surgery office name, number, address, and physician name provided (hand written for patient) - new splint and clean/dry dressing provided by nursing. - f/u PRN

## 2016-09-23 NOTE — Assessment & Plan Note (Signed)
Endorses very vague symptoms today. Unable to say how many voids in past 24hrs. No dysuria. No foul odor. UA unremarkable. Etiology seems likely 2/2 increase PO intake, and thus not UTI. - reassurance.  - discussed adequate hydration - return precautions discussed.

## 2016-09-23 NOTE — Progress Notes (Signed)
   HPI  CC: Finger Pain and Urinary Frequency Finger pain - patient seen in ED 1 month ago for injury to Rt 5th digit. Dx'd w/ mallet finger.  - splinted and asked to f/u w/ hand surgery - patient "unable" to make f/u w/ hand surgery - Now experiencing increased discomfort, swelling of the effected finger. - currently swelling is "better" than previously - endorses compliance with splint. - Denies new injury - has not used or tried to use finger since injury.  Urinary frequency - vague sxs. - urinating "a lot"; no pain; no fever; no foul odor; no vaginal DC  Review of Systems See HPI for ROS.   Objective: BP 110/70   Pulse 76   Temp 98 F (36.7 C) (Oral)   Ht 5\' 11"  (1.803 m)   Wt 244 lb (110.7 kg)   LMP 08/24/2016   SpO2 98%   BMI 34.03 kg/m  Gen: NAD, alert, cooperative CV: well perfused Resp: non-labored 5th digit, UE, Right: minimal edema, epidermal sloughing c/w prolonged moisture and poor hygiene, no erythema/ecchymosis, unable to extend DIP actively (ROM intact passively); sensation intact; capillary refill <2 sec. Neuro: Alert and oriented, Speech clear, tangential thought processes.   Assessment and plan:  Mallet finger of right hand Patient suffered extensor tendon injury ~4 weeks ago. Failed to f/u with hand surgery. Endorses compliance w/ splint but patient continues to have limited/no active extension of DIP. - finger appears neurovascularly intact on exam. - advised to contact hand surgery office to reschedule appt.  - hand surgery office name, number, address, and physician name provided (hand written for patient) - new splint and clean/dry dressing provided by nursing. - f/u PRN  Urinary frequency Endorses very vague symptoms today. Unable to say how many voids in past 24hrs. No dysuria. No foul odor. UA unremarkable. Etiology seems likely 2/2 increase PO intake, and thus not UTI. - reassurance.  - discussed adequate hydration - return precautions  discussed.   Orders Placed This Encounter  Procedures  . POCT urinalysis dipstick  . POCT UA - Microscopic Only    Elberta Leatherwood, MD,MS,  PGY3 09/23/2016 5:50 PM

## 2016-09-23 NOTE — Patient Instructions (Signed)
It was a pleasure seeing you today in our clinic. Today we discussed your finger and urinary frequency. Here is the treatment plan we have discussed and agreed upon together:   - I strongly encouraged that you contact the hand surgeon's office today to set up an appointment as soon as possible. I have provided you the number for his office, the address, and the name. - Unfortunately, due to the amount of time that has gone by since your injury I'm afraid that there may be some long-term complications of this finger injury, but I hope that this is not the case.  - Regarding your urinary frequency. I did not see any significant evidence consistent with a urinary tract infection today. Continue to stay well hydrated with water. Avoid sugary beverages like juice and soda. Also, caffeine can irritate the bladder which can also cause increased frequency of urination -- so avoid this as well. - Follow-up with your PCP if your symptoms persist or worsen.

## 2016-09-28 DIAGNOSIS — M20011 Mallet finger of right finger(s): Secondary | ICD-10-CM | POA: Diagnosis not present

## 2016-09-28 DIAGNOSIS — M79642 Pain in left hand: Secondary | ICD-10-CM | POA: Diagnosis not present

## 2016-09-28 DIAGNOSIS — M79641 Pain in right hand: Secondary | ICD-10-CM | POA: Diagnosis not present

## 2016-10-01 ENCOUNTER — Emergency Department (HOSPITAL_COMMUNITY)
Admission: EM | Admit: 2016-10-01 | Discharge: 2016-10-01 | Disposition: A | Discharge status: Home / Self Care | Payer: Medicaid Other | Attending: Emergency Medicine | Admitting: Emergency Medicine

## 2016-10-01 ENCOUNTER — Encounter (HOSPITAL_COMMUNITY): Payer: Self-pay | Admitting: *Deleted

## 2016-10-01 DIAGNOSIS — M545 Low back pain: Secondary | ICD-10-CM | POA: Diagnosis not present

## 2016-10-01 DIAGNOSIS — M25511 Pain in right shoulder: Secondary | ICD-10-CM | POA: Diagnosis not present

## 2016-10-01 DIAGNOSIS — S3992XA Unspecified injury of lower back, initial encounter: Secondary | ICD-10-CM | POA: Diagnosis present

## 2016-10-01 DIAGNOSIS — Y999 Unspecified external cause status: Secondary | ICD-10-CM | POA: Diagnosis not present

## 2016-10-01 DIAGNOSIS — M542 Cervicalgia: Secondary | ICD-10-CM | POA: Insufficient documentation

## 2016-10-01 DIAGNOSIS — Y9389 Activity, other specified: Secondary | ICD-10-CM | POA: Insufficient documentation

## 2016-10-01 DIAGNOSIS — R51 Headache: Secondary | ICD-10-CM | POA: Insufficient documentation

## 2016-10-01 DIAGNOSIS — R0789 Other chest pain: Secondary | ICD-10-CM | POA: Insufficient documentation

## 2016-10-01 DIAGNOSIS — Y92410 Unspecified street and highway as the place of occurrence of the external cause: Secondary | ICD-10-CM | POA: Diagnosis not present

## 2016-10-01 MED ORDER — IBUPROFEN 600 MG PO TABS: 600.0000 mg | ORAL_TABLET | Freq: Four times a day (QID) | ORAL | 0 refills | Status: DC | PRN

## 2016-10-01 MED ORDER — CYCLOBENZAPRINE HCL 10 MG PO TABS: 10.0000 mg | ORAL_TABLET | Freq: Two times a day (BID) | ORAL | 0 refills | Status: DC | PRN
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
## 2016-11-26 ENCOUNTER — Ambulatory Visit: Payer: Medicaid Other | Admitting: Internal Medicine

## 2016-12-08 ENCOUNTER — Telehealth: Payer: Self-pay

## 2016-12-08 NOTE — Telephone Encounter (Signed)
Spoke with patient to ask what was bringing her to see Dr.End. She stated she was having her wisdom teeth pulled and was told because of her heart problems she needed to see a heart doctor. She said she had previously seen a "woman doctor" when it was [facility]. I asked her if Dr.Turner sounded familiar and she stated "Yes, that's who I saw." I informed the patient I would call and see if I could get records from when she saw Dr.Turner and if there were any more questions, someone would call her back. She was appreciative for the call.  Called [facility] and requested records. Patient seen in 2010 and 2011. Medical records department said they would fax records over.

## 2016-12-31 ENCOUNTER — Ambulatory Visit (INDEPENDENT_AMBULATORY_CARE_PROVIDER_SITE_OTHER): Payer: Medicaid Other | Admitting: Internal Medicine

## 2016-12-31 ENCOUNTER — Encounter: Payer: Self-pay | Admitting: Internal Medicine

## 2016-12-31 VITALS — BP 130/64 | HR 94 | Ht 71.0 in | Wt 241.0 lb

## 2016-12-31 DIAGNOSIS — R0789 Other chest pain: Secondary | ICD-10-CM

## 2016-12-31 DIAGNOSIS — Z01810 Encounter for preprocedural cardiovascular examination: Secondary | ICD-10-CM

## 2016-12-31 DIAGNOSIS — I38 Endocarditis, valve unspecified: Secondary | ICD-10-CM | POA: Diagnosis not present

## 2016-12-31 DIAGNOSIS — R0602 Shortness of breath: Secondary | ICD-10-CM

## 2016-12-31 MED ORDER — FAMOTIDINE 20 MG PO TABS: 20.0000 mg | ORAL_TABLET | Freq: Two times a day (BID) | ORAL | 3 refills | Status: DC

## 2016-12-31 NOTE — Progress Notes (Signed)
New Outpatient Visit Date: 12/31/2016  Referring Provider: Carlyle Dolly, MD [place], [place] [address]  Chief Complaint: Chest pain  HPI:  Latoya Guzman is a 41 y.o. female who is being seen today for the evaluation of chest pain at the request of Gambino, Christina M, MD. She has a history of antiphospholipid antibodies, anemia with sickle cell trait, chronic bronchitis, depression and anxiety. She was recently evaluated by her dentist in anticipation of wisdom teeth extraction. Due to intermittent chest pain and history of heart disease, she has been referred for further evaluation. Latoya Guzman reports frequent chest pain, which is sometimes pressure-like and sometimes just "a pain." When it occurs, its intensity is 10/10, typically lasting about 5 minutes. The discomfort comes on randomly and is not associated with exertion or other particular activities. She notes accompanying shortness of breath, hand numbness, and occasional blurry vision. She also notes palpitations, as if her heart is "pounding," though these do not consistently accompany the chest pain. She has tried naproxen with gradual improvement in the chest pain.  Of note, Latoya Guzman experienced similar pain around 2012, at which time she was evaluated by Dr. Radford Pax. Echo revealed trace mitral, tricuspid, and pulmonic regurgitation. Exercise tolerance test was reportedly negative. Further workup was not pursued, as the patient subsequently discovered that she was pregnant. She continued to have the chest pain for several years but hen noticed improvement and ultimate resolution of the pain for about a year. It started again about 2-3 years ago and is now similar to what she felt in 2012.  --------------------------------------------------------------------------------------------------  Cardiovascular History & Procedures: Cardiovascular Problems:  Atypical chest pain  Risk Factors:  None  Cath/PCI:  None  CV  Surgery:  None  EP Procedures and Devices:  None  Non-Invasive Evaluation(s):  TTE (09/17/10): Normal LV size and function. LVEF 65%. Trace MR, TR, and PR. Normal RV size and contraction.  ETT (02/27/10): 1 mm horizontal/upsloping ST depression in inferior leads. Patient complained of 5/10 chest pain during recovery. Patient exercised 5:05 minutes.  Recent CV Pertinent Labs: Lab Results  Component Value Date   CHOL 130 02/25/2016   HDL 48 02/25/2016   LDLCALC 71 02/25/2016   TRIG 53 02/25/2016   CHOLHDL 2.7 02/25/2016   INR 1.12 01/01/2015   K 4.2 06/08/2016   BUN 14 06/08/2016   CREATININE 0.96 06/08/2016   CREATININE 0.87 09/14/2013    --------------------------------------------------------------------------------------------------  Past Medical History:  Diagnosis Date  . Anemia 12/19/2012  . Antiphospholipid antibody positive   . Anxiety   . Blood transfusion 1998; 2015   Post C/S; related to anemia  . Chronic bronchitis ([place])   . Depression    Was on Lexapro Stopped when pregnant  . Fibroid 03/2010   Noted on [place] in Dec.  . Headache    "weekly" (01/01/2015)  . Lupus Lupus Antigen    associated with pregnancy   . 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 Pica    toilet tissue  . Preterm labor    PTL last two pregnancies  . Sickle cell trait ([place])   . Tricuspid valve regurgitation 02/27/2010    Past Surgical History:  Procedure Laterality Date  . [place], 2009  . CESAREAN SECTION  11/14/2010   Procedure: CESAREAN SECTION;  Surgeon: Juliene Pina C. Hulan Fray, MD;  Location: [facility];  Service: Gynecology;  Laterality: N/A;  Repeat cesarean section with  delivery of baby boy at 16. Apgars 9/9. Bilateral tubal ligation with filshie clips.   Marland Kitchen DILATION AND CURETTAGE OF UTERUS  2005, 2006   1st for twin loss, 2nd for retained placenta  . ESOPHAGOGASTRODUODENOSCOPY N/A 01/02/2015   Procedure:  ESOPHAGOGASTRODUODENOSCOPY (EGD);  Surgeon: Manus Gunning, MD;  Location: [facility];  Service: Gastroenterology;  Laterality: N/A;  . TUBAL LIGATION  11/14/2010    Current Meds  Medication Sig  . gabapentin (NEURONTIN) 300 MG capsule Take 1 to 2 tablets at bedtime (Patient taking differently: Take 300-600 mg by mouth. Take 1 to 2 tablets at bedtime)  . ibuprofen (ADVIL,MOTRIN) 600 MG tablet Take 1 tablet (600 mg total) by mouth every 6 (six) hours as needed for moderate pain.  . naproxen (NAPROSYN) 500 MG tablet Take 500 mg by mouth 2 (two) times daily as needed (pain).    Allergies: Patient has no known allergies.  Social History   Social History  . Marital status: Single    Spouse name: N/A  . Number of children: N/A  . Years of education: N/A   Occupational History  . Not on file.   Social History Main Topics  . Smoking status: Never Smoker  . Smokeless tobacco: Never Used  . Alcohol use Yes     Comment: 01/01/2015 "might have a couple drinks/year"  . Drug use: No  . Sexual activity: Not Currently    Birth control/ protection: Surgical   Other Topics Concern  . Not on file   Social History Narrative  . No narrative on file    Family History  Problem Relation Age of Onset  . Hypertension Mother   . Lupus Sister   . Diabetes Maternal Uncle   . Cancer Maternal Grandmother   . Colon cancer Neg Hx     Review of Systems: Review of Systems  Constitutional: Positive for diaphoresis and weight loss.  HENT: Negative.   Eyes: Negative.   Respiratory: Negative.   Cardiovascular: Positive for chest pain, palpitations and leg swelling (when standing for extended periods).  Gastrointestinal: Negative.   Genitourinary: Negative.   Musculoskeletal: Negative.   Skin: Negative.   Neurological: Positive for headaches.  Endo/Heme/Allergies: Bruises/bleeds easily.  Psychiatric/Behavioral: Negative.     --------------------------------------------------------------------------------------------------  Physical Exam: BP 130/64   Pulse 94   Ht 5\' 11"  (1.803 m)   Wt 241 lb (109.3 kg)   BMI 33.61 kg/m   General:  Obese woman, seated comfortably in the exam room. HEENT: No conjunctival pallor or scleral icterus. Moist mucous membranes. OP clear. Neck: Supple without lymphadenopathy, thyromegaly, JVD, or HJR. No carotid bruit. Lungs: Normal work of breathing. Clear to auscultation bilaterally without wheezes or crackles. Heart: Regular rate and rhythm without murmurs, rubs, or gallops. Non-displaced PMI. Abd: Bowel sounds present. Soft, NT/ND without hepatosplenomegaly Ext: No lower extremity edema. Radial, PT, and DP pulses are 2+ bilaterally Skin: Warm and dry without rash. Neuro: CNIII-XII intact. Strength and fine-touch sensation intact in upper and lower extremities bilaterally. Psych: Normal mood and affect.  EKG:  Normal sinus rhythm, rightward axis. No other significant abnormalities.  Lab Results  Component Value Date   WBC 5.8 06/08/2016   HGB 9.7 (L) 06/08/2016   HCT 29.2 (L) 06/08/2016   MCV 71.6 (L) 06/08/2016   PLT 274 06/08/2016    Lab Results  Component Value Date   NA 140 06/08/2016   K 4.2 06/08/2016   CL 107 06/08/2016   CO2 25 06/08/2016   BUN 14  06/08/2016   CREATININE 0.96 06/08/2016   GLUCOSE 92 06/08/2016   ALT 26 06/08/2016    Lab Results  Component Value Date   CHOL 130 02/25/2016   HDL 48 02/25/2016   LDLCALC 71 02/25/2016   TRIG 53 02/25/2016   CHOLHDL 2.7 02/25/2016    --------------------------------------------------------------------------------------------------  ASSESSMENT AND PLAN: Atypical chest pain and shortness of breath Chronic problem, not clearly related to exertion. EKG today without ischemic changes. Patient has minimal risk factors for CAD. We have discussed conservative therapy versus ischemia evaluation and have  agreed to an exercise tolerance test and transthoracic echocardiogram. We will also start famotidine 20 mg BID for possible component of GERD.  Valvular heart disease (mitral, tricuspid, and pulmonic regurgitation) Trivial regurgitation previously noted on echo in 2012. Given persistent leg edema, we will repeat an echo.  Preoperative cardiac evaluation Latoya. Swim anticipated wisdom teeth extraction in the near future. This is a low risk procedure. However, given recurrent chest pain and history of valvular heart disease noted above, we will proceed with ETT and echo. If these studies are unremarkable, it would be fine for Latoya Guzman to proceed with her dental surgery.  Follow-up: Return to clinic as needed based on results of studies.  Nelva Bush, MD 12/31/2016 2:16 PM

## 2016-12-31 NOTE — Patient Instructions (Signed)
Medication Instructions:  Your physician has recommended you make the following change in your medication:   START famotidine (pepcid) 20 mg twice daily   Labwork: None ordered  Testing/Procedures: Your physician has requested that you have an echocardiogram. Echocardiography is a painless test that uses sound waves to create images of your heart. It provides your doctor with information about the size and shape of your heart and how well your heart's chambers and valves are working. This procedure takes approximately one hour. There are no restrictions for this procedure.  Your physician has requested that you have an exercise tolerance test. For further information please visit [email or web address]. Please also follow instruction sheet, as given.   Follow-Up: Based on test results  Any Other Special Instructions Will Be Listed Below (If Applicable).     If you need a refill on your cardiac medications before your next appointment, please call your pharmacy.

## 2017-01-01 ENCOUNTER — Encounter: Payer: Self-pay | Admitting: Internal Medicine

## 2017-01-01 DIAGNOSIS — I38 Endocarditis, valve unspecified: Secondary | ICD-10-CM | POA: Insufficient documentation

## 2017-01-10 ENCOUNTER — Ambulatory Visit (INDEPENDENT_AMBULATORY_CARE_PROVIDER_SITE_OTHER): Payer: Medicaid Other | Admitting: Internal Medicine

## 2017-01-10 ENCOUNTER — Encounter: Payer: Self-pay | Admitting: Internal Medicine

## 2017-01-10 DIAGNOSIS — G4452 New daily persistent headache (NDPH): Secondary | ICD-10-CM | POA: Diagnosis present

## 2017-01-10 DIAGNOSIS — R51 Headache: Secondary | ICD-10-CM

## 2017-01-10 DIAGNOSIS — R519 Headache, unspecified: Secondary | ICD-10-CM | POA: Insufficient documentation

## 2017-01-10 NOTE — Progress Notes (Signed)
   Subjective:   Patient: Latoya Guzman       Birthdate: November 15, 1975       MRN: [id number]      HPI  Latoya Guzman is a 41 y.o. female presenting for same day appointment for headaches.   Headache Began about 2.5 months ago, but increasing in frequency. Now occurring at least once daily. Primarily located at R temple extending into R frontal region, but has occurred on L a couple times. Awakens her from sleep at night. Also has unilateral blurred vision, to the point where she cannot see from the affected eye. Endorses photophobia and phonophobia but denies nausea or vomiting. Lasts from 1-2 hours. Sometimes relieved by 800mg  ibuprofen. Denies numbness, weakness, or any neurological deficits. No aura. Has a very mild HA now.  Did have a concussion 4 months ago for which she was seen in ED but has not required additional f/u for that. No history of migraines. Has had mild headaches on and off for the past two years but nothing like these.   Smoking status reviewed. Patient is never smoker.   Review of Systems See HPI.     Objective:  Physical Exam  Constitutional: She is oriented to person, place, and time and well-developed, well-nourished, and in no distress.  HENT:  Head: Normocephalic and atraumatic.  Eyes: Pupils are equal, round, and reactive to light. Conjunctivae and EOM are normal. Right eye exhibits no discharge. Left eye exhibits no discharge.  Pulmonary/Chest: Effort normal. No respiratory distress.  Neurological: She is alert and oriented to person, place, and time. Gait normal.  CN II-XII intact. 5/5 strength upper and lower extremities bilaterally.   Skin: Skin is warm and dry.  Psychiatric: Affect and judgment normal.      Assessment & Plan:  Headache Differential includes migraine, cluster headaches, or possible tumor. Red flags including headache awakening from sleep as well as unilateral vision changes concerning for tumor, possibly affecting optic chiasm.  Cluster headache possible given frequency of headaches as well as brevity. Could be abnormal presentation of migraine given photophobia and phonophobia, but no other typical migraine symptoms and no history of migraines, so feel this is less likely. Given red flags, will proceed with further work-up.  - MRI brain w/wo contrast - Can continue to take ibuprofen 800mg  as soon as headache starts - Will call patient with MRI results and proceed with work-up accordingly - Strict return precautions and reasons to go to ED discussed - Precepted with Dr. Lamar Benes, MD, MPH PGY-3 [facility] Pager [phone]

## 2017-01-10 NOTE — Assessment & Plan Note (Addendum)
Differential includes migraine, cluster headaches, or possible tumor. Red flags including headache awakening from sleep as well as unilateral vision changes concerning for tumor, possibly affecting optic chiasm. Cluster headache possible given frequency of headaches as well as brevity. Could be abnormal presentation of migraine given photophobia and phonophobia, but no other typical migraine symptoms and no history of migraines, so feel this is less likely. Given red flags, will proceed with further work-up.  - MRI brain w/wo contrast - Can continue to take ibuprofen 800mg  as soon as headache starts - Will call patient with MRI results and proceed with work-up accordingly - Strict return precautions and reasons to go to ED discussed

## 2017-01-10 NOTE — Patient Instructions (Addendum)
It was nice meeting you today Ms. Latoya Guzman!  I have ordered an MRI for you. You will be called with the date and time of this.   In the meantime, you can take ibuprofen for your headaches. Be sure to take it as soon as your headache starts.   If your symptoms worsen, please go to the emergency room.   If you have any questions or concerns, please feel free to call the clinic.   Be well,  Dr. Avon Gully

## 2017-01-11 ENCOUNTER — Other Ambulatory Visit (HOSPITAL_COMMUNITY): Payer: Medicaid Other

## 2017-01-15 ENCOUNTER — Inpatient Hospital Stay (HOSPITAL_COMMUNITY)
Admission: AD | Admit: 2017-01-15 | Discharge: 2017-01-15 | Disposition: A | Discharge status: Home / Self Care | Payer: Medicaid Other | Source: Ambulatory Visit | Attending: Obstetrics & Gynecology | Admitting: Obstetrics & Gynecology

## 2017-01-15 ENCOUNTER — Encounter (HOSPITAL_COMMUNITY): Payer: Self-pay | Admitting: *Deleted

## 2017-01-15 DIAGNOSIS — N939 Abnormal uterine and vaginal bleeding, unspecified: Secondary | ICD-10-CM

## 2017-01-15 DIAGNOSIS — N760 Acute vaginitis: Secondary | ICD-10-CM | POA: Insufficient documentation

## 2017-01-15 DIAGNOSIS — R1030 Lower abdominal pain, unspecified: Secondary | ICD-10-CM | POA: Diagnosis not present

## 2017-01-15 DIAGNOSIS — K5900 Constipation, unspecified: Secondary | ICD-10-CM

## 2017-01-15 DIAGNOSIS — B9689 Other specified bacterial agents as the cause of diseases classified elsewhere: Secondary | ICD-10-CM

## 2017-01-15 DIAGNOSIS — R1032 Left lower quadrant pain: Secondary | ICD-10-CM

## 2017-01-15 DIAGNOSIS — T192XXA Foreign body in vulva and vagina, initial encounter: Secondary | ICD-10-CM | POA: Diagnosis not present

## 2017-01-15 LAB — CBC
HEMATOCRIT: 27.7 % — AB (ref 36.0–46.0)
Hemoglobin: 9 g/dL — ABNORMAL LOW (ref 12.0–15.0)
MCH: 20.9 pg — ABNORMAL LOW (ref 26.0–34.0)
MCHC: 32.5 g/dL (ref 30.0–36.0)
MCV: 64.4 fL — AB (ref 78.0–100.0)
PLATELETS: 289 10*3/uL (ref 150–400)
RBC: 4.3 MIL/uL (ref 3.87–5.11)
RDW: 18.3 % — AB (ref 11.5–15.5)
WBC: 6.7 10*3/uL (ref 4.0–10.5)

## 2017-01-15 LAB — WET PREP, GENITAL
SPERM: NONE SEEN
TRICH WET PREP: NONE SEEN
YEAST WET PREP: NONE SEEN

## 2017-01-15 LAB — URINALYSIS, ROUTINE W REFLEX MICROSCOPIC
Bilirubin Urine: NEGATIVE
GLUCOSE, UA: NEGATIVE mg/dL
Hgb urine dipstick: NEGATIVE
Ketones, ur: NEGATIVE mg/dL
LEUKOCYTES UA: NEGATIVE
NITRITE: NEGATIVE
PH: 6 (ref 5.0–8.0)
Protein, ur: NEGATIVE mg/dL
SPECIFIC GRAVITY, URINE: 1.016 (ref 1.005–1.030)

## 2017-01-15 LAB — POCT PREGNANCY, URINE: Preg Test, Ur: NEGATIVE

## 2017-01-15 NOTE — MAU Note (Signed)
Pt reports she started her period on 09/10, states she had bleeding for 5 days then it stopped for one day and since then she has had brownish discharge. Also reports lower abd cramping for the last 2 days.

## 2017-01-15 NOTE — MAU Provider Note (Signed)
History     CSN: [id number]  Arrival date and time: 01/15/17 1639   First Provider Initiated Contact with Patient 01/15/17 1856      Chief Complaint  Patient presents with  . Vaginal Bleeding  . Abdominal Pain   HPI   Latoya Guzman is a 41 y.o. female non-pregnant here in MAU with vaginal bleeding and LLQ abdominal pain. She started her cycle on 9/10; for the past 2 months she has been having her cycle every 2 weeks and the cycle is lasting 7 days. The bleeding on 9/10 was heavy then light and stopped. Since then her discharge has been spotting and brown. The pain in her LLQ comes and goes. She tried taking aleve which did not help.  No new sexual partners.   OB History    Gravida Para Term Preterm AB Living   11 8 6 2 2 7    SAB TAB Ectopic Multiple Live Births   2     0 7      Past Medical History:  Diagnosis Date  . Anemia 12/19/2012  . Antiphospholipid antibody positive   . Anxiety   . Blood transfusion 1998; 2015   Post C/S; related to anemia  . Chronic bronchitis ([place])   . Depression    Was on Lexapro Stopped when pregnant  . Fibroid 03/2010   Noted on [place] in Dec.  . Headache    "weekly" (01/01/2015)  . Lupus Lupus Antigen    associated with pregnancy   . 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 Pica    toilet tissue  . Preterm labor    PTL last two pregnancies  . Sickle cell trait ([place])   . Tricuspid valve regurgitation 02/27/2010    Past Surgical History:  Procedure Laterality Date  . [place], 2009  . CESAREAN SECTION  11/14/2010   Procedure: CESAREAN SECTION;  Surgeon: Juliene Pina C. Hulan Fray, MD;  Location: [facility];  Service: Gynecology;  Laterality: N/A;  Repeat cesarean section with delivery of baby boy at 27. Apgars 9/9. Bilateral tubal ligation with filshie clips.   Marland Kitchen DILATION AND CURETTAGE OF UTERUS  2005, 2006   1st for twin loss, 2nd for retained placenta  .  ESOPHAGOGASTRODUODENOSCOPY N/A 01/02/2015   Procedure: ESOPHAGOGASTRODUODENOSCOPY (EGD);  Surgeon: Manus Gunning, MD;  Location: [facility];  Service: Gastroenterology;  Laterality: N/A;  . TUBAL LIGATION  11/14/2010    Family History  Problem Relation Age of Onset  . Hypertension Mother   . Lupus Sister   . Diabetes Maternal Uncle   . Cancer Maternal Grandmother   . Colon cancer Neg Hx     Social History  Substance Use Topics  . Smoking status: Never Smoker  . Smokeless tobacco: Never Used  . Alcohol use Yes     Comment: 01/01/2015 "might have a couple drinks/year"    Allergies: No Known Allergies  Prescriptions Prior to Admission  Medication Sig Dispense Refill Last Dose  . cyclobenzaprine (FLEXERIL) 10 MG tablet Take 1 tablet (10 mg total) by mouth 2 (two) times daily as needed for muscle spasms. (Patient not taking: Reported on 12/31/2016) 20 tablet 0 Not Taking  . famotidine (PEPCID) 20 MG tablet Take 1 tablet (20 mg total) by mouth 2 (two) times daily. 180 tablet 3   . gabapentin (NEURONTIN) 300 MG capsule Take 1 to 2 tablets at bedtime (Patient taking differently: Take 300-600 mg by  mouth. Take 1 to 2 tablets at bedtime) 90 capsule 1 Taking  . ibuprofen (ADVIL,MOTRIN) 600 MG tablet Take 1 tablet (600 mg total) by mouth every 6 (six) hours as needed for moderate pain. 21 tablet 0 Taking  . naproxen (NAPROSYN) 500 MG tablet Take 500 mg by mouth 2 (two) times daily as needed (pain).   Taking   Results for orders placed or performed during the hospital encounter of 01/15/17 (from the past 48 hour(s))  Urinalysis, Routine w reflex microscopic     Status: Abnormal   Collection Time: 01/15/17  5:16 PM  Result Value Ref Range   Color, Urine YELLOW YELLOW   APPearance HAZY (A) CLEAR   Specific Gravity, Urine 1.016 1.005 - 1.030   pH 6.0 5.0 - 8.0   Glucose, UA NEGATIVE NEGATIVE mg/dL   Hgb urine dipstick NEGATIVE NEGATIVE   Bilirubin Urine NEGATIVE NEGATIVE   Ketones, ur  NEGATIVE NEGATIVE mg/dL   Protein, ur NEGATIVE NEGATIVE mg/dL   Nitrite NEGATIVE NEGATIVE   Leukocytes, UA NEGATIVE NEGATIVE  Pregnancy, urine POC     Status: None   Collection Time: 01/15/17  6:46 PM  Result Value Ref Range   Preg Test, Ur NEGATIVE NEGATIVE    Comment:        THE SENSITIVITY OF THIS METHODOLOGY IS >24 mIU/mL   Wet prep, genital     Status: Abnormal   Collection Time: 01/15/17  7:09 PM  Result Value Ref Range   Yeast Wet Prep HPF POC NONE SEEN NONE SEEN   Trich, Wet Prep NONE SEEN NONE SEEN   Clue Cells Wet Prep HPF POC PRESENT (A) NONE SEEN   WBC, Wet Prep HPF POC FEW (A) NONE SEEN    Comment: MANY BACTERIA SEEN   Sperm NONE SEEN   CBC     Status: Abnormal   Collection Time: 01/15/17  7:18 PM  Result Value Ref Range   WBC 6.7 4.0 - 10.5 K/uL   RBC 4.30 3.87 - 5.11 MIL/uL   Hemoglobin 9.0 (L) 12.0 - 15.0 g/dL   HCT 27.7 (L) 36.0 - 46.0 %   MCV 64.4 (L) 78.0 - 100.0 fL   MCH 20.9 (L) 26.0 - 34.0 pg   MCHC 32.5 30.0 - 36.0 g/dL   RDW 18.3 (H) 11.5 - 15.5 %   Platelets 289 150 - 400 K/uL   Review of Systems  Constitutional: Negative for fever.  Gastrointestinal: Positive for constipation (Last BM was today; hard stool, difficult to have BM) and nausea. Negative for diarrhea and vomiting.  Genitourinary: Positive for vaginal bleeding and vaginal discharge.   Physical Exam   Blood pressure 128/86, pulse 69, temperature 98.1 F (36.7 C), temperature source Oral, resp. rate 15, height 5\' 11"  (1.803 m), weight 243 lb (110.2 kg), last menstrual period 01/03/2017, SpO2 100 %, unknown if currently breastfeeding.  Physical Exam  Genitourinary:  Genitourinary Comments: Vagina - Small amount of brown, thin vaginal discharge, foul smelling retained, brown tampon. Removed without difficulty. Some spotting following removal from os.  Cervix - +contact bleeding Bimanual exam: Cervix closed, no CMT  Uterus non tender, normal size Adnexa non tender, no masses  bilaterally GC/Chlam, wet prep done Chaperone present for exam.    MAU Course  Procedures  None  MDM  UA Wet prep & GC  Cbc  Assessment and Plan   A:  1. Retained tampon, initial encounter   2. LLQ abdominal pain   3. Abnormal vaginal bleeding  4. BV (bacterial vaginosis)   5. Constipation, unspecified constipation type     P:  Discharge home in stable condition Rx: Flagyl- no alcohol Return to MAU if symptoms worsen Return if develop fever Avoid tampons for a few weeks Colace as directed on the bottle   Noni Saupe I, NP 01/15/2017 7:57 PM

## 2017-01-15 NOTE — Discharge Instructions (Signed)
Colace over the counter     Constipation, Adult Constipation is when a person:  Poops (has a bowel movement) fewer times in a week than normal.  Has a hard time pooping.  Has poop that is dry, hard, or bigger than normal.  Follow these instructions at home: Eating and drinking   Eat foods that have a lot of fiber, such as: ? Fresh fruits and vegetables. ? Whole grains. ? Beans.  Eat less of foods that are high in fat, low in fiber, or overly processed, such as: ? [place] fries. ? Hamburgers. ? Cookies. ? Candy. ? Soda.  Drink enough fluid to keep your pee (urine) clear or pale yellow. General instructions  Exercise regularly or as told by your doctor.  Go to the restroom when you feel like you need to poop. Do not hold it in.  Take over-the-counter and prescription medicines only as told by your doctor. These include any fiber supplements.  Do pelvic floor retraining exercises, such as: ? Doing deep breathing while relaxing your lower belly (abdomen). ? Relaxing your pelvic floor while pooping.  Watch your condition for any changes.  Keep all follow-up visits as told by your doctor. This is important. Contact a doctor if:  You have pain that gets worse.  You have a fever.  You have not pooped for 4 days.  You throw up (vomit).  You are not hungry.  You lose weight.  You are bleeding from the anus.  You have thin, pencil-like poop (stool). Get help right away if:  You have a fever, and your symptoms suddenly get worse.  You leak poop or have blood in your poop.  Your belly feels hard or bigger than normal (is bloated).  You have very bad belly pain.  You feel dizzy or you faint. This information is not intended to replace advice given to you by your health care provider. Make sure you discuss any questions you have with your health care provider. Document Released: 09/29/2007 Document Revised: 10/31/2015 Document Reviewed: 10/01/2015 Elsevier  Interactive Patient Education  2017 [employer].

## 2017-01-17 ENCOUNTER — Other Ambulatory Visit: Payer: Self-pay | Admitting: Advanced Practice Midwife

## 2017-01-17 ENCOUNTER — Telehealth (HOSPITAL_COMMUNITY): Payer: Self-pay

## 2017-01-17 DIAGNOSIS — N760 Acute vaginitis: Principal | ICD-10-CM

## 2017-01-17 DIAGNOSIS — B9689 Other specified bacterial agents as the cause of diseases classified elsewhere: Secondary | ICD-10-CM

## 2017-01-17 LAB — GC/CHLAMYDIA PROBE AMP (~~LOC~~) NOT AT ARMC
CHLAMYDIA, DNA PROBE: NEGATIVE
NEISSERIA GONORRHEA: NEGATIVE

## 2017-01-17 MED ORDER — METRONIDAZOLE 500 MG PO TABS: 500.0000 mg | ORAL_TABLET | Freq: Two times a day (BID) | ORAL | 0 refills | Status: AC

## 2017-01-17 NOTE — Progress Notes (Signed)
Flagyl not sent to pharmacy on last visit  Order sent to [facility], [place], [place]

## 2017-01-17 NOTE — Telephone Encounter (Signed)
Pt called nurse call line. Gave them her name and DOB. Spoke to nurse call line and they informed me that pt called asking about her rx that was supposed to be sent to pharmacy on the 9/22. While lookin in her chart, the note said that there was going to be Flagyl sent to pharmacy, but upon looking at meds, there was none sent. Spoke with Hansel Feinstein, CNM, MAU APP today, she was going to send the rx over to pt's pharmacy.

## 2017-01-22 MED ORDER — FLUCONAZOLE 150 MG PO TABS: 150.0000 mg | ORAL_TABLET | Freq: Once | ORAL | 1 refills | Status: AC

## 2017-01-25 ENCOUNTER — Other Ambulatory Visit (HOSPITAL_COMMUNITY): Payer: Medicaid Other

## 2017-01-25 ENCOUNTER — Ambulatory Visit (INDEPENDENT_AMBULATORY_CARE_PROVIDER_SITE_OTHER): Payer: Medicaid Other

## 2017-01-25 DIAGNOSIS — R0789 Other chest pain: Secondary | ICD-10-CM | POA: Diagnosis not present

## 2017-01-25 DIAGNOSIS — R0602 Shortness of breath: Secondary | ICD-10-CM

## 2017-01-25 LAB — EXERCISE TOLERANCE TEST
CHL CUP RESTING HR STRESS: 69 {beats}/min
CHL RATE OF PERCEIVED EXERTION: 17
CSEPED: 7 min
CSEPEDS: 0 s
CSEPEW: 8.7 METS
MPHR: 181 {beats}/min
Peak HR: 179 {beats}/min
Percent HR: 98 %

## 2017-01-28 ENCOUNTER — Telehealth: Payer: Self-pay | Admitting: *Deleted

## 2017-01-28 NOTE — Telephone Encounter (Signed)
Pt left a very static, hard to understand message on triage line.  I think I heard her say she is having a headache and tightness in jaw, but cant be sure.  Attempted to call the number provided twice.  No answer and VM is full.  Fleeger, Salome Spotted, CMA

## 2017-01-31 ENCOUNTER — Ambulatory Visit (HOSPITAL_COMMUNITY): Payer: Medicaid Other | Attending: Internal Medicine

## 2017-01-31 ENCOUNTER — Other Ambulatory Visit: Payer: Self-pay

## 2017-01-31 DIAGNOSIS — R0602 Shortness of breath: Secondary | ICD-10-CM | POA: Diagnosis not present

## 2017-01-31 DIAGNOSIS — R0789 Other chest pain: Secondary | ICD-10-CM | POA: Insufficient documentation

## 2017-01-31 DIAGNOSIS — R0600 Dyspnea, unspecified: Secondary | ICD-10-CM | POA: Diagnosis not present

## 2017-02-08 ENCOUNTER — Ambulatory Visit (HOSPITAL_COMMUNITY)
Admission: RE | Admit: 2017-02-08 | Discharge: 2017-02-08 | Disposition: A | Discharge status: Home / Self Care | Payer: Medicaid Other | Source: Ambulatory Visit | Attending: Family Medicine | Admitting: Family Medicine

## 2017-02-08 DIAGNOSIS — G4452 New daily persistent headache (NDPH): Secondary | ICD-10-CM | POA: Insufficient documentation

## 2017-02-08 MED ORDER — GADOBENATE DIMEGLUMINE 529 MG/ML IV SOLN
20.0000 mL | Freq: Once | INTRAVENOUS | Status: AC | PRN
  Administered 2017-02-08: 20 mL via INTRAVENOUS

## 2017-06-10 ENCOUNTER — Encounter (HOSPITAL_COMMUNITY): Payer: Self-pay | Admitting: Family Medicine

## 2017-06-10 ENCOUNTER — Ambulatory Visit (HOSPITAL_COMMUNITY)
Admission: EM | Admit: 2017-06-10 | Discharge: 2017-06-10 | Disposition: A | Discharge status: Home / Self Care | Payer: Medicaid Other | Attending: Family Medicine | Admitting: Family Medicine

## 2017-06-10 DIAGNOSIS — M659 Synovitis and tenosynovitis, unspecified: Secondary | ICD-10-CM

## 2017-06-10 MED ORDER — DICLOFENAC SODIUM 75 MG PO TBEC: 75.0000 mg | DELAYED_RELEASE_TABLET | Freq: Two times a day (BID) | ORAL | 0 refills | Status: DC

## 2017-06-10 NOTE — ED Provider Notes (Signed)
[facility]   [id number] 06/10/17 Arrival Time: 1347   SUBJECTIVE:  Latoya Guzman is a 42 y.o. female who presents to the urgent care with complaint of right leg pain. Reports that 2 days ago she was at work and twisted her foot then felt a pop and warm sensation up her leg into thought. sts also some numbness in her toes. sts also some left hand weakness and grip strength issues that have been intermittent this week.   Past Medical History:  Diagnosis Date  . Anemia 12/19/2012  . Antiphospholipid antibody positive   . Anxiety   . Blood transfusion 1998; 2015   Post C/S; related to anemia  . Chronic bronchitis ([place])   . Depression    Was on Lexapro Stopped when pregnant  . Fibroid 03/2010   Noted on [place] in Dec.  . Headache    "weekly" (01/01/2015)  . Lupus Lupus Antigen    associated with pregnancy   . 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 Pica    toilet tissue  . Preterm labor    PTL last two pregnancies  . Sickle cell trait ([place])   . Tricuspid valve regurgitation 02/27/2010   Family History  Problem Relation Age of Onset  . Hypertension Mother   . Lupus Sister   . Diabetes Maternal Uncle   . Cancer Maternal Grandmother   . Colon cancer Neg Hx    Social History   Socioeconomic History  . Marital status: Single    Spouse name: Not on file  . Number of children: Not on file  . Years of education: Not on file  . Highest education level: Not on file  Social Needs  . Financial resource strain: Not on file  . Food insecurity - worry: Not on file  . Food insecurity - inability: Not on file  . Transportation needs - medical: Not on file  . Transportation needs - non-medical: Not on file  Occupational History  . Not on file  Tobacco Use  . Smoking status: Never Smoker  . Smokeless tobacco: Never Used  Substance and Sexual Activity  . Alcohol use: Yes    Comment: 01/01/2015 "might have a couple  drinks/year"  . Drug use: No  . Sexual activity: Not Currently    Birth control/protection: Surgical  Other Topics Concern  . Not on file  Social History Narrative  . Not on file   No outpatient medications have been marked as taking for the 06/10/17 encounter [facility] Encounter).   No Known Allergies    ROS: As per HPI, remainder of ROS negative.   OBJECTIVE:   Vitals:   06/10/17 1500 06/10/17 1501  BP:  111/79  Pulse: 72   Resp: 18   Temp:  98.6 F (37 C)  SpO2: 100%      General appearance: alert; no distress Eyes: PERRL; EOMI; conjunctiva normal HENT: normocephalic; atraumatic; TMs normal, canal normal, external ears normal without trauma; nasal mucosa normal; oral mucosa normal Neck: supple Back: no CVA tenderness Extremities: no cyanosis or edema; symmetrical with no gross deformities ; mild swelling right great toe MTP joint Skin: warm and dry Neurologic: normal gait; grossly normal Psychological: alert and cooperative; normal mood and affect      Labs:  Results for orders placed or performed in visit on 01/25/17  Exercise Tolerance Test  Result Value Ref Range   Rest HR 69 bpm   Rest  BP 110/67 mmHg   RPE 17    Exercise duration (sec) 0 sec   Percent HR 98 %   Exercise duration (min) 7 min   Estimated workload 8.7 METS   Peak HR 179 bpm   Peak BP 172/62 mmHg   MPHR 181 bpm    Labs Reviewed - No data to display  No results found.     ASSESSMENT & PLAN:  1. Tenosynovitis of foot     Meds ordered this encounter  Medications  . diclofenac (VOLTAREN) 75 MG EC tablet    Sig: Take 1 tablet (75 mg total) by mouth 2 (two) times daily.    Dispense:  14 tablet    Refill:  0    Reviewed expectations re: course of current medical issues. Questions answered. Outlined signs and symptoms indicating need for more acute intervention. Patient verbalized understanding. After Visit Summary given.    Procedures:      Robyn Haber,  MD 06/10/17 1534

## 2017-06-10 NOTE — ED Triage Notes (Signed)
Pt here for right leg pain. Reports that 2 days ago she was at work and twisted her foot then felt a pop and warm sensation up her leg into thought. sts also some numbness in her toes. sts also some left hand weakness and grip strength issues that have been intermittent this week.

## 2017-06-13 ENCOUNTER — Encounter: Payer: Self-pay | Admitting: Internal Medicine

## 2017-06-13 ENCOUNTER — Ambulatory Visit (INDEPENDENT_AMBULATORY_CARE_PROVIDER_SITE_OTHER): Payer: Medicaid Other | Admitting: Internal Medicine

## 2017-06-13 ENCOUNTER — Other Ambulatory Visit: Payer: Self-pay

## 2017-06-13 DIAGNOSIS — G4452 New daily persistent headache (NDPH): Secondary | ICD-10-CM

## 2017-06-13 MED ORDER — SUMATRIPTAN SUCCINATE 50 MG PO TABS: 50.0000 mg | ORAL_TABLET | ORAL | 0 refills | Status: DC | PRN

## 2017-06-13 NOTE — Progress Notes (Signed)
   [facility] Phone: [phone]   Date of Visit: 06/13/2017   HPI:  Headaches:  - reports of headache son the right temporal region and right occipital region that is throbbing and achy. Reports this started about two weeks ago but has never fully resolved. Naproxen does help but does not fully resolve the pain.  Tried naproxen 500 mg once - she has associated photophobia and nausea without emesis  - her headache history started in October, but prior to this she has not had regular headaches.  -Denies having aura -No focal weakness or numbness -At last clinic visit MRI  brain completed which was unremarkable  ROS: See HPI.  [place]:  Depression, headache  PHYSICAL EXAM: BP 100/70   Pulse 67   Temp 98.6 F (37 C) (Oral)   Wt 241 lb (109.3 kg)   LMP 05/28/2017   SpO2 99%   BMI 33.61 kg/m  GEN: NAD HEENT: Atraumatic, normocephalic, neck supple, EOMI, PERRL, sclera clear  CV: RRR, no murmurs, rubs, or gallops PULM: CTAB, normal effort ABD: Soft, nontender, nondistended, NABS, no organomegaly SKIN: No rash or cyanosis; warm and well-perfused EXTR: No lower extremity edema or calf tenderness PSYCH: Mood and affect euthymic, normal rate and volume of speech NEURO: Awake, alert, CN 2-12 normal, normal strength in upper and lower extremities bilaterally, normal sensation to light touch in upper and lower extremities bilaterally.  Normal patellar reflexes bilaterally.  Normal gait.  Normal speech  ASSESSMENT/PLAN:   Headache Headaches possibly more consistent with migraine etiology.  Will do trial of sumatriptan as needed.  No contraindications to this medication for patient.  Patient to follow-up if symptoms do not improve with medication.  Medication works but patient continues to have frequent episodes of headaches we discussed possible prophylactic medication as well.   Smiley Houseman, MD PGY [place]

## 2017-06-13 NOTE — Patient Instructions (Signed)
Let's try Sumatriptan for your headaches.  You can only take up to 2 tablets in a day.

## 2017-06-15 ENCOUNTER — Encounter: Payer: Self-pay | Admitting: Internal Medicine

## 2017-06-15 NOTE — Assessment & Plan Note (Addendum)
Headaches possibly more consistent with migraine etiology.  Will do trial of sumatriptan as needed.  No contraindications to this medication for patient.  Patient to follow-up if symptoms do not improve with medication.  Medication works but patient continues to have frequent episodes of headaches we discussed possible prophylactic medication as well.

## 2017-10-06 ENCOUNTER — Emergency Department (HOSPITAL_COMMUNITY): Payer: Medicaid Other

## 2017-10-06 ENCOUNTER — Encounter (HOSPITAL_COMMUNITY): Payer: Self-pay | Admitting: Emergency Medicine

## 2017-10-06 ENCOUNTER — Emergency Department (HOSPITAL_COMMUNITY)
Admission: EM | Admit: 2017-10-06 | Discharge: 2017-10-06 | Disposition: A | Discharge status: Home / Self Care | Payer: Medicaid Other | Attending: Emergency Medicine | Admitting: Emergency Medicine

## 2017-10-06 ENCOUNTER — Other Ambulatory Visit: Payer: Self-pay

## 2017-10-06 DIAGNOSIS — Y92410 Unspecified street and highway as the place of occurrence of the external cause: Secondary | ICD-10-CM | POA: Diagnosis not present

## 2017-10-06 DIAGNOSIS — S298XXA Other specified injuries of thorax, initial encounter: Secondary | ICD-10-CM | POA: Diagnosis not present

## 2017-10-06 DIAGNOSIS — S86912A Strain of unspecified muscle(s) and tendon(s) at lower leg level, left leg, initial encounter: Secondary | ICD-10-CM

## 2017-10-06 DIAGNOSIS — S3991XA Unspecified injury of abdomen, initial encounter: Secondary | ICD-10-CM

## 2017-10-06 DIAGNOSIS — Y999 Unspecified external cause status: Secondary | ICD-10-CM | POA: Diagnosis not present

## 2017-10-06 DIAGNOSIS — Y939 Activity, unspecified: Secondary | ICD-10-CM | POA: Insufficient documentation

## 2017-10-06 DIAGNOSIS — S93601A Unspecified sprain of right foot, initial encounter: Secondary | ICD-10-CM | POA: Diagnosis not present

## 2017-10-06 DIAGNOSIS — S0990XA Unspecified injury of head, initial encounter: Secondary | ICD-10-CM | POA: Diagnosis present

## 2017-10-06 DIAGNOSIS — S161XXA Strain of muscle, fascia and tendon at neck level, initial encounter: Secondary | ICD-10-CM | POA: Diagnosis not present

## 2017-10-06 DIAGNOSIS — D649 Anemia, unspecified: Secondary | ICD-10-CM | POA: Insufficient documentation

## 2017-10-06 DIAGNOSIS — S060X1A Concussion with loss of consciousness of 30 minutes or less, initial encounter: Secondary | ICD-10-CM

## 2017-10-06 LAB — BASIC METABOLIC PANEL
Anion gap: 9 (ref 5–15)
BUN: 6 mg/dL (ref 6–20)
CHLORIDE: 107 mmol/L (ref 101–111)
CO2: 25 mmol/L (ref 22–32)
CREATININE: 0.92 mg/dL (ref 0.44–1.00)
Calcium: 9.1 mg/dL (ref 8.9–10.3)
Glucose, Bld: 108 mg/dL — ABNORMAL HIGH (ref 65–99)
Potassium: 4.2 mmol/L (ref 3.5–5.1)
Sodium: 141 mmol/L (ref 135–145)

## 2017-10-06 LAB — CBC
HCT: 25.2 % — ABNORMAL LOW (ref 36.0–46.0)
HEMOGLOBIN: 7.5 g/dL — AB (ref 12.0–15.0)
MCH: 17.2 pg — ABNORMAL LOW (ref 26.0–34.0)
MCHC: 29.8 g/dL — AB (ref 30.0–36.0)
MCV: 57.8 fL — ABNORMAL LOW (ref 78.0–100.0)
PLATELETS: 300 10*3/uL (ref 150–400)
RBC: 4.36 MIL/uL (ref 3.87–5.11)
RDW: 18.3 % — ABNORMAL HIGH (ref 11.5–15.5)
WBC: 6.3 10*3/uL (ref 4.0–10.5)

## 2017-10-06 LAB — POC URINE PREG, ED: PREG TEST UR: NEGATIVE

## 2017-10-06 LAB — TYPE AND SCREEN
ABO/RH(D): B POS
ANTIBODY SCREEN: NEGATIVE

## 2017-10-06 MED ORDER — FERROUS SULFATE 325 (65 FE) MG PO TABS: 325.0000 mg | ORAL_TABLET | Freq: Every day | ORAL | 0 refills | Status: DC

## 2017-10-06 MED ORDER — FENTANYL CITRATE (PF) 100 MCG/2ML IJ SOLN
50.0000 ug | Freq: Once | INTRAMUSCULAR | Status: AC
  Administered 2017-10-06: 50 ug via INTRAVENOUS

## 2017-10-06 MED ORDER — FENTANYL CITRATE (PF) 100 MCG/2ML IJ SOLN: 50.0000 ug | Freq: Once | INTRAMUSCULAR | Status: DC

## 2017-10-06 MED ORDER — FENTANYL CITRATE (PF) 100 MCG/2ML IJ SOLN
INTRAMUSCULAR | Status: DC
Start: 2017-10-06 — End: 2017-10-06
  Filled 2017-10-06: qty 2

## 2017-10-06 MED ORDER — IOHEXOL 300 MG/ML  SOLN
80.0000 mL | Freq: Once | INTRAMUSCULAR | Status: AC
  Administered 2017-10-06: 80 mL via INTRAVENOUS

## 2017-10-06 NOTE — ED Notes (Signed)
Patient transported to CT 
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
## 2017-10-06 NOTE — ED Notes (Signed)
Patient transported to X-ray 
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
## 2017-10-06 NOTE — ED Notes (Signed)
ED Provider at bedside. 

## 2017-10-06 NOTE — ED Notes (Signed)
Hyperventilating cautioned to slow her respirations only speeding up.

## 2017-10-06 NOTE — ED Notes (Signed)
Returns from CT 

## 2017-10-17 ENCOUNTER — Ambulatory Visit (INDEPENDENT_AMBULATORY_CARE_PROVIDER_SITE_OTHER): Payer: Self-pay | Admitting: Family Medicine

## 2017-10-17 ENCOUNTER — Encounter: Payer: Self-pay | Admitting: Family Medicine

## 2017-10-17 ENCOUNTER — Other Ambulatory Visit: Payer: Self-pay

## 2017-10-17 DIAGNOSIS — D649 Anemia, unspecified: Secondary | ICD-10-CM

## 2017-10-17 MED ORDER — CYCLOBENZAPRINE HCL 5 MG PO TABS: 5.0000 mg | ORAL_TABLET | Freq: Three times a day (TID) | ORAL | 0 refills | Status: DC | PRN

## 2017-10-17 MED ORDER — FERROUS SULFATE 325 (65 FE) MG PO TABS
325.0000 mg | ORAL_TABLET | Freq: Two times a day (BID) | ORAL | 2 refills | Status: DC
Start: 2017-10-17 — End: 2019-07-10
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
## 2017-10-18 ENCOUNTER — Telehealth (HOSPITAL_BASED_OUTPATIENT_CLINIC_OR_DEPARTMENT_OTHER): Payer: Self-pay | Admitting: Family Medicine

## 2017-10-18 LAB — IRON AND TIBC
Iron Saturation: 5 % — CL (ref 15–55)
Iron: 22 ug/dL — ABNORMAL LOW (ref 27–159)
TIBC: 407 ug/dL (ref 250–450)
UIBC: 385 ug/dL (ref 131–425)

## 2017-10-18 LAB — CBC
Hematocrit: 28.1 % — ABNORMAL LOW (ref 34.0–46.6)
Hemoglobin: 8.3 g/dL — ABNORMAL LOW (ref 11.1–15.9)
MCH: 17.5 pg — ABNORMAL LOW (ref 26.6–33.0)
MCHC: 29.5 g/dL — ABNORMAL LOW (ref 31.5–35.7)
MCV: 59 fL — ABNORMAL LOW (ref 79–97)
Platelets: 290 10*3/uL (ref 150–450)
RBC: 4.74 x10E6/uL (ref 3.77–5.28)
RDW: 20.7 % — ABNORMAL HIGH (ref 12.3–15.4)
WBC: 4.3 10*3/uL (ref 3.4–10.8)

## 2017-10-18 NOTE — Telephone Encounter (Signed)
Called patient to discuss lab results. Hgb still  low but improved to 8.3. Iron saturation very low at 5%. Discussed continuing to take the BID iron pill. Anemia likely related to heavy menses as well. She will follow up with new PCP in about 1 month. Earlier return precautions discussed. She was appreciative of the call.   Smitty Cords, MD [place], PGY-3

## 2017-11-04 ENCOUNTER — Ambulatory Visit (INDEPENDENT_AMBULATORY_CARE_PROVIDER_SITE_OTHER): Payer: Medicaid Other | Admitting: Family Medicine

## 2017-11-04 ENCOUNTER — Other Ambulatory Visit (HOSPITAL_COMMUNITY)
Admission: RE | Admit: 2017-11-04 | Discharge: 2017-11-04 | Disposition: A | Discharge status: Home / Self Care | Payer: Medicaid Other | Source: Ambulatory Visit | Attending: Family Medicine | Admitting: Family Medicine

## 2017-11-04 ENCOUNTER — Encounter: Payer: Self-pay | Admitting: Family Medicine

## 2017-11-04 ENCOUNTER — Other Ambulatory Visit: Payer: Self-pay

## 2017-11-04 VITALS — BP 100/60 | HR 73 | Temp 98.3°F | Ht 71.0 in | Wt 239.6 lb

## 2017-11-04 DIAGNOSIS — N760 Acute vaginitis: Secondary | ICD-10-CM | POA: Diagnosis not present

## 2017-11-04 DIAGNOSIS — Z0000 Encounter for general adult medical examination without abnormal findings: Secondary | ICD-10-CM | POA: Diagnosis present

## 2017-11-04 DIAGNOSIS — B9689 Other specified bacterial agents as the cause of diseases classified elsewhere: Secondary | ICD-10-CM

## 2017-11-04 DIAGNOSIS — Z113 Encounter for screening for infections with a predominantly sexual mode of transmission: Secondary | ICD-10-CM | POA: Diagnosis not present

## 2017-11-04 DIAGNOSIS — F0781 Postconcussional syndrome: Secondary | ICD-10-CM

## 2017-11-04 DIAGNOSIS — Z0001 Encounter for general adult medical examination with abnormal findings: Secondary | ICD-10-CM | POA: Diagnosis not present

## 2017-11-04 DIAGNOSIS — N898 Other specified noninflammatory disorders of vagina: Secondary | ICD-10-CM | POA: Diagnosis not present

## 2017-11-04 DIAGNOSIS — A5901 Trichomonal vulvovaginitis: Secondary | ICD-10-CM | POA: Insufficient documentation

## 2017-11-04 LAB — POCT WET PREP (WET MOUNT): Clue Cells Wet Prep Whiff POC: POSITIVE

## 2017-11-04 MED ORDER — METRONIDAZOLE 500 MG PO TABS: 500.0000 mg | ORAL_TABLET | Freq: Two times a day (BID) | ORAL | 0 refills | Status: AC
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
## 2017-11-04 NOTE — Progress Notes (Signed)
.  pap

## 2017-11-05 DIAGNOSIS — F0781 Postconcussional syndrome: Secondary | ICD-10-CM | POA: Insufficient documentation

## 2017-11-05 DIAGNOSIS — N898 Other specified noninflammatory disorders of vagina: Secondary | ICD-10-CM | POA: Insufficient documentation

## 2017-11-05 HISTORY — DX: Postconcussional syndrome: F07.81

## 2017-11-05 LAB — RPR: RPR Ser Ql: NONREACTIVE

## 2017-11-05 LAB — HIV ANTIBODY (ROUTINE TESTING W REFLEX): HIV SCREEN 4TH GENERATION: NONREACTIVE

## 2017-11-05 NOTE — Assessment & Plan Note (Signed)
Treating with metronidazole in setting of coinfection with trichmonas

## 2017-11-05 NOTE — Assessment & Plan Note (Signed)
   Sent metronidazole pharmacy to treat call infection with bacterial vaginosis.  Counseled patient on importance of partner treatment, as reinfection is possible with re-exposure.
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
## 2017-11-07 ENCOUNTER — Encounter: Payer: Self-pay | Admitting: Family Medicine

## 2017-11-07 LAB — CERVICOVAGINAL ANCILLARY ONLY
Chlamydia: NEGATIVE
Neisseria Gonorrhea: NEGATIVE

## 2017-11-07 LAB — CYTOLOGY - PAP: Diagnosis: NEGATIVE

## 2017-11-15 ENCOUNTER — Ambulatory Visit: Payer: Medicaid Other | Admitting: Family Medicine

## 2017-12-29 ENCOUNTER — Other Ambulatory Visit: Payer: Self-pay

## 2017-12-29 ENCOUNTER — Ambulatory Visit: Payer: Medicaid Other | Admitting: Family Medicine

## 2017-12-29 DIAGNOSIS — Z711 Person with feared health complaint in whom no diagnosis is made: Secondary | ICD-10-CM

## 2017-12-30 NOTE — Progress Notes (Signed)
Patient presented for same day appt and left prior to being seen. Appt marked canceled.

## 2018-01-11 ENCOUNTER — Encounter: Payer: Self-pay | Admitting: Family Medicine

## 2018-01-11 ENCOUNTER — Ambulatory Visit (INDEPENDENT_AMBULATORY_CARE_PROVIDER_SITE_OTHER): Payer: Medicaid Other | Admitting: Family Medicine

## 2018-01-11 ENCOUNTER — Other Ambulatory Visit: Payer: Self-pay

## 2018-01-11 DIAGNOSIS — B373 Candidiasis of vulva and vagina: Secondary | ICD-10-CM | POA: Diagnosis not present

## 2018-01-11 DIAGNOSIS — B3731 Acute candidiasis of vulva and vagina: Secondary | ICD-10-CM

## 2018-01-11 MED ORDER — CYCLOBENZAPRINE HCL 10 MG PO TABS: 10.0000 mg | ORAL_TABLET | Freq: Three times a day (TID) | ORAL | 0 refills | Status: DC | PRN

## 2018-01-11 MED ORDER — FLUCONAZOLE 150 MG PO TABS: 150.0000 mg | ORAL_TABLET | Freq: Once | ORAL | 0 refills | Status: AC
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
## 2018-01-11 NOTE — Patient Instructions (Signed)
It was great to meet you today! Thank you for letting me participate in your care!  Today, we discussed your mid low back pain. I will get x-rays and call you if the results are abnormal. Please begin taking Flexeril at night 10mg  daily. Please also take Tylenol 500mg  x 2 and Alleve 200mg  x2 every 8 hours or three times per day. Please return to the clinic if your back pain is not improved after finishing this medication. I have also included some stretches for your sciatica. Please do them 5 times per week for 3-4 weeks for the best results.  I have also given you a prescription for your yeast infection.  Be well, Harolyn Rutherford, DO PGY-2, [facility]

## 2018-01-16 ENCOUNTER — Telehealth: Payer: Self-pay | Admitting: Family Medicine

## 2018-01-16 ENCOUNTER — Ambulatory Visit (HOSPITAL_COMMUNITY)
Admission: RE | Admit: 2018-01-16 | Discharge: 2018-01-16 | Disposition: A | Discharge status: Home / Self Care | Payer: Medicaid Other | Source: Ambulatory Visit | Attending: Family Medicine | Admitting: Family Medicine

## 2018-01-16 DIAGNOSIS — M545 Low back pain: Secondary | ICD-10-CM | POA: Insufficient documentation

## 2018-01-16 NOTE — Assessment & Plan Note (Addendum)
Patient returns and states she has had little relief. Her physical exam continues to be reassuring however given persistence of symptoms and spinous process tenderness to palpation I will obtain an x-ray of her lumbar spine today. Instructed patient to take Tyenlon 500mg  x 2 and Alleve 400mg  every 8 hours for the next 7 days and gave her a prescription for Flexeril 10mg .   If x-rays are negative I would consider Amitriptyline 10mg  dose for 6 weeks to see if she has any improvement. Given her history of depression she may be susceptible to having back pain but this should not be attributed to depression until physical causes have been ruled out. X-ray should help [place] in this regard.

## 2018-01-16 NOTE — Telephone Encounter (Signed)
Pt called to check on the status of her getting an Xray done. She was told to go to the hospital to have a scan/xray of her back on Friday. She was told she could not have this done until they received an order. She would like to know if this could be placed and for someone to call her when it has been done.

## 2018-01-16 NOTE — Telephone Encounter (Signed)
LM for patient ok per DPR that xray order is in Epic and she can go to hospital at her convenience to get this performed.  Jazmin Hartsell,CMA

## 2018-01-16 NOTE — Assessment & Plan Note (Signed)
Most likely from taking antibiotic for Trichomonas treatment. 150mg  of Diflucan oral one time dose. Return instruction given if no improvement.

## 2018-01-20 ENCOUNTER — Encounter: Payer: Self-pay | Admitting: Family Medicine

## 2018-01-20 NOTE — Progress Notes (Signed)
Normal lumbar x-ray. Sending letter to patient informing her of normal results.

## 2018-03-28 ENCOUNTER — Encounter (HOSPITAL_COMMUNITY): Payer: Self-pay | Admitting: Emergency Medicine

## 2018-03-28 ENCOUNTER — Emergency Department (HOSPITAL_COMMUNITY)
Admission: EM | Admit: 2018-03-28 | Discharge: 2018-03-28 | Disposition: A | Discharge status: Home / Self Care | Payer: Medicaid Other | Attending: Emergency Medicine | Admitting: Emergency Medicine

## 2018-03-28 DIAGNOSIS — R109 Unspecified abdominal pain: Secondary | ICD-10-CM

## 2018-03-28 DIAGNOSIS — R1012 Left upper quadrant pain: Secondary | ICD-10-CM | POA: Insufficient documentation

## 2018-03-28 DIAGNOSIS — Z79899 Other long term (current) drug therapy: Secondary | ICD-10-CM | POA: Insufficient documentation

## 2018-03-28 DIAGNOSIS — D649 Anemia, unspecified: Secondary | ICD-10-CM | POA: Diagnosis not present

## 2018-03-28 LAB — URINALYSIS, ROUTINE W REFLEX MICROSCOPIC
Bilirubin Urine: NEGATIVE
Glucose, UA: NEGATIVE mg/dL
Hgb urine dipstick: NEGATIVE
KETONES UR: NEGATIVE mg/dL
Leukocytes, UA: NEGATIVE
Nitrite: NEGATIVE
Protein, ur: NEGATIVE mg/dL
Specific Gravity, Urine: 1.026 (ref 1.005–1.030)
pH: 5 (ref 5.0–8.0)

## 2018-03-28 LAB — CBC
HCT: 30.9 % — ABNORMAL LOW (ref 36.0–46.0)
Hemoglobin: 8.8 g/dL — ABNORMAL LOW (ref 12.0–15.0)
MCH: 17.9 pg — ABNORMAL LOW (ref 26.0–34.0)
MCHC: 28.5 g/dL — ABNORMAL LOW (ref 30.0–36.0)
MCV: 62.9 fL — ABNORMAL LOW (ref 80.0–100.0)
Platelets: 331 10*3/uL (ref 150–400)
RBC: 4.91 MIL/uL (ref 3.87–5.11)
RDW: 19.8 % — ABNORMAL HIGH (ref 11.5–15.5)
WBC: 6.5 10*3/uL (ref 4.0–10.5)
nRBC: 0 % (ref 0.0–0.2)

## 2018-03-28 LAB — COMPREHENSIVE METABOLIC PANEL
ALK PHOS: 72 U/L (ref 38–126)
ALT: 39 U/L (ref 0–44)
AST: 36 U/L (ref 15–41)
Albumin: 4.1 g/dL (ref 3.5–5.0)
Anion gap: 8 (ref 5–15)
BUN: 12 mg/dL (ref 6–20)
CO2: 23 mmol/L (ref 22–32)
Calcium: 9.1 mg/dL (ref 8.9–10.3)
Chloride: 105 mmol/L (ref 98–111)
Creatinine, Ser: 1.01 mg/dL — ABNORMAL HIGH (ref 0.44–1.00)
GFR calc Af Amer: >60 mL/min (ref >60)
GFR calc non Af Amer: >60 mL/min (ref >60)
Glucose, Bld: 92 mg/dL (ref 70–99)
Potassium: 4 mmol/L (ref 3.5–5.1)
Sodium: 136 mmol/L (ref 135–145)
Total Bilirubin: 0.2 mg/dL — ABNORMAL LOW (ref 0.3–1.2)
Total Protein: 7.9 g/dL (ref 6.5–8.1)

## 2018-03-28 LAB — I-STAT BETA HCG BLOOD, ED (MC, WL, AP ONLY): I-stat hCG, quantitative: <5 m[IU]/mL (ref <5)

## 2018-03-28 LAB — LIPASE, BLOOD: Lipase: 36 U/L (ref 11–51)

## 2018-03-28 MED ORDER — ONDANSETRON HCL 4 MG/2ML IJ SOLN
4.0000 mg | Freq: Once | INTRAMUSCULAR | Status: AC
  Administered 2018-03-28: 4 mg via INTRAVENOUS
  Filled 2018-03-28: qty 2

## 2018-03-28 MED ORDER — FAMOTIDINE 20 MG PO TABS: 20.0000 mg | ORAL_TABLET | Freq: Two times a day (BID) | ORAL | 0 refills | Status: DC

## 2018-03-28 MED ORDER — SODIUM CHLORIDE 0.9 % IV BOLUS
1000.0000 mL | Freq: Once | INTRAVENOUS | Status: AC
  Administered 2018-03-28: 1000 mL via INTRAVENOUS

## 2018-03-28 MED ORDER — ONDANSETRON HCL 4 MG PO TABS: 4.0000 mg | ORAL_TABLET | Freq: Four times a day (QID) | ORAL | 0 refills | Status: DC

## 2018-03-28 NOTE — Discharge Instructions (Addendum)
Tests were good.  Prescription for stomach medicine and nausea medicine.  Follow-up with your primary care doctor.

## 2018-03-28 NOTE — ED Notes (Signed)
Patient verbalizes understanding of discharge instructions. Opportunity for questioning and answers were provided. Armband removed by staff, pt discharged from ED. Pt ambulatory to lobby. Prescriptions reviewed.  

## 2018-03-28 NOTE — ED Notes (Signed)
ED Provider at bedside. 

## 2018-03-28 NOTE — ED Triage Notes (Signed)
Pt reports eating "bad chicken" yesterday and has since had 3 episodes of vomiting. Reports a burning sensation in her ULQ that she has had for a couple weeks as well.
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
## 2018-03-29 ENCOUNTER — Ambulatory Visit: Payer: Medicaid Other

## 2018-07-10 ENCOUNTER — Encounter: Payer: Self-pay | Admitting: Student in an Organized Health Care Education/Training Program

## 2018-07-10 ENCOUNTER — Ambulatory Visit (INDEPENDENT_AMBULATORY_CARE_PROVIDER_SITE_OTHER): Payer: Self-pay | Admitting: Student in an Organized Health Care Education/Training Program

## 2018-07-10 ENCOUNTER — Other Ambulatory Visit: Payer: Self-pay

## 2018-07-10 VITALS — BP 118/80 | HR 83 | Temp 98.3°F | Wt 258.0 lb

## 2018-07-10 DIAGNOSIS — R760 Raised antibody titer: Secondary | ICD-10-CM

## 2018-07-10 DIAGNOSIS — D179 Benign lipomatous neoplasm, unspecified: Secondary | ICD-10-CM

## 2018-07-10 DIAGNOSIS — D6861 Antiphospholipid syndrome: Secondary | ICD-10-CM

## 2018-07-10 NOTE — Patient Instructions (Signed)
It was a pleasure seeing you today in our clinic.  Here is the treatment plan we have discussed and agreed upon together:  We drew blood work at today's visit. I will call or send you a letter with these results. If you do not hear from me within the next week, please give our office a call.  A consult was placed to orthopedic surgery at today's visit.  You will receive a call to schedule an appointment. If you do not receive a call within two weeks please call our office so we can place the consult again.  Our clinic's number is [phone]. Please call with questions or concerns about what we discussed today.  Be well, Dr. Burr Medico

## 2018-07-10 NOTE — Assessment & Plan Note (Addendum)
Patient endorses multiple vague symptoms and expresses concern about possible lupus.  Symptoms include intermittent bilateral feet swelling, intermittent face swelling, and easy bruising.  She is not endorsing arthralgias or rash. - Sedimentation rate - ANA - CBC With Differential/Platelet -Patient to follow-up with PCP for further work-up and management

## 2018-07-10 NOTE — Progress Notes (Signed)
CC:   HPI: Latoya Guzman is a 43 y.o. female with PMH: Past Medical History:  Diagnosis Date  . Anemia 12/19/2012  . Antiphospholipid antibody positive   . Anxiety   . Blood transfusion 1998; 2015   Post C/S; related to anemia  . Chronic bronchitis ([place])   . Depression    Was on Lexapro Stopped when pregnant  . Fibroid 03/2010   Noted on [place] in Dec.  . Headache    "weekly" (01/01/2015)  . Lupus (HCC) Lupus Antigen    associated with pregnancy   . 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 Pica    toilet tissue  . Preterm labor    PTL last two pregnancies  . Sickle cell trait ([place])   . Tricuspid valve regurgitation 02/27/2010     Shin nodules Oral months duration.  Noted over anterior right shin.  2 discrete nodules.  Deep to the skin.  Now tender to palpation over the last couple of weeks.  No night sweats fevers or weight loss.  In fact patient notes weight gain.  Intermittent Face swelling/ easy bruising/bilateral feet swelling Patient endorses intermittent bilateral temporal swelling that is worse after she eats a meal.  Additionally she has noted easy bruising on her legs.  She has intermittent bilateral foot swelling.  She endorses a family history of lupus in her sister.  She reports that she has another family member with rheumatoid arthritis.  The patient was previously diagnosed with antiphospholipid antibody syndrome.  She has had multiple miscarriages.  She had a low positive anticardiolipin antibody screen of 27 in the past.  She is concerned about possible lupus.  She is not chronically anticoagulated, and reports that she has been told in the past that she does not require chronic anticoagulation.  Review of Symptoms:  See HPI for ROS.   CC, SH/smoking status, and VS noted.  Objective: BP 118/80   Pulse 83   Temp 98.3 F (36.8 C) (Oral)   Wt 117 kg   SpO2 99%   BMI 35.98 kg/m  GEN: NAD, alert,  cooperative, and pleasant. EYE: no conjunctival injection, pupils equally round and reactive to light ENMT: normal tympanic light reflex, no nasal polyps,no rhinorrhea, no pharyngeal erythema or exudates NECK: full ROM, no thyromegaly RESPIRATORY: clear to auscultation bilaterally with no wheezes, rhonchi or rales, good effort CV: RRR, no m/r/g, no peripheral edema GI: soft, non-tender, non-distended, no hepatosplenomegaly SKIN: warm and dry, two minimally raised round  lesions appreciable over right lower shin~5cm in diameter, ttp, no surrounding erythema or irritation NEURO: II-XII grossly intact, normal gait, peripheral sensation intact PSYCH: AAOx3, appropriate affect  Assessment and plan:  Antiphospholipid antibody positive Patient endorses multiple vague symptoms and expresses concern about possible lupus.  Symptoms include intermittent bilateral feet swelling, intermittent face swelling, and easy bruising.  She is not endorsing arthralgias or rash. - Sedimentation rate - ANA - CBC With Differential/Platelet -Patient to follow-up with PCP for further work-up and management   Lipoma Multiple deep tender nodules.  No evidence of infection or surrounding erythema or irritation.  The nodules are difficult to see unless you are looking at her legs from a certain angle.  These may represent lipomas.  More concerning possible etiology would be a sarcoma in which case patient would require an MRI.  Are bothersome to the patient and she is interested in potentially having these removed.  Will refer  to orthopedic surgery for further work-up and management.  Orders Placed This Encounter  Procedures  . Sedimentation rate  . ANA  . CBC With Differential/Platelet  . Ambulatory referral to Orthopedic Surgery    Referral Priority:   Routine    Referral Type:   Surgical    Referral Reason:   Specialty Services Required    Requested Specialty:   Orthopedic Surgery    Number of Visits Requested:    1   Everrett Coombe, MD,MS,  PGY3 07/10/2018 12:33 PM

## 2018-07-10 NOTE — Assessment & Plan Note (Signed)
Multiple deep tender nodules.  No evidence of infection or surrounding erythema or irritation.  The nodules are difficult to see unless you are looking at her legs from a certain angle.  These may represent lipomas.  More concerning possible etiology would be a sarcoma in which case patient would require an MRI.  Are bothersome to the patient and she is interested in potentially having these removed.  Will refer to orthopedic surgery for further work-up and management.

## 2018-07-12 LAB — CBC WITH DIFFERENTIAL/PLATELET
Basophils Absolute: 0 10*3/uL (ref 0.0–0.2)
Basos: 0 %
EOS (ABSOLUTE): 0.1 10*3/uL (ref 0.0–0.4)
Eos: 1 %
Hematocrit: 31.4 % — ABNORMAL LOW (ref 34.0–46.6)
Hemoglobin: 9.2 g/dL — ABNORMAL LOW (ref 11.1–15.9)
Immature Grans (Abs): 0 10*3/uL (ref 0.0–0.1)
Immature Granulocytes: 1 %
LYMPHS ABS: 1.2 10*3/uL (ref 0.7–3.1)
Lymphs: 26 %
MCH: 21 pg — ABNORMAL LOW (ref 26.6–33.0)
MCHC: 29.3 g/dL — ABNORMAL LOW (ref 31.5–35.7)
MCV: 72 fL — ABNORMAL LOW (ref 79–97)
Monocytes Absolute: 0.3 10*3/uL (ref 0.1–0.9)
Monocytes: 6 %
NEUTROS ABS: 2.9 10*3/uL (ref 1.4–7.0)
Neutrophils: 66 %
Platelets: 279 10*3/uL (ref 150–450)
RBC: 4.39 x10E6/uL (ref 3.77–5.28)
RDW: 20.8 % — ABNORMAL HIGH (ref 11.7–15.4)
WBC: 4.5 10*3/uL (ref 3.4–10.8)

## 2018-07-12 LAB — SEDIMENTATION RATE: Sed Rate: 24 mm/hr (ref 0–32)

## 2018-07-12 LAB — ANA: Anti Nuclear Antibody(ANA): NEGATIVE

## 2018-07-12 LAB — SPECIMEN STATUS REPORT

## 2018-07-17 ENCOUNTER — Other Ambulatory Visit: Payer: Self-pay

## 2018-07-17 ENCOUNTER — Ambulatory Visit (INDEPENDENT_AMBULATORY_CARE_PROVIDER_SITE_OTHER): Payer: Self-pay

## 2018-07-17 ENCOUNTER — Other Ambulatory Visit (INDEPENDENT_AMBULATORY_CARE_PROVIDER_SITE_OTHER): Payer: Self-pay | Admitting: *Deleted

## 2018-07-17 ENCOUNTER — Ambulatory Visit (INDEPENDENT_AMBULATORY_CARE_PROVIDER_SITE_OTHER): Payer: Self-pay | Admitting: Orthopaedic Surgery

## 2018-07-17 ENCOUNTER — Encounter (INDEPENDENT_AMBULATORY_CARE_PROVIDER_SITE_OTHER): Payer: Self-pay | Admitting: Orthopaedic Surgery

## 2018-07-17 ENCOUNTER — Encounter: Payer: Self-pay | Admitting: Student in an Organized Health Care Education/Training Program

## 2018-07-17 VITALS — BP 118/74 | HR 79 | Resp 14 | Ht 71.0 in | Wt 259.0 lb

## 2018-07-17 DIAGNOSIS — D1723 Benign lipomatous neoplasm of skin and subcutaneous tissue of right leg: Secondary | ICD-10-CM

## 2018-07-17 DIAGNOSIS — R2241 Localized swelling, mass and lump, right lower limb: Secondary | ICD-10-CM
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
## 2018-08-01 ENCOUNTER — Telehealth (INDEPENDENT_AMBULATORY_CARE_PROVIDER_SITE_OTHER): Payer: Self-pay | Admitting: Orthopaedic Surgery

## 2018-08-01 ENCOUNTER — Other Ambulatory Visit (INDEPENDENT_AMBULATORY_CARE_PROVIDER_SITE_OTHER): Payer: Self-pay | Admitting: Orthopaedic Surgery

## 2018-08-01 DIAGNOSIS — D1723 Benign lipomatous neoplasm of skin and subcutaneous tissue of right leg: Secondary | ICD-10-CM

## 2018-08-01 DIAGNOSIS — R2241 Localized swelling, mass and lump, right lower limb: Secondary | ICD-10-CM

## 2018-08-01 NOTE — Telephone Encounter (Signed)
Are you okay with this?

## 2018-08-01 NOTE — Telephone Encounter (Signed)
Sarah from [facility] called stating the referral from Dr. Durward Fortes needs to state "with and without contrast" due to having a mass.  If you have any questions, please call back at [phone]

## 2018-08-01 NOTE — Telephone Encounter (Signed)
Order has been reentered and corrected.

## 2018-08-01 NOTE — Telephone Encounter (Signed)
yes

## 2018-08-10 ENCOUNTER — Ambulatory Visit (INDEPENDENT_AMBULATORY_CARE_PROVIDER_SITE_OTHER): Payer: Self-pay | Admitting: Family Medicine

## 2018-08-10 ENCOUNTER — Other Ambulatory Visit: Payer: Self-pay

## 2018-08-10 VITALS — BP 120/66 | HR 88 | Temp 98.6°F | Wt 254.4 lb

## 2018-08-10 DIAGNOSIS — G43009 Migraine without aura, not intractable, without status migrainosus: Secondary | ICD-10-CM | POA: Insufficient documentation

## 2018-08-10 MED ORDER — SUMATRIPTAN SUCCINATE 50 MG PO TABS: 50.0000 mg | ORAL_TABLET | ORAL | 0 refills | Status: DC | PRN

## 2018-08-10 MED ORDER — NAPROXEN 500 MG PO TABS: 500.0000 mg | ORAL_TABLET | Freq: Two times a day (BID) | ORAL | 0 refills | Status: DC
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
## 2018-08-10 NOTE — Patient Instructions (Signed)

## 2018-08-16 ENCOUNTER — Other Ambulatory Visit: Payer: Self-pay | Admitting: Orthopaedic Surgery

## 2018-08-17 ENCOUNTER — Other Ambulatory Visit: Payer: Self-pay

## 2018-08-17 ENCOUNTER — Ambulatory Visit
Admission: RE | Admit: 2018-08-17 | Discharge: 2018-08-17 | Disposition: A | Discharge status: Home / Self Care | Payer: Medicaid Other | Source: Ambulatory Visit | Attending: Orthopaedic Surgery | Admitting: Orthopaedic Surgery

## 2018-08-17 DIAGNOSIS — R2241 Localized swelling, mass and lump, right lower limb: Secondary | ICD-10-CM

## 2018-08-17 DIAGNOSIS — D1723 Benign lipomatous neoplasm of skin and subcutaneous tissue of right leg: Secondary | ICD-10-CM

## 2018-08-17 MED ORDER — GADOBENATE DIMEGLUMINE 529 MG/ML IV SOLN
20.0000 mL | Freq: Once | INTRAVENOUS | Status: AC | PRN
  Administered 2018-08-17: 20 mL via INTRAVENOUS

## 2018-08-25 ENCOUNTER — Telehealth: Payer: Self-pay | Admitting: Family Medicine

## 2018-08-25 MED ORDER — DIPHENHYDRAMINE HCL 25 MG PO TABS: 25.0000 mg | ORAL_TABLET | Freq: Four times a day (QID) | ORAL | 0 refills | Status: DC | PRN

## 2018-08-25 MED ORDER — CETIRIZINE HCL 10 MG PO TABS: 10.0000 mg | ORAL_TABLET | Freq: Every day | ORAL | 0 refills | Status: DC

## 2018-08-25 NOTE — Telephone Encounter (Signed)
FPTS After Hours Note  Latoya Guzman called the after-hours line to report that she recently took sumatriptan 1 hour ago for a migraine headache, which is a new medication prescribed to her.  She feels like this medication made her headache worse.  She says she also has swelling of the right side of her face and some swelling of her tongue, but she says this started before taking sumatriptan and occurs after she eats.  She also reports "jerking of my right eye" recently.  She denies any shortness of breath or swelling in her throat.  On chart review, it appears that patient has reported chronic temporal swelling that occurs after she eats.  This is unlikely to be an anaphylactic reaction to sumatriptan since her symptoms have been occurring before taking this medication and occurred today prior to taking it.  However, I am concerned that her tongue swelling could progress to possibly life-threatening anaphylaxis.  I relayed this to the patient and encouraged her to go to the emergency department if her swelling worsens at all.  I also sent Benadryl and Zyrtec to her pharmacy for her to take for this and any future episodes of swelling.  I also recommended that she go to urgent care to be evaluated in person tomorrow if her symptoms are not severe enough for her to go to the emergency department.  The patient expressed understanding to this plan and was in agreement.  I will forward this to her PCP as an FYI.  Oliviya Gilkison C. Shan Levans, MD PGY-2, [facility] 08/25/2018 6:18 PM

## 2018-08-30 ENCOUNTER — Ambulatory Visit (INDEPENDENT_AMBULATORY_CARE_PROVIDER_SITE_OTHER): Payer: Medicaid Other | Admitting: Family Medicine

## 2018-08-30 ENCOUNTER — Encounter: Payer: Self-pay | Admitting: Family Medicine

## 2018-08-30 ENCOUNTER — Other Ambulatory Visit: Payer: Self-pay

## 2018-08-30 VITALS — BP 120/75 | HR 64 | Wt 256.0 lb

## 2018-08-30 DIAGNOSIS — R51 Headache: Secondary | ICD-10-CM | POA: Diagnosis not present

## 2018-08-30 DIAGNOSIS — G8929 Other chronic pain: Secondary | ICD-10-CM

## 2018-08-30 MED ORDER — DIHYDROERGOTAMINE MESYLATE 4 MG/ML NA SOLN: 1.0000 | NASAL | 12 refills | Status: DC | PRN

## 2018-08-30 NOTE — Assessment & Plan Note (Signed)
Chronic headache.  Differential includes abdominal migraine, cluster, postconcussive, rebound headache.  Patient difficult to manage due to minimal improvement on NSAIDs, Tylenol.  Recent possible allergic reaction to sumatriptan, although patient is taking it in the past without issue.  Will trial direct drug ergotamine for abortive therapy.  Referral to neurology for further work-up.  Head imaging 1 year ago normal, negative ESR within past 2 months.  Low suspicion for GCA.

## 2018-08-30 NOTE — Patient Instructions (Addendum)
It was a pleasure to see you today! Thank you for choosing [facility] for your primary care. Latoya Guzman was seen for allergic reaction and ongoing headache.   Please stop taking sumatriptan. We are prescribing you dihydroergotamine instead.   You may continue to use naproxen and tylenol as needed for headache as well.   We are referring you to neurology.   Best,  Marny Lowenstein, MD, MS FAMILY MEDICINE RESIDENT - PGY2 08/30/2018 11:52 AM
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
## 2018-09-04 NOTE — Progress Notes (Signed)
Error

## 2018-09-05 ENCOUNTER — Encounter: Payer: Self-pay | Admitting: Neurology

## 2018-09-05 ENCOUNTER — Telehealth: Payer: Medicaid Other | Admitting: Neurology

## 2018-09-05 ENCOUNTER — Other Ambulatory Visit: Payer: Self-pay

## 2018-09-15 NOTE — Progress Notes (Signed)
NEUROLOGY CONSULTATION NOTE  Latoya Guzman MRN: [id number] DOB: 02-20-1976  Referring provider: Orson Eva, DO Primary care provider: Orson Eva, DO  Reason for consult:  headache  HISTORY OF PRESENT ILLNESS: Latoya Guzman is a 43 year old woman who presents for headaches.  History supplemented by referring provider note.  Onset:  Since 2018.  Soon after, she had a concussion that made them worse.   Location:  Bifrontal/bi-temporal/occipital Quality:  Pulling sensation Intensity:  10/10.  She denies new headache, thunderclap headache Aura:  no Premonitory Phase:  no Postdrome:  no Associated symptoms:  Nausea, vomiting, photophobia, phonophobia, osmophobia, blurred vision/black spots, right eye abducts, right temple swells, sometimes right facial numbness.  She denies associated unilateral numbness or weakness. Duration:  1 hour with Migranal Frequency: 4 times a day Frequency of abortive medication: something daily (ibuprofen, naproxen, Migranal) Triggers:  Chewing cereal Relieving factors:  Warm compress, quiet Activity:  aggravates  Workup: MRI of brain with and without contrast from 02/08/17 personally reviewed and was normal. Sed Rate from 07/10/18 was 24. Has not had an eye exam  For several years, she reports numbness of the right arm when she wakes up in the morning.  It involves all of her fingers.  She had a NCV-EMG in 2017 which was normal.  She does have some neck pain.  Current NSAIDS: Voltaren, naproxen, ibuprofen Current analgesics:  none Current triptans:  none Current ergotamine:  Migranal Current anti-emetic:  none Current muscle relaxants:  Flexeril Current anti-anxiolytic:  none Current sleep aide:  none Current Antihypertensive medications:  none Current Antidepressant medications:  none Current Anticonvulsant medications:  none Current anti-CGRP:  none Current Vitamins/Herbal/Supplements:  none Current Antihistamines/Decongestants:  Benadryl  Other therapy:  none Hormone/birth control:  none  Past NSAIDS:  none Past analgesics:  none Past abortive triptans:  Sumatriptan (facial/tongue swelling, trouble breathing) Past abortive ergotamine:  none Past muscle relaxants:  none Past anti-emetic:  none Past antihypertensive medications:  none Past antidepressant medications:  none Past anticonvulsant medications:  none Past anti-CGRP:  none Past vitamins/Herbal/Supplements:  none Past antihistamines/decongestant:  none Other past therapies:  none  Caffeine:  Stopped coffee/specialty drinks Diet:  hydrates Exercise:  Not since quarantine Depression:  no; Anxiety:  no Other pain:  Leg pain Sleep hygiene:  poor Family history of headache:  no   PAST MEDICAL HISTORY: Past Medical History:  Diagnosis Date  . Anemia 12/19/2012  . Antiphospholipid antibody positive   . Anxiety   . Blood transfusion 1998; 2015   Post C/S; related to anemia  . Chronic bronchitis ([place])   . Depression    Was on Lexapro Stopped when pregnant  . Fibroid 03/2010   Noted on [place] in Dec.  . Headache    "weekly" (01/01/2015)  . Lupus (HCC) Lupus Antigen    associated with pregnancy   . 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 Pica    toilet tissue  . Preterm labor    PTL last two pregnancies  . Sickle cell trait ([place])   . Tricuspid valve regurgitation 02/27/2010    PAST SURGICAL HISTORY: Past Surgical History:  Procedure Laterality Date  . [place], 2009  . CESAREAN SECTION  11/14/2010   Procedure: CESAREAN SECTION;  Surgeon: Juliene Pina C. Hulan Fray, MD;  Location: [facility];  Service: Gynecology;  Laterality: N/A;  Repeat cesarean section with delivery of baby boy at 44. Apgars 9/9. Bilateral  tubal ligation with filshie clips.   Marland Kitchen DILATION AND CURETTAGE OF UTERUS  2005, 2006   1st for twin loss, 2nd for retained placenta  . ESOPHAGOGASTRODUODENOSCOPY N/A 01/02/2015   Procedure:  ESOPHAGOGASTRODUODENOSCOPY (EGD);  Surgeon: Manus Gunning, MD;  Location: [place];  Service: Gastroenterology;  Laterality: N/A;  . TUBAL LIGATION  11/14/2010    MEDICATIONS: Current Outpatient Medications on File Prior to Visit  Medication Sig Dispense Refill  . cetirizine (ZYRTEC) 10 MG tablet Take 1 tablet (10 mg total) by mouth daily. 30 tablet 0  . cyclobenzaprine (FLEXERIL) 10 MG tablet Take 1 tablet (10 mg total) by mouth 3 (three) times daily as needed for muscle spasms. (Patient not taking: Reported on 03/28/2018) 30 tablet 0  . diclofenac (VOLTAREN) 75 MG EC tablet Take 1 tablet (75 mg total) by mouth 2 (two) times daily. (Patient not taking: Reported on 03/28/2018) 14 tablet 0  . dihydroergotamine (MIGRANAL) 4 MG/ML nasal spray Place 1 spray into the nose as needed for migraine. Use in one nostril as directed.  No more than 4 sprays in one hour 8 mL 12  . diphenhydrAMINE (BENADRYL) 25 MG tablet Take 1 tablet (25 mg total) by mouth every 6 (six) hours as needed. 30 tablet 0  . famotidine (PEPCID) 20 MG tablet Take 1 tablet (20 mg total) by mouth 2 (two) times daily. (Patient not taking: Reported on 03/28/2018) 180 tablet 3  . ferrous sulfate 325 (65 FE) MG tablet Take 1 tablet (325 mg total) by mouth 2 (two) times daily with a meal. 60 tablet 2  . ibuprofen (ADVIL,MOTRIN) 600 MG tablet Take 1 tablet (600 mg total) by mouth every 6 (six) hours as needed for moderate pain. 21 tablet 0  . naproxen (NAPROSYN) 500 MG tablet Take 1 tablet (500 mg total) by mouth 2 (two) times daily with a meal. 30 tablet 0   No current facility-administered medications on file prior to visit.     ALLERGIES: Allergies  Allergen Reactions  . Sumatriptan Shortness Of Breath, Swelling and Palpitations    FAMILY HISTORY: Family History  Problem Relation Age of Onset  . Hypertension Mother   . Lupus Sister   . Diabetes Maternal Uncle   . Cancer Maternal Grandmother   . Colon cancer Neg Hx      SOCIAL HISTORY: Social History   Socioeconomic History  . Marital status: Single    Spouse name: Not on file  . Number of children: 7  . Years of education: Not on file  . Highest education level: GED or equivalent  Occupational History  . Occupation: unemployed  Social Needs  . Financial resource strain: Not on file  . Food insecurity:    Worry: Not on file    Inability: Not on file  . Transportation needs:    Medical: Not on file    Non-medical: Not on file  Tobacco Use  . Smoking status: Never Smoker  . Smokeless tobacco: Never Used  Substance and Sexual Activity  . Alcohol use: Yes    Comment: 01/01/2015 "might have a couple drinks/year"  . Drug use: No  . Sexual activity: Not Currently    Birth control/protection: Surgical  Lifestyle  . Physical activity:    Days per week: Not on file    Minutes per session: Not on file  . Stress: Not on file  Relationships  . Social connections:    Talks on phone: Not on file    Gets together: Not on  file    Attends religious service: Not on file    Active member of club or organization: Not on file    Attends meetings of clubs or organizations: Not on file    Relationship status: Not on file  . Intimate partner violence:    Fear of current or ex partner: Not on file    Emotionally abused: Not on file    Physically abused: Not on file    Forced sexual activity: Not on file  Other Topics Concern  . Not on file  Social History Narrative   Patient is left-handed. She lives with her children in a 2nd floor apartment. She drinks one coffee a day. She does not exercise.    REVIEW OF SYSTEMS: Constitutional: No fevers, chills, or sweats, no generalized fatigue, change in appetite Eyes: No visual changes, double vision, eye pain Ear, nose and throat: No hearing loss, ear pain, nasal congestion, sore throat Cardiovascular: No chest pain, palpitations Respiratory:  No shortness of breath at rest or with exertion, wheezes  GastrointestinaI: No nausea, vomiting, diarrhea, abdominal pain, fecal incontinence Genitourinary:  No dysuria, urinary retention or frequency Musculoskeletal:  No neck pain, back pain Integumentary: No rash, pruritus, skin lesions Neurological: as above Psychiatric: No depression, insomnia, anxiety Endocrine: No palpitations, fatigue, diaphoresis, mood swings, change in appetite, change in weight, increased thirst Hematologic/Lymphatic:  No purpura, petechiae. Allergic/Immunologic: no itchy/runny eyes, nasal congestion, recent allergic reactions, rashes  PHYSICAL EXAM: Blood pressure 125/79, pulse 70, temperature 98 F (36.7 C), height 5\' 11"  (1.803 m), weight 258 lb (117 kg), last menstrual period 08/30/2018, SpO2 97 %, unknown if currently breastfeeding.  General: No acute distress.  Patient appears well-groomed.  Head:  Normocephalic/atraumatic Eyes:  fundi examined but not visualized Neck: supple, no paraspinal tenderness, full range of motion Back: No paraspinal tenderness Heart: regular rate and rhythm Lungs: Clear to auscultation bilaterally. Vascular: No carotid bruits. Neurological Exam: Mental status: alert and oriented to person, place, and time, recent and remote memory intact, fund of knowledge intact, attention and concentration intact, speech fluent and not dysarthric, language intact. Cranial nerves: CN I: not tested CN II: pupils equal, round and reactive to light, visual fields intact CN III, IV, VI:  full range of motion, no nystagmus, no ptosis CN V: facial sensation intact CN VII: upper and lower face symmetric CN VIII: hearing intact CN IX, X: gag intact, uvula midline CN XI: sternocleidomastoid and trapezius muscles intact CN XII: tongue midline Bulk & Tone: normal, no fasciculations. Motor:  5/5 throughout  Sensation: pinprick and vibration sensation intact. Deep Tendon Reflexes:  2+ throughout, toes downgoing. Finger to nose testing:  Without  dysmetria.  Heel to shin:  Without dysmetria.  Gait:  Normal station and stride.  Able to turn and tandem walk. Romberg negative.  IMPRESSION: 1. Chronic migraine without aura, without status migrainosus, not intractable 2.  Right arm numbness and tingling.    PLAN: 1.  For preventative management, start topiramate 25mg  at bedtime for 1 week, then increase to 50mg  at bedtime.  We can increase dose to 100mg  at bedtime in 5 weeks if needed 2.  For abortive therapy, Migranal 3.  Limit use of pain relievers to no more than 2 days out of week to prevent risk of rebound or medication-overuse headache. 4.  Keep headache diary 5.  Exercise, hydration, caffeine cessation, sleep hygiene, monitor for and avoid triggers 6.  Consider:  magnesium citrate 400mg  daily, riboflavin 400mg  daily, and coenzyme Q10 100mg   three times daily 7. Always keep in mind that currently taking a hormone or birth control may be a possible trigger or aggravating factor for migraine. 8. NCV-EMG of right upper extremity 9. Referral to eye doctor 10. Follow up in 4 months.  Thank you for allowing me to take part in the care of this patient.  Metta Clines, DO  CC:  Orson Eva, DO

## 2018-09-19 ENCOUNTER — Encounter: Payer: Self-pay | Admitting: Neurology

## 2018-09-19 ENCOUNTER — Ambulatory Visit (INDEPENDENT_AMBULATORY_CARE_PROVIDER_SITE_OTHER): Payer: Medicaid Other | Admitting: Neurology

## 2018-09-19 VITALS — BP 125/79 | HR 70 | Temp 98.0°F | Ht 71.0 in | Wt 258.0 lb

## 2018-09-19 DIAGNOSIS — R202 Paresthesia of skin: Secondary | ICD-10-CM

## 2018-09-19 DIAGNOSIS — G43009 Migraine without aura, not intractable, without status migrainosus: Secondary | ICD-10-CM

## 2018-09-19 DIAGNOSIS — R200 Anesthesia of skin: Secondary | ICD-10-CM

## 2018-09-19 MED ORDER — TOPIRAMATE 50 MG PO TABS: 50.0000 mg | ORAL_TABLET | Freq: Every day | ORAL | 3 refills | Status: DC

## 2018-09-19 NOTE — Addendum Note (Signed)
Addended by: Clois Comber on: 09/19/2018 01:04 PM   Modules accepted: Orders

## 2018-09-19 NOTE — Patient Instructions (Signed)
1.  Start topiramate 50mg  tablet.  Take 1/2 tablet at bedtime for one week, then 1 tablet at bedtime.  Contact me in four weeks if you think we need to increase dose. 2.  Use Migranal for migraine attacks. 3.  Limit use of pain relievers to no more than 2 days out of week to prevent risk of rebound or medication-overuse headache. 4.  Keep headache diary 5.  We will order another nerve study of right arm. 6.  Follow up in 4 months.

## 2018-10-11 DIAGNOSIS — H52223 Regular astigmatism, bilateral: Secondary | ICD-10-CM | POA: Diagnosis not present

## 2018-10-11 DIAGNOSIS — H5213 Myopia, bilateral: Secondary | ICD-10-CM | POA: Diagnosis not present

## 2018-10-26 DIAGNOSIS — H5213 Myopia, bilateral: Secondary | ICD-10-CM | POA: Diagnosis not present

## 2018-12-19 ENCOUNTER — Other Ambulatory Visit: Payer: Self-pay

## 2018-12-19 ENCOUNTER — Telehealth: Payer: Medicaid Other | Admitting: Family Medicine

## 2018-12-19 DIAGNOSIS — R109 Unspecified abdominal pain: Secondary | ICD-10-CM

## 2018-12-19 NOTE — Progress Notes (Signed)
Patient scheduled for a telemedicine visit due to stomach concerns/vomiting.  Called phone number provided by patient for visit, which was her friend's phone number (that she was not currently with) due to the patient's phone being broken (currently being fixed).  Unable to reach patient without any phone and did not show up at the office for an appointment (monitored until 4:45 PM).  Please reschedule patient if she calls in for an appointment.  Patriciaann Clan, DO

## 2019-01-18 NOTE — Progress Notes (Signed)
Virtual Visit via Video Note The purpose of this virtual visit is to provide medical care while limiting exposure to the novel coronavirus.    Consent was obtained for video visit:  Yes.   Answered questions that patient had about telehealth interaction:  Yes.   I discussed the limitations, risks, security and privacy concerns of performing an evaluation and management service by telemedicine. I also discussed with the patient that there may be a patient responsible charge related to this service. The patient expressed understanding and agreed to proceed.  Pt location: Home Physician Location: office Name of referring provider:  Bufford Lope, DO I connected with Latoya Guzman at patients initiation/request on 01/22/2019 at  8:50 AM EDT by video enabled telemedicine application and verified that I am speaking with the correct person using two identifiers. Pt MRN:  [id number] Pt DOB:  1975-05-15 Video Participants:  Latoya Guzman;   History of Present Illness:  Latoya Guzman is a 43 year old woman who follows up for migraines.  UPDATE: Migraines had stopped but they returned 3 weeks ago: Intensity:  10/10 Duration:  1 hour with Migranal but will later return Frequency:  4 days a week She reports some mild memory issues but it has been worse while she has a migraine.  Cannot identify this trigger  Current NSAIDS: Voltaren, naproxen, ibuprofen Current analgesics:  none Current triptans:  none Current ergotamine:  Migranal Current anti-emetic:  none Current muscle relaxants:  Flexeril Current anti-anxiolytic:  none Current sleep aide:  none Current Antihypertensive medications:  none Current Antidepressant medications:  none Current Anticonvulsant medications:  topiramate 50mg  at bedtime Current anti-CGRP:  none Current Vitamins/Herbal/Supplements:  ferrous sulfate Current Antihistamines/Decongestants:  Benadryl, Zyrtecf Other therapy:  none Hormone/birth control:  none   To evaluate right arm numbness and tingling, a NCV-EMG was ordered but never performed.  Caffeine:  Stopped coffee/specialty drinks Diet:  hydrates Exercise:  Not since quarantine Depression:  no; Anxiety:  no Other pain:  Leg pain Sleep hygiene:  poor  HISTORY: Onset:  Since 2018.  Soon after, she had a concussion that made them worse.   Location:  Bifrontal/bi-temporal/occipital Quality:  Pulling sensation Initial intensity:  10/10.  She denies new headache, thunderclap headache Aura:  no Premonitory Phase:  no Postdrome:  no Associated symptoms:  Nausea, vomiting, photophobia, phonophobia, osmophobia, blurred vision/black spots, right eye abducts, right temple swells, sometimes right facial numbness.  She denies associated unilateral numbness or weakness. Initial Duration:  1 hour with Migranal Initial Frequency: 4 times a day Initial Frequency of abortive medication: something daily (ibuprofen, naproxen, Migranal) Triggers:  Chewing cereal, hot/spicy foods, change in weather Relieving factors:  Warm compress, quiet Activity:  aggravates  Workup: MRI of brain with and without contrast from 02/08/17 personally reviewed and was normal. Sed Rate from 07/10/18 was 24. Has not had an eye exam  For several years, she reports numbness of the right arm when she wakes up in the morning.  It involves all of her fingers.  She had a NCV-EMG in 2017 which was normal.  She does have some neck pain.  Past NSAIDS:  none Past analgesics:  none Past abortive triptans:  Sumatriptan (facial/tongue swelling, trouble breathing) Past abortive ergotamine:  none Past muscle relaxants:  none Past anti-emetic:  none Past antihypertensive medications:  none Past antidepressant medications:  none Past anticonvulsant medications:  none Past anti-CGRP:  none Past vitamins/Herbal/Supplements:  none Past antihistamines/decongestant:  none Other past therapies:  none  Family history of  headache:  no   Past Medical History: Past Medical History:  Diagnosis Date  . Anemia 12/19/2012  . Antiphospholipid antibody positive   . Anxiety   . Blood transfusion 1998; 2015   Post C/S; related to anemia  . Chronic bronchitis ([place])   . Depression    Was on Lexapro Stopped when pregnant  . Fibroid 03/2010   Noted on [place] in Dec.  . Headache    "weekly" (01/01/2015)  . Lupus ([facility]) Lupus Antigen    associated with pregnancy   . 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 Pica    toilet tissue  . Preterm labor    PTL last two pregnancies  . Sickle cell trait ([place])   . Tricuspid valve regurgitation 02/27/2010    Medications: Outpatient Encounter Medications as of 01/22/2019  Medication Sig  . cetirizine (ZYRTEC) 10 MG tablet Take 1 tablet (10 mg total) by mouth daily.  . cyclobenzaprine (FLEXERIL) 10 MG tablet Take 1 tablet (10 mg total) by mouth 3 (three) times daily as needed for muscle spasms.  . diclofenac (VOLTAREN) 75 MG EC tablet Take 1 tablet (75 mg total) by mouth 2 (two) times daily.  Marland Kitchen dihydroergotamine (MIGRANAL) 4 MG/ML nasal spray Place 1 spray into the nose as needed for migraine. Use in one nostril as directed.  No more than 4 sprays in one hour  . diphenhydrAMINE (BENADRYL) 25 MG tablet Take 1 tablet (25 mg total) by mouth every 6 (six) hours as needed.  . famotidine (PEPCID) 20 MG tablet Take 1 tablet (20 mg total) by mouth 2 (two) times daily.  . ferrous sulfate 325 (65 FE) MG tablet Take 1 tablet (325 mg total) by mouth 2 (two) times daily with a meal.  . ibuprofen (ADVIL,MOTRIN) 600 MG tablet Take 1 tablet (600 mg total) by mouth every 6 (six) hours as needed for moderate pain.  . naproxen (NAPROSYN) 500 MG tablet Take 1 tablet (500 mg total) by mouth 2 (two) times daily with a meal.  . topiramate (TOPAMAX) 50 MG tablet Take 1 tablet (50 mg total) by mouth at bedtime.   No facility-administered  encounter medications on file as of 01/22/2019.     Allergies: Allergies  Allergen Reactions  . Sumatriptan Shortness Of Breath, Swelling and Palpitations    Family History: Family History  Problem Relation Age of Onset  . Hypertension Mother   . Lupus Sister   . Diabetes Maternal Uncle   . Cancer Maternal Grandmother   . Colon cancer Neg Hx     Social History: Social History   Socioeconomic History  . Marital status: Single    Spouse name: Not on file  . Number of children: 7  . Years of education: Not on file  . Highest education level: GED or equivalent  Occupational History  . Occupation: unemployed  Social Needs  . Financial resource strain: Not on file  . Food insecurity    Worry: Not on file    Inability: Not on file  . Transportation needs    Medical: Not on file    Non-medical: Not on file  Tobacco Use  . Smoking status: Never Smoker  . Smokeless tobacco: Never Used  Substance and Sexual Activity  . Alcohol use: Yes    Comment: 01/01/2015 "might have a couple drinks/year"  . Drug use: No  . Sexual activity: Not Currently    Birth control/protection:  Surgical  Lifestyle  . Physical activity    Days per week: Not on file    Minutes per session: Not on file  . Stress: Not on file  Relationships  . Social Herbalist on phone: Not on file    Gets together: Not on file    Attends religious service: Not on file    Active member of club or organization: Not on file    Attends meetings of clubs or organizations: Not on file    Relationship status: Not on file  . Intimate partner violence    Fear of current or ex partner: Not on file    Emotionally abused: Not on file    Physically abused: Not on file    Forced sexual activity: Not on file  Other Topics Concern  . Not on file  Social History Narrative   Patient is left-handed. She lives with her children in a 2nd floor apartment. She drinks one coffee a day. She does not exercise.     Observations/Objective:   Height 5\' 11"  (1.803 m), weight 258 lb (117 kg), unknown if currently breastfeeding. No acute distress.  Alert and oriented.  Speech fluent and not dysarthric.  Language intact.  Eyes orthophoric on primary gaze.  Face symmetric.  Assessment and Plan:   Migraine without aura, without status migrainosus, not intractable Memory issues.  May be residual from poncussion but now amplified due to topiramate and migraine.  1.  For preventative management, stop topiramate due to memory issues and start nortriptyline 10mg  at bedtime.  She may contact me in 4 weeks if headaches not improved and we can increase dose to 25mg  at bedtime 2.  For abortive therapy, Migranal 3.  Limit use of pain relievers to no more than 2 days out of week to prevent risk of rebound or medication-overuse headache. 4.  Keep headache diary 5.  Exercise, hydration, caffeine cessation, sleep hygiene, monitor for and avoid triggers 6.  Consider:  magnesium citrate 400mg  daily, riboflavin 400mg  daily, and coenzyme Q10 100mg  three times daily 7. Always keep in mind that currently taking a hormone or birth control may be a possible trigger or aggravating factor for migraine. 8. Follow up 4 months.   Follow Up Instructions:    -I discussed the assessment and treatment plan with the patient. The patient was provided an opportunity to ask questions and all were answered. The patient agreed with the plan and demonstrated an understanding of the instructions.   The patient was advised to call back or seek an in-person evaluation if the symptoms worsen or if the condition fails to improve as anticipated.    Dudley Major, DO

## 2019-01-19 ENCOUNTER — Encounter: Payer: Self-pay | Admitting: Neurology

## 2019-01-22 ENCOUNTER — Encounter: Payer: Self-pay | Admitting: Neurology

## 2019-01-22 ENCOUNTER — Other Ambulatory Visit: Payer: Self-pay

## 2019-01-22 ENCOUNTER — Telehealth (INDEPENDENT_AMBULATORY_CARE_PROVIDER_SITE_OTHER): Payer: Medicaid Other | Admitting: Neurology

## 2019-01-22 VITALS — Ht 71.0 in | Wt 258.0 lb

## 2019-01-22 DIAGNOSIS — G43009 Migraine without aura, not intractable, without status migrainosus: Secondary | ICD-10-CM

## 2019-01-22 MED ORDER — NORTRIPTYLINE HCL 10 MG PO CAPS: 10.0000 mg | ORAL_CAPSULE | Freq: Every day | ORAL | 3 refills | Status: DC

## 2019-01-22 NOTE — Patient Instructions (Signed)
1.  Stop topiramate.  Instead, start nortriptyline 10mg  at bedtime.  If headaches not improved in 4 weeks, contact me and I can increase dose. 2.  Take Migranal spray earliest onset of migraine.  May repeat in 15 minutes if needed. 3.  Limit use of pain relievers to no more than 2 days out of week to prevent risk of rebound or medication-overuse headache. 4.  Follow up in 4 months.

## 2019-01-23 ENCOUNTER — Emergency Department (HOSPITAL_COMMUNITY): Payer: Medicaid Other

## 2019-01-23 ENCOUNTER — Other Ambulatory Visit: Payer: Self-pay

## 2019-01-23 ENCOUNTER — Encounter (HOSPITAL_COMMUNITY): Payer: Self-pay | Admitting: Emergency Medicine

## 2019-01-23 ENCOUNTER — Ambulatory Visit (HOSPITAL_COMMUNITY)
Admission: RE | Admit: 2019-01-23 | Discharge: 2019-01-23 | Disposition: A | Discharge status: Home / Self Care | Payer: Medicaid Other | Source: Ambulatory Visit | Attending: Family Medicine | Admitting: Family Medicine

## 2019-01-23 ENCOUNTER — Emergency Department (HOSPITAL_COMMUNITY)
Admission: EM | Admit: 2019-01-23 | Discharge: 2019-01-23 | Disposition: A | Discharge status: Home / Self Care | Payer: Medicaid Other | Attending: Emergency Medicine | Admitting: Emergency Medicine

## 2019-01-23 ENCOUNTER — Ambulatory Visit (INDEPENDENT_AMBULATORY_CARE_PROVIDER_SITE_OTHER): Payer: Medicaid Other | Admitting: Family Medicine

## 2019-01-23 VITALS — BP 124/60 | HR 81

## 2019-01-23 DIAGNOSIS — R102 Pelvic and perineal pain: Secondary | ICD-10-CM | POA: Diagnosis not present

## 2019-01-23 DIAGNOSIS — D259 Leiomyoma of uterus, unspecified: Secondary | ICD-10-CM | POA: Diagnosis not present

## 2019-01-23 DIAGNOSIS — N83209 Unspecified ovarian cyst, unspecified side: Secondary | ICD-10-CM

## 2019-01-23 DIAGNOSIS — R079 Chest pain, unspecified: Secondary | ICD-10-CM

## 2019-01-23 DIAGNOSIS — R071 Chest pain on breathing: Secondary | ICD-10-CM | POA: Diagnosis not present

## 2019-01-23 DIAGNOSIS — R0789 Other chest pain: Secondary | ICD-10-CM | POA: Diagnosis not present

## 2019-01-23 DIAGNOSIS — N83202 Unspecified ovarian cyst, left side: Secondary | ICD-10-CM | POA: Insufficient documentation

## 2019-01-23 DIAGNOSIS — Z23 Encounter for immunization: Secondary | ICD-10-CM

## 2019-01-23 DIAGNOSIS — N761 Subacute and chronic vaginitis: Secondary | ICD-10-CM | POA: Diagnosis not present

## 2019-01-23 DIAGNOSIS — R1032 Left lower quadrant pain: Secondary | ICD-10-CM

## 2019-01-23 DIAGNOSIS — D251 Intramural leiomyoma of uterus: Secondary | ICD-10-CM | POA: Diagnosis not present

## 2019-01-23 LAB — CBC
HCT: 30.1 % — ABNORMAL LOW (ref 36.0–46.0)
Hemoglobin: 9.4 g/dL — ABNORMAL LOW (ref 12.0–15.0)
MCH: 20.9 pg — ABNORMAL LOW (ref 26.0–34.0)
MCHC: 31.2 g/dL (ref 30.0–36.0)
MCV: 67 fL — ABNORMAL LOW (ref 80.0–100.0)
Platelets: 328 10*3/uL (ref 150–400)
RBC: 4.49 MIL/uL (ref 3.87–5.11)
RDW: 17.4 % — ABNORMAL HIGH (ref 11.5–15.5)
WBC: 5.5 10*3/uL (ref 4.0–10.5)
nRBC: 0 % (ref 0.0–0.2)

## 2019-01-23 LAB — BASIC METABOLIC PANEL
Anion gap: 9 (ref 5–15)
BUN: 11 mg/dL (ref 6–20)
CO2: 23 mmol/L (ref 22–32)
Calcium: 9.2 mg/dL (ref 8.9–10.3)
Chloride: 104 mmol/L (ref 98–111)
Creatinine, Ser: 1 mg/dL (ref 0.44–1.00)
GFR calc Af Amer: >60 mL/min (ref >60)
GFR calc non Af Amer: >60 mL/min (ref >60)
Glucose, Bld: 99 mg/dL (ref 70–99)
Potassium: 4.1 mmol/L (ref 3.5–5.1)
Sodium: 136 mmol/L (ref 135–145)

## 2019-01-23 LAB — URINALYSIS, ROUTINE W REFLEX MICROSCOPIC
Bilirubin Urine: NEGATIVE
Glucose, UA: NEGATIVE mg/dL
Hgb urine dipstick: NEGATIVE
Ketones, ur: NEGATIVE mg/dL
Leukocytes,Ua: NEGATIVE
Nitrite: NEGATIVE
Protein, ur: NEGATIVE mg/dL
Specific Gravity, Urine: 1.014 (ref 1.005–1.030)
pH: 6 (ref 5.0–8.0)

## 2019-01-23 LAB — WET PREP, GENITAL
Sperm: NONE SEEN
Trich, Wet Prep: NONE SEEN
Yeast Wet Prep HPF POC: NONE SEEN

## 2019-01-23 LAB — I-STAT BETA HCG BLOOD, ED (MC, WL, AP ONLY): I-stat hCG, quantitative: <5 m[IU]/mL (ref <5)

## 2019-01-23 LAB — TROPONIN I (HIGH SENSITIVITY)
Troponin I (High Sensitivity): 2 ng/L (ref <18)
Troponin I (High Sensitivity): <2 ng/L (ref <18)

## 2019-01-23 LAB — HIV ANTIBODY (ROUTINE TESTING W REFLEX): HIV Screen 4th Generation wRfx: NONREACTIVE

## 2019-01-23 LAB — D-DIMER, QUANTITATIVE: D-Dimer, Quant: 0.31 ug/mL-FEU (ref 0.00–0.50)

## 2019-01-23 MED ORDER — METRONIDAZOLE 500 MG PO TABS: 500.0000 mg | ORAL_TABLET | Freq: Two times a day (BID) | ORAL | 0 refills | Status: DC

## 2019-01-23 MED ORDER — ASPIRIN 325 MG PO TABS
325.0000 mg | ORAL_TABLET | Freq: Once | ORAL | Status: AC
  Administered 2019-01-23: 325 mg via ORAL

## 2019-01-23 MED ORDER — SODIUM CHLORIDE 0.9% FLUSH: 3.0000 mL | Freq: Once | INTRAVENOUS | Status: DC
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
## 2019-01-23 NOTE — ED Notes (Signed)
Pt not in room for blood draw.

## 2019-01-23 NOTE — ED Triage Notes (Signed)
Pt arrives by [facility] from family medicine with c/o of chest pain x 2 weeks. Pt reports pain has been coming and going currently feels like pressure. Pt was given a SL nitro and now has a migraine requesting the lights stay off in triage due to headache. Pt has EKG at family medicine in [place].

## 2019-01-23 NOTE — Progress Notes (Signed)
Subjective:    Patient ID: Latoya Guzman, female    DOB: 1976/04/23, 43 y.o.   MRN: [id number]   CC: " Chest pain and left foot pain"   HPI: Latoya Guzman is a 43 year old female mild valvular heart disease and anti-phospholipid syndrome presenting discuss the following:  Chest pain: Present for the past 2 weeks, sudden onset.  Present central left sternal, feels like a pulling/pushing sensation.  Pleuritic sometimes in nature, feels like when she takes a deep breath it will hurt more.  It has been hurting so bad that when it comes on she has to stop what she is doing.  Usually lasting about 25 minutes at a time, happening 3-4 times a day. Says with 1 of the episodes last week that she felt very diaphoretic and short of breath with numbness/tingling down Latoya Guzman left arm.  Feels nauseous often with the episodes.  She had similar chest pains several years ago, however feels slightly different, was admitted for chest pain rule out then. Had an exercise stress test in 2018 that was normal.  Does have a history of mild tricuspid and mitral regurgitation, but otherwise echo EF normal.  Has a history of anti-phospholipid syndrome.  Does not exercise, denies any recent heavy lifting.  Area does not hurt with palpation.  Has not seen any association with eating.  Denies any recent fever, coughing, pre-/syncopal episodes, weakness/numbness.  Says Latoya Guzman left lower leg is slightly swollen with a bruise after Latoya Guzman son jumped on it yesterday, but does not believe she has had any lower extremity swelling before that.  Currently experiencing the chest pain, however milder than it has been.   Left hip pain: For the past several weeks. In discussion, pain seems to be more in Latoya Guzman left lower quadrant.  Feels nauseous sometimes as above, however denies any vomiting, recent change in appetite/eating, diarrhea, melena/hematochezia, fever.  Had a normal BM today.  However, sometimes goes several days without having a bowel  movement.  Had a tubal ligation several years ago, is sexually active does not use protection.  Finish LMP yesterday, however has been irregular/on and off since few days before August 10.  Thinks she might want to be checked for STDs. Denies any dysuria, dyspareunia.   Smoking status reviewed  Review of Systems Per HPI    Objective:  BP 124/60    Pulse 81    LMP 12/19/2018 (Approximate) Comment: has been constant since aug 10, 2-3 days off between cycles   SpO2 98%    Breastfeeding No  Vitals and nursing note reviewed  General: NAD, pleasant Cardiac: RRR, normal heart sounds.  Nontender to palpation of anterior chest. Respiratory: CTAB, normal effort without any wheezing, crackles, rales noted.  Endorses worsening of chest pain with deep inhalation on exam.  Satting appropriately on RA. Abdomen: soft, tender to the left lower quadrant, non-distended,  Extremities: no edema or cyanosis.  Do note mild ecchymoses on left lower leg without any appreciable pitting edema bilaterally.  5/5 lower extremity strength. Neuro: alert and oriented  Psych: normal affect  Assessment & Plan:   Chest pain 2-week history of pleuritic substernal chest pain that improves with rest in the setting of known antiphospholipid syndrome. While reassuringly Latoya Guzman vitals are stable with relatively unremarkable EKG (NSR without any significant ischemic changes), do have concern for underlying PE, unstable angina and believe she would benefit from further ED evaluation given clinical history and continue to experience pain currently.  Could also consider  MSK and GI etiology, however less likely without any tenderness, changes with movement, or relation to food. No concern for pneumonia without concurrent fever or productive cough. - Sent to ED for further evaluation, transported by [facility], provided signout to charge nurse - Aspirin 325 mg - Follow-up after ED pending disposition  Left lower quadrant abdominal  pain Several week history, moderate. Differential remains broad, including constipation, ovarian pathology, diverticula, kidney stone.  Suspect may be the in the setting of constipation as she often goes several days without having a bowel movement.  Additionally considered PID as patient is sexually active without barrier protection and current irregular menstrual bleeding, however atypical given unilateral pain and duration of symptoms. Considered diverticulitis, however overall reassured she has been afebrile without concurrent diarrhea. Low concern for ectopic pregnancy in setting of tubal ligation given duration of symptoms. No symptoms suggestive of UTI/pyelonephritis. Given acuity of additional complaint above, will postpone further evaluation while presenting to the ED, however will need very close follow-up. - Recommend following up within the next few days pending ED disposition for above - Consider Upreg, STD testing with wet prep, GC/chlamydia, pelvic exam    Presenting to the ED as discussed above, follow-up within the next week (sooner rather than later) for further discussion of above.  Case discussed with Dr. Owens Shark.  [facility] Resident PGY-2

## 2019-01-23 NOTE — Assessment & Plan Note (Signed)
2-week history of pleuritic substernal chest pain that improves with rest in the setting of known antiphospholipid syndrome. While reassuringly her vitals are stable with relatively unremarkable EKG (NSR without any significant ischemic changes), do have concern for underlying PE, unstable angina and believe she would benefit from further ED evaluation given clinical history and continue to experience pain currently.  Could also consider MSK and GI etiology, however less likely without any tenderness, changes with movement, or relation to food. No concern for pneumonia without concurrent fever or productive cough. - Sent to ED for further evaluation, transported by [facility], provided signout to charge nurse - Aspirin 325 mg - Follow-up after ED pending disposition

## 2019-01-23 NOTE — ED Provider Notes (Signed)
4:15 PM-checkout from Dr. Laverta Baltimore to evaluate after completion of evaluation.  She was sent here from her PCP office for evaluation of chest discomfort, as well as abdominal discomfort.  Clinically her painful conditions are elusive.  Extensive testing has been ordered.  Clinical Course as of Jan 23 1856  Tue Jan 23, 2019  1735 Abnormal, presence of clue cells and white cells  Wet prep, genital(!) [EW]  1735 Normal  Troponin I (High Sensitivity) [EW]  1735 Normal  Basic metabolic panel [EW]  [id number] Normal except hemoglobin low, MCV low  CBC(!) [EW]  1736 Normal  I-Stat beta hCG blood, ED [EW]  1834 Normal  Urinalysis, Routine w reflex microscopic [EW]  1835 Normal  D-dimer, quantitative [EW]  1835 Normal  Troponin I (High Sensitivity) [EW]  1835 Pending  HIV Antibody (routine testing w rflx) [EW]  1836 No significant acute abnormalities.  Fibroid, endometriosis present.  Suspected mucosal fibroid versus blood products in the uterus.  Recommended repeat 6 to 12 weeks.  US PELVIC COMPLETE W TRANSVAGINAL AND TORSION R/O [EW]    Clinical Course User Index [EW] Daleen Bo, MD    Dg Chest 2 View  Result Date: 01/23/2019 CLINICAL DATA:  Chest pain for 2 weeks EXAM: CHEST - 2 VIEW COMPARISON:  10/06/2017 FINDINGS: The heart size and mediastinal contours are within normal limits. Both lungs are clear. The visualized skeletal structures are unremarkable. IMPRESSION: No active cardiopulmonary disease. Electronically Signed   By: Inez Catalina M.D.   On: 01/23/2019 11:10   US Pelvic Complete W Transvaginal And Torsion R/o  Result Date: 01/23/2019 CLINICAL DATA:  Pelvic and left lower quadrant pain EXAM: TRANSABDOMINAL AND TRANSVAGINAL ULTRASOUND OF PELVIS DOPPLER ULTRASOUND OF OVARIES TECHNIQUE: Both transabdominal and transvaginal ultrasound examinations of the pelvis were performed. Transabdominal technique was performed for global imaging of the pelvis including uterus, ovaries, adnexal  regions, and pelvic cul-de-sac. It was necessary to proceed with endovaginal exam following the transabdominal exam to visualize the endometrium and adnexa. Color and duplex Doppler ultrasound was utilized to evaluate blood flow to the ovaries. COMPARISON:  CT 06/09/2016.  Ultrasound 02/25/2015 FINDINGS: Uterus Measurements: 11.5 x 6.1 x 7.6 cm = volume: 280 mL. Heterogeneous intramural fibroid in the left uterine body/fundus measuring 2.4 x 2.1 x 2.3 cm. Nabothian cysts of the level of the cervix. Endometrium Thickness: 15 mm. There are 2 rounded echogenic foci within the endometrial cavity measuring up to 1.0 cm and 0.9 cm without internal vascularity by color Doppler. Right ovary Measurements: 2.7 x 2.0 x 2.1 cm = volume: 6 mL. Multiple small follicles. Normal appearance/no adnexal mass. Left ovary Measurements: 2.9 x 2.5 x 2.3 cm = volume: 9 mL. Rounded 1.4 cm hypoechoic area within the left ovary, possibly a hemorrhagic cyst. Left ovary is otherwise unremarkable in appearance. Left ovary is positioned high and laterally in the pelvis and was only able to be visualized transabdominally. Pulsed Doppler evaluation of both ovaries demonstrates normal low-resistance arterial and venous waveforms. Other findings No abnormal free fluid. IMPRESSION: 1. Negative for adnexal torsion. 2. Within the endometrium are 2 rounded echogenic foci measuring up to 1 cm without internal vascularity. Findings may represent internal blood products versus small submucosal fibroids or polyps. A repeat examination in 6-12 weeks at a different phase of patient's menstrual cycle is suggested to assess for resolution. 3. Intramural 2.4 cm left uterine body fibroid. 4. Probable 1.4 cm hemorrhagic cyst within the left ovary. Electronically Signed   By: Davina Poke  M.D.   On: 01/23/2019 17:05    Patient Vitals for the past 24 hrs:  BP Temp Temp src Pulse Resp SpO2  01/23/19 1815 113/81 - - - - -  01/23/19 1545 120/69 - - 70 16 100 %   01/23/19 1302 122/67 - - 64 16 100 %  01/23/19 1031 109/66 98 F (36.7 C) Oral 67 16 100 %   Medications  sodium chloride flush (NS) 0.9 % injection 3 mL (has no administration in time range)     6:36 PM Reevaluation with update and discussion. After initial assessment and treatment, an updated evaluation reveals she is comfortable.  She states that she has been bleeding regularly both with intercourse, and spontaneously over the last month.  No prior diagnosis of uterine fibroids.  She states she finished her last vaginal bleeding episode, 2 days ago.  I discussed the details of the findings, extensively and she indicated understanding.  She is agreeable to being treated for nonspecific vaginitis with Flagyl.  All questions were answered. Daleen Bo   Medical Decision Making: Patient presenting with atypical chest and abdominal pain.  She is low risk for PE based on clinical history.  D-dimer negative, doubt PE.  Abdominal pain is lower left, and ultrasound indicates left ovarian hemorrhagic cyst, small.  This could explain some of her discomfort.  She also has irregular appearance of the lining of the uterus, which is likely blood with her clinical history of irregular menses, last 2 days ago.  She is clinically stable with reassuring evaluation and can be managed as an outpatient.  CRITICAL CARE-no Performed by: Daleen Bo   Nursing Notes Reviewed/ Care Coordinated Applicable Imaging Reviewed Interpretation of Laboratory Data incorporated into ED treatment  The patient appears reasonably screened and/or stabilized for discharge and I doubt any other medical condition or other [facility] requiring further screening, evaluation, or treatment in the ED at this time prior to discharge.  The cyst is small and does not require immediate evaluation.  She also may have some fibroids causing discomfort.  She is clinically stable, with reassuring vital signs, further ED intervention is not indicated  at this time.  There is no indication for hospitalization.  Plan: Home Medications-OTC analgesia such as ibuprofen, continue regular medications; Home Treatments-rest, gradual advance activity; return here if the recommended treatment, does not improve the symptoms; Recommended follow up-PCP follow-up 3 to 4 weeks for arrangement of follow-up ultrasound imaging.    Daleen Bo, MD 01/23/19 [phone]

## 2019-01-23 NOTE — Discharge Instructions (Addendum)
There were no serious causes found for your pain today.  It does not appear that you have a pulmonary embolism.  Your abdominal pain may well be from a small left ovarian cyst.  You also have some uterine fibroids which are likely contributing to your irregular menses.  You may have a vaginal infection, which will improve if treated with antibiotics.  It is important to follow-up with your primary care doctor in 3 to 4 weeks so they can visualize follow-up pelvic ultrasound to evaluate for the fibroids, and ovarian cyst.  For pain use ibuprofen 400 mg 3 times a day with meals.  You can try using a heating pad on your lower abdomen to help the discomfort, as well.  Check with your doctor for problems.

## 2019-01-23 NOTE — Assessment & Plan Note (Addendum)
Several week history, moderate. Differential remains broad, including constipation, ovarian pathology, diverticula, kidney stone.  Suspect may be the in the setting of constipation as she often goes several days without having a bowel movement.  Additionally considered PID as patient is sexually active without barrier protection and current irregular menstrual bleeding, however atypical given unilateral pain and duration of symptoms. Considered diverticulitis, however overall reassured she has been afebrile without concurrent diarrhea. Low concern for ectopic pregnancy in setting of tubal ligation given duration of symptoms. No symptoms suggestive of UTI/pyelonephritis. Given acuity of additional complaint above, will postpone further evaluation while presenting to the ED, however will need very close follow-up. - Recommend following up within the next few days pending ED disposition for above - Consider Upreg, STD testing with wet prep, GC/chlamydia, pelvic exam

## 2019-01-25 LAB — GC/CHLAMYDIA PROBE AMP (~~LOC~~) NOT AT ARMC
Chlamydia: NEGATIVE
Molecular Disclaimer: NEGATIVE
Molecular Disclaimer: NORMAL
Neisseria Gonorrhea: NEGATIVE

## 2019-01-25 LAB — RPR: RPR Ser Ql: NONREACTIVE

## 2019-02-26 ENCOUNTER — Ambulatory Visit (INDEPENDENT_AMBULATORY_CARE_PROVIDER_SITE_OTHER): Payer: Medicaid Other | Admitting: Family Medicine

## 2019-02-26 ENCOUNTER — Other Ambulatory Visit (HOSPITAL_COMMUNITY)
Admission: RE | Admit: 2019-02-26 | Discharge: 2019-02-26 | Disposition: A | Discharge status: Home / Self Care | Payer: Medicaid Other | Source: Ambulatory Visit | Attending: Family Medicine | Admitting: Family Medicine

## 2019-02-26 ENCOUNTER — Other Ambulatory Visit: Payer: Self-pay

## 2019-02-26 ENCOUNTER — Telehealth: Payer: Self-pay

## 2019-02-26 VITALS — BP 100/64 | HR 72 | Ht 71.0 in | Wt 248.4 lb

## 2019-02-26 DIAGNOSIS — R109 Unspecified abdominal pain: Secondary | ICD-10-CM | POA: Insufficient documentation

## 2019-02-26 DIAGNOSIS — B373 Candidiasis of vulva and vagina: Secondary | ICD-10-CM | POA: Diagnosis not present

## 2019-02-26 DIAGNOSIS — N949 Unspecified condition associated with female genital organs and menstrual cycle: Secondary | ICD-10-CM | POA: Diagnosis not present

## 2019-02-26 DIAGNOSIS — N898 Other specified noninflammatory disorders of vagina: Secondary | ICD-10-CM | POA: Insufficient documentation

## 2019-02-26 DIAGNOSIS — B3731 Acute candidiasis of vulva and vagina: Secondary | ICD-10-CM

## 2019-02-26 DIAGNOSIS — R1030 Lower abdominal pain, unspecified: Secondary | ICD-10-CM | POA: Diagnosis not present

## 2019-02-26 DIAGNOSIS — N859 Noninflammatory disorder of uterus, unspecified: Secondary | ICD-10-CM

## 2019-02-26 LAB — POCT WET PREP (WET MOUNT)
Clue Cells Wet Prep Whiff POC: NEGATIVE
Trichomonas Wet Prep HPF POC: ABSENT

## 2019-02-26 MED ORDER — IBUPROFEN 600 MG PO TABS: 600.0000 mg | ORAL_TABLET | Freq: Three times a day (TID) | ORAL | 0 refills | Status: DC | PRN

## 2019-02-26 MED ORDER — FLUCONAZOLE 150 MG PO TABS: 150.0000 mg | ORAL_TABLET | Freq: Once | ORAL | 0 refills | Status: AC

## 2019-02-26 NOTE — Assessment & Plan Note (Addendum)
Associated with white discharge. Previously diagnosed with BV in ED in 01/23/2019, but no treatment. Wet prep shows yeast with positive KOH. Will prescribe diflucan 150 mg x1. Will have staff call patient and inform.

## 2019-02-26 NOTE — Telephone Encounter (Signed)
Attempted to reach pt on mobile number. LVM of  appt at [place]. [place] location . For 03/05/19 arrive at 1:25pm appt is  at 1:45pm. Pt is to drink 32oz of water 1hr before appt. Pt is not to void 1hr prior to appt. Salvatore Marvel, CMA

## 2019-02-26 NOTE — Progress Notes (Signed)
Subjective:  Latoya Guzman is a 43 y.o. female who presents to the [facility] today with a chief complaint of vaginal itching.   HPI:  VAGINAL Itching  Having vaginal discharge for 4 days. Discharge consistency: thick Discharge color: white Medications tried: none  Recent antibiotic use: none Sex in last month: no Possible STD exposure:denies, but does not use condoms, b/c "allergic to latex." Informed patient of latex free condoms  Symptoms Fever: no, but endorses "always cold" Dysuria:no Vaginal bleeding: no Abdomen or Pelvic pain: yes, bilateral LQ abdominal pain, was seen in ED on 01/23/2019 for similar symptoms. [place] found  2. Within the endometrium are 2 rounded echogenic foci measuring up to 1 cm without internal vascularity. Findings may represent internal blood products versus small submucosal fibroids or polyps. A repeat examination in 6-12 weeks at a different phase of patient's menstrual cycle is suggested to assess for resolution. 3. Intramural 2.4 cm left uterine body fibroid. 4. Probable 1.4 cm hemorrhagic cyst within the left ovary.  Patient wet prep positive for BV, she did not take anything for this although it was prescribed.   Patient last period on 10/02, normal.   Back pain: lower back pain Genital sores or ulcers:no Rash: no Pain during sex: no Missed menstrual period: no  Surgical History: patient has BTL for contraception  Patient would like a referral to OB/GYN for ongoing lower abdominal pain  ROS see HPI Smoking Status noted  Objective:  Physical Exam: BP 100/64   Pulse 72   Ht 5\' 11"  (1.803 m)   Wt 248 lb 6 oz (112.7 kg)   LMP 01/26/2019   SpO2 100%   BMI 34.64 kg/m   Gen: NAD, resting comfortably Pulm: NWOB,  GI: Soft, tender in bilateral lower quadrants, no rebound or guarding Nondistended. Skin: warm, dry GU: no vaginal lesions, white, thick discharge, mild CMT, bimanual exam normal Chaperoned with CMA Tashira.  Neuro:  grossly normal, moves all extremities Psych: Normal affect and thought content  Results for orders placed or performed in visit on 02/26/19 (from the past 72 hour(s))  POCT Wet Prep [facility])     Status: Abnormal   Collection Time: 02/26/19 11:53 AM  Result Value Ref Range   Source Wet Prep POC vaginal    WBC, Wet Prep HPF POC 5-10    Bacteria Wet Prep HPF POC Many (A) Few   Clue Cells Wet Prep HPF POC None None   Clue Cells Wet Prep Whiff POC Negative Whiff    Yeast Wet Prep HPF POC Moderate (A) None   KOH Wet Prep POC Few (A) None   Trichomonas Wet Prep HPF POC Absent Absent     Assessment/Plan:  Abdominal pain Lower abdominal pain. Recently diagnosed with uterine fibroid, and hemorrhagic cyst. Possibly causing pain. Will refer to OB/GYN. Patient may take ibuprofen for pain.   Endometrial disorder Recent finding of 2 round echogenic foci, approx 1 cm, on pelvic US. This occurred right before patient's period. Likely internal blood products. A repeat US is recommended. Will place order today.   Vaginal itching Associated with white discharge. Previously diagnosed with BV in ED in 01/23/2019, but no treatment. Wet prep shows yeast with positive KOH. Will prescribe diflucan 150 mg x1. Will have staff call patient and inform.     Lab Orders     POCT Wet Prep [facility])  Meds ordered this encounter  Medications  . ibuprofen (ADVIL) 600 MG tablet    Sig:  Take 1 tablet (600 mg total) by mouth every 8 (eight) hours as needed.    Dispense:  30 tablet    Refill:  0  . fluconazole (DIFLUCAN) 150 MG tablet    Sig: Take 1 tablet (150 mg total) by mouth once for 1 dose.    Dispense:  1 tablet    Refill:  0      Marny Lowenstein, MD, MS FAMILY MEDICINE RESIDENT - PGY3 02/26/2019 2:29 PM

## 2019-02-26 NOTE — Assessment & Plan Note (Addendum)
Lower abdominal pain. Recently diagnosed with uterine fibroid, and hemorrhagic cyst. Possibly causing pain. Will refer to OB/GYN. Patient may take ibuprofen for pain.

## 2019-02-26 NOTE — Patient Instructions (Addendum)
It was a pleasure to see you today! Thank you for choosing [facility] for your primary care. Latoya Guzman was seen for vaginal itching.   1.  We have tested you for bacterial vaginosis, yeast infection, gonorrhea chlamydia.  We call you with the results and prescribe medications accordingly.  2.  For your abdominal pain, we are referring you to OB/GYN that is may be related to the fibroid and cyst that they found.  You can take ibuprofen for pain as I prescribed.  3.  Since they found blood in your uterus, they recommended to repeat vaginal ultrasound.  We have ordered this. Please follow-up with your PCP to discuss results.    Best,  Marny Lowenstein, MD, MS FAMILY MEDICINE RESIDENT - PGY3 02/26/2019 10:14 AM

## 2019-02-26 NOTE — Assessment & Plan Note (Signed)
Recent finding of 2 round echogenic foci, approx 1 cm, on pelvic US. This occurred right before patient's period. Likely internal blood products. A repeat US is recommended. Will place order today.

## 2019-02-26 NOTE — Telephone Encounter (Signed)
Patient calls nurse line stating she just missed a phone call. Patient stated it is probably about her ultrasound. Patient has an [place] scheduled for 11/5, patient has been notified.

## 2019-02-27 LAB — CERVICOVAGINAL ANCILLARY ONLY
Chlamydia: NEGATIVE
Comment: NEGATIVE
Comment: NORMAL
Neisseria Gonorrhea: NEGATIVE

## 2019-03-05 ENCOUNTER — Ambulatory Visit
Admission: RE | Admit: 2019-03-05 | Discharge: 2019-03-05 | Disposition: A | Discharge status: Home / Self Care | Payer: Medicaid Other | Source: Ambulatory Visit | Attending: Family Medicine | Admitting: Family Medicine

## 2019-03-05 DIAGNOSIS — N949 Unspecified condition associated with female genital organs and menstrual cycle: Secondary | ICD-10-CM

## 2019-03-05 DIAGNOSIS — N859 Noninflammatory disorder of uterus, unspecified: Secondary | ICD-10-CM

## 2019-03-05 DIAGNOSIS — D252 Subserosal leiomyoma of uterus: Secondary | ICD-10-CM | POA: Diagnosis not present

## 2019-03-06 ENCOUNTER — Encounter: Payer: Self-pay | Admitting: Family Medicine

## 2019-03-26 ENCOUNTER — Ambulatory Visit: Payer: Medicaid Other | Admitting: Obstetrics & Gynecology

## 2019-04-04 DIAGNOSIS — F902 Attention-deficit hyperactivity disorder, combined type: Secondary | ICD-10-CM | POA: Diagnosis not present

## 2019-04-06 ENCOUNTER — Encounter: Payer: Self-pay | Admitting: Obstetrics and Gynecology

## 2019-04-06 ENCOUNTER — Other Ambulatory Visit: Payer: Self-pay

## 2019-04-06 ENCOUNTER — Ambulatory Visit (INDEPENDENT_AMBULATORY_CARE_PROVIDER_SITE_OTHER): Payer: Medicaid Other | Admitting: Obstetrics and Gynecology

## 2019-04-06 ENCOUNTER — Other Ambulatory Visit (HOSPITAL_COMMUNITY)
Admission: RE | Admit: 2019-04-06 | Discharge: 2019-04-06 | Disposition: A | Discharge status: Home / Self Care | Payer: Medicaid Other | Source: Ambulatory Visit | Attending: Obstetrics and Gynecology | Admitting: Obstetrics and Gynecology

## 2019-04-06 VITALS — BP 117/65 | HR 70 | Temp 98.3°F | Ht 71.0 in | Wt 249.5 lb

## 2019-04-06 DIAGNOSIS — N939 Abnormal uterine and vaginal bleeding, unspecified: Secondary | ICD-10-CM | POA: Insufficient documentation

## 2019-04-06 DIAGNOSIS — R1030 Lower abdominal pain, unspecified: Secondary | ICD-10-CM

## 2019-04-06 DIAGNOSIS — N840 Polyp of corpus uteri: Secondary | ICD-10-CM

## 2019-04-06 LAB — POCT PREGNANCY, URINE: Preg Test, Ur: NEGATIVE
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
## 2019-04-06 NOTE — Progress Notes (Signed)
Pt states she started having LLQ abdominal pain 2 months ago and was told she had an ovarian cyst and blood in her uterus. She had follow up US 2 weeks ago and is here today to receive results. She also states that she has had frequent, irregular periods since June.

## 2019-04-07 LAB — TSH: TSH: 2.4 u[IU]/mL (ref 0.450–4.500)

## 2019-04-07 LAB — CBC
Hematocrit: 31.1 % — ABNORMAL LOW (ref 34.0–46.6)
Hemoglobin: 9.3 g/dL — ABNORMAL LOW (ref 11.1–15.9)
MCH: 19.7 pg — ABNORMAL LOW (ref 26.6–33.0)
MCHC: 29.9 g/dL — ABNORMAL LOW (ref 31.5–35.7)
MCV: 66 fL — ABNORMAL LOW (ref 79–97)
Platelets: 358 10*3/uL (ref 150–450)
RBC: 4.71 x10E6/uL (ref 3.77–5.28)
RDW: 19.6 % — ABNORMAL HIGH (ref 11.7–15.4)
WBC: 6.5 10*3/uL (ref 3.4–10.8)

## 2019-04-07 LAB — PROLACTIN: Prolactin: 15.2 ng/mL (ref 4.8–23.3)

## 2019-04-09 NOTE — Procedures (Signed)
Endometrial Biopsy Procedure Note  Pre-operative Diagnosis: AUB  Post-operative Diagnosis: same  Procedure Details  Urine pregnancy test was done and result was negative.  The risks (including infection, bleeding, pain, and uterine perforation) and benefits of the procedure were explained to the patient and Written informed consent was obtained.  The patient was placed in the dorsal lithotomy position.  Bimanual exam showed the uterus to be in the neutral position.  A Graves' speculum inserted in the vagina, and the cervix was visualized. The cervix was then prepped with povidone iodine, and a sharp tenaculum was applied to the anterior lip of the cervix for stabilization.  A pipelle was inserted into the uterine cavity (mild force needed to break through internal os) and sounded the uterus to a depth of 9.5cm.  A Large amount of tissue was collected after 2 passes. The sample was sent for pathologic examination.  Condition: Stable  Complications: None  Plan: The patient was advised to call for any fever or for prolonged or severe pain or bleeding. She was advised to use OTC analgesics as needed for mild to moderate pain. She was advised to avoid vaginal intercourse for 48 hours or until the bleeding has completely stopped.  Durene Romans MD Attending Center for [employer])

## 2019-04-10 LAB — SURGICAL PATHOLOGY

## 2019-04-23 DIAGNOSIS — F902 Attention-deficit hyperactivity disorder, combined type: Secondary | ICD-10-CM | POA: Diagnosis not present

## 2019-04-24 DIAGNOSIS — F902 Attention-deficit hyperactivity disorder, combined type: Secondary | ICD-10-CM | POA: Diagnosis not present

## 2019-04-25 ENCOUNTER — Other Ambulatory Visit: Payer: Self-pay

## 2019-04-25 ENCOUNTER — Telehealth (INDEPENDENT_AMBULATORY_CARE_PROVIDER_SITE_OTHER): Payer: Medicaid Other | Admitting: Obstetrics and Gynecology

## 2019-04-25 ENCOUNTER — Encounter: Payer: Self-pay | Admitting: Obstetrics and Gynecology

## 2019-04-25 DIAGNOSIS — N840 Polyp of corpus uteri: Secondary | ICD-10-CM | POA: Diagnosis not present

## 2019-04-25 DIAGNOSIS — R1030 Lower abdominal pain, unspecified: Secondary | ICD-10-CM | POA: Diagnosis not present

## 2019-04-25 DIAGNOSIS — N939 Abnormal uterine and vaginal bleeding, unspecified: Secondary | ICD-10-CM

## 2019-04-25 NOTE — Progress Notes (Signed)
I connected with  [name] on 04/25/19 at  9:55 AM EST by telephone and verified that I am speaking with the correct person using two identifiers.   I discussed the limitations, risks, security and privacy concerns of performing an evaluation and management service by telephone and the availability of in person appointments. I also discussed with the patient that there may be a patient responsible charge related to this service. The patient expressed understanding and agreed to proceed.  Annabell Howells, RN 04/25/2019  9:58 AM
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
## 2019-05-02 DIAGNOSIS — F902 Attention-deficit hyperactivity disorder, combined type: Secondary | ICD-10-CM | POA: Diagnosis not present

## 2019-05-09 ENCOUNTER — Telehealth (INDEPENDENT_AMBULATORY_CARE_PROVIDER_SITE_OTHER): Payer: Medicaid Other | Admitting: Family Medicine

## 2019-05-09 ENCOUNTER — Other Ambulatory Visit: Payer: Self-pay

## 2019-05-09 NOTE — Progress Notes (Signed)
Attempted to call patient twice for virtual appointment without success. Unable to leave voicemail. No other number in chart.

## 2019-05-10 ENCOUNTER — Telehealth (INDEPENDENT_AMBULATORY_CARE_PROVIDER_SITE_OTHER): Payer: Medicaid Other | Admitting: Family Medicine

## 2019-05-10 ENCOUNTER — Other Ambulatory Visit: Payer: Self-pay

## 2019-05-10 ENCOUNTER — Encounter (HOSPITAL_COMMUNITY): Payer: Self-pay

## 2019-05-10 ENCOUNTER — Ambulatory Visit (HOSPITAL_COMMUNITY)
Admission: EM | Admit: 2019-05-10 | Discharge: 2019-05-10 | Disposition: A | Discharge status: Home / Self Care | Payer: Medicaid Other | Attending: Family Medicine | Admitting: Family Medicine

## 2019-05-10 DIAGNOSIS — R079 Chest pain, unspecified: Secondary | ICD-10-CM

## 2019-05-10 DIAGNOSIS — M79675 Pain in left toe(s): Secondary | ICD-10-CM | POA: Diagnosis not present

## 2019-05-10 DIAGNOSIS — R011 Cardiac murmur, unspecified: Secondary | ICD-10-CM | POA: Insufficient documentation

## 2019-05-10 DIAGNOSIS — Z79899 Other long term (current) drug therapy: Secondary | ICD-10-CM | POA: Insufficient documentation

## 2019-05-10 DIAGNOSIS — D573 Sickle-cell trait: Secondary | ICD-10-CM | POA: Insufficient documentation

## 2019-05-10 DIAGNOSIS — J069 Acute upper respiratory infection, unspecified: Secondary | ICD-10-CM

## 2019-05-10 DIAGNOSIS — J029 Acute pharyngitis, unspecified: Secondary | ICD-10-CM | POA: Diagnosis not present

## 2019-05-10 DIAGNOSIS — U071 COVID-19: Secondary | ICD-10-CM | POA: Insufficient documentation

## 2019-05-10 DIAGNOSIS — M79672 Pain in left foot: Secondary | ICD-10-CM | POA: Diagnosis not present

## 2019-05-10 DIAGNOSIS — R410 Disorientation, unspecified: Secondary | ICD-10-CM

## 2019-05-10 DIAGNOSIS — R05 Cough: Secondary | ICD-10-CM | POA: Diagnosis not present

## 2019-05-10 DIAGNOSIS — Z791 Long term (current) use of non-steroidal anti-inflammatories (NSAID): Secondary | ICD-10-CM | POA: Diagnosis not present

## 2019-05-10 DIAGNOSIS — H532 Diplopia: Secondary | ICD-10-CM | POA: Diagnosis not present

## 2019-05-10 DIAGNOSIS — G43009 Migraine without aura, not intractable, without status migrainosus: Secondary | ICD-10-CM | POA: Diagnosis not present

## 2019-05-10 MED ORDER — KETOROLAC TROMETHAMINE 60 MG/2ML IM SOLN: 30.0000 mg | Freq: Once | INTRAMUSCULAR | Status: DC

## 2019-05-10 MED ORDER — KETOROLAC TROMETHAMINE 30 MG/ML IJ SOLN
INTRAMUSCULAR | Status: AC
  Filled 2019-05-10: qty 1

## 2019-05-10 MED ORDER — CETIRIZINE HCL 10 MG PO CAPS: 10.0000 mg | ORAL_CAPSULE | Freq: Every day | ORAL | 0 refills | Status: DC

## 2019-05-10 MED ORDER — METOCLOPRAMIDE HCL 5 MG/ML IJ SOLN
INTRAMUSCULAR | Status: AC
  Filled 2019-05-10: qty 2

## 2019-05-10 MED ORDER — METOCLOPRAMIDE HCL 5 MG/ML IJ SOLN: 5.0000 mg | Freq: Once | INTRAMUSCULAR | Status: DC

## 2019-05-10 MED ORDER — NAPROXEN 500 MG PO TABS: 500.0000 mg | ORAL_TABLET | Freq: Two times a day (BID) | ORAL | 0 refills | Status: DC | PRN

## 2019-05-10 MED ORDER — DEXAMETHASONE SODIUM PHOSPHATE 10 MG/ML IJ SOLN
INTRAMUSCULAR | Status: AC
  Filled 2019-05-10: qty 1

## 2019-05-10 MED ORDER — BENZONATATE 200 MG PO CAPS: 200.0000 mg | ORAL_CAPSULE | Freq: Three times a day (TID) | ORAL | 0 refills | Status: AC | PRN

## 2019-05-10 MED ORDER — DEXAMETHASONE SODIUM PHOSPHATE 10 MG/ML IJ SOLN: 10.0000 mg | Freq: Once | INTRAMUSCULAR | Status: DC

## 2019-05-10 NOTE — Progress Notes (Signed)
[facility] Telemedicine Visit  Patient consented to have virtual visit. Method of visit: Video was attempted, but technology challenges prevented patient from using video, so visit was conducted via telephone.  Encounter participants: Patient: Latoya Guzman - located at home Provider: [place] Avira Tillison - located at [facility]  Others (if applicable): n/a  Chief Complaint:   HPI:  Developed symptoms on Tuesday. Chest hurting and feeling confused. No longer has a dry cough. She now has a scratchy throat. She said her chest feels cloudy. She doesn't smoke. She is having chills, and thinks she may have a fever but does not have a thermometer. She is having body aches. She says she has not been feeling her self since Monday and Tuesday and Wednesday she felt her worst.   Left foot on her big toe has gone numb and has been going for a week on and off. She also feels pins and needles through her toe. She reports her toenail is a blue-ish. She reports her feet are cold. She said that her left leg is painful. She was previously told her blood on that leg is "sluggish". Was previously on a blood thinner, but has been off blood thinner for years.   ROS: per HPI  Pertinent PMHx: Antiphospholipid antibody positive not on anticoagulation,  Exam:  Genera: NAD Respiratory: able to speak in complete sentences without issue  Assessment/Plan:  Covid19 symptoms Patient with symptoms of COVID-19 as above. No known contacts. No red flag symptoms at this time.   -Counseled on wearing a mask, washing hands and avoiding social gatherings  -ED precautions discussed and patient expressed good understanding-patient to be seen in urgent care today -Patient instructed to avoid others until they meet criteria for ending isolation after any suspected COVID, which are:  -24 hours with no fever (without use of medicaitons) and -respiratory symptoms have improved (e.g. cough, shortness of breath) or   -10 days since symptoms first appeared  Left big toe pain Patient reporting that her foot is cold and that her big toe has been purple for the past week.  She also reported pain in the back of her thigh on her left leg.  Given her history of antiphospholipid antibody positive and not being on any anticoagulation advised patient to go to the urgent care in order to have her toe evaluated for worries of having a blood clot.  Time spent during visit with patient: 12 minutes  [place] Marrell Dicaprio, DO PGY-3, [place]

## 2019-05-10 NOTE — ED Triage Notes (Signed)
Patient presents to Urgent Care with complaints of migraine that feels like needles are being pushed into her temples and cough as well as sore throat and heavy feeling in her chest since three days ago. Patient reports she did a virtual visit w/ primary care this morning and was told to come here for covid testing and evaluation of discoloration and numbness of her great toe on the left foot.
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
## 2019-05-10 NOTE — Discharge Instructions (Signed)
We gave you injections of Toradol, Decadron and Reglan today for your headache. Please continue to monitor your headache and any vision symptoms if symptoms progressing or worsening, developing recurrent changes in vision, difficulty speaking, one-sided weakness, facial drooping please follow-up with emergency room for further evaluation. Please see if you are able to move your neurology appointment any sooner  Covid swab pending, quarantine until results return in approximately 2 days May use Tessalon every 8 hours as needed for cough Daily cetirizine to help with drainage/throat irritation Rest and drink fluids  Please monitor to pain over the next week Please ice and elevate foot/toe May use anti-inflammatories to help with pain-Tylenol, ibuprofen or Naprosyn Please follow-up if developing any changing symptoms, redness, increased or worsening pain swelling

## 2019-05-12 LAB — NOVEL CORONAVIRUS, NAA (HOSP ORDER, SEND-OUT TO REF LAB; TAT 18-24 HRS): SARS-CoV-2, NAA: DETECTED — AB

## 2019-05-14 ENCOUNTER — Telehealth (HOSPITAL_COMMUNITY): Payer: Self-pay | Admitting: Emergency Medicine

## 2019-05-14 NOTE — Telephone Encounter (Signed)
Your test for COVID-19 was positive, meaning that you were infected with the novel coronavirus and could give the germ to others.  Please continue isolation at home for at least 10 days since the start of your symptoms. If you do not have symptoms, please isolate at home for 10 days from the day you were tested. Once you complete your 10 day quarantine, you may return to normal activities as long as you've not had a fever for over 24 hours(without taking fever reducing medicine) and your symptoms are improving. Please continue good preventive care measures, including:  frequent hand-washing, avoid touching your face, cover coughs/sneezes, stay out of crowds and keep a 6 foot distance from others.  Go to the nearest hospital emergency room if fever/cough/breathlessness are severe or illness seems like a threat to life.  Patient contacted by phone and made aware of    results. Pt verbalized understanding and had all questions answered.  Quarantine ends jan 22nd.

## 2019-05-16 DIAGNOSIS — F902 Attention-deficit hyperactivity disorder, combined type: Secondary | ICD-10-CM | POA: Diagnosis not present

## 2019-05-22 ENCOUNTER — Encounter: Payer: Self-pay | Admitting: Neurology
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
## 2019-05-24 ENCOUNTER — Telehealth (INDEPENDENT_AMBULATORY_CARE_PROVIDER_SITE_OTHER): Payer: Medicaid Other | Admitting: Neurology

## 2019-05-24 ENCOUNTER — Other Ambulatory Visit: Payer: Self-pay

## 2019-05-24 ENCOUNTER — Encounter: Payer: Self-pay | Admitting: Neurology

## 2019-05-24 VITALS — Ht 71.0 in | Wt 249.0 lb

## 2019-05-24 DIAGNOSIS — G43109 Migraine with aura, not intractable, without status migrainosus: Secondary | ICD-10-CM | POA: Diagnosis not present

## 2019-05-24 DIAGNOSIS — G43009 Migraine without aura, not intractable, without status migrainosus: Secondary | ICD-10-CM

## 2019-05-24 DIAGNOSIS — R413 Other amnesia: Secondary | ICD-10-CM

## 2019-05-24 DIAGNOSIS — R410 Disorientation, unspecified: Secondary | ICD-10-CM

## 2019-06-25 ENCOUNTER — Other Ambulatory Visit: Payer: Self-pay

## 2019-06-25 ENCOUNTER — Ambulatory Visit
Admission: RE | Admit: 2019-06-25 | Discharge: 2019-06-25 | Disposition: A | Discharge status: Home / Self Care | Payer: Medicaid Other | Source: Ambulatory Visit | Attending: Neurology | Admitting: Neurology

## 2019-06-25 DIAGNOSIS — G43909 Migraine, unspecified, not intractable, without status migrainosus: Secondary | ICD-10-CM | POA: Diagnosis not present

## 2019-06-25 DIAGNOSIS — R413 Other amnesia: Secondary | ICD-10-CM

## 2019-06-25 DIAGNOSIS — G43009 Migraine without aura, not intractable, without status migrainosus: Secondary | ICD-10-CM

## 2019-07-10 ENCOUNTER — Other Ambulatory Visit: Payer: Self-pay

## 2019-07-10 ENCOUNTER — Encounter: Payer: Self-pay | Admitting: Family Medicine

## 2019-07-10 ENCOUNTER — Ambulatory Visit (INDEPENDENT_AMBULATORY_CARE_PROVIDER_SITE_OTHER): Payer: Medicaid Other | Admitting: Family Medicine

## 2019-07-10 VITALS — BP 120/72 | HR 76 | Wt 257.0 lb

## 2019-07-10 DIAGNOSIS — M79672 Pain in left foot: Secondary | ICD-10-CM | POA: Diagnosis not present

## 2019-07-10 NOTE — Assessment & Plan Note (Signed)
Given that patient reports wearing high heals regularly that are narrow at the toe would most likely consider that pain related to constriction.  Considered morton neuroma given that patient reports having weird sensation.  Also considered DVT as patient reported discoloration of foot and cool but given good pulses, no current pain or edema seems less likely. -xray left foot -Naproxen BID -Recommend to stop wearing high heal shoes -May benefit from orthotics and having feet sized for better shoe fitting -Follow up if symptoms worsen or if any discoloration/edema recurs

## 2019-07-10 NOTE — Progress Notes (Signed)
    SUBJECTIVE:   CHIEF COMPLAINT / HPI: Left foot pain  Pt reports having foot pain for three months.  She reports that in January her Left Great toe was blue in color and foot was freezing cold.  At that time she was advised to be seen in the ED as she was COVID positive and has a history of Antiphospholipid syndrome.  She reports never having a blood clot or miscarriages.  She stated that she did not have any imaging of her foot at that time.  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  Currently she has no lower extremity edema, numbness, tingling or discoloration of foot.  She is able to weight bear without limp and is able to move without pain.  She reports that she recently tried a pair of high heals on and felt that her arche had fallen.  Of note most of her shoes are narrow at the toes.  She also reports doing squats on her toes and she endorses having been a runner in her earlier years.    PERTINENT  PMH / PSH:  -Antiphospholipid syndrome -Migraines   OBJECTIVE:   BP 120/72   Pulse 76   Wt 257 lb (116.6 kg)   SpO2 98%   BMI 35.84 kg/m    General: Alert and oriented, no apparent distress MSK: Upper extremity strength 5/5 bilaterally, Lower extremity strength 5/5 bilaterally Left foot:  No discoloration, edema or decrease in sensation.  ROM normal.  Tenderness on palpation between Lt great toe and second metatarsal on plantar surface.  Good cap refill, pulses present.  Motor and sensation normal.    Derm: No rashes noted   ASSESSMENT/PLAN:   Foot pain, left Given that patient reports wearing high heals regularly that are narrow at the toe would most likely consider that pain related to constriction.  Considered morton neuroma given that patient reports having weird sensation.  Also considered DVT as patient reported discoloration of foot and cool but given good pulses, no current pain or edema seems less likely. -xray  left foot -Naproxen BID -Recommend to stop wearing high heal shoes -May benefit from orthotics and having feet sized for better shoe fitting -Follow up if symptoms worsen or if any discoloration/edema recurs      Carollee Leitz, MD [place]

## 2019-07-10 NOTE — Patient Instructions (Addendum)
It was a pleasure meeting you today.  You were seen for left foot pain.  You can take Naproxen twice daily for pain I would recommend to not wear high heals and have you feet sized and fitted for your footwear.  Should you have any recurrent discoloration or temperature change in you foot, please go to the call to be seen in the clinic or go to the emergency department to be evaluated for possible blood clots.  Have a great day  Carollee Leitz MD   Latoya Guzman Neuralgia  Latoya Guzman neuralgia is foot pain that affects the ball of the foot and the area near the toes. Morton neuralgia occurs when part of a nerve in the foot (digital nerve) is under too much pressure (compressed). When this happens over a long period of time, the nerve can thicken (neuroma) and cause pain. Pain usually occurs between the third and fourth toes.  Morton neuralgia can come and go but may get worse over time. What are the causes? This condition is caused by doing the same things over and over with your foot, such as:  Activities such as running or jumping.  Wearing shoes that are too tight. What increases the risk? You may be at higher risk for Morton neuralgia if you:  Are female.  Wear high heels.  Wear shoes that are narrow or tight.  Do activities that repeatedly stretch your toes, such as: ? Running. ? [place]. ? Long-distance walking. What are the signs or symptoms? The first symptom of Morton neuralgia is pain that spreads from the ball of the foot to the toes. It may feel like you are walking on a marble. Pain usually gets worse with walking and goes away at night. Other symptoms may include numbness and cramping of your toes. Both feet are equally affected, but rarely at the same time. How is this diagnosed? This condition is diagnosed based on your symptoms, your medical history, and a physical exam. Your health care provider may:  Squeeze your foot just behind your toe.  Ask you to move your toes to  check for pain.  Ask about your physical activity level. You also may have imaging tests, such as an X-ray, ultrasound, or MRI. How is this treated? Treatment depends on how severe your condition is and what causes it. Treatment may involve:  Wearing different shoes that are not too tight, are low-heeled, and provide good support. For some people, this is the only treatment needed.  Wearing an over-the-counter or custom supportive pad (orthotic) under the front of your foot.  Getting injections of numbing medicine and anti-inflammatory medicine (steroid) in the nerve.  Having surgery to remove part of the thickened nerve. Follow these instructions at home: Managing pain, stiffness, and swelling   Massage your foot as needed.  Wear orthotics as told by your health care provider.  If directed, put ice on your foot: ? Put ice in a plastic bag. ? Place a towel between your skin and the bag. ? Leave the ice on for 20 minutes, 2-3 times a day.  Avoid activities that cause pain or make pain worse. If you play sports, ask your health care provider when it is safe for you to return to sports.  Raise (elevate) your foot above the level of your heart while lying down and, when possible, while sitting. General instructions  Take over-the-counter and prescription medicines only as told by your health care provider.  Do not drive or use heavy machinery while  taking prescription pain medicine.  Wear shoes that: ? Have soft soles. ? Have a wide toe area. ? Provide arch support. ? Do not pinch or squeeze your feet. ? Have room for your orthotics, if applicable.  Keep all follow-up visits as told by your health care provider. This is important. Contact a health care provider if:  Your symptoms get worse or do not get better with treatment and home care. Summary  Morton neuralgia is foot pain that affects the ball of the foot and the area near the toes. Pain usually occurs between the  third and fourth toes, gets worse with walking, and goes away at night.  Morton neuralgia occurs when part of a nerve in the foot (digital nerve) is under too much pressure. When this happens over a long period of time, the nerve can thicken (neuroma) and cause pain.  This condition is caused by doing the same things over and over with your foot, such as running or jumping, wearing shoes that are too tight, or wearing high heels.  Treatment may involve wearing low-heeled shoes that are not too tight, wearing a supportive pad (orthotic) under the front of your foot, getting injections in the nerve, or having surgery to remove part of the thickened nerve. This information is not intended to replace advice given to you by your health care provider. Make sure you discuss any questions you have with your health care provider. Document Revised: 04/26/2017 Document Reviewed: 04/26/2017 Elsevier Patient Education  2020 [employer].

## 2019-07-11 ENCOUNTER — Ambulatory Visit
Admission: RE | Admit: 2019-07-11 | Discharge: 2019-07-11 | Disposition: A | Discharge status: Home / Self Care | Payer: Medicaid Other | Source: Ambulatory Visit | Attending: Family Medicine | Admitting: Family Medicine

## 2019-07-11 DIAGNOSIS — M79672 Pain in left foot: Secondary | ICD-10-CM

## 2019-07-31 ENCOUNTER — Encounter: Payer: Self-pay | Admitting: Family Medicine

## 2019-08-13 DIAGNOSIS — F902 Attention-deficit hyperactivity disorder, combined type: Secondary | ICD-10-CM | POA: Diagnosis not present

## 2019-08-29 DIAGNOSIS — F902 Attention-deficit hyperactivity disorder, combined type: Secondary | ICD-10-CM | POA: Diagnosis not present

## 2019-09-04 DIAGNOSIS — F902 Attention-deficit hyperactivity disorder, combined type: Secondary | ICD-10-CM | POA: Diagnosis not present

## 2019-09-12 ENCOUNTER — Emergency Department (HOSPITAL_COMMUNITY)
Admission: EM | Admit: 2019-09-12 | Discharge: 2019-09-13 | Disposition: A | Discharge status: Home / Self Care | Payer: Medicaid Other | Attending: Emergency Medicine | Admitting: Emergency Medicine

## 2019-09-12 ENCOUNTER — Other Ambulatory Visit: Payer: Self-pay

## 2019-09-12 ENCOUNTER — Encounter (HOSPITAL_COMMUNITY): Payer: Self-pay | Admitting: Emergency Medicine

## 2019-09-12 DIAGNOSIS — Y9389 Activity, other specified: Secondary | ICD-10-CM | POA: Insufficient documentation

## 2019-09-12 DIAGNOSIS — S61432A Puncture wound without foreign body of left hand, initial encounter: Secondary | ICD-10-CM | POA: Diagnosis not present

## 2019-09-12 DIAGNOSIS — W260XXA Contact with knife, initial encounter: Secondary | ICD-10-CM | POA: Insufficient documentation

## 2019-09-12 DIAGNOSIS — T148XXA Other injury of unspecified body region, initial encounter: Secondary | ICD-10-CM

## 2019-09-12 DIAGNOSIS — Y929 Unspecified place or not applicable: Secondary | ICD-10-CM | POA: Diagnosis not present

## 2019-09-12 DIAGNOSIS — Y999 Unspecified external cause status: Secondary | ICD-10-CM | POA: Insufficient documentation

## 2019-09-12 DIAGNOSIS — M79642 Pain in left hand: Secondary | ICD-10-CM | POA: Diagnosis not present

## 2019-09-12 DIAGNOSIS — L089 Local infection of the skin and subcutaneous tissue, unspecified: Secondary | ICD-10-CM | POA: Diagnosis not present

## 2019-09-12 NOTE — ED Triage Notes (Signed)
Pt presents to ED POv. Pt c/o hand lac on L hand. Lac is aprox on inch. Bleeding controlled. Tetnus shot in 03/2019

## 2019-09-13 ENCOUNTER — Emergency Department (HOSPITAL_COMMUNITY): Payer: Medicaid Other

## 2019-09-13 DIAGNOSIS — M79642 Pain in left hand: Secondary | ICD-10-CM | POA: Diagnosis not present
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
## 2019-09-20 DIAGNOSIS — F902 Attention-deficit hyperactivity disorder, combined type: Secondary | ICD-10-CM | POA: Diagnosis not present
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
## 2019-09-21 ENCOUNTER — Ambulatory Visit: Payer: Medicaid Other | Admitting: Neurology
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
## 2019-09-26 ENCOUNTER — Encounter: Payer: Self-pay | Admitting: Family Medicine

## 2019-09-26 ENCOUNTER — Other Ambulatory Visit: Payer: Self-pay

## 2019-09-26 ENCOUNTER — Ambulatory Visit (INDEPENDENT_AMBULATORY_CARE_PROVIDER_SITE_OTHER): Payer: Medicaid Other | Admitting: Family Medicine

## 2019-09-26 ENCOUNTER — Other Ambulatory Visit (HOSPITAL_COMMUNITY)
Admission: RE | Admit: 2019-09-26 | Discharge: 2019-09-26 | Disposition: A | Discharge status: Home / Self Care | Payer: Medicaid Other | Source: Ambulatory Visit | Attending: Family Medicine | Admitting: Family Medicine

## 2019-09-26 VITALS — BP 110/62 | HR 68 | Ht 71.0 in | Wt 257.4 lb

## 2019-09-26 DIAGNOSIS — M79673 Pain in unspecified foot: Secondary | ICD-10-CM | POA: Diagnosis not present

## 2019-09-26 DIAGNOSIS — N898 Other specified noninflammatory disorders of vagina: Secondary | ICD-10-CM

## 2019-09-26 DIAGNOSIS — G8929 Other chronic pain: Secondary | ICD-10-CM | POA: Diagnosis not present

## 2019-09-26 DIAGNOSIS — R600 Localized edema: Secondary | ICD-10-CM | POA: Insufficient documentation

## 2019-09-26 DIAGNOSIS — F902 Attention-deficit hyperactivity disorder, combined type: Secondary | ICD-10-CM | POA: Diagnosis not present

## 2019-09-26 DIAGNOSIS — Z113 Encounter for screening for infections with a predominantly sexual mode of transmission: Secondary | ICD-10-CM | POA: Insufficient documentation

## 2019-09-26 LAB — POCT WET PREP (WET MOUNT)
Clue Cells Wet Prep Whiff POC: POSITIVE
Trichomonas Wet Prep HPF POC: ABSENT

## 2019-09-26 NOTE — Assessment & Plan Note (Signed)
Given that edema and pain occurs mainly with long periods of standing with symptoms subsiding with elevation, likely secondary to venous insufficiency. Low suspicion for DVT give symmetry,no discoloration and  well perfusion. -Trial of compression stocking for2-3 weeks -If symptoms continue consider renal and hepatic workup as possible etiologies -Referral sent to Podiatry for further evaluation

## 2019-09-26 NOTE — Assessment & Plan Note (Signed)
Requesting STI screening. Notes foul vaginal smell with minimal discharge. -GC/C and wet mount -Follow up as needed -PAP due 2022

## 2019-09-26 NOTE — Patient Instructions (Addendum)
It was a pleasure seeing you today  I have sent a referral to Podiatry  You can purchase compression stockings over the counter.  You will need to get the 15 mmHG- 20 mmHG    Chronic Venous Insufficiency Chronic venous insufficiency is a condition where the leg veins cannot effectively pump blood from the legs to the heart. This happens when the vein walls are either stretched, weakened, or damaged, or when the valves inside the vein are damaged. With the right treatment, you should be able to continue with an active life. This condition is also called venous stasis. What are the causes? Common causes of this condition include:  High blood pressure inside the veins (venous hypertension).  Sitting or standing too long, causing increased blood pressure in the leg veins.  A blood clot that blocks blood flow in a vein (deep vein thrombosis, DVT).  Inflammation of a vein (phlebitis) that causes a blood clot to form.  Tumors in the pelvis that cause blood to back up. What increases the risk? The following factors may make you more likely to develop this condition:  Having a family history of this condition.  Obesity.  Pregnancy.  Living without enough regular physical activity or exercise (sedentary lifestyle).  Smoking.  Having a job that requires long periods of standing or sitting in one place.  Being a certain age. Women in their 41s and 51s and men in their 88s are more likely to develop this condition. What are the signs or symptoms? Symptoms of this condition include:  Veins that are enlarged, bulging, or twisted (varicose veins).  Skin breakdown or ulcers.  Reddened skin or dark discoloration of skin on the leg between the knee and ankle.  Brown, smooth, tight, and painful skin just above the ankle, usually on the inside of the leg (lipodermatosclerosis).  Swelling of the legs. How is this diagnosed? This condition may be diagnosed based on:  Your medical  history.  A physical exam.  Tests, such as: ? A procedure that creates an image of a blood vessel and nearby organs and provides information about blood flow through the blood vessel (duplex ultrasound). ? A procedure that tests blood flow (plethysmography). ? A procedure that looks at the veins using X-ray and dye (venogram). How is this treated? The goals of treatment are to help you return to an active life and to minimize pain or disability. Treatment depends on the severity of your condition, and it may include:  Wearing compression stockings. These can help relieve symptoms and help prevent your condition from getting worse. However, they do not cure the condition.  Sclerotherapy. This procedure involves an injection of a solution that shrinks damaged veins.  Surgery. This may involve: ? Removing a diseased vein (vein stripping). ? Cutting off blood flow through the vein (laser ablation surgery). ? Repairing or reconstructing a valve within the affected vein. Follow these instructions at home:      Wear compression stockings as told by your health care provider. These stockings help to prevent blood clots and reduce swelling in your legs.  Take over-the-counter and prescription medicines only as told by your health care provider.  Stay active by exercising, walking, or doing different activities. Ask your health care provider what activities are safe for you and how much exercise you need.  Drink enough fluid to keep your urine pale yellow.  Do not use any products that contain nicotine or tobacco, such as cigarettes, e-cigarettes, and chewing tobacco. If  you need help quitting, ask your health care provider.  Keep all follow-up visits as told by your health care provider. This is important. Contact a health care provider if you:  Have redness, swelling, or more pain in the affected area.  See a red streak or line that goes up or down from the affected area.  Have skin  breakdown or skin loss in the affected area, even if the breakdown is small.  Get an injury in the affected area. Get help right away if:  You get an injury and an open wound in the affected area.  You have: ? Severe pain that does not get better with medicine. ? Sudden numbness or weakness in the foot or ankle below the affected area. ? Trouble moving your foot or ankle. ? A fever. ? Worse or persistent symptoms. ? Chest pain. ? Shortness of breath. Summary  Chronic venous insufficiency is a condition where the leg veins cannot effectively pump blood from the legs to the heart.  Chronic venous insufficiency occurs when the vein walls become stretched, weakened, or damaged, or when valves within the vein are damaged.  Treatment depends on how severe your condition is. It often involves wearing compression stockings and may involve having a procedure.  Make sure you stay active by exercising, walking, or doing different activities. Ask your health care provider what activities are safe for you and how much exercise you need. This information is not intended to replace advice given to you by your health care provider. Make sure you discuss any questions you have with your health care provider. Document Revised: 01/03/2018 Document Reviewed: 01/03/2018 Elsevier Patient Education  2020 Elsevier Inc.  Carollee Leitz, MD [facility]

## 2019-09-27 LAB — CERVICOVAGINAL ANCILLARY ONLY
Chlamydia: NEGATIVE
Comment: NEGATIVE
Comment: NORMAL
Neisseria Gonorrhea: NEGATIVE
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
## 2019-10-02 ENCOUNTER — Other Ambulatory Visit: Payer: Self-pay

## 2019-10-02 ENCOUNTER — Encounter: Payer: Self-pay | Admitting: Neurology

## 2019-10-02 ENCOUNTER — Other Ambulatory Visit (INDEPENDENT_AMBULATORY_CARE_PROVIDER_SITE_OTHER): Payer: Medicaid Other

## 2019-10-02 ENCOUNTER — Ambulatory Visit (INDEPENDENT_AMBULATORY_CARE_PROVIDER_SITE_OTHER): Payer: Medicaid Other | Admitting: Neurology

## 2019-10-02 VITALS — BP 109/72 | HR 67 | Ht 71.0 in | Wt 245.2 lb

## 2019-10-02 DIAGNOSIS — R413 Other amnesia: Secondary | ICD-10-CM

## 2019-10-02 DIAGNOSIS — G43109 Migraine with aura, not intractable, without status migrainosus: Secondary | ICD-10-CM | POA: Diagnosis not present

## 2019-10-02 DIAGNOSIS — F902 Attention-deficit hyperactivity disorder, combined type: Secondary | ICD-10-CM | POA: Diagnosis not present

## 2019-10-02 LAB — VITAMIN B12: Vitamin B-12: 440 pg/mL (ref 211–911)

## 2019-10-02 MED ORDER — AIMOVIG 70 MG/ML ~~LOC~~ SOAJ: 70.0000 mg | SUBCUTANEOUS | 11 refills | Status: DC

## 2019-10-02 NOTE — Patient Instructions (Addendum)
1.  Start Aimovig injection every 28 days 2.  When you get a migraine, try Nurtec, 1 tablet (no more than 1 tablet in 24 hours).  If effective, contact me and we can place a prescription 3.  We will check B12 level. Your provider has requested that you have labwork completed today. Please go to [facility] ([id number]) on the second floor of this building before leaving the office today. You do not need to check in. If you are not called within 15 minutes please check with the front desk. 4.  Follow up in 4 months.

## 2019-10-03 ENCOUNTER — Ambulatory Visit: Payer: Medicaid Other | Admitting: Podiatry
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
## 2019-10-03 NOTE — Patient Instructions (Addendum)
It was a pleasure seeing you today.  You were seen for follow up of depression. Please contact one of the providers below to help manage your mood.  Your hemoglobin today was 9.7. I have sent in a prescription for Iron tablets to take twice a day. You can use Mirlax daily to help with constipation I encourage you to refrain from eating tissue paper.  Follow up with me in one week.  Go to ED sooner if you have any worsening symptoms.    Carollee Leitz, MD [place] Providers (No Insurance required or Self Pay)  [facility]) can see uninsured folks for outpatient therapy [email or web address] [address] [place], [place] [address] [phone]  [place] Mon-Fri, 8am-3pm [email or web address] [email or web address], [place]   [place] [phone]- [place] [facility] for psych med management, there may be a wait- if [facility] is working with clients for OPT, they will coordinate with [place] for [place]   Walk-in-Clinic: Monday- Friday 9:00 AM - 4:00 PM [place], [place] [phone]  [facility] the [place] ([employer]) walk in M-F 8am-12pm and  1pm-3pm [place]- [place]  [place]  Phone: [phone]  [employer] ([place] and substance challenges) [address], [place] [phone]    [email or web address] of the [place], [place]     Phone:  [phone] [place]  [place]  [place]  [phone] [email or web address]   [facility] ( virtual or zoom therapy) [email or web address] .[email or web address]  [place], [place]  [place]    [facility] [phone]  grief counseling, dementia and caregiver support    Al[facility] Walk-in MWF 12:30 to 3:00     [place] [address]  [phone]  [email or web address] call to schedule an appointment    [place] ,Support group, Peer support services, [address] [place], [place] [address] [phone]  [email or web address]           National Alliance on Mental Illness (NAMI) [place]- Wellness classes, Support groups        [address], [place], [place] [address] [phone]   CurrentJokes.cz   [facility]  (Psycho-social Rehabilitation clubhouse, Individual and group therapy) [address], [place] [address]   [phone]  24- Hour Availability:  *[place] or [phone] * [facility] the [employer] (Domestic Violence, Rape, etc. )[phone] [place] [phone] or [phone] * [place] [phone] only) [phone] (after hours) *[facility] Mobile Crisis Unit [phone] *[place] National Suicide Hotline ([phone] 083-8410 Diamantina Monks)

## 2019-10-04 ENCOUNTER — Encounter: Payer: Self-pay | Admitting: Family Medicine

## 2019-10-04 ENCOUNTER — Ambulatory Visit (INDEPENDENT_AMBULATORY_CARE_PROVIDER_SITE_OTHER): Payer: Medicaid Other | Admitting: Family Medicine

## 2019-10-04 ENCOUNTER — Other Ambulatory Visit: Payer: Self-pay

## 2019-10-04 VITALS — BP 110/66 | HR 68 | Ht 71.0 in | Wt 254.6 lb

## 2019-10-04 DIAGNOSIS — D509 Iron deficiency anemia, unspecified: Secondary | ICD-10-CM

## 2019-10-04 DIAGNOSIS — R600 Localized edema: Secondary | ICD-10-CM

## 2019-10-04 DIAGNOSIS — F329 Major depressive disorder, single episode, unspecified: Secondary | ICD-10-CM

## 2019-10-04 DIAGNOSIS — F32A Depression, unspecified: Secondary | ICD-10-CM

## 2019-10-04 LAB — POCT HEMOGLOBIN: Hemoglobin: 9.7 g/dL — AB (ref 11–14.6)

## 2019-10-04 MED ORDER — FERROUS SULFATE 324 (65 FE) MG PO TBEC: 324.0000 mg | DELAYED_RELEASE_TABLET | Freq: Two times a day (BID) | ORAL | 3 refills | Status: DC

## 2019-10-05 ENCOUNTER — Encounter: Payer: Self-pay | Admitting: Family Medicine

## 2019-10-05 LAB — COMPREHENSIVE METABOLIC PANEL
ALT: 20 IU/L (ref 0–32)
AST: 23 IU/L (ref 0–40)
Albumin/Globulin Ratio: 1.6 (ref 1.2–2.2)
Albumin: 4.4 g/dL (ref 3.8–4.8)
Alkaline Phosphatase: 79 IU/L (ref 48–121)
BUN/Creatinine Ratio: 15 (ref 9–23)
BUN: 13 mg/dL (ref 6–24)
Bilirubin Total: 0.3 mg/dL (ref 0.0–1.2)
CO2: 22 mmol/L (ref 20–29)
Calcium: 9.1 mg/dL (ref 8.7–10.2)
Chloride: 102 mmol/L (ref 96–106)
Creatinine, Ser: 0.86 mg/dL (ref 0.57–1.00)
GFR calc Af Amer: 97 mL/min/{1.73_m2} (ref >59)
GFR calc non Af Amer: 84 mL/min/{1.73_m2} (ref >59)
Globulin, Total: 2.8 g/dL (ref 1.5–4.5)
Glucose: 77 mg/dL (ref 65–99)
Potassium: 4.4 mmol/L (ref 3.5–5.2)
Sodium: 138 mmol/L (ref 134–144)
Total Protein: 7.2 g/dL (ref 6.0–8.5)

## 2019-10-05 LAB — CBC
Hematocrit: 32 % — ABNORMAL LOW (ref 34.0–46.6)
Hemoglobin: 9.7 g/dL — ABNORMAL LOW (ref 11.1–15.9)
MCH: 20.3 pg — ABNORMAL LOW (ref 26.6–33.0)
MCHC: 30.3 g/dL — ABNORMAL LOW (ref 31.5–35.7)
MCV: 67 fL — ABNORMAL LOW (ref 79–97)
Platelets: 290 10*3/uL (ref 150–450)
RBC: 4.78 x10E6/uL (ref 3.77–5.28)
RDW: 17.8 % — ABNORMAL HIGH (ref 11.7–15.4)
WBC: 5.2 10*3/uL (ref 3.4–10.8)

## 2019-10-05 LAB — IRON AND TIBC
Iron Saturation: 5 % — CL (ref 15–55)
Iron: 22 ug/dL — ABNORMAL LOW (ref 27–159)
Total Iron Binding Capacity: 405 ug/dL (ref 250–450)
UIBC: 383 ug/dL (ref 131–425)

## 2019-10-05 LAB — FERRITIN: Ferritin: 9 ng/mL — ABNORMAL LOW (ref 15–150)

## 2019-10-05 NOTE — Progress Notes (Signed)
Patient called.  Unable to reach patient. Sent message via My Chart with results

## 2019-10-06 LAB — PROTEIN / CREATININE RATIO, URINE
Creatinine, Urine: 152.7 mg/dL
Protein, Ur: 11.7 mg/dL
Protein/Creat Ratio: 77 mg/g creat (ref 0–200)

## 2019-10-08 ENCOUNTER — Encounter: Payer: Self-pay | Admitting: Family Medicine

## 2019-10-08 NOTE — Assessment & Plan Note (Addendum)
Reports PICA.  Last TSH 2.400(03/2019) -Hbg 9.7 today -CBC and Anemia panel -Start Ferrous sulfate BID -Can take Miralax to help with BM -Recheck in two months -Consider pelvic u/s and coagulation studies if continues to have anemia and AUB -Consider OBGYN referral

## 2019-10-08 NOTE — Assessment & Plan Note (Signed)
Continues to have bilateral lower extremity edema.  Hx previous use of NSAIDS.  Does not appear to be wearing compression stocking consistently. Given bilateral in nature less likely DVT.  Symptoms not consistent with cellulitis.  Likely weight contributing factor. Consider renal/hepatic origin for lower extremity edema.  No cardiorespiratory symptoms, less likely CHF -CMP -Urine protein creatinine ratio -Encourage continued use of stockings -Discussed weight loss

## 2019-10-08 NOTE — Assessment & Plan Note (Signed)
PHQ-2 score 2 today.  -Discussed with patient starting therapy vs medication, patient would like to try therapy first -Resources provided as well as Crisis hot line -Follow up next week

## 2019-10-09 DIAGNOSIS — F902 Attention-deficit hyperactivity disorder, combined type: Secondary | ICD-10-CM | POA: Diagnosis not present

## 2019-10-11 NOTE — Progress Notes (Signed)
No show

## 2019-10-12 ENCOUNTER — Ambulatory Visit (INDEPENDENT_AMBULATORY_CARE_PROVIDER_SITE_OTHER): Payer: Medicaid Other | Admitting: Family Medicine

## 2019-10-12 DIAGNOSIS — Z5329 Procedure and treatment not carried out because of patient's decision for other reasons: Secondary | ICD-10-CM

## 2019-10-19 DIAGNOSIS — F902 Attention-deficit hyperactivity disorder, combined type: Secondary | ICD-10-CM | POA: Diagnosis not present

## 2019-10-22 ENCOUNTER — Telehealth: Payer: Self-pay | Admitting: Neurology

## 2019-10-22 ENCOUNTER — Other Ambulatory Visit: Payer: Self-pay | Admitting: Neurology

## 2019-10-22 MED ORDER — NURTEC 75 MG PO TBDP: 1.0000 | ORAL_TABLET | Freq: Every day | ORAL | 5 refills | Status: DC | PRN

## 2019-10-22 NOTE — Telephone Encounter (Signed)
Patient states she was given a pill and an injection.  How many pills can she take per day for migraines?  Can she take the pill and also the injection in the same day? Couldn't remember what she was given. Please call.

## 2019-10-22 NOTE — Telephone Encounter (Signed)
Pt states her migraines have gotten worse. They feel like a tension headache around her temple area. Pt states the Nurtec helped some. Pt wants to have a script to sent her pharmacy.

## 2019-10-22 NOTE — Telephone Encounter (Signed)
Prescription sent

## 2019-10-23 ENCOUNTER — Encounter: Payer: Self-pay | Admitting: Neurology

## 2019-10-23 NOTE — Progress Notes (Addendum)
Latoya Guzman (Key: [id number]) Nurtec 75MG  dispersible tablets   Form [employer] Electronic Prior Authorization Form (2017 NCPDP) Created 4 hours ago Sent to Plan 4 hours ago Plan Response 4 hours ago Submit Clinical Questions 4 hours ago Determination Favorable 3 minutes ago Message from Plan Request Reference Number: [id number]. NURTEC TAB 75MG  ODT is approved through 10/25/2020. For further questions, call [employer] at [phone].

## 2019-10-24 ENCOUNTER — Ambulatory Visit (INDEPENDENT_AMBULATORY_CARE_PROVIDER_SITE_OTHER): Payer: Medicaid Other | Admitting: Podiatry

## 2019-10-24 ENCOUNTER — Encounter: Payer: Self-pay | Admitting: Podiatry

## 2019-10-24 ENCOUNTER — Other Ambulatory Visit: Payer: Self-pay

## 2019-10-24 ENCOUNTER — Ambulatory Visit (INDEPENDENT_AMBULATORY_CARE_PROVIDER_SITE_OTHER): Payer: Medicaid Other

## 2019-10-24 DIAGNOSIS — M21612 Bunion of left foot: Secondary | ICD-10-CM

## 2019-10-24 DIAGNOSIS — R600 Localized edema: Secondary | ICD-10-CM

## 2019-10-24 DIAGNOSIS — M216X2 Other acquired deformities of left foot: Secondary | ICD-10-CM

## 2019-10-24 DIAGNOSIS — I872 Venous insufficiency (chronic) (peripheral): Secondary | ICD-10-CM

## 2019-10-24 DIAGNOSIS — M79672 Pain in left foot: Secondary | ICD-10-CM

## 2019-10-24 MED ORDER — METHYLPREDNISOLONE 4 MG PO TBPK: ORAL_TABLET | ORAL | 0 refills | Status: DC

## 2019-10-24 MED ORDER — MELOXICAM 15 MG PO TABS: 15.0000 mg | ORAL_TABLET | Freq: Every day | ORAL | 1 refills | Status: DC
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
## 2019-10-25 DIAGNOSIS — F902 Attention-deficit hyperactivity disorder, combined type: Secondary | ICD-10-CM | POA: Diagnosis not present

## 2019-10-30 NOTE — Progress Notes (Signed)
No show

## 2019-10-31 ENCOUNTER — Ambulatory Visit (INDEPENDENT_AMBULATORY_CARE_PROVIDER_SITE_OTHER): Payer: Medicaid Other | Admitting: Family Medicine

## 2019-10-31 DIAGNOSIS — F902 Attention-deficit hyperactivity disorder, combined type: Secondary | ICD-10-CM | POA: Diagnosis not present

## 2019-10-31 DIAGNOSIS — Z5329 Procedure and treatment not carried out because of patient's decision for other reasons: Secondary | ICD-10-CM

## 2019-11-05 ENCOUNTER — Other Ambulatory Visit: Payer: Self-pay | Admitting: Podiatry

## 2019-11-05 DIAGNOSIS — M21612 Bunion of left foot: Secondary | ICD-10-CM

## 2019-11-14 DIAGNOSIS — F902 Attention-deficit hyperactivity disorder, combined type: Secondary | ICD-10-CM | POA: Diagnosis not present

## 2019-11-15 ENCOUNTER — Ambulatory Visit: Payer: Medicaid Other | Admitting: Skilled Nursing Facility1

## 2019-11-22 DIAGNOSIS — F902 Attention-deficit hyperactivity disorder, combined type: Secondary | ICD-10-CM | POA: Diagnosis not present

## 2019-11-28 ENCOUNTER — Ambulatory Visit: Payer: Medicaid Other | Admitting: Podiatry

## 2019-11-28 ENCOUNTER — Other Ambulatory Visit: Payer: Self-pay

## 2019-11-28 DIAGNOSIS — R600 Localized edema: Secondary | ICD-10-CM

## 2019-11-28 DIAGNOSIS — I872 Venous insufficiency (chronic) (peripheral): Secondary | ICD-10-CM

## 2019-11-28 DIAGNOSIS — M21612 Bunion of left foot: Secondary | ICD-10-CM

## 2019-11-28 DIAGNOSIS — D573 Sickle-cell trait: Secondary | ICD-10-CM
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
## 2019-12-06 DIAGNOSIS — F902 Attention-deficit hyperactivity disorder, combined type: Secondary | ICD-10-CM | POA: Diagnosis not present

## 2019-12-07 ENCOUNTER — Other Ambulatory Visit: Payer: Self-pay

## 2019-12-07 ENCOUNTER — Encounter (HOSPITAL_COMMUNITY): Payer: Self-pay

## 2019-12-07 ENCOUNTER — Ambulatory Visit (HOSPITAL_COMMUNITY)
Admission: EM | Admit: 2019-12-07 | Discharge: 2019-12-07 | Disposition: A | Discharge status: Home / Self Care | Payer: Medicaid Other | Attending: Urgent Care | Admitting: Urgent Care

## 2019-12-07 DIAGNOSIS — F419 Anxiety disorder, unspecified: Secondary | ICD-10-CM | POA: Insufficient documentation

## 2019-12-07 DIAGNOSIS — F329 Major depressive disorder, single episode, unspecified: Secondary | ICD-10-CM | POA: Insufficient documentation

## 2019-12-07 DIAGNOSIS — M329 Systemic lupus erythematosus, unspecified: Secondary | ICD-10-CM | POA: Insufficient documentation

## 2019-12-07 DIAGNOSIS — D573 Sickle-cell trait: Secondary | ICD-10-CM | POA: Diagnosis not present

## 2019-12-07 DIAGNOSIS — Z20822 Contact with and (suspected) exposure to covid-19: Secondary | ICD-10-CM | POA: Insufficient documentation

## 2019-12-07 DIAGNOSIS — Z791 Long term (current) use of non-steroidal anti-inflammatories (NSAID): Secondary | ICD-10-CM | POA: Insufficient documentation

## 2019-12-07 DIAGNOSIS — R519 Headache, unspecified: Secondary | ICD-10-CM | POA: Diagnosis not present

## 2019-12-07 DIAGNOSIS — Z79899 Other long term (current) drug therapy: Secondary | ICD-10-CM | POA: Diagnosis not present

## 2019-12-07 LAB — SARS CORONAVIRUS 2 (TAT 6-24 HRS): SARS Coronavirus 2: NEGATIVE

## 2019-12-07 MED ORDER — KETOROLAC TROMETHAMINE 60 MG/2ML IM SOLN
INTRAMUSCULAR | Status: AC
  Filled 2019-12-07: qty 2

## 2019-12-07 MED ORDER — KETOROLAC TROMETHAMINE 60 MG/2ML IM SOLN
60.0000 mg | Freq: Once | INTRAMUSCULAR | Status: AC
  Administered 2019-12-07: 60 mg via INTRAMUSCULAR

## 2019-12-07 NOTE — Discharge Instructions (Signed)
Please make sure you follow up with your neurologist, Dr. Tomi Likens, as soon as possible. Maintain your headache medications otherwise. If you develop confusion, weakness on one side of the body, vision changes, chest pain, bloody urine, numbness and tingling on one side of the body then please report to the ER immediately for a head CT scan.
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
## 2019-12-07 NOTE — ED Triage Notes (Signed)
Pt presents ongoing headache that was unrelieved with prescription medication

## 2019-12-12 DIAGNOSIS — F902 Attention-deficit hyperactivity disorder, combined type: Secondary | ICD-10-CM | POA: Diagnosis not present

## 2019-12-18 DIAGNOSIS — F902 Attention-deficit hyperactivity disorder, combined type: Secondary | ICD-10-CM | POA: Diagnosis not present

## 2019-12-26 DIAGNOSIS — F902 Attention-deficit hyperactivity disorder, combined type: Secondary | ICD-10-CM | POA: Diagnosis not present

## 2020-01-09 ENCOUNTER — Emergency Department (HOSPITAL_COMMUNITY): Payer: Medicaid Other

## 2020-01-09 ENCOUNTER — Encounter (HOSPITAL_COMMUNITY): Payer: Self-pay | Admitting: *Deleted

## 2020-01-09 ENCOUNTER — Other Ambulatory Visit: Payer: Self-pay

## 2020-01-09 ENCOUNTER — Emergency Department (HOSPITAL_COMMUNITY)
Admission: EM | Admit: 2020-01-09 | Discharge: 2020-01-09 | Disposition: A | Discharge status: Home / Self Care | Payer: Medicaid Other | Attending: Emergency Medicine | Admitting: Emergency Medicine

## 2020-01-09 DIAGNOSIS — J320 Chronic maxillary sinusitis: Secondary | ICD-10-CM | POA: Diagnosis not present

## 2020-01-09 DIAGNOSIS — Y92410 Unspecified street and highway as the place of occurrence of the external cause: Secondary | ICD-10-CM | POA: Insufficient documentation

## 2020-01-09 DIAGNOSIS — Y939 Activity, unspecified: Secondary | ICD-10-CM | POA: Insufficient documentation

## 2020-01-09 DIAGNOSIS — S0990XA Unspecified injury of head, initial encounter: Secondary | ICD-10-CM | POA: Diagnosis present

## 2020-01-09 DIAGNOSIS — M545 Low back pain: Secondary | ICD-10-CM | POA: Diagnosis not present

## 2020-01-09 DIAGNOSIS — Z79899 Other long term (current) drug therapy: Secondary | ICD-10-CM | POA: Insufficient documentation

## 2020-01-09 DIAGNOSIS — Y999 Unspecified external cause status: Secondary | ICD-10-CM | POA: Diagnosis not present

## 2020-01-09 DIAGNOSIS — S3993XA Unspecified injury of pelvis, initial encounter: Secondary | ICD-10-CM | POA: Diagnosis not present

## 2020-01-09 DIAGNOSIS — M791 Myalgia, unspecified site: Secondary | ICD-10-CM | POA: Diagnosis not present

## 2020-01-09 DIAGNOSIS — S0083XA Contusion of other part of head, initial encounter: Secondary | ICD-10-CM | POA: Insufficient documentation

## 2020-01-09 DIAGNOSIS — M7918 Myalgia, other site: Secondary | ICD-10-CM | POA: Insufficient documentation

## 2020-01-09 DIAGNOSIS — I10 Essential (primary) hypertension: Secondary | ICD-10-CM | POA: Diagnosis not present

## 2020-01-09 DIAGNOSIS — R0781 Pleurodynia: Secondary | ICD-10-CM | POA: Diagnosis not present

## 2020-01-09 DIAGNOSIS — R58 Hemorrhage, not elsewhere classified: Secondary | ICD-10-CM | POA: Diagnosis not present

## 2020-01-09 DIAGNOSIS — J341 Cyst and mucocele of nose and nasal sinus: Secondary | ICD-10-CM | POA: Diagnosis not present

## 2020-01-09 DIAGNOSIS — J3489 Other specified disorders of nose and nasal sinuses: Secondary | ICD-10-CM | POA: Diagnosis not present

## 2020-01-09 DIAGNOSIS — S0993XA Unspecified injury of face, initial encounter: Secondary | ICD-10-CM | POA: Diagnosis not present

## 2020-01-09 DIAGNOSIS — R531 Weakness: Secondary | ICD-10-CM | POA: Diagnosis not present

## 2020-01-09 DIAGNOSIS — S199XXA Unspecified injury of neck, initial encounter: Secondary | ICD-10-CM | POA: Diagnosis not present
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
## 2020-01-09 NOTE — Discharge Instructions (Signed)
Please refer to attached instructions. You may take tylenol and naprosyn together for pain as discussed. (1000 mg tylenol every 6 hours, naprosyn every 12 hours). Follow-up with your primary care provider.

## 2020-01-10 ENCOUNTER — Telehealth: Payer: Self-pay | Admitting: *Deleted

## 2020-01-10 DIAGNOSIS — M7918 Myalgia, other site: Secondary | ICD-10-CM

## 2020-01-10 NOTE — Telephone Encounter (Signed)
Email sent to [facility]  to schedule follow up appointment .   Latoya Guzman    PEC [phone]

## 2020-01-10 NOTE — Telephone Encounter (Signed)
Transition Care Management Unsuccessful Follow-up Telephone Call  Date of discharge and from where:  01/09/20, [facility]  Attempts:  1st Attempt  Reason for unsuccessful TCM follow-up call:  Left voice message.  Order placed for [facility] Coordination. Patient had 2 ED/Urgent Care visits in less than 2 weeks.  Lenor Coffin, RN, BSN, [facility] [phone]
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
## 2020-01-10 NOTE — Addendum Note (Signed)
Addended by: Addison Naegeli on: 01/10/2020 11:27 AM   Modules accepted: Orders

## 2020-01-14 ENCOUNTER — Ambulatory Visit (INDEPENDENT_AMBULATORY_CARE_PROVIDER_SITE_OTHER): Payer: Medicaid Other

## 2020-01-14 ENCOUNTER — Other Ambulatory Visit: Payer: Self-pay

## 2020-01-14 ENCOUNTER — Encounter (HOSPITAL_COMMUNITY): Payer: Self-pay | Admitting: Emergency Medicine

## 2020-01-14 ENCOUNTER — Telehealth: Payer: Self-pay

## 2020-01-14 ENCOUNTER — Ambulatory Visit (HOSPITAL_COMMUNITY)
Admission: EM | Admit: 2020-01-14 | Discharge: 2020-01-14 | Disposition: A | Discharge status: Home / Self Care | Payer: Medicaid Other | Attending: Family Medicine | Admitting: Family Medicine

## 2020-01-14 DIAGNOSIS — R079 Chest pain, unspecified: Secondary | ICD-10-CM | POA: Diagnosis not present

## 2020-01-14 DIAGNOSIS — R202 Paresthesia of skin: Secondary | ICD-10-CM | POA: Diagnosis not present

## 2020-01-14 DIAGNOSIS — D638 Anemia in other chronic diseases classified elsewhere: Secondary | ICD-10-CM | POA: Diagnosis present

## 2020-01-14 DIAGNOSIS — R0602 Shortness of breath: Secondary | ICD-10-CM

## 2020-01-14 DIAGNOSIS — R042 Hemoptysis: Secondary | ICD-10-CM | POA: Insufficient documentation

## 2020-01-14 DIAGNOSIS — R05 Cough: Secondary | ICD-10-CM | POA: Diagnosis not present

## 2020-01-14 DIAGNOSIS — R071 Chest pain on breathing: Secondary | ICD-10-CM | POA: Diagnosis not present

## 2020-01-14 DIAGNOSIS — R200 Anesthesia of skin: Secondary | ICD-10-CM | POA: Diagnosis not present

## 2020-01-14 LAB — CBC WITH DIFFERENTIAL/PLATELET
Abs Immature Granulocytes: 0.01 10*3/uL (ref 0.00–0.07)
Basophils Absolute: 0 10*3/uL (ref 0.0–0.1)
Basophils Relative: 0 %
Eosinophils Absolute: 0.1 10*3/uL (ref 0.0–0.5)
Eosinophils Relative: 3 %
HCT: 30.6 % — ABNORMAL LOW (ref 36.0–46.0)
Hemoglobin: 9.3 g/dL — ABNORMAL LOW (ref 12.0–15.0)
Immature Granulocytes: 0 %
Lymphocytes Relative: 28 %
Lymphs Abs: 1.3 10*3/uL (ref 0.7–4.0)
MCH: 19.7 pg — ABNORMAL LOW (ref 26.0–34.0)
MCHC: 30.4 g/dL (ref 30.0–36.0)
MCV: 65 fL — ABNORMAL LOW (ref 80.0–100.0)
Monocytes Absolute: 0.4 10*3/uL (ref 0.1–1.0)
Monocytes Relative: 9 %
Neutro Abs: 2.7 10*3/uL (ref 1.7–7.7)
Neutrophils Relative %: 60 %
Platelets: 287 10*3/uL (ref 150–400)
RBC: 4.71 MIL/uL (ref 3.87–5.11)
RDW: 18.3 % — ABNORMAL HIGH (ref 11.5–15.5)
WBC: 4.6 10*3/uL (ref 4.0–10.5)
nRBC: 0 % (ref 0.0–0.2)

## 2020-01-14 MED ORDER — PSEUDOEPH-BROMPHEN-DM 30-2-10 MG/5ML PO SYRP: 5.0000 mL | ORAL_SOLUTION | Freq: Four times a day (QID) | ORAL | 0 refills | Status: DC | PRN

## 2020-01-14 MED ORDER — CYCLOBENZAPRINE HCL 10 MG PO TABS: 10.0000 mg | ORAL_TABLET | Freq: Two times a day (BID) | ORAL | 0 refills | Status: DC | PRN

## 2020-01-14 MED ORDER — KETOROLAC TROMETHAMINE 60 MG/2ML IM SOLN
60.0000 mg | Freq: Once | INTRAMUSCULAR | Status: AC
  Administered 2020-01-14: 60 mg via INTRAMUSCULAR

## 2020-01-14 MED ORDER — ALBUTEROL SULFATE HFA 108 (90 BASE) MCG/ACT IN AERS: 2.0000 | INHALATION_SPRAY | RESPIRATORY_TRACT | 0 refills | Status: DC | PRN

## 2020-01-14 MED ORDER — KETOROLAC TROMETHAMINE 60 MG/2ML IM SOLN
INTRAMUSCULAR | Status: AC
  Filled 2020-01-14: qty 2
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
## 2020-01-14 NOTE — Discharge Instructions (Addendum)
Hemoglobin is low but stable and consistent with baseline. Chest x-ray negative and EKG no changes. If your symptoms of shortness of breath, chest pain, or any dizziness become severe  or recurs recommend follow-up evaluation in the emergency department.  I have sent over cough medicine along with an albuterol inhaler and a muscle relaxer for you to use at home.
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
## 2020-01-15 DIAGNOSIS — F902 Attention-deficit hyperactivity disorder, combined type: Secondary | ICD-10-CM | POA: Diagnosis not present

## 2020-01-16 ENCOUNTER — Ambulatory Visit (INDEPENDENT_AMBULATORY_CARE_PROVIDER_SITE_OTHER): Payer: Medicaid Other | Admitting: Family Medicine

## 2020-01-16 ENCOUNTER — Other Ambulatory Visit: Payer: Self-pay

## 2020-01-16 ENCOUNTER — Encounter: Payer: Self-pay | Admitting: Family Medicine

## 2020-01-16 VITALS — BP 116/60 | HR 74 | Ht 71.0 in | Wt 260.0 lb

## 2020-01-16 DIAGNOSIS — R202 Paresthesia of skin: Secondary | ICD-10-CM | POA: Diagnosis present
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
## 2020-01-20 ENCOUNTER — Encounter: Payer: Self-pay | Admitting: Family Medicine

## 2020-01-20 DIAGNOSIS — R202 Paresthesia of skin: Secondary | ICD-10-CM | POA: Insufficient documentation
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
## 2020-01-20 NOTE — Patient Instructions (Signed)
Follow up with Dentist for further evaluation.  It can take up to 12 months to resolve or you may always have some numbness in that area.  Follow up as needed  Carollee Leitz, MD [facility]

## 2020-01-22 ENCOUNTER — Other Ambulatory Visit: Payer: Self-pay | Admitting: *Deleted

## 2020-01-22 NOTE — Patient Instructions (Signed)
Visit Information  Ms. Latoya Guzman  - as a part of your Medicaid benefit, you are eligible for care management and care coordination services at no cost or copay. I was unable to reach you by phone today but would be happy to help you with your health related needs. Please feel free to call me @ [phone]  A member of the Managed Medicaid care management team will reach out to you again over the next 7 days.   [name] RN, BSN [place]  [employer] manager

## 2020-01-22 NOTE — Patient Outreach (Signed)
Care Coordination  01/22/2020  Latoya Guzman 03/18/1976 [id number]  An unsuccessful telephone outreach was attempted today. The patient was referred to the case management team for assistance with care management and care coordination.   Follow Up Plan: The Managed Medicaid care management team will reach out to the patient again over the next 7 days.   [name] RN, BSN [place]  [employer] manager

## 2020-01-25 DIAGNOSIS — F902 Attention-deficit hyperactivity disorder, combined type: Secondary | ICD-10-CM | POA: Diagnosis not present

## 2020-01-29 ENCOUNTER — Other Ambulatory Visit: Payer: Self-pay | Admitting: *Deleted

## 2020-01-29 ENCOUNTER — Other Ambulatory Visit: Payer: Self-pay

## 2020-01-29 DIAGNOSIS — F902 Attention-deficit hyperactivity disorder, combined type: Secondary | ICD-10-CM | POA: Diagnosis not present
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
## 2020-02-05 DIAGNOSIS — F902 Attention-deficit hyperactivity disorder, combined type: Secondary | ICD-10-CM | POA: Diagnosis not present

## 2020-02-13 DIAGNOSIS — F902 Attention-deficit hyperactivity disorder, combined type: Secondary | ICD-10-CM | POA: Diagnosis not present
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
## 2020-02-15 ENCOUNTER — Ambulatory Visit: Payer: Medicaid Other | Admitting: Neurology

## 2020-02-19 DIAGNOSIS — F902 Attention-deficit hyperactivity disorder, combined type: Secondary | ICD-10-CM | POA: Diagnosis not present

## 2020-02-22 ENCOUNTER — Ambulatory Visit: Payer: Medicaid Other | Admitting: Family Medicine

## 2020-02-25 ENCOUNTER — Ambulatory Visit (HOSPITAL_COMMUNITY)
Admission: EM | Admit: 2020-02-25 | Discharge: 2020-02-25 | Disposition: A | Discharge status: Home / Self Care | Payer: Medicaid Other | Attending: Family Medicine | Admitting: Family Medicine

## 2020-02-25 ENCOUNTER — Other Ambulatory Visit: Payer: Self-pay

## 2020-02-25 ENCOUNTER — Encounter (HOSPITAL_COMMUNITY): Payer: Self-pay | Admitting: Emergency Medicine

## 2020-02-25 DIAGNOSIS — H00014 Hordeolum externum left upper eyelid: Secondary | ICD-10-CM | POA: Diagnosis not present

## 2020-02-25 MED ORDER — ERYTHROMYCIN 5 MG/GM OP OINT: TOPICAL_OINTMENT | OPHTHALMIC | 0 refills | Status: DC

## 2020-02-25 NOTE — ED Triage Notes (Signed)
Pt c/o left eye pain onset this morning when waking up. Pt states she had on false eyelashes yesterday. Pt states she feels like her eye is burning and her vision is blurry. She states this morning there was a lot of mucous.
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
## 2020-02-26 ENCOUNTER — Ambulatory Visit: Payer: Medicaid Other

## 2020-02-27 ENCOUNTER — Ambulatory Visit: Payer: Medicaid Other
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
## 2020-02-28 NOTE — Patient Instructions (Signed)
It was great to see you!  Our plans for today:  -Today we discussed your chest pain.  We also performed an EKG which was normal. -We are checking some blood work including a CBC for anemia, and a lipid panel -I have also sent in a referral for cardiology.  They should contact you in the next 1 week to schedule an appointment to further work this up. -If you develop any chest pain with shortness of breath that does not resolve in a few seconds or is associated with nausea and vomiting I want you to go to the emergency department or call 911.  We are checking some labs today, I will call you if they are abnormal will send you a MyChart message or a letter if they are normal.  If you do not hear about your labs in the next 2 weeks please let us know.  Take care and seek immediate care sooner if you develop any concerns.   Dr. [facility]
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
## 2020-02-29 ENCOUNTER — Ambulatory Visit (INDEPENDENT_AMBULATORY_CARE_PROVIDER_SITE_OTHER): Payer: Medicaid Other | Admitting: Family Medicine

## 2020-02-29 ENCOUNTER — Other Ambulatory Visit: Payer: Self-pay | Admitting: *Deleted

## 2020-02-29 ENCOUNTER — Encounter: Payer: Self-pay | Admitting: Neurology

## 2020-02-29 ENCOUNTER — Ambulatory Visit (HOSPITAL_COMMUNITY)
Admission: RE | Admit: 2020-02-29 | Discharge: 2020-02-29 | Disposition: A | Discharge status: Home / Self Care | Payer: Medicaid Other | Source: Ambulatory Visit | Attending: Family Medicine | Admitting: Family Medicine

## 2020-02-29 ENCOUNTER — Other Ambulatory Visit: Payer: Self-pay

## 2020-02-29 VITALS — BP 112/64 | HR 79

## 2020-02-29 DIAGNOSIS — R079 Chest pain, unspecified: Secondary | ICD-10-CM

## 2020-02-29 NOTE — Patient Outreach (Signed)
Care Coordination  02/29/2020  Latoya Guzman 29-Jan-1976 [id number]  An unsuccessful telephone outreach was attempted today. The patient was referred to the case management team for assistance with care management and care coordination.   Follow Up Plan: The Managed Medicaid care management team will reach out to the patient again over the next 7-14 days.  The patient has been provided with contact information for the Managed Medicaid care management team and has been advised to call with any health related questions or concerns.   [name] RN, BSN [place]  [employer] manager

## 2020-02-29 NOTE — Patient Instructions (Signed)
Visit Information  Ms. Latoya Guzman  - as a part of your Medicaid benefit, you are eligible for care management and care coordination services at no cost or copay. I was unable to reach you by phone today but would be happy to help you with your health related needs. Please feel free to call me @ [phone].   A member of the Managed Medicaid care management team will reach out to you again over the next 7-14 days.   [name] RN, BSN [place]  [employer] manager
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
## 2020-02-29 NOTE — Progress Notes (Addendum)
Anitra Lauth (Key: Caroll Rancher) Aimovig 70MG /ML auto-injectors   Form [employer] Electronic Prior Authorization Form (2017 NCPDP) Created 3 days ago Sent to Plan 3 days ago Plan Response 3 days ago Submit Clinical Questions 3 days ago Determination Favorable 3 days ago Message from Plan Request Reference Number: [id number]. AIMOVIG INJ 70MG /ML is approved through 05/31/2020. For further questions, call [employer] at [phone].

## 2020-03-01 LAB — CBC
Hematocrit: 29.6 % — ABNORMAL LOW (ref 34.0–46.6)
Hemoglobin: 9 g/dL — ABNORMAL LOW (ref 11.1–15.9)
MCH: 20.6 pg — ABNORMAL LOW (ref 26.6–33.0)
MCHC: 30.4 g/dL — ABNORMAL LOW (ref 31.5–35.7)
MCV: 68 fL — ABNORMAL LOW (ref 79–97)
Platelets: 349 10*3/uL (ref 150–450)
RBC: 4.36 x10E6/uL (ref 3.77–5.28)
RDW: 19.2 % — ABNORMAL HIGH (ref 11.7–15.4)
WBC: 4.3 10*3/uL (ref 3.4–10.8)

## 2020-03-01 LAB — LIPID PANEL
Chol/HDL Ratio: 2.7 ratio (ref 0.0–4.4)
Cholesterol, Total: 129 mg/dL (ref 100–199)
HDL: 48 mg/dL (ref >39)
LDL Chol Calc (NIH): 69 mg/dL (ref 0–99)
Triglycerides: 55 mg/dL (ref 0–149)
VLDL Cholesterol Cal: 12 mg/dL (ref 5–40)

## 2020-03-11 DIAGNOSIS — F902 Attention-deficit hyperactivity disorder, combined type: Secondary | ICD-10-CM | POA: Diagnosis not present

## 2020-03-19 ENCOUNTER — Ambulatory Visit: Payer: Medicaid Other | Admitting: Cardiology

## 2020-04-02 DIAGNOSIS — M461 Sacroiliitis, not elsewhere classified: Secondary | ICD-10-CM | POA: Diagnosis not present

## 2020-04-21 ENCOUNTER — Ambulatory Visit (INDEPENDENT_AMBULATORY_CARE_PROVIDER_SITE_OTHER): Payer: Medicaid Other | Admitting: Cardiology

## 2020-04-21 ENCOUNTER — Other Ambulatory Visit: Payer: Self-pay

## 2020-04-21 ENCOUNTER — Encounter: Payer: Self-pay | Admitting: Cardiology

## 2020-04-21 VITALS — BP 110/62 | HR 83 | Ht 71.0 in | Wt 263.6 lb

## 2020-04-21 DIAGNOSIS — R0600 Dyspnea, unspecified: Secondary | ICD-10-CM

## 2020-04-21 DIAGNOSIS — R079 Chest pain, unspecified: Secondary | ICD-10-CM

## 2020-04-21 DIAGNOSIS — R072 Precordial pain: Secondary | ICD-10-CM

## 2020-04-21 DIAGNOSIS — R0609 Other forms of dyspnea: Secondary | ICD-10-CM

## 2020-04-21 MED ORDER — METOPROLOL TARTRATE 100 MG PO TABS: ORAL_TABLET | ORAL | 0 refills | Status: DC

## 2020-04-21 NOTE — Patient Instructions (Addendum)
Medication Instructions:  Your physician recommends that you continue on your current medications as directed. Please refer to the Current Medication list given to you today.  *If you need a refill on your cardiac medications before your next appointment, please call your pharmacy*   Testing/Procedures: Your physician has requested that you have an echocardiogram. Echocardiography is a painless test that uses sound waves to create images of your heart. It provides your doctor with information about the size and shape of your heart and how well your hearts chambers and valves are working. This procedure takes approximately one hour. There are no restrictions for this procedure.  Your physician has requested that you have a coronary CTA scan. Please see below for further instructions.    Follow-Up: At [facility], you and your health needs are our priority.  As part of our continuing mission to provide you with exceptional heart care, we have created designated Provider Care Teams.  These Care Teams include your primary Cardiologist (physician) and Advanced Practice Providers (APPs -  Physician Assistants and Nurse Practitioners) who all work together to provide you with the care you need, when you need it.  Follow up with Dr. Radford Pax as needed based on results of testing.   Other Instructions  Your cardiac CT will be scheduled at:   [facility] [address] [place], [place] [address] [phone]  Please arrive at the [facility] main entrance of [facility] 30 minutes prior to test start time. Proceed to the [facility] Radiology Department (first floor) to check-in and test prep.  Please follow these instructions carefully (unless otherwise directed):  On the Night Before the Test:  Be sure to Drink plenty of water.  Do not consume any caffeinated/decaffeinated beverages or chocolate 12 hours prior to your test.  Do not take any antihistamines 12 hours  prior to your test.  On the Day of the Test:  Drink plenty of water. Do not drink any water within one hour of the test.  Do not eat any food 4 hours prior to the test.  You may take your regular medications prior to the test.   Take metoprolol (Lopressor) two hours prior to test.  FEMALES- please wear underwire-free bra if available       After the Test:  Drink plenty of water.  After receiving IV contrast, you may experience a mild flushed feeling. This is normal.  On occasion, you may experience a mild rash up to 24 hours after the test. This is not dangerous. If this occurs, you can take Benadryl 25 mg and increase your fluid intake.  If you experience trouble breathing, this can be serious. If it is severe call 911 IMMEDIATELY. If it is mild, please call our office.  If you take any of these medications: Glipizide/Metformin, Avandament, Glucavance, please do not take 48 hours after completing test unless otherwise instructed.   Once we have confirmed authorization from your insurance company, we will call you to set up a date and time for your test. Based on how quickly your insurance processes prior authorizations requests, please allow up to 4 weeks to be contacted for scheduling your Cardiac CT appointment. Be advised that routine Cardiac CT appointments could be scheduled as many as 8 weeks after your provider has ordered it.  For non-scheduling related questions, please contact the cardiac imaging nurse navigator should you have any questions/concerns: Marchia Bond, Cardiac Imaging Nurse Navigator Burley Saver, Interim Cardiac Imaging Nurse Navigator  [facility] Direct Office Dial: [phone]   For scheduling needs, including cancellations and rescheduling, please call [place], [phone].

## 2020-04-21 NOTE — Addendum Note (Signed)
Addended by: Theresia Majors on: 04/21/2020 09:36 AM   Modules accepted: Orders
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
## 2020-05-05 DIAGNOSIS — M461 Sacroiliitis, not elsewhere classified: Secondary | ICD-10-CM | POA: Diagnosis not present

## 2020-05-15 ENCOUNTER — Other Ambulatory Visit: Payer: Self-pay

## 2020-05-15 ENCOUNTER — Ambulatory Visit (HOSPITAL_COMMUNITY): Payer: Medicaid Other | Attending: Internal Medicine

## 2020-05-15 DIAGNOSIS — R079 Chest pain, unspecified: Secondary | ICD-10-CM | POA: Diagnosis not present

## 2020-05-15 DIAGNOSIS — R0600 Dyspnea, unspecified: Secondary | ICD-10-CM

## 2020-05-15 DIAGNOSIS — R0609 Other forms of dyspnea: Secondary | ICD-10-CM

## 2020-05-15 LAB — ECHOCARDIOGRAM COMPLETE
Area-P 1/2: 4.24 cm2
S' Lateral: 3.1 cm

## 2020-05-19 NOTE — Progress Notes (Deleted)
    SUBJECTIVE:   CHIEF COMPLAINT / HPI:   Rectal lesion Onset Pain Drainage Constipation/straining Itching Increase in size Alleviated with Worse when  PERTINENT  PMH / PSH: ***  OBJECTIVE:   There were no vitals taken for this visit.   General: Alert, no acute distress Cardio: Normal S1 and S2, RRR, no r/m/g Pulm: CTAB, normal work of breathing Abdomen: Bowel sounds normal. Abdomen soft and non-tender.  Extremities: No peripheral edema.  Neuro: Cranial nerves grossly intact   ASSESSMENT/PLAN:   No problem-specific Assessment & Plan notes found for this encounter.     Carollee Leitz, MD [place]

## 2020-05-19 NOTE — Patient Instructions (Incomplete)
Thank you for coming to see me today. It was a pleasure. Today we talked about:   *** PAP is due 10/2020.  Please book an appointment for this.  Please follow-up with PCP as needed  If you have any questions or concerns, please do not hesitate to call the office at [phone].  Best,   Carollee Leitz, MD

## 2020-05-20 ENCOUNTER — Ambulatory Visit: Payer: Medicaid Other | Admitting: Family Medicine

## 2020-05-26 ENCOUNTER — Telehealth (HOSPITAL_COMMUNITY): Payer: Self-pay | Admitting: Emergency Medicine

## 2020-05-26 MED ORDER — METOPROLOL TARTRATE 100 MG PO TABS: ORAL_TABLET | ORAL | 0 refills | Status: DC

## 2020-05-26 NOTE — Telephone Encounter (Signed)
Reaching out to patient to offer assistance regarding upcoming cardiac imaging study; pt verbalizes understanding of appt date/time, parking situation and where to check in, pre-test NPO status and medications ordered, and verified current allergies; name and call back number provided for further questions should they arise Marchia Bond RN [facility] [phone] office [phone] cell  Pt took metoprolol prior to echo appt. I called and represcribed the medication for CT use. Pt verbalized understanding. Clarise Cruz

## 2020-05-27 ENCOUNTER — Ambulatory Visit (HOSPITAL_COMMUNITY)
Admission: RE | Admit: 2020-05-27 | Discharge: 2020-05-27 | Disposition: A | Discharge status: Home / Self Care | Payer: Medicaid Other | Source: Ambulatory Visit | Attending: Cardiology | Admitting: Cardiology

## 2020-05-27 ENCOUNTER — Other Ambulatory Visit: Payer: Self-pay

## 2020-05-27 DIAGNOSIS — Z006 Encounter for examination for normal comparison and control in clinical research program: Secondary | ICD-10-CM

## 2020-05-27 DIAGNOSIS — R072 Precordial pain: Secondary | ICD-10-CM | POA: Diagnosis not present

## 2020-05-27 MED ORDER — IOHEXOL 350 MG/ML SOLN
80.0000 mL | Freq: Once | INTRAVENOUS | Status: AC | PRN
  Administered 2020-05-27: 80 mL via INTRAVENOUS

## 2020-05-27 MED ORDER — NITROGLYCERIN 0.4 MG SL SUBL
0.8000 mg | SUBLINGUAL_TABLET | Freq: Once | SUBLINGUAL | Status: AC
  Administered 2020-05-27: 0.8 mg via SUBLINGUAL

## 2020-05-27 MED ORDER — NITROGLYCERIN 0.4 MG SL SUBL
SUBLINGUAL_TABLET | SUBLINGUAL | Status: AC
  Filled 2020-05-27: qty 2

## 2020-05-27 NOTE — Progress Notes (Signed)
CT scan completed. Tolerated well. D/C home ambulatory with friend. Awake and alert. In no distress

## 2020-05-27 NOTE — Research (Signed)
IDENTIFY Informed Consent                  Subject Name: Latoya Guzman     Subject met inclusion and exclusion criteria.  The informed consent form, study requirements and expectations were reviewed with the subject and questions and concerns were addressed prior to the signing of the consent form.  The subject verbalized understanding of the trial requirements.  The subject agreed to participate in the IDENTIFY trial and signed the informed consent.  The informed consent was obtained prior to performance of any protocol-specific procedures for the subject.  A copy of the signed informed consent was given to the subject and a copy was placed in the subject's medical record.   Meade Maw, Research Assistant 05/27/2020 11:29

## 2020-06-05 ENCOUNTER — Telehealth: Payer: Self-pay

## 2020-06-05 NOTE — Telephone Encounter (Signed)
If she has no fevers and has not been exposed to COVID she can be seen in regular clinic.  If she thinks she has been exposed and is having symptoms then she will need to be seen in [place] clinic.  Lavella Lemons

## 2020-06-05 NOTE — Telephone Encounter (Signed)
Dr. Volanda Napoleon, please advise if pt is ok to be seen in regular clinic vs CIDD clinic since she has had a cough for quite some time? Pt also would like to be seen for a bump on her bottom due to an injection and swelling on arm.

## 2020-06-09 ENCOUNTER — Encounter: Payer: Self-pay | Admitting: Family Medicine

## 2020-06-09 ENCOUNTER — Ambulatory Visit (INDEPENDENT_AMBULATORY_CARE_PROVIDER_SITE_OTHER): Payer: Medicaid Other | Admitting: Family Medicine

## 2020-06-09 ENCOUNTER — Other Ambulatory Visit: Payer: Self-pay

## 2020-06-09 VITALS — BP 112/62 | HR 100 | Ht 71.0 in | Wt 257.6 lb

## 2020-06-09 DIAGNOSIS — M7989 Other specified soft tissue disorders: Secondary | ICD-10-CM

## 2020-06-09 DIAGNOSIS — R058 Other specified cough: Secondary | ICD-10-CM | POA: Insufficient documentation

## 2020-06-09 DIAGNOSIS — K5904 Chronic idiopathic constipation: Secondary | ICD-10-CM | POA: Diagnosis not present

## 2020-06-09 MED ORDER — FLUTICASONE PROPIONATE 50 MCG/ACT NA SUSP: 2.0000 | Freq: Every day | NASAL | 6 refills | Status: DC

## 2020-06-09 NOTE — Patient Instructions (Signed)
Thank you for coming to see me today. It was a pleasure. Today we talked about:   Start taking miralax daily and make sure that you drink water during the day, aim for 2 liters at least.  Please follow-up with PCP in 1 month if no improvement.  If you have any questions or concerns, please do not hesitate to call the office at [phone].  Best,   [place] Constable, DO

## 2020-06-09 NOTE — Assessment & Plan Note (Signed)
Differential includes lipoma versus space created by previous abscess as she thinks it drained when it first started 10 years ago.  Does not seem to be fluid-filled and does not appear to be infected.  She does have similar smaller areas on bilateral gluteal folds, therefore could be caused by friction.  Could also consider that these are outpouchings of normal subcutaneous adipose tissue.  Reassured her that the appearance does not appear to warrant further investigation.  Given that it is not bothersome to her, would not recommend removal at this time.  She agrees with this.  We will continue to monitor.

## 2020-06-09 NOTE — Progress Notes (Signed)
    SUBJECTIVE:   CHIEF COMPLAINT / HPI:   Bump on her bottom Has been present for 10 years Hasn't changed Had back injection and the doctor recommended that it be examined No painful Will not feel that you have to have a BM until right before and get a sharp pain that causes her to not be able to move or talk, then goes away after a few minutes Type 3 Bristol stools once a week Has chronic abdominal pain every day Told that she has a hiatal hernia on a scan, read as "tiny" Never drained, although maybe once a few years ago  Cough  Since November Was sick then, but COVID negative Continues to have a cough Worse with air blowing on her No shortness of breath Otherwise feeling well No fevers Has a history of allergies and takes zyrtec Has felt some drainage down her throat recently  PERTINENT  PMH / PSH: Hx migraine, iron deficiency anemia, depression  OBJECTIVE:   BP 112/62   Pulse 100   Ht 5\' 11"  (1.803 m)   Wt 257 lb 9.6 oz (116.8 kg)   LMP 05/27/2020 (Exact Date)   SpO2 99%   BMI 35.93 kg/m    Physical Exam:  General: 45 y.o. female in NAD Lungs: Breathing comfortably on RA Skin: warm and dry Extremities: No edema Rectal: 1.5cm circular soft protrusion on right border intergluteal cleft, not fluid filled, not erythematous, no overlying skin changes, few smaller protrusions of similar appear on left gluteal fold as well, good rectal tone   ASSESSMENT/PLAN:   Soft tissue mass Differential includes lipoma versus space created by previous abscess as she thinks it drained when it first started 10 years ago.  Does not seem to be fluid-filled and does not appear to be infected.  She does have similar smaller areas on bilateral gluteal folds, therefore could be caused by friction.  Could also consider that these are outpouchings of normal subcutaneous adipose tissue.  Reassured her that the appearance does not appear to warrant further investigation.  Given that it is  not bothersome to her, would not recommend removal at this time.  She agrees with this.  We will continue to monitor.  Chronic idiopathic constipation She does appear to have some constipation and reassuringly has good rectal tone on digital rectal examination.  Given that her pain is somewhat difficult to pinpoint a cause, will start with constipation as she does clearly have constipation and harder stools.  We will start with MiraLAX once daily and attempt to achieve mashed potato consistency stools hopefully daily or even every other day.  Follow-up with PCP in 1 month.  Allergic cough Mentioned at the end of visit.  She seems to have some allergic symptoms.  Breathing has been okay.  Very unlikely infectious in etiology given chronicity.  Could consider post viral cough that she did have an illness that exacerbated this.  She has some allergies and has not been started on Flonase, therefore will trial this first.  Continue her Zyrtec as well.  Follow-up in 1 month with PCP.     Cleophas Dunker, [place]

## 2020-06-09 NOTE — Assessment & Plan Note (Signed)
Mentioned at the end of visit.  She seems to have some allergic symptoms.  Breathing has been okay.  Very unlikely infectious in etiology given chronicity.  Could consider post viral cough that she did have an illness that exacerbated this.  She has some allergies and has not been started on Flonase, therefore will trial this first.  Continue her Zyrtec as well.  Follow-up in 1 month with PCP.

## 2020-06-09 NOTE — Assessment & Plan Note (Signed)
She does appear to have some constipation and reassuringly has good rectal tone on digital rectal examination.  Given that her pain is somewhat difficult to pinpoint a cause, will start with constipation as she does clearly have constipation and harder stools.  We will start with MiraLAX once daily and attempt to achieve mashed potato consistency stools hopefully daily or even every other day.  Follow-up with PCP in 1 month.

## 2020-06-13 ENCOUNTER — Other Ambulatory Visit: Payer: Self-pay

## 2020-06-13 ENCOUNTER — Ambulatory Visit (HOSPITAL_COMMUNITY)
Admission: EM | Admit: 2020-06-13 | Discharge: 2020-06-13 | Disposition: A | Discharge status: Home / Self Care | Payer: Medicaid Other

## 2020-06-13 ENCOUNTER — Encounter (HOSPITAL_COMMUNITY): Payer: Self-pay

## 2020-06-13 DIAGNOSIS — H1131 Conjunctival hemorrhage, right eye: Secondary | ICD-10-CM

## 2020-06-13 NOTE — ED Triage Notes (Signed)
Pt in with c/o right eye burning and headache that started today.  Pt has not had medication for sxs   Denies any vision changes
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
## 2020-06-19 DIAGNOSIS — M542 Cervicalgia: Secondary | ICD-10-CM | POA: Diagnosis not present

## 2020-06-19 DIAGNOSIS — M7918 Myalgia, other site: Secondary | ICD-10-CM | POA: Diagnosis not present

## 2020-06-19 DIAGNOSIS — M461 Sacroiliitis, not elsewhere classified: Secondary | ICD-10-CM | POA: Diagnosis not present

## 2020-07-24 ENCOUNTER — Telehealth: Payer: Self-pay | Admitting: Family Medicine

## 2020-07-24 NOTE — Telephone Encounter (Signed)
I attempted to reach Latoya Guzman today to get her rescheduled with the [facility] with the Managed Medicaid Team. Her voicemail was full and I was not able to leave a message. I will reach out again in the next 7-14 days.

## 2020-07-30 ENCOUNTER — Telehealth: Payer: Self-pay | Admitting: Family Medicine

## 2020-07-30 NOTE — Telephone Encounter (Signed)
2nd attempt to reach Latoya Guzman to get her rescheduled for a phone visit with the Managed Medicaid team. Unable to leave a message. I will reach out again in the next 7-14 days.

## 2020-08-04 DIAGNOSIS — M461 Sacroiliitis, not elsewhere classified: Secondary | ICD-10-CM | POA: Diagnosis not present

## 2020-08-07 ENCOUNTER — Encounter (HOSPITAL_COMMUNITY): Payer: Self-pay

## 2020-08-07 ENCOUNTER — Other Ambulatory Visit: Payer: Self-pay

## 2020-08-07 ENCOUNTER — Ambulatory Visit (HOSPITAL_COMMUNITY)
Admission: EM | Admit: 2020-08-07 | Discharge: 2020-08-07 | Disposition: A | Discharge status: Home / Self Care | Payer: Medicaid Other | Attending: Family Medicine | Admitting: Family Medicine

## 2020-08-07 DIAGNOSIS — M79601 Pain in right arm: Secondary | ICD-10-CM | POA: Insufficient documentation

## 2020-08-07 DIAGNOSIS — R202 Paresthesia of skin: Secondary | ICD-10-CM | POA: Diagnosis not present

## 2020-08-07 DIAGNOSIS — G514 Facial myokymia: Secondary | ICD-10-CM | POA: Insufficient documentation

## 2020-08-07 LAB — CBC WITH DIFFERENTIAL/PLATELET
Abs Immature Granulocytes: 0.01 10*3/uL (ref 0.00–0.07)
Basophils Absolute: 0 10*3/uL (ref 0.0–0.1)
Basophils Relative: 1 %
Eosinophils Absolute: 0.1 10*3/uL (ref 0.0–0.5)
Eosinophils Relative: 2 %
HCT: 30.4 % — ABNORMAL LOW (ref 36.0–46.0)
Hemoglobin: 9.4 g/dL — ABNORMAL LOW (ref 12.0–15.0)
Immature Granulocytes: 0 %
Lymphocytes Relative: 28 %
Lymphs Abs: 1.6 10*3/uL (ref 0.7–4.0)
MCH: 19.6 pg — ABNORMAL LOW (ref 26.0–34.0)
MCHC: 30.9 g/dL (ref 30.0–36.0)
MCV: 63.3 fL — ABNORMAL LOW (ref 80.0–100.0)
Monocytes Absolute: 0.5 10*3/uL (ref 0.1–1.0)
Monocytes Relative: 9 %
Neutro Abs: 3.6 10*3/uL (ref 1.7–7.7)
Neutrophils Relative %: 60 %
Platelets: 288 10*3/uL (ref 150–400)
RBC: 4.8 MIL/uL (ref 3.87–5.11)
RDW: 18.7 % — ABNORMAL HIGH (ref 11.5–15.5)
WBC: 5.9 10*3/uL (ref 4.0–10.5)
nRBC: 0 % (ref 0.0–0.2)

## 2020-08-07 LAB — COMPREHENSIVE METABOLIC PANEL
ALT: 27 U/L (ref 0–44)
AST: 32 U/L (ref 15–41)
Albumin: 4.2 g/dL (ref 3.5–5.0)
Alkaline Phosphatase: 80 U/L (ref 38–126)
Anion gap: 5 (ref 5–15)
BUN: 11 mg/dL (ref 6–20)
CO2: 25 mmol/L (ref 22–32)
Calcium: 9.3 mg/dL (ref 8.9–10.3)
Chloride: 106 mmol/L (ref 98–111)
Creatinine, Ser: 0.86 mg/dL (ref 0.44–1.00)
GFR, Estimated: >60 mL/min (ref >60)
Glucose, Bld: 87 mg/dL (ref 70–99)
Potassium: 4.2 mmol/L (ref 3.5–5.1)
Sodium: 136 mmol/L (ref 135–145)
Total Bilirubin: 0.2 mg/dL — ABNORMAL LOW (ref 0.3–1.2)
Total Protein: 7.8 g/dL (ref 6.5–8.1)

## 2020-08-07 LAB — TSH: TSH: 1.659 u[IU]/mL (ref 0.350–4.500)

## 2020-08-07 LAB — SEDIMENTATION RATE: Sed Rate: 18 mm/hr (ref 0–22)

## 2020-08-07 MED ORDER — PREDNISONE 20 MG PO TABS: 40.0000 mg | ORAL_TABLET | Freq: Every day | ORAL | 0 refills | Status: DC
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
## 2020-08-07 NOTE — ED Triage Notes (Signed)
Pt reports right side of her mouth was twitching 4 days and today around 13:45; headache and pain in right arm today. Pt reports hands are being numb and swelling for 8 months. Denies chest pain, vision changes, dizziness, abdominal pain, diarrhea, nausea, vomiting.
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
## 2020-08-25 NOTE — Patient Instructions (Signed)
Thank you for coming to see me today.  We will get some labs today.  If they are abnormal or we need to do something about them, I will call you.  If they are normal, I will send you a message on MyChart (if it is active) or a letter in the mail.  If you don't hear from [place] in 2 weeks, please call the office at the number below.  Will refer to for EMG studies  Please  Continue to follow up with pain management   Please follow-up with PCP if symptoms worsen  If you have any questions or concerns, please do not hesitate to call the office at [phone].  Best,   Carollee Leitz, MD

## 2020-08-26 ENCOUNTER — Other Ambulatory Visit: Payer: Self-pay

## 2020-08-26 ENCOUNTER — Encounter: Payer: Self-pay | Admitting: Family Medicine

## 2020-08-26 ENCOUNTER — Ambulatory Visit (INDEPENDENT_AMBULATORY_CARE_PROVIDER_SITE_OTHER): Payer: Medicaid Other | Admitting: Family Medicine

## 2020-08-26 VITALS — BP 122/60 | HR 98 | Ht 71.0 in | Wt 255.4 lb

## 2020-08-26 DIAGNOSIS — R760 Raised antibody titer: Secondary | ICD-10-CM | POA: Diagnosis not present

## 2020-08-26 DIAGNOSIS — R200 Anesthesia of skin: Secondary | ICD-10-CM | POA: Diagnosis not present

## 2020-08-26 DIAGNOSIS — R202 Paresthesia of skin: Secondary | ICD-10-CM

## 2020-08-26 LAB — POCT GLYCOSYLATED HEMOGLOBIN (HGB A1C): HbA1c, POC (controlled diabetic range): 5.6 % (ref 0.0–7.0)

## 2020-08-27 ENCOUNTER — Telehealth: Payer: Self-pay

## 2020-08-27 LAB — HIV ANTIBODY (ROUTINE TESTING W REFLEX): HIV Screen 4th Generation wRfx: NONREACTIVE

## 2020-08-27 LAB — CBC
Hematocrit: 31 % — ABNORMAL LOW (ref 34.0–46.6)
Hemoglobin: 9.4 g/dL — ABNORMAL LOW (ref 11.1–15.9)
MCH: 19.4 pg — ABNORMAL LOW (ref 26.6–33.0)
MCHC: 30.3 g/dL — ABNORMAL LOW (ref 31.5–35.7)
MCV: 64 fL — ABNORMAL LOW (ref 79–97)
Platelets: 318 10*3/uL (ref 150–450)
RBC: 4.84 x10E6/uL (ref 3.77–5.28)
RDW: 19.7 % — ABNORMAL HIGH (ref 11.7–15.4)
WBC: 6.5 10*3/uL (ref 3.4–10.8)

## 2020-08-27 LAB — RPR: RPR Ser Ql: NONREACTIVE

## 2020-08-27 LAB — VITAMIN B12: Vitamin B-12: 797 pg/mL (ref 232–1245)

## 2020-08-27 NOTE — Telephone Encounter (Signed)
Patient calls nurse line due to potential adverse reaction to methocarbamol. Patient reports that she took medication at around 8:30-9:00 pm last night, shortly after she began experiencing numbness and tingling in bottom right lip and mouth. Patient denies difficulty breathing at this time and is able to speak in complete sentences.   Precepted with Dr. Andria Frames. Recommended that patient not take any more doses of methocarbamol and that patient could take ibuprofen and benadryl or zyrtec for possible allergic reaction. Updated patient's allergy list to include this medication.   Advised patient of provider recommendations and provided with strict ED precautions. Answered all questions.   Forwarding to PCP.   Talbot Grumbling, RN

## 2020-08-29 DIAGNOSIS — M461 Sacroiliitis, not elsewhere classified: Secondary | ICD-10-CM | POA: Diagnosis not present

## 2020-08-29 DIAGNOSIS — M5441 Lumbago with sciatica, right side: Secondary | ICD-10-CM | POA: Diagnosis not present

## 2020-08-29 DIAGNOSIS — M542 Cervicalgia: Secondary | ICD-10-CM | POA: Diagnosis not present

## 2020-08-29 DIAGNOSIS — M47816 Spondylosis without myelopathy or radiculopathy, lumbar region: Secondary | ICD-10-CM | POA: Diagnosis not present

## 2020-08-29 DIAGNOSIS — G8929 Other chronic pain: Secondary | ICD-10-CM | POA: Diagnosis not present

## 2020-08-30 ENCOUNTER — Encounter: Payer: Self-pay | Admitting: Family Medicine

## 2020-08-30 NOTE — Assessment & Plan Note (Deleted)
She was previously on anticoagulation and no history of DVT.  I would like hematology evaluate for any further management.

## 2020-08-30 NOTE — Assessment & Plan Note (Addendum)
Bilateral arm paresthesia. Recent labs wnl. Exam essential normal. Refer neurology for nerve conduction studies.   Splint wrists at night and when at work

## 2020-09-01 ENCOUNTER — Encounter: Payer: Self-pay | Admitting: Neurology

## 2020-09-09 ENCOUNTER — Telehealth: Payer: Self-pay

## 2020-09-09 DIAGNOSIS — Z006 Encounter for examination for normal comparison and control in clinical research program: Secondary | ICD-10-CM

## 2020-09-09 NOTE — Telephone Encounter (Signed)
Called patient for 90 day Identify phone call pt stated she is still having some occasional cardiac symptoms but did follow up with PCP, I reminded the patient that we would be calling her back around February for a year follow up phone call.
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
## 2020-10-07 ENCOUNTER — Other Ambulatory Visit: Payer: Self-pay

## 2020-10-07 ENCOUNTER — Encounter: Payer: Self-pay | Admitting: Neurology

## 2020-10-07 ENCOUNTER — Ambulatory Visit (INDEPENDENT_AMBULATORY_CARE_PROVIDER_SITE_OTHER): Payer: Medicaid Other | Admitting: Neurology

## 2020-10-07 VITALS — BP 123/77 | HR 77 | Ht 71.0 in | Wt 254.0 lb

## 2020-10-07 DIAGNOSIS — G43109 Migraine with aura, not intractable, without status migrainosus: Secondary | ICD-10-CM | POA: Diagnosis not present

## 2020-10-07 DIAGNOSIS — R200 Anesthesia of skin: Secondary | ICD-10-CM | POA: Diagnosis not present

## 2020-10-07 DIAGNOSIS — R413 Other amnesia: Secondary | ICD-10-CM | POA: Diagnosis not present

## 2020-10-07 DIAGNOSIS — Z87820 Personal history of traumatic brain injury: Secondary | ICD-10-CM

## 2020-10-07 NOTE — Patient Instructions (Signed)
Nerve study of both arms Neurocognitive evaluation Follow up after testing

## 2020-11-26 ENCOUNTER — Other Ambulatory Visit: Payer: Self-pay

## 2020-11-26 ENCOUNTER — Ambulatory Visit (INDEPENDENT_AMBULATORY_CARE_PROVIDER_SITE_OTHER): Payer: Medicaid Other | Admitting: Neurology

## 2020-11-26 DIAGNOSIS — R200 Anesthesia of skin: Secondary | ICD-10-CM

## 2020-11-26 NOTE — Procedures (Signed)
[facility]  [address], [address]  [place], [place] [address] Tel: [phone] Fax:  [phone] Test Date:  11/26/2020  Patient: Latoya Guzman DOB: October 10, 1975 Physician: Narda Amber, DO  Sex: Female Height: '5\' 11"'$  Ref Phys: Metta Clines, D.O.  ID#: [id number]   Technician:    Patient Complaints: This is a 45 year old female referred for evaluation of arm pain and paresthesias.  NCV & EMG Findings: Extensive electrodiagnostic testing of the right upper extremity and additional studies of the left shows: Bilateral median, ulnar, and mixed palmar sensory responses are within normal limits. Bilateral median and ulnar motor responses are within normal limits. There is no evidence of active or chronic motor axonal loss changes affecting any of the tested muscles.  Motor unit configuration and recruitment pattern is within normal limits.  Impression: This is a normal study of the upper extremities.  In particular, there is no evidence of carpal tunnel syndrome or a cervical radiculopathy.   ___________________________ Narda Amber, DO    Nerve Conduction Studies Anti Sensory Summary Table   Stim Site NR Peak (ms) Norm Peak (ms) P-T Amp (V) Norm P-T Amp  Left Median Anti Sensory (2nd Digit)  34C  Wrist    3.2 <3.4 29.2 >20  Right Median Anti Sensory (2nd Digit)  34C  Wrist    3.0 <3.4 23.7 >20  Left Ulnar Anti Sensory (5th Digit)  34C  Wrist    3.1 <3.1 33.5 >12  Right Ulnar Anti Sensory (5th Digit)  34C  Wrist    2.6 <3.1 30.1 >12   Motor Summary Table   Stim Site NR Onset (ms) Norm Onset (ms) O-P Amp (mV) Norm O-P Amp Site1 Site2 Delta-0 (ms) Dist (cm) Vel (m/s) Norm Vel (m/s)  Left Median Motor (Abd Poll Brev)  34C  Wrist    2.7 <3.9 9.2 >6 Elbow Wrist 4.6 30.0 65 >50  Elbow    7.3  9.0         Right Median Motor (Abd Poll Brev)  34C  Wrist    2.7 <3.9 11.4 >6 Elbow Wrist 4.9 31.0 63 >50  Elbow    7.6  11.1         Left Ulnar Motor (Abd Dig  Minimi)  34C  Wrist    2.2 <3.1 11.3 >7 B Elbow Wrist 3.6 24.0 67 >50  B Elbow    5.8  11.2  A Elbow B Elbow 1.6 10.0 62 >50  A Elbow    7.4  10.9         Right Ulnar Motor (Abd Dig Minimi)  34C  Wrist    2.4 <3.1 10.0 >7 B Elbow Wrist 3.7 25.0 68 >50  B Elbow    6.1  9.2  A Elbow B Elbow 1.6 10.0 62 >50  A Elbow    7.7  9.2          Comparison Summary Table   Stim Site NR Peak (ms) Norm Peak (ms) P-T Amp (V) Site1 Site2 Delta-P (ms) Norm Delta (ms)  Left Median/Ulnar Palm Comparison (Wrist - 8cm)  34C  Median Palm    1.6 <2.2 70.4 Median Palm Ulnar Palm 0.0   Ulnar Palm    1.6 <2.2 22.6      Right Median/Ulnar Palm Comparison (Wrist - 8cm)  34C  Median Palm    1.6 <2.2 42.3 Median Palm Ulnar Palm 0.2   Ulnar Palm    1.8 <2.2 8.7  EMG   Side Muscle Ins Act Fibs Psw Fasc Number Recrt Dur Dur. Amp Amp. Poly Poly. Comment  Right 1stDorInt Nml Nml Nml Nml Nml Nml Nml Nml Nml Nml Nml Nml N/A  Right PronatorTeres Nml Nml Nml Nml Nml Nml Nml Nml Nml Nml Nml Nml N/A  Right Biceps Nml Nml Nml Nml Nml Nml Nml Nml Nml Nml Nml Nml N/A  Right Triceps Nml Nml Nml Nml Nml Nml Nml Nml Nml Nml Nml Nml N/A  Right Deltoid Nml Nml Nml Nml Nml Nml Nml Nml Nml Nml Nml Nml N/A  Left 1stDorInt Nml Nml Nml Nml Nml Nml Nml Nml Nml Nml Nml Nml N/A  Left PronatorTeres Nml Nml Nml Nml Nml Nml Nml Nml Nml Nml Nml Nml N/A  Left Biceps Nml Nml Nml Nml Nml Nml Nml Nml Nml Nml Nml Nml N/A  Left Triceps Nml Nml Nml Nml Nml Nml Nml Nml Nml Nml Nml Nml N/A  Left Deltoid Nml Nml Nml Nml Nml Nml Nml Nml Nml Nml Nml Nml N/A      Waveforms:

## 2020-12-04 ENCOUNTER — Ambulatory Visit: Payer: Medicaid Other

## 2020-12-04 ENCOUNTER — Encounter: Payer: Self-pay | Admitting: Counselor

## 2020-12-04 ENCOUNTER — Other Ambulatory Visit: Payer: Self-pay

## 2020-12-04 ENCOUNTER — Ambulatory Visit (INDEPENDENT_AMBULATORY_CARE_PROVIDER_SITE_OTHER): Payer: Medicaid Other | Admitting: Counselor

## 2020-12-04 DIAGNOSIS — Z87820 Personal history of traumatic brain injury: Secondary | ICD-10-CM

## 2020-12-04 DIAGNOSIS — F09 Unspecified mental disorder due to known physiological condition: Secondary | ICD-10-CM

## 2020-12-04 DIAGNOSIS — G3184 Mild cognitive impairment, so stated: Secondary | ICD-10-CM

## 2020-12-04 NOTE — Progress Notes (Signed)
Psychometrist Note   Cognitive testing was administered to Latoya Guzman by Latoya Guzman, B.S. (Technician) under the supervision of Latoya Guzman, Psy.D., ABN. [name] was able to tolerate all test procedures. Dr. Nicole Guzman met with the patient as needed to manage any emotional reactions to the testing procedures. Rest breaks were offered.    The battery of tests administered was selected by Dr. Nicole Guzman with consideration to the patient's current level of functioning, the nature of her symptoms, emotional and behavioral responses during the interview, level of literacy, observed level of motivation/effort, and the nature of the referral question. This battery was communicated to the psychometrist. Communication between Dr. Nicole Guzman and the psychometrist was ongoing throughout the evaluation and Dr. Nicole Guzman was immediately accessible at all times. Dr. Nicole Guzman provided supervision to the technician on the date of this service, to the extent necessary to assure the quality of all services provided.    [name] will return in approximately one week for an interactive feedback session with Dr. Nicole Guzman, at which time test performance, clinical impressions, and treatment recommendations will be reviewed in detail. The patient understands she can contact our office should she require our assistance before this time.   A total of 110 minutes of billable time were spent with Latoya Guzman by the technician, including test administration and scoring time. Billing for these services is reflected in Dr. Les Pou note.   This note reflects time spent with the psychometrician and does not include test scores, clinical history, or any interpretations made by Dr. Nicole Guzman. The full report will follow in a separate note.
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
## 2020-12-05 NOTE — Progress Notes (Signed)
[facility]  Patient Name: Latoya Guzman MRN: [id number] Date of Birth: Aug 11, 1975 Age: 45 y.o. Education: 12 years  Measurement properties of test scores: IQ, Index, and Standard Scores (SS): Mean = 100; Standard Deviation = 15 Scaled Scores (Ss): Mean = 10; Standard Deviation = 3 Z scores (Z): Mean = 0; Standard Deviation = 1 T scores (T); Mean = 50; Standard Deviation = 10  TEST SCORES:    Note: This summary of test scores accompanies the interpretive report and should not be interpreted by unqualified individuals or in isolation without reference to the report. Test scores are relative to age, gender, and educational history as available and appropriate.   Performance Validity        Rey 15 and Recognition: Raw Descriptor      Free Recall 11 Within Expectation      Recognition 10 ---      False Positives 0 ---      Combined Score 21 Within Expectation      MSVT: Raw Descriptor      IR 100 Within Expectation      DR 100 Within Expectation      CNS 100 Within Expectation      PA 80 ---      FR 65 ---      TOMM Raw Descriptor      Trial 1 44 Within Expectation      The Dot Counting Test: Raw Descriptor      E-Score 16 Below Expectation      Embedded Measures: Raw Descriptor      RBANS Effort Index: 0 Within Expectation      WAIS-IV Reliable Digit Span 7 Within Expectation      WAIS-IV Reliable Digit Span Revised 11 Within Expectation      Expected Functioning        Wide Range Achievement Test (Word Reading): Standard/Scaled Score Percentile       Word Reading 80 9      Reynolds Intellectual Screening Test Standard/T-score Percentile      Guess What 35 7      Odd Item Out 39 14  RIST Index 82 12      Cognitive Testing        RBANS, Form : Standard/Scaled Score Percentile  Total Score 85 16  Immediate Memory 81 10      List Learning 11 63      Story Memory 2 <1  Visuospatial/Constructional 87 19      Figure Copy    (19) 11 63      Judgment of Line Orientation   (10) --- 3-9  Language 103 58      Picture Naming --- 51-75      Semantic Fluency 11 63  Attention 88 21      Digit Span 8 25      Coding 8 25  Delayed Memory 86 18      List Recall   (7) --- 51-75      List Recognition   (20) --- 51-75      Story Recall   (5) 4 2      Figure Recall   (8) 4 2      Wechsler Adult Intelligence Scale - IV: Standard/Scaled Score Percentile  Working Memory Index 80 9      Digit Span 7 16          Digit Span Forward 8 25  Digit Span Backward 8 25          Digit Span Sequencing 7 16      Arithmetic 6 9  Processing Speed Index 100 50      Symbol Search 12 75      Coding 8 25      Neuropsychological Assessment Battery (Language Module): T-score Percentile      Naming   (26) 28 2      Verbal Fluency: T-score Percentile      Controlled Oral Word Association (F-A-S) 54 66      Semantic Fluency (Animals) 54 66      Trail Making Test: T-Score Percentile      Part A 49 46      Part B 45 31      Modified [place] Card Sorting Test (MWCST): Standard/T-Score Percentile      Number of Categories Correct 56 73      Number of Perseverative Errors 62 88      Number of Total Errors 57 75      Percent Perseverative Errors 60 84  Executive Function Composite 115 84      Boston Diagnostic Aphasia Exam: Raw Score Scaled Score      Complex Ideational Material 7 2      Rating Scales         Raw Score Descriptor  Patient Health Questionnaire - 9 14 Moderate  GAD-7 9 Mild   Ladonya Jerkins V. Nicole Kindred PsyD, [place] Clinical Neuropsychologist
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
## 2020-12-10 NOTE — Progress Notes (Signed)
Pt advised of her EMG results.

## 2020-12-11 ENCOUNTER — Encounter: Payer: Self-pay | Admitting: Counselor

## 2020-12-11 ENCOUNTER — Ambulatory Visit (INDEPENDENT_AMBULATORY_CARE_PROVIDER_SITE_OTHER): Payer: Medicaid Other | Admitting: Counselor

## 2020-12-11 ENCOUNTER — Other Ambulatory Visit: Payer: Self-pay

## 2020-12-11 DIAGNOSIS — F418 Other specified anxiety disorders: Secondary | ICD-10-CM

## 2020-12-11 NOTE — Patient Instructions (Signed)
Your performance and presentation on neuropsychological assessment were consistent with normal performance. That is, I think that all the scores you generated are normal for you considering your pre-existing pattern of cognitive strengths and weaknesses. This is good! This suggests that compared to other individuals your age, you are capable of normal levels of performance. I think that your day-to-day cognitive problems are likely caused by other factors, such as depression, insufficient sleep and the like.   First, I would like to reassure you regarding your head injury. When an individual strikes their head and experiences an alteration of consciousness or a brief loss of consciousness without complications like a brain bleed, this is called a concussion or "mild traumatic brain injury." This is not a brain injury per se in terms of causing structural damage to the brain, but it does negatively impact the brain's functioning in terms of energy metabolism (i.e, it is a metabolic injury). Most people experience cognitive symptoms following a concussion, and these typically resolve within hours to days. The vast majority of individuals recover completely in a matter of time, without any measurable lasting cognitive impairments. A small minority of people may experience persisting symptoms, the so-called "postconcussion syndrome," and there is debate about why this is the case. Oftentimes, the symptoms that people report are things that happen with a relatively high frequency in the general population (e.g., things like irritability, difficulties concentrating, and headaches). These things are also associated with depression and anxiety, which are two of the biggest risk factors risk factors for postconcussion syndrome. Things like changes in routines, pain related to headaches, difficulties readjusting to work and normal activities, financial stress, depression, and anxiety about full recovery may be more  important precipitants of postconcussive symptoms than the brain injury itself. My best recommendation is to resume normal levels of activity as tolerated after you are medically cleared to do so and not to overly focus on cognitive problems, which will only make them worse by distracting you and detracting attention from the task at hand.    Second, I suggest that you seek treatment for depression. Depression can affect cognitive functioning in several ways. For one, there are neurobiological changes in depression that can contribute to attention, concentration, and memory problems. These changes are so common they are part of the criteria we use to diagnose depressive disorders. Depression can also negatively impact your own appraisal of your cognitive abilities, leading you to feel like you are performing more poorly than is objectively warranted. Efficacious treatments for depression and anxiety include medication and psychotherapy.   Avoid overfocusing on cognitive performance. Memory and cognition are notoriously fallible and if you are looking for cognitive problems, you are bound to find them. Once someone gets worried about their memory and thinking, they may overfocus on how they are doing day-to-day, and then when normal day-to-day cognitive errors are made, this becomes a cause for more concern. This concern and anxiety then decreases focus from the task at hand, reducing concentration, causing more cognitive problems, and creating a vicious cycle. Rather than critiquing your performance, I would encourage you to remain present minded and focus on the task at hand. Perhaps most importantly, have reasonable expectations for yourself.  Healthy people forget things, lose focus, and do not perform 100% correctly all the time. Some cognitive errors are normal and are not necessarily a sign that there is something wrong with your brain.    There are few things as disruptive to brain functioning as not  getting a good night's sleep. For sleep, I recommend against using medications, which can have lingering sedating effects on the brain and rob your brain of restful REM sleep. Instead, consider trying some of the following sleep hygiene recommendations. They may not work at once and may take effort, but the effort you spend is likely to be rewarded with better sleep eventually:   Stick to a sleep schedule of the same bedtime and wake time even on the weekends, which can help to regulate your body's internal clock so that you fall asleep and stay asleep.  Practice a relaxing bedtime ritual (conducted away from bright lights) which will help separate your sleep from stimulating activities and prepare your body to fall asleep when you go to bed.  Avoid naps, especially in the afternoon.  Evaluate your room and create conditions that will promote sleep such as keeping it cool (between 60 - 67 degrees), quiet, and free from any lights. Consider using blackout curtains, a "white noise" generator, or fan that will help mask any noises that might prevent you from going to sleep or awaken you during the night.  Sleep on a comfortable mattress and pillows.  Avoid bright light in the evening and excessive use of portable electronic devices right before bed that may contain light frequencies that can contribute to sleep problems.  Avoid alcohol, cigarettes, or heavy meals in the evening. If you must eat, consume a light snack 45 minutes before bed.  Use your bed only for sleep to strengthen the association between your bed and sleep.  If you can't go to sleep within 30 minutes, go into another room and do something relaxing until you feel tired. Then, come back and try to go to sleep again for 30 minutes and repeat until sleep is achieved.  Some people find over the counter melatonin to be helpful for sleep, which you could discuss with a pharmacist or prescribing provider.

## 2020-12-11 NOTE — Progress Notes (Signed)
NEUROPSYCHOLOGY FEEDBACK NOTE [facility]  Feedback Note: I met with Latoya Guzman to review the findings resulting from her neuropsychological evaluation. Since the last appointment, she has been about the same. She continues to have memory problems and said that she had to go back in the house "10 times" this morning because of forgetting things. She also did not remember watching a movie with her son and had several similar examples. I provided her with psychoeducation about mTBI, and we discussed the fact that latent affective issues are a frequent source of cognitive problems. 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 Time was spent reviewing the impressions and recommendations that are detailed in the evaluation report. We discussed other topics, as reflected in the patient instructions. I took time to explain the findings and answer all the patient's questions. I encouraged Latoya Guzman to contact me should she have any further questions or if further follow up is desired.   Current Medications and Medical History   Current Outpatient Medications  Medication Sig Dispense Refill   albuterol (VENTOLIN HFA) 108 (90 Base) MCG/ACT inhaler Inhale 2 puffs into the lungs every 4 (four) hours as needed for wheezing or shortness of breath. 18 g 0   brompheniramine-pseudoephedrine-DM 30-2-10 MG/5ML syrup Take 5 mLs by mouth 4 (four) times daily as needed. 120 mL 0   Cetirizine HCl 10 MG CAPS Take 1 capsule (10 mg total) by mouth daily for 10 days. 10 capsule 0   cyclobenzaprine (FLEXERIL) 10 MG tablet Take 1 tablet (10 mg total) by mouth 2 (two) times daily as needed for muscle spasms. 20 tablet 0   dihydroergotamine (MIGRANAL) 4 MG/ML nasal spray Place 1 spray into the nose as needed for migraine. Use in one nostril as directed.  No more than 4 sprays in one hour  (Patient taking differently: Place 1 spray into the nose as needed for migraine. Use in one nostril as directed.  No more than 4 sprays in one hour) 8 mL 12   DULoxetine (CYMBALTA) 30 MG capsule Take 30 mg daily for one week, then 60 mg     Erenumab-aooe (AIMOVIG) 70 MG/ML SOAJ Inject 70 mg into the skin every 28 (twenty-eight) days. 1 pen 11   erythromycin ophthalmic ointment Place a 1/2 inch ribbon of ointment into the lower eyelid twice daily as needed. 3.5 g 0   ferrous sulfate 324 (65 Fe) MG TBEC Take 1 tablet (324 mg total) by mouth in the morning and at bedtime. 180 tablet 3   fluticasone (FLONASE) 50 MCG/ACT nasal spray Place 2 sprays into both nostrils daily. 16 g 6   ibuprofen (ADVIL) 600 MG tablet Take 1 tablet (600 mg total) by mouth every 8 (eight) hours as needed. 30 tablet 0   meloxicam (MOBIC) 15 MG tablet Take 1 tablet (15 mg total) by mouth daily. 30 tablet 1   methylPREDNISolone (MEDROL DOSEPAK) 4 MG TBPK tablet 6 day dose pack - take as directed 21 tablet 0   metoprolol tartrate (LOPRESSOR) 100 MG tablet Take 1 tablet (100 mg total) two hours prior to CT scan. 1 tablet 0   naproxen (NAPROSYN) 500 MG tablet Take 1 tablet (500 mg total) by mouth 2 (two) times daily as needed for mild pain, moderate pain or headache. 30 tablet 0   nortriptyline (PAMELOR) 10 MG capsule Take 1 capsule (10  mg total) by mouth at bedtime. 30 capsule 3   predniSONE (DELTASONE) 20 MG tablet Take 2 tablets (40 mg total) by mouth daily with breakfast. 6 tablet 0   Rimegepant Sulfate (NURTEC) 75 MG TBDP Take 1 tablet by mouth daily as needed (maximum 1 tablet in 24 hours). 8 tablet 5   topiramate (TOPAMAX) 50 MG tablet Take 50 mg by mouth daily as needed (headaches).     No current facility-administered medications for this visit.    Patient Active Problem List   Diagnosis Date Noted   Soft tissue mass 06/09/2020   Allergic cough 06/09/2020   Chronic idiopathic constipation 06/09/2020   Paresthesia  01/20/2020   Routine screening for STI (sexually transmitted infection) 09/26/2019   Bilateral leg edema 09/26/2019   Foot pain, left 07/10/2019   Migraine without aura and without status migrainosus, not intractable 08/10/2018   Mallet finger of right hand 09/23/2016   Menorrhagia 01/09/2015   Pica    Iron deficiency anemia    Chest pain 12/19/2012   Depression 03/26/2011   Antiphospholipid antibody positive 08/25/2007    Mental Status and Behavioral Observations  Latoya Guzman presented on time to the present encounter and was alert and generally oriented. Speech was normal in rate, rhythm, volume, and prosody. Self-reported mood was "ok" and affect was neutral. Thought process was logical and goal oriented and thought content was appropriate to the topics discussed. There were no safety concerns identified at today's encounter, such as thoughts of harming self or others.   Plan  Feedback provided regarding the patient's neuropsychological evaluation. Patient likely has memory difficulties due to a combination of latent affective issues and sleep problems. Referral to psychology placed. Latoya Guzman was encouraged to contact me if any questions arise or if further follow up is desired.   Viviano Simas Nicole Kindred, PsyD, ABN Clinical Neuropsychologist  Service(s) Provided at This Encounter: 30 minutes [phone]; Psychotherapy with patient/family)

## 2020-12-24 ENCOUNTER — Ambulatory Visit (INDEPENDENT_AMBULATORY_CARE_PROVIDER_SITE_OTHER): Payer: Medicaid Other | Admitting: Family Medicine

## 2020-12-24 ENCOUNTER — Other Ambulatory Visit: Payer: Self-pay

## 2020-12-24 VITALS — BP 113/65 | HR 71 | Temp 98.4°F

## 2020-12-24 DIAGNOSIS — R058 Other specified cough: Secondary | ICD-10-CM | POA: Diagnosis not present

## 2020-12-24 MED ORDER — FLUTICASONE PROPIONATE 50 MCG/ACT NA SUSP: 2.0000 | Freq: Every day | NASAL | 0 refills | Status: DC

## 2020-12-24 MED ORDER — CETIRIZINE HCL 10 MG PO CAPS: 10.0000 mg | ORAL_CAPSULE | Freq: Every day | ORAL | 1 refills | Status: DC

## 2020-12-24 MED ORDER — FAMOTIDINE 20 MG PO TABS: 20.0000 mg | ORAL_TABLET | Freq: Two times a day (BID) | ORAL | 1 refills | Status: DC

## 2020-12-24 MED ORDER — FERROUS SULFATE 324 (65 FE) MG PO TBEC: 324.0000 mg | DELAYED_RELEASE_TABLET | Freq: Two times a day (BID) | ORAL | 3 refills | Status: DC

## 2020-12-24 NOTE — Progress Notes (Addendum)
    SUBJECTIVE:   CHIEF COMPLAINT / HPI: cough  Primary symptom:cough Duration: 10 months Severity:worsening  Associated symptoms:clear mucus, tickle in back of throat, cough worse with strong scents Fever? Tmax?: no Sick contacts:no Covid test:no Covid vaccination(s): no   PERTINENT  PMH / PSH:  None  OBJECTIVE:   BP 113/65   Pulse 71   Temp 98.4 F (36.9 C) (Oral)   SpO2 100%    General: Alert, no acute distress Cardio: Normal S1 and S2, RRR, no r/m/g Pulm: CTAB, normal work of breathing Abdomen: Bowel sounds normal. Abdomen soft and non-tender.  Extremities: No peripheral edema.    ASSESSMENT/PLAN:   Allergic cough Has not been compliant with Cetirizine or Flonase.  Less likely viral or bacterial etiology given no fevers, cough stimulated by strong scents and normal lung exam today. -Refer Allergist -Refill Zyrtec, Flonase -Trial Pepcid 20 mg BID  -Follow up if symptoms do not improve     Carollee Leitz, MD [facility]

## 2020-12-24 NOTE — Patient Instructions (Signed)
Thank you for coming to see me today. It was a pleasure.   Take your allergy medication daily Referral sent to allergy specialist   Please follow-up with PCP for PAP smear  If you have any questions or concerns, please do not hesitate to call the office at [phone].  Best,   Carollee Leitz, MD

## 2020-12-26 ENCOUNTER — Encounter: Payer: Self-pay | Admitting: Family Medicine

## 2020-12-26 NOTE — Assessment & Plan Note (Signed)
Has not been compliant with Cetirizine or Flonase.  Less likely viral or bacterial etiology given no fevers, cough stimulated by strong scents and normal lung exam today. -Refer Allergist -Refill Zyrtec, Flonase -Trial Pepcid 20 mg BID  -Follow up if symptoms do not improve

## 2021-01-13 DIAGNOSIS — Z7251 High risk heterosexual behavior: Secondary | ICD-10-CM | POA: Diagnosis not present

## 2021-01-13 DIAGNOSIS — Z131 Encounter for screening for diabetes mellitus: Secondary | ICD-10-CM | POA: Diagnosis not present

## 2021-01-13 DIAGNOSIS — N949 Unspecified condition associated with female genital organs and menstrual cycle: Secondary | ICD-10-CM | POA: Diagnosis not present

## 2021-01-13 DIAGNOSIS — R5383 Other fatigue: Secondary | ICD-10-CM | POA: Diagnosis not present

## 2021-01-13 DIAGNOSIS — Z13220 Encounter for screening for lipoid disorders: Secondary | ICD-10-CM | POA: Diagnosis not present

## 2021-01-13 DIAGNOSIS — E559 Vitamin D deficiency, unspecified: Secondary | ICD-10-CM | POA: Diagnosis not present

## 2021-01-13 DIAGNOSIS — R1084 Generalized abdominal pain: Secondary | ICD-10-CM | POA: Diagnosis not present

## 2021-01-13 DIAGNOSIS — Z0000 Encounter for general adult medical examination without abnormal findings: Secondary | ICD-10-CM | POA: Diagnosis not present

## 2021-01-27 ENCOUNTER — Other Ambulatory Visit: Payer: Self-pay | Admitting: Neurology

## 2021-01-27 DIAGNOSIS — E559 Vitamin D deficiency, unspecified: Secondary | ICD-10-CM | POA: Diagnosis not present

## 2021-01-27 DIAGNOSIS — R1084 Generalized abdominal pain: Secondary | ICD-10-CM | POA: Diagnosis not present

## 2021-01-27 DIAGNOSIS — D539 Nutritional anemia, unspecified: Secondary | ICD-10-CM | POA: Diagnosis not present

## 2021-01-27 DIAGNOSIS — Z23 Encounter for immunization: Secondary | ICD-10-CM | POA: Diagnosis not present

## 2021-02-26 ENCOUNTER — Other Ambulatory Visit: Payer: Self-pay

## 2021-02-26 ENCOUNTER — Ambulatory Visit (INDEPENDENT_AMBULATORY_CARE_PROVIDER_SITE_OTHER): Payer: Medicaid Other | Admitting: Allergy & Immunology

## 2021-02-26 VITALS — BP 110/70 | HR 74 | Temp 97.3°F | Resp 16 | Ht 71.0 in | Wt 255.6 lb

## 2021-02-26 DIAGNOSIS — K219 Gastro-esophageal reflux disease without esophagitis: Secondary | ICD-10-CM

## 2021-02-26 DIAGNOSIS — J310 Chronic rhinitis: Secondary | ICD-10-CM

## 2021-02-26 DIAGNOSIS — R053 Chronic cough: Secondary | ICD-10-CM | POA: Diagnosis not present

## 2021-02-26 NOTE — Progress Notes (Signed)
NEW PATIENT  Date of Service/Encounter:  02/26/21  Consult requested by: Carollee Leitz, MD   Assessment:   Chronic cough   Chronic rhinitis  GERD - starting PPI today   The etiology of Latoya Guzman's cough is not apparent.  She reports that the albuterol never helped with her cough and this is borne out today with no improvement with her spirometry with the albuterol treatment.  Her spirometry is pretty terrible, with the numbers in the 50% range.  Prednisone has not helped in the past either, per the patient.  I recommend that this is asthma at all.  We are going to treat possible silent reflux with omeprazole twice daily and see her back in 4 to 6 weeks.  We could consider further imaging at that point (we are ordering a CXR today).  She has no occupational exposure that would explain any interstitial lung disease.  She does report that smells seem to make the cough worse, which brings up paradoxical vocal fold motion versus reactive airway dysfunction syndrome.  The latter is a diagnosis of exclusion essentially on the former would need an ENT evaluation.  We could consider an ENT referral at the next visit as well if there is no improvement.   Plan/Recommendations:   1. Chronic cough - Lung testing showed values of 50% and it did NOT improve with the albuterol treatment. - I do not think that this is asthma. - We are going to aggressively treat any silent reflux with omeprazole 40 mg twice daily. - Do this for 4 to 6 weeks and we will see how the cough is going at that point.  2. Chronic rhinitis - Testing today showed: negative to the entire panel - Copy of test results provided.  - Avoidance measures provided. - Continue taking: Flonase (fluticasone) one spray per nostril daily - Start taking: Zyrtec (cetirizine) 10mg  tablet once daily - You can use an extra dose of the antihistamine, if needed, for breakthrough symptoms.  - Consider nasal saline rinses 1-2 times daily to  remove allergens from the nasal cavities as well as help with mucous clearance (this is especially helpful to do before the nasal sprays are given)  3. Return in about 6 weeks (around 04/09/2021).    This note in its entirety was forwarded to the Provider who requested this consultation.  Subjective:   Latoya Guzman is a 45 y.o. female presenting today for evaluation of  Chief Complaint  Patient presents with   Cough    Patient in today for a cough she has had since November 2021. She has the most trouble sleeping and when she is hot. Noticed it also gets worse when she eats something with peanuts.    Latoya Guzman has a history of the following: Patient Active Problem List   Diagnosis Date Noted   Soft tissue mass 06/09/2020   Allergic cough 06/09/2020   Chronic idiopathic constipation 06/09/2020   Paresthesia 01/20/2020   Routine screening for STI (sexually transmitted infection) 09/26/2019   Bilateral leg edema 09/26/2019   Foot pain, left 07/10/2019   Migraine without aura and without status migrainosus, not intractable 08/10/2018   Mallet finger of right hand 09/23/2016   Menorrhagia 01/09/2015   Pica    Iron deficiency anemia    Chest pain 12/19/2012   Depression 03/26/2011   Antiphospholipid antibody positive 08/25/2007    History obtained from: chart review and patient.  Latoya Guzman was referred by Carollee Leitz, MD.  Latoya Guzman is a 45 y.o. female presenting for an evaluation of a chronic cough .  Cough started last year in November 2021. She did have COVID prior to the onset of the cough. She actually had it twice before the cough started. She is unsure of the course of the cough. This is a dry cough. There is a clear sputum. Cough can resolve for weeks at a time. Cough is all day long.   She has been on albuterol to use as needed. She started that six months. She is taking it every day. She cannot tell if it helps at all. She stopped it and she did  not notice any difference in her symptoms. Cough was not as bad as it was. Humidity makes it worse. It did seem to be bad in the spring time.   She has noticed that it gets worse when she eats peanut M&Ms. It also happens with a certain granola bar as well. She only eats peanuts in M&Ms. She has issues with a heath bar. She does eat pralines as well.   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 She started that job one month ago. It definitely made the cough worse.   Allergic Rhinitis Symptom History: She does endorse some itchy eyes. She reports some throat itching. She denies postnasal drip.   GERD Symptom History: She does have a history of heart burn. She takes Tums every other day. She was using before eating. She was on Pepcid around two years ago.  She has no heartburn symptoms with the cough.   Otherwise, there is no history of other atopic diseases, including food allergies, drug allergies, stinging insect allergies, eczema, urticaria, or contact dermatitis. There is no significant infectious history. Vaccinations are up to date.    Past Medical History: Patient Active Problem List   Diagnosis Date Noted   Soft tissue mass 06/09/2020   Allergic cough 06/09/2020   Chronic idiopathic constipation 06/09/2020   Paresthesia 01/20/2020   Routine screening for STI (sexually transmitted infection) 09/26/2019   Bilateral leg edema 09/26/2019   Foot pain, left 07/10/2019   Migraine without aura and without status migrainosus, not intractable 08/10/2018   Mallet finger of right hand 09/23/2016   Menorrhagia 01/09/2015   Pica    Iron deficiency anemia    Chest pain 12/19/2012   Depression 03/26/2011   Antiphospholipid antibody positive 08/25/2007    Medication List:  Allergies as of 02/26/2021       Reactions   Sumatriptan Shortness Of Breath, Swelling, Palpitations   Methocarbamol Other (See Comments)   Numbness and tingling in mouth and lips.          Medication List        Accurate as of February 26, 2021 11:59 PM. If you have any questions, ask your nurse or doctor.          STOP taking these medications    Aimovig 70 MG/ML Soaj Generic drug: Erenumab-aooe Stopped by: Valentina Shaggy, MD   cyclobenzaprine 10 MG tablet Commonly known as: FLEXERIL Stopped by: Valentina Shaggy, MD   methylPREDNISolone 4 MG Tbpk tablet Commonly known as: MEDROL DOSEPAK Stopped by: Valentina Shaggy, MD   metoprolol tartrate 100 MG tablet Commonly known as: LOPRESSOR Stopped by: Valentina Shaggy, MD   topiramate 50 MG tablet Commonly known as: TOPAMAX Stopped by: Valentina Shaggy, MD       TAKE these medications    albuterol 108 (90  Base) MCG/ACT inhaler Commonly known as: VENTOLIN HFA Inhale 2 puffs into the lungs every 4 (four) hours as needed for wheezing or shortness of breath.   brompheniramine-pseudoephedrine-DM 30-2-10 MG/5ML syrup Take 5 mLs by mouth 4 (four) times daily as needed.   Cetirizine HCl 10 MG Caps Take 1 capsule (10 mg total) by mouth daily.   dihydroergotamine 4 MG/ML nasal spray Commonly known as: MIGRANAL Place 1 spray into the nose as needed for migraine. Use in one nostril as directed.  No more than 4 sprays in one hour   DULoxetine 30 MG capsule Commonly known as: CYMBALTA Take 30 mg daily for one week, then 60 mg   erythromycin ophthalmic ointment Place a 1/2 inch ribbon of ointment into the lower eyelid twice daily as needed.   famotidine 20 MG tablet Commonly known as: Pepcid Take 1 tablet (20 mg total) by mouth 2 (two) times daily.   ferrous sulfate 324 (65 Fe) MG Tbec Take 1 tablet (324 mg total) by mouth in the morning and at bedtime.   fluticasone 50 MCG/ACT nasal spray Commonly known as: FLONASE Place 2 sprays into both nostrils daily.   ibuprofen 600 MG tablet Commonly known as: ADVIL Take 1 tablet (600 mg total) by mouth every 8 (eight) hours as needed.    meloxicam 15 MG tablet Commonly known as: MOBIC Take 1 tablet (15 mg total) by mouth daily.   naproxen 500 MG tablet Commonly known as: NAPROSYN Take 1 tablet (500 mg total) by mouth 2 (two) times daily as needed for mild pain, moderate pain or headache.   nortriptyline 10 MG capsule Commonly known as: PAMELOR Take 1 capsule (10 mg total) by mouth at bedtime.   Nurtec 75 MG Tbdp Generic drug: Rimegepant Sulfate TAKE 1 TABLET BY MOUTH DAILY AS NEED*MAX 1 TABLET IN 24 HOURS*   predniSONE 20 MG tablet Commonly known as: DELTASONE Take 2 tablets (40 mg total) by mouth daily with breakfast.        Birth History: non-contributory  Developmental History: non-contributory  Past Surgical History: Past Surgical History:  Procedure Laterality Date   CESAREAN SECTION  1998, 2009   CESAREAN SECTION  11/14/2010   Procedure: CESAREAN SECTION;  Surgeon: Juliene Pina C. Hulan Fray, MD;  Location: [facility];  Service: Gynecology;  Laterality: N/A;  Repeat cesarean section with delivery of baby boy at 54. Apgars 9/9. Bilateral tubal ligation with filshie clips.    DILATION AND CURETTAGE OF UTERUS  2005, 2006   1st for twin loss, 2nd for retained placenta   ESOPHAGOGASTRODUODENOSCOPY N/A 01/02/2015   Procedure: ESOPHAGOGASTRODUODENOSCOPY (EGD);  Surgeon: Manus Gunning, MD;  Location: [facility];  Service: Gastroenterology;  Laterality: N/A;   TUBAL LIGATION  11/14/2010     Family History: Family History  Problem Relation Age of Onset   Hypertension Mother    Lupus Sister    Hypertension Father    Diabetes Maternal Uncle    Cancer Maternal Grandmother    Colon cancer Neg Hx      Social History: Jissel lives at home with her family.  They live in an apartment that is 43 years old.  There is laminate flooring throughout the home.  They have electric heating and central cooling.  There are no animals inside or outside of the home.  There are no dust mite covers on the bedding.  There is no  tobacco exposure.  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  She is exposed to fumes, chemicals, and dust.  She does not live near an interstate or industrial area.   Review of Systems  Constitutional: Negative.  Negative for fever, malaise/fatigue and weight loss.  HENT: Negative.  Negative for congestion, ear discharge and ear pain.   Eyes:  Negative for pain, discharge and redness.  Respiratory:  Positive for cough and shortness of breath. Negative for sputum production and wheezing.   Cardiovascular: Negative.  Negative for chest pain and palpitations.  Gastrointestinal:  Negative for abdominal pain, constipation, diarrhea, heartburn, nausea and vomiting.  Skin: Negative.  Negative for itching and rash.  Neurological:  Negative for dizziness and headaches.  Endo/Heme/Allergies:  Negative for environmental allergies. Does not bruise/bleed easily.      Objective:   Blood pressure 110/70, pulse 74, temperature (!) 97.3 F (36.3 C), temperature source Temporal, resp. rate 16, height 5\' 11"  (1.803 m), weight 255 lb 9.6 oz (115.9 kg), SpO2 97 %. Body mass index is 35.65 kg/m.   Physical Exam:   Physical Exam Vitals reviewed.  Constitutional:      Appearance: She is well-developed.     Comments: Intermittent hoarseness.   HENT:     Head: Normocephalic and atraumatic.     Right Ear: Tympanic membrane, ear canal and external ear normal. No drainage, swelling or tenderness. Tympanic membrane is not injected, scarred, erythematous, retracted or bulging.     Left Ear: Tympanic membrane, ear canal and external ear normal. No drainage, swelling or tenderness. Tympanic membrane is not injected, scarred, erythematous, retracted or bulging.     Nose: No nasal deformity, septal deviation, mucosal edema or rhinorrhea.     Right Turbinates: Enlarged, swollen and pale.     Left Turbinates: Enlarged, swollen and pale.     Right Sinus: No  maxillary sinus tenderness or frontal sinus tenderness.     Left Sinus: No maxillary sinus tenderness or frontal sinus tenderness.     Mouth/Throat:     Mouth: Mucous membranes are not pale and not dry.     Pharynx: Uvula midline.  Eyes:     General:        Right eye: No discharge.        Left eye: No discharge.     Conjunctiva/sclera: Conjunctivae normal.     Right eye: Right conjunctiva is not injected. No chemosis.    Left eye: Left conjunctiva is not injected. No chemosis.    Pupils: Pupils are equal, round, and reactive to light.  Cardiovascular:     Rate and Rhythm: Normal rate and regular rhythm.     Heart sounds: Normal heart sounds.  Pulmonary:     Effort: Pulmonary effort is normal. No tachypnea, accessory muscle usage or respiratory distress.     Breath sounds: Normal breath sounds. No wheezing, rhonchi or rales.     Comments: Moving air well in all lung fields. No increased work of breathing noted. Chest:     Chest wall: No tenderness.  Abdominal:     Tenderness: There is no abdominal tenderness. There is no guarding or rebound.  Lymphadenopathy:     Head:     Right side of head: No submandibular, tonsillar or occipital adenopathy.     Left side of head: No submandibular, tonsillar or occipital adenopathy.     Cervical: No cervical adenopathy.  Skin:    Coloration: Skin is not pale.     Findings: No abrasion, erythema, petechiae or rash. Rash is not papular, urticarial or  vesicular.  Neurological:     Mental Status: She is alert.  Psychiatric:        Behavior: Behavior is cooperative.     Diagnostic studies:    Spirometry: results abnormal (FEV1: 1.79/57%, FVC: 2.62/67%, FEV1/FVC: 68%).    Spirometry consistent with mixed obstructive and restrictive disease. There was no improvement with the albuterol treatment, although the FEF25-75% did improve.   Allergy Studies:     Airborne Adult Perc - 02/26/21 0955     Time Antigen Placed 0945    Allergen  Manufacturer Lavella Hammock    Location Back    Number of Test 59    Panel 1 Select    1. Control-Buffer 50% Glycerol Negative    2. Control-Histamine 1 mg/ml 2+    3. Albumin saline Negative    4. Firestone Negative    5. [place] Negative    6. Johnson Negative    7. Angelina Blue Negative    8. Meadow Fescue Negative    9. Perennial Rye Negative    10. Sweet Vernal Negative    11. Timothy Negative    12. Cocklebur Negative    13. Burweed Marshelder Negative    14. Ragweed, short Negative    15. Ragweed, Giant Negative    16. Plantain,  English Negative    17. Lamb's Quarters Negative    18. Sheep Sorrell Negative    19. Rough Pigweed Negative    20. Marsh Elder, Rough Negative    21. Mugwort, Common Negative    22. Ash mix Negative    23. Birch mix Negative    24. Beech American Negative    25. Box, Elder Negative    26. Cedar, red Negative    27. Cottonwood, [place] Negative    28. Elm mix Negative    29. Hickory Negative    30. Maple mix Negative    31. Oak, [place] mix Negative    32. Pecan Pollen Negative    33. Pine mix Negative    34. Sycamore Eastern Negative    35. [place], Black Pollen Negative    36. Alternaria alternata Negative    37. Cladosporium Herbarum Negative    38. Aspergillus mix Negative    39. Penicillium mix Negative    40. Bipolaris sorokiniana (Helminthosporium) Negative    41. Drechslera spicifera (Curvularia) Negative    42. Mucor plumbeus Negative    43. Fusarium moniliforme Negative    44. Aureobasidium pullulans (pullulara) Negative    45. Rhizopus oryzae Negative    46. Botrytis cinera Negative    47. Epicoccum nigrum Negative    48. Phoma betae Negative    49. Candida Albicans Negative    50. Trichophyton mentagrophytes Negative    51. Mite, D Farinae  5,000 AU/ml Negative    52. Mite, D Pteronyssinus  5,000 AU/ml Negative    53. Cat Hair 10,000 BAU/ml Negative    54.  Dog Epithelia Negative    55. Mixed Feathers Negative    56. Horse  Epithelia Negative    57. Cockroach, German Negative    58. Mouse Negative    59. Tobacco Leaf Negative             Food Adult Perc - 02/26/21 0900     Time Antigen Placed 0945     Control-buffer 50% Glycerol Negative    Control-Histamine 1 mg/ml 2+    1. Peanut Negative    10. Cashew Negative    11. [place]  Negative    12. [place] Negative    13. Almond Negative    14. Hazelnut Negative    15. [place] nut Negative    16. Coconut Negative    17. Pistachio Negative             Allergy testing results were read and interpreted by myself, documented by clinical staff.         Salvatore Marvel, MD [facility]

## 2021-02-26 NOTE — Patient Instructions (Addendum)
1. Chronic cough - Lung testing showed values of 50% and it did NOT improve with the albuterol treatment. - I do not think that this is asthma. - We are going to aggressively treat any silent reflux with omeprazole 40 mg twice daily. - Do this for 4 to 6 weeks and we will see how the cough is going at that point.  2. Chronic rhinitis - Testing today showed: negative to the entire panel - Copy of test results provided.  - Avoidance measures provided. - Continue taking: Flonase (fluticasone) one spray per nostril daily - Start taking: Zyrtec (cetirizine) 10mg  tablet once daily - You can use an extra dose of the antihistamine, if needed, for breakthrough symptoms.  - Consider nasal saline rinses 1-2 times daily to remove allergens from the nasal cavities as well as help with mucous clearance (this is especially helpful to do before the nasal sprays are given)  3. Return in about 6 weeks (around 04/09/2021).    Please inform us of any Emergency Department visits, hospitalizations, or changes in symptoms. Call us before going to the ED for breathing or allergy symptoms since we might be able to fit you in for a sick visit. Feel free to contact us anytime with any questions, problems, or concerns.  It was a pleasure to meet you today!  Websites that have reliable patient information: 1. American Academy of Asthma, Allergy, and Immunology: www.aaaai.org 2. Food Allergy Research and Education (FARE): foodallergy.org 3. Mothers of Asthmatics: http://www.asthmacommunitynetwork.org 4. American College of Allergy, Asthma, and Immunology: www.acaai.org   COVID-19 Vaccine Information can be found at: [email or web address].[email or web address].uk For questions related to vaccine distribution or appointments, please email [email or web address] or call [phone].   We realize that you might be concerned about having an allergic reaction to the COVID19 vaccines. To  help with that concern, WE ARE OFFERING THE COVID19 VACCINES IN OUR OFFICE! Ask the front desk for dates!     "Like" [place] on Facebook and Instagram for our latest updates!      A healthy democracy works best when [employer] participate! Make sure you are registered to vote! If you have moved or changed any of your contact information, you will need to get this updated before voting!  In some cases, you MAY be able to register to vote online: [email or web address]    EARLY VOTING HAS STARTED! If you still need to register to vote, you can do this and cast a ballot at any of the early voting locations!       [facility] Adult Perc - 02/26/21 0955     Time Antigen Placed 0945    Allergen Manufacturer Lavella Hammock    Location Back    Number of Test 59    Panel 1 Select    1. Control-Buffer 50% Glycerol Negative    2. Control-Histamine 1 mg/ml 2+    3. Albumin saline Negative    4. [place] Negative    5. [place] Negative    6. Johnson Negative    7. Concord Blue Negative    8. Meadow Fescue Negative    9. Perennial Rye Negative    10. Sweet Vernal Negative    11. Timothy Negative    12. Cocklebur Negative    13. Burweed Marshelder Negative    14. Ragweed, short Negative    15. Ragweed, Giant Negative    16. Plantain,  English Negative    17. Lamb's Quarters Negative    18. Sheep Sorrell Negative    19.  Rough Pigweed Negative    20. Marsh Elder, Rough Negative    21. Mugwort, Common Negative    22. Ash mix Negative    23. Birch mix Negative    24. Beech American Negative    25. Box, Elder Negative    26. Cedar, red Negative    27. Cottonwood, [place] Negative    28. Elm mix Negative    29. Hickory Negative    30. Maple mix Negative    31. Oak, [place] mix Negative    32. Pecan Pollen Negative    33. Pine mix Negative    34. Sycamore Eastern Negative    35. [place], Black Pollen Negative    36. Alternaria alternata Negative    37. Cladosporium  Herbarum Negative    38. Aspergillus mix Negative    39. Penicillium mix Negative    40. Bipolaris sorokiniana (Helminthosporium) Negative    41. Drechslera spicifera (Curvularia) Negative    42. Mucor plumbeus Negative    43. Fusarium moniliforme Negative    44. Aureobasidium pullulans (pullulara) Negative    45. Rhizopus oryzae Negative    46. Botrytis cinera Negative    47. Epicoccum nigrum Negative    48. Phoma betae Negative    49. Candida Albicans Negative    50. Trichophyton mentagrophytes Negative    51. Mite, D Farinae  5,000 AU/ml Negative    52. Mite, D Pteronyssinus  5,000 AU/ml Negative    53. Cat Hair 10,000 BAU/ml Negative    54.  Dog Epithelia Negative    55. Mixed Feathers Negative    56. Horse Epithelia Negative    57. Cockroach, German Negative    58. Mouse Negative    59. Tobacco Leaf Negative             Food Adult Perc - 02/26/21 0900     Time Antigen Placed 0945     Control-buffer 50% Glycerol Negative    Control-Histamine 1 mg/ml 2+    1. Peanut Negative    10. Cashew Negative    11. Pecan Food Negative    12. [place] Negative    13. Almond Negative    14. Hazelnut Negative    15. [place] nut Negative    16. Coconut Negative    17. Pistachio Negative

## 2021-02-27 ENCOUNTER — Telehealth: Payer: Self-pay | Admitting: *Deleted

## 2021-02-27 ENCOUNTER — Encounter: Payer: Self-pay | Admitting: Allergy & Immunology

## 2021-02-27 NOTE — Telephone Encounter (Signed)
Called and left a voicemail asking for patient to return call to discuss details of ordered Chest xray

## 2021-02-27 NOTE — Addendum Note (Signed)
Addended by: Clovis Cao A on: 02/27/2021 02:22 PM   Modules accepted: Orders

## 2021-02-27 NOTE — Telephone Encounter (Signed)
-----   Message from Valentina Shaggy, MD sent at 02/27/2021  9:10 AM EDT ----- Can someone call the patient to let her know that I ordered a CXR? I did not see a recent one and wanted to check for any lung issues. I ordered for [place].

## 2021-03-02 NOTE — Telephone Encounter (Signed)
Attempted to call patient and was sent to voicemail. Left voicemail asking for patient to return call to discuss.

## 2021-03-04 NOTE — Telephone Encounter (Signed)
Called and left a detailed voicemail advising patient that she can go to [facility] to get chest x ray done any time during their open hours.

## 2021-03-04 NOTE — Telephone Encounter (Signed)
Patient was retuning a nurse call to discuss when to go for the chest x-ray. [phone].

## 2021-03-10 DIAGNOSIS — R1084 Generalized abdominal pain: Secondary | ICD-10-CM | POA: Diagnosis not present

## 2021-03-10 DIAGNOSIS — G43909 Migraine, unspecified, not intractable, without status migrainosus: Secondary | ICD-10-CM | POA: Diagnosis not present

## 2021-03-10 DIAGNOSIS — D539 Nutritional anemia, unspecified: Secondary | ICD-10-CM | POA: Diagnosis not present

## 2021-03-10 DIAGNOSIS — E559 Vitamin D deficiency, unspecified: Secondary | ICD-10-CM | POA: Diagnosis not present

## 2021-04-09 ENCOUNTER — Ambulatory Visit (INDEPENDENT_AMBULATORY_CARE_PROVIDER_SITE_OTHER): Payer: Medicaid Other | Admitting: Allergy & Immunology

## 2021-04-09 ENCOUNTER — Other Ambulatory Visit: Payer: Self-pay

## 2021-04-09 ENCOUNTER — Other Ambulatory Visit: Payer: Self-pay | Admitting: Allergy & Immunology

## 2021-04-09 DIAGNOSIS — K219 Gastro-esophageal reflux disease without esophagitis: Secondary | ICD-10-CM

## 2021-04-09 DIAGNOSIS — J310 Chronic rhinitis: Secondary | ICD-10-CM

## 2021-04-09 DIAGNOSIS — R053 Chronic cough: Secondary | ICD-10-CM | POA: Diagnosis not present

## 2021-04-09 MED ORDER — OMEPRAZOLE 40 MG PO CPDR: 40.0000 mg | DELAYED_RELEASE_CAPSULE | Freq: Two times a day (BID) | ORAL | 5 refills | Status: DC

## 2021-04-09 MED ORDER — FAMOTIDINE 20 MG PO TABS
20.0000 mg | ORAL_TABLET | Freq: Two times a day (BID) | ORAL | 1 refills | Status: DC
Start: 2021-04-09 — End: 2021-04-10

## 2021-04-09 NOTE — Progress Notes (Signed)
RE: Latoya Guzman MRN: [id number] DOB: 1976-04-06 Date of Telemedicine Visit: 04/09/2021  Referring provider: Carollee Leitz, MD Primary care provider: Carollee Leitz, MD  Chief Complaint: Breathing Problem and Medication Management   Telemedicine Follow Up Visit via Telephone: I connected with Latoya Guzman for a follow up on 04/13/21 by telephone and verified that I am speaking with the correct person using two identifiers.   I discussed the limitations, risks, security and privacy concerns of performing an evaluation and management service by telephone and the availability of in person appointments. I also discussed with the patient that there may be a patient responsible charge related to this service. The patient expressed understanding and agreed to proceed.  Patient is at home.  Provider is at the office.  Visit start time: 6:01 PM Visit end time: 6:22 PM Insurance consent/check in by: Dr. Darnell Level Medical consent and medical assistant/nurse: Dr. Darnell Level  History of Present Illness:  She is a 45 y.o. female, who is being followed for a chronic cough as well as chronic rhinitis. Her previous allergy office visit was in November 2022 with myself.  At that time, environmental allergy testing was negative to the entire panel.  Her lung testing showed values of 50% which did not improve with albuterol.  We decided to aggressively treat silent reflux.  We started omeprazole twice daily.  We did discuss sending her for an ENT evaluation for paradoxical vocal fold motion.  In the interim, she reports that she had a cold last week at work that included vomiting.  She is now feeling much better.  Asthma/Respiratory Symptom History: Her SOB has improved somewhat. She never got the omperazole. She has this when she is going u pa flight of steps or walking a long distnace. She has to stop and try to breathe. She is working at a job that is physically activity. She is working in the freezer part of a  warehouse. She reports that she is having "heart fluttering". Now she is feeling that her heart is "not pumping".  She has had an EKG in the past, earlier this year. She had a normal echocardiogram in January 2022.   She had a Halter monitor in 2006 and 2021. She was evaluated by Dr. Radford Pax in December 2021 who felt that her chest pain at the time with musculoskeletal in nature.  An EKG at that time was normal.  She participated in the IDENTIFY trial in Cardiology. Per the website: "This study will compare a non-invasive point of care test developed using surface electrodes (like an EKG) and machine learning and signal analytics (artificial intelligence) to transform early detection of heart disease. This will include patients referred for clinically indicated SPECT imaging, coronary CTA, and coronary angiographic to develop the machine learned algorithms."  She denies that this is related to panic attacks. She thinks that a lot of this has to do with her work environment. She can be there for 30 to 45 minutes before it starts. 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   She has tried albuterol but it was not helping much at all.   GERD Symptom History: She has a lot of heart burn symptoms. She is going to go pick up the prescription we sent in this time.   Otherwise, there have been no changes to her past medical history, surgical history, family history, or social history.  Assessment and Plan:  [name] is a 45 y.o. female with:  Chronic  cough    Chronic rhinitis   GERD - likely contributing to this chronic cough  SLE with positive phospholipid antibody  Sickle cell trait   We are going to go ahead and send in the reflux medication once again.  She is going to go pick that up.  Hopefully controlling the reflux will help with the cough.  We are going to see her again and do a spirometry at that time.  Hopefully that is improved from the 50% that we saw at  the last visit.  It is reassuring that she has already had a negative cardiology work-up.  Diagnostics: None.  Medication List:  Current Outpatient Medications  Medication Sig Dispense Refill   omeprazole (PRILOSEC) 40 MG capsule Take 1 capsule (40 mg total) by mouth in the morning and at bedtime. 60 capsule 5   albuterol (VENTOLIN HFA) 108 (90 Base) MCG/ACT inhaler Inhale 2 puffs into the lungs every 4 (four) hours as needed for wheezing or shortness of breath. 18 g 0   brompheniramine-pseudoephedrine-DM 30-2-10 MG/5ML syrup Take 5 mLs by mouth 4 (four) times daily as needed. 120 mL 0   Cetirizine HCl 10 MG CAPS Take 1 capsule (10 mg total) by mouth daily. 30 capsule 1   dihydroergotamine (MIGRANAL) 4 MG/ML nasal spray Place 1 spray into the nose as needed for migraine. Use in one nostril as directed.  No more than 4 sprays in one hour (Patient taking differently: Place 1 spray into the nose as needed for migraine. Use in one nostril as directed.  No more than 4 sprays in one hour) 8 mL 12   DULoxetine (CYMBALTA) 30 MG capsule Take 30 mg daily for one week, then 60 mg     famotidine (PEPCID) 20 MG tablet TAKE 1 TABLET(20 MG) BY MOUTH TWICE DAILY 180 tablet 1   ferrous sulfate 324 (65 Fe) MG TBEC Take 1 tablet (324 mg total) by mouth in the morning and at bedtime. 180 tablet 3   fluticasone (FLONASE) 50 MCG/ACT nasal spray Place 2 sprays into both nostrils daily. 16 g 0   NURTEC 75 MG TBDP TAKE 1 TABLET BY MOUTH DAILY AS NEED*MAX 1 TABLET IN 24 HOURS* 8 tablet 1   No current facility-administered medications for this visit.   Allergies: Allergies  Allergen Reactions   Sumatriptan Shortness Of Breath, Swelling and Palpitations   Methocarbamol Other (See Comments)    Numbness and tingling in mouth and lips.    I reviewed her past medical history, social history, family history, and environmental history and no significant changes have been reported from previous visits.  Review of Systems   Constitutional:  Negative for activity change, appetite change, chills, diaphoresis, fatigue and fever.  HENT:  Negative for congestion, postnasal drip, rhinorrhea, sinus pressure and sore throat.   Eyes:  Negative for pain, discharge, redness and itching.  Respiratory:  Negative for shortness of breath, wheezing and stridor.   Gastrointestinal:  Negative for diarrhea, nausea and vomiting.  Endocrine: Negative for cold intolerance and heat intolerance.  Musculoskeletal:  Negative for arthralgias, joint swelling and myalgias.  Skin:  Negative for rash.  Allergic/Immunologic: Negative for environmental allergies and food allergies.   Objective:  Physical exam not obtained as encounter was done via telephone.   Previous notes and tests were reviewed.  I discussed the assessment and treatment plan with the patient. The patient was provided an opportunity to ask questions and all were answered. The patient agreed with the plan and  demonstrated an understanding of the instructions.   The patient was advised to call back or seek an in-person evaluation if the symptoms worsen or if the condition fails to improve as anticipated.  I provided 21 minutes of non-face-to-face time during this encounter.  It was my pleasure to participate in [place] care today. Please feel free to contact me with any questions or concerns.   Sincerely,  Valentina Shaggy, MD

## 2021-04-13 ENCOUNTER — Encounter: Payer: Self-pay | Admitting: Allergy & Immunology

## 2021-05-01 ENCOUNTER — Telehealth: Payer: Self-pay

## 2021-05-01 NOTE — Telephone Encounter (Signed)
F/u  Latoya Guzman (Key: [id number]) Nurtec 75MG  dispersible tablets   Form [employer] Electronic Prior Authorization Form (2017 NCPDP) Created 1 hour ago Sent to Plan 1 hour ago Plan Response 1 hour ago Submit Clinical Questions 1 hour ago Determination Favorable 38 minutes ago Message from Plan Request Reference Number: [id number]. NURTEC TAB 75MG  ODT is approved through 05/01/2022. For further questions, call [employer] at [phone].

## 2021-05-01 NOTE — Telephone Encounter (Signed)
New message   Latoya Guzman (Key: [id number]) Nurtec 75MG  dispersible tablets   Form [employer] Electronic Prior Authorization Form (2017 NCPDP) Created 19 minutes ago Sent to Plan 16 minutes ago Plan Response 16 minutes ago Submit Clinical Questions less than a minute ago Determination Wait for Determination Please wait for [employer] 2017 NCPDP to return a determination.

## 2021-05-08 DIAGNOSIS — G43909 Migraine, unspecified, not intractable, without status migrainosus: Secondary | ICD-10-CM | POA: Diagnosis not present

## 2021-05-08 DIAGNOSIS — D539 Nutritional anemia, unspecified: Secondary | ICD-10-CM | POA: Diagnosis not present

## 2021-05-08 DIAGNOSIS — R0602 Shortness of breath: Secondary | ICD-10-CM | POA: Diagnosis not present

## 2021-05-08 DIAGNOSIS — E559 Vitamin D deficiency, unspecified: Secondary | ICD-10-CM | POA: Diagnosis not present

## 2021-05-08 DIAGNOSIS — Z6834 Body mass index (BMI) 34.0-34.9, adult: Secondary | ICD-10-CM | POA: Diagnosis not present

## 2021-05-08 DIAGNOSIS — Z3200 Encounter for pregnancy test, result unknown: Secondary | ICD-10-CM | POA: Diagnosis not present

## 2021-05-08 DIAGNOSIS — Z79899 Other long term (current) drug therapy: Secondary | ICD-10-CM | POA: Diagnosis not present

## 2021-05-12 ENCOUNTER — Telehealth: Payer: Self-pay | Admitting: Oncology

## 2021-05-12 NOTE — Telephone Encounter (Signed)
Scheduled appt per 1/17 referral. Pt is aware of appt date and time. Pt is aware to arrive 15 mins prior to appt time.

## 2021-05-21 ENCOUNTER — Ambulatory Visit (INDEPENDENT_AMBULATORY_CARE_PROVIDER_SITE_OTHER): Payer: Medicaid Other | Admitting: Allergy & Immunology

## 2021-05-21 ENCOUNTER — Encounter: Payer: Self-pay | Admitting: Allergy & Immunology

## 2021-05-21 ENCOUNTER — Other Ambulatory Visit: Payer: Self-pay

## 2021-05-21 VITALS — BP 122/78 | HR 91 | Temp 98.0°F | Resp 16

## 2021-05-21 DIAGNOSIS — K219 Gastro-esophageal reflux disease without esophagitis: Secondary | ICD-10-CM

## 2021-05-21 DIAGNOSIS — R053 Chronic cough: Secondary | ICD-10-CM | POA: Diagnosis not present

## 2021-05-21 DIAGNOSIS — J310 Chronic rhinitis: Secondary | ICD-10-CM

## 2021-05-21 MED ORDER — OMEPRAZOLE 40 MG PO CPDR: 40.0000 mg | DELAYED_RELEASE_CAPSULE | Freq: Two times a day (BID) | ORAL | 5 refills | Status: DC

## 2021-05-21 MED ORDER — ALBUTEROL SULFATE HFA 108 (90 BASE) MCG/ACT IN AERS: 2.0000 | INHALATION_SPRAY | RESPIRATORY_TRACT | 1 refills | Status: AC | PRN

## 2021-05-21 NOTE — Patient Instructions (Addendum)
1. Chronic cough - Lung testing was stable, but still not great at all.  - I still do not think that this is asthma. - Get the reflux medication and use it consistently for 4-6 weeks.  - We are going to send you to GI for an evaluation in case you need another endoscopy.   2. Chronic non-allergic rhinitis - Continue taking: Flonase (fluticasone) one spray per nostril daily - Continue taking: Zyrtec (cetirizine) 10mg  tablet once daily - You can use an extra dose of the antihistamine, if needed, for breakthrough symptoms.   3. Return in about 6 months (around 11/18/2021).    Please inform us of any Emergency Department visits, hospitalizations, or changes in symptoms. Call us before going to the ED for breathing or allergy symptoms since we might be able to fit you in for a sick visit. Feel free to contact us anytime with any questions, problems, or concerns.  It was a pleasure to see you again today!  Websites that have reliable patient information: 1. American Academy of Asthma, Allergy, and Immunology: www.aaaai.org 2. Food Allergy Research and Education (FARE): foodallergy.org 3. Mothers of Asthmatics: http://www.asthmacommunitynetwork.org 4. American College of Allergy, Asthma, and Immunology: www.acaai.org   COVID-19 Vaccine Information can be found at: ShippingScam.co.uk For questions related to vaccine distribution or appointments, please email [email or web address] or call [phone].   We realize that you might be concerned about having an allergic reaction to the COVID19 vaccines. To help with that concern, WE ARE OFFERING THE COVID19 VACCINES IN OUR OFFICE! Ask the front desk for dates!     Like [place] on [place] and Instagram for our latest updates!      A healthy democracy works best when [employer] participate! Make sure you are registered to vote! If you have moved or changed any of your contact information,  you will need to get this updated before voting!  In some cases, you MAY be able to register to vote online: [email or web address]

## 2021-05-21 NOTE — Progress Notes (Signed)
FOLLOW UP  Date of Service/Encounter:  05/21/21   Assessment:   Chronic cough    Chronic rhinitis   GERD - likely contributing to this chronic cough   SLE with positive phospholipid antibody   Sickle cell trait  Plan/Recommendations:   1. Chronic cough - Lung testing was stable, but still not great at all.  - I still do not think that this is asthma. - Get the reflux medication and use it consistently for 4-6 weeks.  - We are going to send you to GI for an evaluation in case you need another endoscopy.   2. Chronic non-allergic rhinitis - Continue taking: Flonase (fluticasone) one spray per nostril daily - Continue taking: Zyrtec (cetirizine) 10mg  tablet once daily - You can use an extra dose of the antihistamine, if needed, for breakthrough symptoms.   3. Return in about 6 months (around 11/18/2021).   Subjective:   Latoya Guzman is a 46 y.o. female presenting today for follow up of  Chief Complaint  Patient presents with   Follow-up    Latoya Guzman has a history of the following: Patient Active Problem List   Diagnosis Date Noted   Soft tissue mass 06/09/2020   Allergic cough 06/09/2020   Chronic idiopathic constipation 06/09/2020   Paresthesia 01/20/2020   Routine screening for STI (sexually transmitted infection) 09/26/2019   Bilateral leg edema 09/26/2019   Foot pain, left 07/10/2019   Migraine without aura and without status migrainosus, not intractable 08/10/2018   Mallet finger of right hand 09/23/2016   Menorrhagia 01/09/2015   Pica    Iron deficiency anemia    Chest pain 12/19/2012   Depression 03/26/2011   Antiphospholipid antibody positive 08/25/2007    History obtained from: chart review and patient.  Latoya Guzman is a 46 y.o. female presenting for a follow up visit. She was last seen in December 2022 for a televisit. At that time, she was recovering from a GI bug therefore we did a televisit. Her SOB had improved somewhat. However, she  had not picked up the omeprazole. 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 She had already undergone a cardiac workup which was normal and included a normal echocardiogram in January 2022. We strongly encouraged her to pick up her prescription so that we could see if GERD was contributing to her symptoms.   Since the last visit, she has done OK. Her cough came back a few days ago.   Asthma/Respiratory Symptom History: She has used albuterol and she htinks that this helps with the coughing. She has gone through two inhalers since November 2022. She was using it every day. In the last two weeks, she only used it once but she thinks she needed it more often.  She has not seen a Pulmonologist. She did not have reversibility when we first evaluated. Despite albuterol helping somewhat, prednisone did not really help at all.   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 This has always been a trigger for her symptoms. She has a GED but otherwise no other education. This has limited her marketability, so in a way she feels stuck. She wants to go to school, but she needs to work to make money to go to school.   She have an endoscopy at some point. She has not picked up her omeprazole because she tells me that the pharmacy did not have  the prescription. However, she never called to let us know that.   Otherwise, there have been no changes to her past medical history, surgical history, family history, or social history.    Review of Systems  Constitutional: Negative.  Negative for fever, malaise/fatigue and weight loss.  HENT: Negative.  Negative for congestion, ear discharge and ear pain.   Eyes:  Negative for pain, discharge and redness.  Respiratory:  Positive for cough. Negative for sputum production, shortness of breath and wheezing.   Cardiovascular: Negative.  Negative for chest pain and palpitations.   Gastrointestinal:  Negative for abdominal pain, constipation, diarrhea, heartburn, nausea and vomiting.  Skin: Negative.  Negative for itching and rash.  Neurological:  Negative for dizziness and headaches.  Endo/Heme/Allergies:  Negative for environmental allergies. Does not bruise/bleed easily.      Objective:   Blood pressure 122/78, pulse 91, temperature 98 F (36.7 C), temperature source Temporal, resp. rate 16, SpO2 100 %. There is no height or weight on file to calculate BMI.   Physical Exam:  Physical Exam Vitals reviewed.  Constitutional:      Appearance: She is well-developed.     Comments: Intermittent hoarseness.   HENT:     Head: Normocephalic and atraumatic.     Right Ear: Tympanic membrane, ear canal and external ear normal. No drainage, swelling or tenderness. Tympanic membrane is not injected, scarred, erythematous, retracted or bulging.     Left Ear: Tympanic membrane, ear canal and external ear normal. No drainage, swelling or tenderness. Tympanic membrane is not injected, scarred, erythematous, retracted or bulging.     Nose: No nasal deformity, septal deviation, mucosal edema or rhinorrhea.     Right Turbinates: Enlarged, swollen and pale.     Left Turbinates: Enlarged, swollen and pale.     Right Sinus: No maxillary sinus tenderness or frontal sinus tenderness.     Left Sinus: No maxillary sinus tenderness or frontal sinus tenderness.     Mouth/Throat:     Mouth: Mucous membranes are not pale and not dry.     Pharynx: Uvula midline.  Eyes:     General:        Right eye: No discharge.        Left eye: No discharge.     Conjunctiva/sclera: Conjunctivae normal.     Right eye: Right conjunctiva is not injected. No chemosis.    Left eye: Left conjunctiva is not injected. No chemosis.    Pupils: Pupils are equal, round, and reactive to light.  Cardiovascular:     Rate and Rhythm: Normal rate and regular rhythm.     Heart sounds: Normal heart sounds.   Pulmonary:     Effort: Pulmonary effort is normal. No tachypnea, accessory muscle usage or respiratory distress.     Breath sounds: Normal breath sounds. No wheezing, rhonchi or rales.     Comments: Moving air well in all lung fields. No increased work of breathing noted. Talking without a problem.  Chest:     Chest wall: No tenderness.  Lymphadenopathy:     Head:     Right side of head: No submandibular, tonsillar or occipital adenopathy.     Left side of head: No submandibular, tonsillar or occipital adenopathy.     Cervical: No cervical adenopathy.  Skin:    Coloration: Skin is not pale.     Findings: No abrasion, erythema, petechiae or rash. Rash is not papular, urticarial or vesicular.  Neurological:     Mental Status:  She is alert.  Psychiatric:        Behavior: Behavior is cooperative.     Diagnostic studies:    Spirometry: results abnormal (FEV1: 1.90/61%, FVC: 2.28/59%, FEV1/FVC: 83%).    Spirometry consistent with possible restrictive disease. Overall values are a bit lower than those obtained at the last visit.    Allergy Studies: none         Salvatore Marvel, MD  [facility]

## 2021-05-22 ENCOUNTER — Telehealth: Payer: Self-pay | Admitting: Oncology

## 2021-05-22 NOTE — Telephone Encounter (Signed)
Cancelled 01/31 appointment due to providers request, patient has been called and voicemail was left.

## 2021-05-24 ENCOUNTER — Encounter: Payer: Self-pay | Admitting: Allergy & Immunology

## 2021-05-25 ENCOUNTER — Telehealth: Payer: Self-pay | Admitting: Internal Medicine

## 2021-05-25 NOTE — Telephone Encounter (Signed)
R/s pt's cancelled new hem appt. Called pt, no answer. Left msg with new appt date and time. Requested for pt to call back to confirm appt.

## 2021-05-26 ENCOUNTER — Encounter: Payer: Medicaid Other | Admitting: Oncology

## 2021-05-27 ENCOUNTER — Emergency Department (HOSPITAL_BASED_OUTPATIENT_CLINIC_OR_DEPARTMENT_OTHER): Payer: Medicaid Other

## 2021-05-27 ENCOUNTER — Other Ambulatory Visit: Payer: Self-pay

## 2021-05-27 ENCOUNTER — Emergency Department (HOSPITAL_COMMUNITY): Payer: Medicaid Other

## 2021-05-27 ENCOUNTER — Emergency Department (HOSPITAL_COMMUNITY)
Admission: EM | Admit: 2021-05-27 | Discharge: 2021-05-27 | Disposition: A | Discharge status: Home / Self Care | Payer: Medicaid Other | Attending: Emergency Medicine | Admitting: Emergency Medicine

## 2021-05-27 ENCOUNTER — Encounter (HOSPITAL_COMMUNITY): Payer: Self-pay | Admitting: Emergency Medicine

## 2021-05-27 DIAGNOSIS — M79604 Pain in right leg: Secondary | ICD-10-CM | POA: Insufficient documentation

## 2021-05-27 DIAGNOSIS — R0789 Other chest pain: Secondary | ICD-10-CM | POA: Diagnosis not present

## 2021-05-27 DIAGNOSIS — R2242 Localized swelling, mass and lump, left lower limb: Secondary | ICD-10-CM | POA: Diagnosis not present

## 2021-05-27 DIAGNOSIS — R079 Chest pain, unspecified: Secondary | ICD-10-CM

## 2021-05-27 DIAGNOSIS — R064 Hyperventilation: Secondary | ICD-10-CM | POA: Diagnosis not present

## 2021-05-27 DIAGNOSIS — R0602 Shortness of breath: Secondary | ICD-10-CM

## 2021-05-27 DIAGNOSIS — R1111 Vomiting without nausea: Secondary | ICD-10-CM | POA: Diagnosis not present

## 2021-05-27 DIAGNOSIS — R002 Palpitations: Secondary | ICD-10-CM

## 2021-05-27 DIAGNOSIS — I10 Essential (primary) hypertension: Secondary | ICD-10-CM | POA: Diagnosis not present

## 2021-05-27 DIAGNOSIS — K449 Diaphragmatic hernia without obstruction or gangrene: Secondary | ICD-10-CM | POA: Diagnosis not present

## 2021-05-27 LAB — I-STAT BETA HCG BLOOD, ED (MC, WL, AP ONLY): I-stat hCG, quantitative: <5 m[IU]/mL (ref <5)

## 2021-05-27 LAB — CBC
HCT: 30.1 % — ABNORMAL LOW (ref 36.0–46.0)
Hemoglobin: 9.1 g/dL — ABNORMAL LOW (ref 12.0–15.0)
MCH: 18.3 pg — ABNORMAL LOW (ref 26.0–34.0)
MCHC: 30.2 g/dL (ref 30.0–36.0)
MCV: 60.4 fL — ABNORMAL LOW (ref 80.0–100.0)
Platelets: 312 10*3/uL (ref 150–400)
RBC: 4.98 MIL/uL (ref 3.87–5.11)
RDW: 19.4 % — ABNORMAL HIGH (ref 11.5–15.5)
WBC: 5.1 10*3/uL (ref 4.0–10.5)
nRBC: 0 % (ref 0.0–0.2)

## 2021-05-27 LAB — COMPREHENSIVE METABOLIC PANEL
ALT: 21 U/L (ref 0–44)
AST: 27 U/L (ref 15–41)
Albumin: 4 g/dL (ref 3.5–5.0)
Alkaline Phosphatase: 64 U/L (ref 38–126)
Anion gap: 11 (ref 5–15)
BUN: 13 mg/dL (ref 6–20)
CO2: 21 mmol/L — ABNORMAL LOW (ref 22–32)
Calcium: 9.4 mg/dL (ref 8.9–10.3)
Chloride: 105 mmol/L (ref 98–111)
Creatinine, Ser: 1.22 mg/dL — ABNORMAL HIGH (ref 0.44–1.00)
GFR, Estimated: 56 mL/min — ABNORMAL LOW (ref >60)
Glucose, Bld: 98 mg/dL (ref 70–99)
Potassium: 3.6 mmol/L (ref 3.5–5.1)
Sodium: 137 mmol/L (ref 135–145)
Total Bilirubin: 0.5 mg/dL (ref 0.3–1.2)
Total Protein: 7.6 g/dL (ref 6.5–8.1)

## 2021-05-27 LAB — LIPASE, BLOOD: Lipase: 35 U/L (ref 11–51)

## 2021-05-27 LAB — D-DIMER, QUANTITATIVE: D-Dimer, Quant: 0.77 ug/mL-FEU — ABNORMAL HIGH (ref 0.00–0.50)

## 2021-05-27 LAB — TROPONIN I (HIGH SENSITIVITY)
Troponin I (High Sensitivity): 4 ng/L (ref <18)
Troponin I (High Sensitivity): 3 ng/L (ref <18)

## 2021-05-27 MED ORDER — IOHEXOL 350 MG/ML SOLN
65.0000 mL | Freq: Once | INTRAVENOUS | Status: AC | PRN
  Administered 2021-05-27: 65 mL via INTRAVENOUS

## 2021-05-27 NOTE — ED Provider Notes (Signed)
[facility] EMERGENCY DEPARTMENT Provider Note   CSN: [id number] Arrival date & time: 05/27/21  1248     History  Chief Complaint  Patient presents with   Chest Pain    Latoya Guzman is a 46 y.o. female who presents to the ED today with complaint of sudden onset, constant, shortness of breath that began this morning.  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  When she lifted the object she began feeling short of breath.  She is states that she felt like she had been running.  She states shortly afterwards she began experiencing heart palpitations and then left-sided chest tightness.  She does mention that she had to wait to go into work for about 2 hours today as there was a concern for a chemical leak.  She states she was later told that it was ammonia and she is unsure if this is related.  She states she continues to feel short of breath however the chest tightness has gone away some.  She reports history of antiphospholipid syndrome.  No history of blood clots in the past.  She does mention to me that she feels like her left leg is persistently more swollen than the right however has not had this evaluated.  She denies any worsening swelling of the left lower leg however does report that she has some pain in the right calf.   The history is provided by the patient and medical records.      Home Medications Prior to Admission medications   Medication Sig Start Date End Date Taking? Authorizing Provider  albuterol (VENTOLIN HFA) 108 (90 Base) MCG/ACT inhaler Inhale 2 puffs into the lungs every 4 (four) hours as needed for wheezing or shortness of breath. 05/21/21   Valentina Shaggy, MD  Cetirizine HCl 10 MG CAPS Take 1 capsule (10 mg total) by mouth daily. 12/24/20 02/22/21  Carollee Leitz, MD  DULoxetine (CYMBALTA) 30 MG capsule Take 30 mg daily for one week, then 60 mg 04/02/20   [provider]   famotidine (PEPCID) 20 MG tablet TAKE 1 TABLET(20 MG) BY MOUTH TWICE DAILY 04/10/21   Valentina Shaggy, MD  ferrous sulfate 324 (65 Fe) MG TBEC Take 1 tablet (324 mg total) by mouth in the morning and at bedtime. 12/24/20   Carollee Leitz, MD  fluticasone (FLONASE) 50 MCG/ACT nasal spray Place 2 sprays into both nostrils daily. 12/24/20   Carollee Leitz, MD  NURTEC 75 MG TBDP TAKE 1 TABLET BY MOUTH DAILY AS NEED*MAX 1 TABLET IN 24 HOURS* 01/27/21   Tomi Likens, Adam R, DO  omeprazole (PRILOSEC) 40 MG capsule Take 1 capsule (40 mg total) by mouth in the morning and at bedtime. 05/21/21 06/20/21  Valentina Shaggy, MD      Allergies    Sumatriptan, Methocarbamol, and Morphine and related    Review of Systems   Review of Systems  Constitutional:  Negative for chills, diaphoresis and fever.  Respiratory:  Positive for shortness of breath.   Cardiovascular:  Positive for chest pain, palpitations and leg swelling (chronic LLE).  Gastrointestinal:  Negative for nausea and vomiting.  Neurological:  Negative for syncope.  All other systems reviewed and are negative.  Physical Exam Updated Vital Signs BP 122/69    Pulse 76    Temp 98.8 F (37.1 C) (Oral)    Resp 15    LMP 05/27/2021    SpO2 100%  Physical Exam Vitals and nursing note reviewed.  Constitutional:      Appearance: She is not ill-appearing.  HENT:     Head: Normocephalic and atraumatic.  Eyes:     Conjunctiva/sclera: Conjunctivae normal.  Cardiovascular:     Rate and Rhythm: Normal rate and regular rhythm.     Pulses:          Radial pulses are 2+ on the right side and 2+ on the left side.       Posterior tibial pulses are 2+ on the right side and 2+ on the left side.     Heart sounds: Normal heart sounds.  Pulmonary:     Effort: Pulmonary effort is normal.     Breath sounds: Normal breath sounds. No decreased breath sounds, wheezing, rhonchi or rales.  Abdominal:     Palpations: Abdomen is soft.     Tenderness: There is no  abdominal tenderness.  Musculoskeletal:     Cervical back: Neck supple.     Right lower leg: Tenderness present.     Left lower leg: Edema present.     Comments: Nonpitting edema noted to LLE without calf TTP.  + TTP to right calf without edema.  2+ DP Pulses bilaterally.   Skin:    General: Skin is warm and dry.  Neurological:     Mental Status: She is alert.    ED Results / Procedures / Treatments   Labs (all labs ordered are listed, but only abnormal results are displayed) Labs Reviewed  CBC - Abnormal; Notable for the following components:      Result Value   Hemoglobin 9.1 (*)    HCT 30.1 (*)    MCV 60.4 (*)    MCH 18.3 (*)    RDW 19.4 (*)    All other components within normal limits  COMPREHENSIVE METABOLIC PANEL - Abnormal; Notable for the following components:   CO2 21 (*)    Creatinine, Ser 1.22 (*)    GFR, Estimated 56 (*)    All other components within normal limits  D-DIMER, QUANTITATIVE - Abnormal; Notable for the following components:   D-Dimer, Quant 0.77 (*)    All other components within normal limits  LIPASE, BLOOD  I-STAT BETA HCG BLOOD, ED ([facility], [facility], AP ONLY)  TROPONIN I (HIGH SENSITIVITY)  TROPONIN I (HIGH SENSITIVITY)    EKG None  Radiology DG Chest 2 View  Result Date: 05/27/2021 CLINICAL DATA:  SOB, CP EXAM: CHEST - 2 VIEW COMPARISON:  01/14/2020 FINDINGS: The heart size and mediastinal contours are within normal limits. Both lungs are clear. No visible pleural effusions or pneumothorax. The visualized skeletal structures are unremarkable. IMPRESSION: No evidence of acute cardiopulmonary disease. Electronically Signed   By: Margaretha Sheffield M.D.   On: 05/27/2021 13:37   CT Angio Chest PE W/Cm &/Or Wo Cm  Result Date: 05/27/2021 CLINICAL DATA:  Pulmonary embolism (PE) suspected, positive D-dimer. chest pain and + D-dimer. EXAM: CT ANGIOGRAPHY CHEST WITH CONTRAST TECHNIQUE: Multidetector CT imaging of the chest was performed using the standard  protocol during bolus administration of intravenous contrast. Multiplanar CT image reconstructions and MIPs were obtained to evaluate the vascular anatomy. RADIATION DOSE REDUCTION: This exam was performed according to the departmental dose-optimization program which includes automated exposure control, adjustment of the mA and/or kV according to patient size and/or use of iterative reconstruction technique. CONTRAST:  80mL OMNIPAQUE IOHEXOL 350 MG/ML SOLN COMPARISON:  CT heart 05/27/2020 FINDINGS: Cardiovascular: Satisfactory opacification of the pulmonary arteries  to the segmental level. No evidence of pulmonary embolism. Normal heart size. No significant pericardial effusion. The thoracic aorta is normal in caliber. No atherosclerotic plaque of the thoracic aorta. No coronary artery calcifications. Mediastinum/Nodes: No enlarged mediastinal, hilar, or axillary lymph nodes. Thyroid gland, trachea, and esophagus demonstrate no significant findings. Tiny hiatal hernia. Lungs/Pleura: No focal consolidation. No pulmonary nodule. No pulmonary mass. No pleural effusion. No pneumothorax. Upper Abdomen: No acute abnormality. Musculoskeletal: No chest wall abnormality. No suspicious lytic or blastic osseous lesions. No acute displaced fracture. Dextrocurvature of the midthoracic spine. Review of the MIP images confirms the above findings. IMPRESSION: 1. No pulmonary embolus. 2. No acute intrathoracic abnormality. 3. Tiny hiatal hernia. Electronically Signed   By: Iven Finn M.D.   On: 05/27/2021 19:42   VAS [place] LOWER EXTREMITY VENOUS (DVT) (7a-7p)  Result Date: 05/27/2021  Lower Venous DVT Study Patient Name:  Latoya Guzman  Date of Exam:   05/27/2021 Medical Rec #: [id number]         Accession #:    [id number] Date of Birth: 20-May-1975         Patient Gender: F Patient Age:   69 years Exam Location:  [facility] Procedure:      VAS [place] LOWER EXTREMITY VENOUS (DVT) Referring Phys: Karimah Winquist  --------------------------------------------------------------------------------  Indications: Chest pain, and SOB.  Limitations: Body habitus and poor ultrasound/tissue interface. Comparison Study: Previous LLEV 02/24/2015 (unable to see results)                   Previous RLEV 12/22/2012 was negative for DVT. Performing Technologist: Rogelia Rohrer RVT, RDMS  Examination Guidelines: A complete evaluation includes B-mode imaging, spectral Doppler, color Doppler, and power Doppler as needed of all accessible portions of each vessel. Bilateral testing is considered an integral part of a complete examination. Limited examinations for reoccurring indications may be performed as noted. The reflux portion of the exam is performed with the patient in reverse Trendelenburg.  +---------+---------------+---------+-----------+----------+--------------+  RIGHT     Compressibility Phasicity Spontaneity Properties Thrombus Aging  +---------+---------------+---------+-----------+----------+--------------+  CFV       Full            Yes       Yes                                    +---------+---------------+---------+-----------+----------+--------------+  SFJ       Full                                                             +---------+---------------+---------+-----------+----------+--------------+  FV Prox   Full            Yes       Yes                                    +---------+---------------+---------+-----------+----------+--------------+  FV Mid    Full            Yes       Yes                                    +---------+---------------+---------+-----------+----------+--------------+  FV Distal Full            Yes       Yes                                    +---------+---------------+---------+-----------+----------+--------------+  PFV       Full                                                             +---------+---------------+---------+-----------+----------+--------------+  POP       Full            Yes        Yes                                    +---------+---------------+---------+-----------+----------+--------------+  PTV       Full                                                             +---------+---------------+---------+-----------+----------+--------------+  PERO      Full                                                             +---------+---------------+---------+-----------+----------+--------------+   +---------+---------------+---------+-----------+----------+--------------+  LEFT      Compressibility Phasicity Spontaneity Properties Thrombus Aging  +---------+---------------+---------+-----------+----------+--------------+  CFV       Full            Yes       Yes                                    +---------+---------------+---------+-----------+----------+--------------+  SFJ       Full                                                             +---------+---------------+---------+-----------+----------+--------------+  FV Prox   Full            Yes       Yes                                    +---------+---------------+---------+-----------+----------+--------------+  FV Mid    Full            Yes       Yes                                    +---------+---------------+---------+-----------+----------+--------------+  FV Distal Full            Yes       Yes                                    +---------+---------------+---------+-----------+----------+--------------+  PFV       Full                                                             +---------+---------------+---------+-----------+----------+--------------+  POP       Full            Yes       Yes                                    +---------+---------------+---------+-----------+----------+--------------+  PTV       Full                                                             +---------+---------------+---------+-----------+----------+--------------+  PERO      Full                                                              +---------+---------------+---------+-----------+----------+--------------+    Summary: BILATERAL: - No evidence of deep vein thrombosis seen in the lower extremities, bilaterally. - No evidence of superficial venous thrombosis in the lower extremities, bilaterally. -No evidence of popliteal cyst, bilaterally.   *See table(s) above for measurements and observations.    Preliminary     Procedures Procedures    Medications Ordered in ED Medications  iohexol (OMNIPAQUE) 350 MG/ML injection 65 mL (65 mLs Intravenous Contrast Given 05/27/21 1934)    ED Course/ Medical Decision Making/ A&P                           Medical Decision Making 46 year old female who presents to the ED today with complaint of sudden onset shortness of breath, heart palpitations, chest tightness.  On arrival to the ED today vitals are stable.  Patient nontachycardic, nontachypneic, satting 100% on room air.  She was provided with 324 mg aspirin and 4 mg Zofran in route with EMS.  EKG on arrival without acute ischemic changes.  Patient was medically screened and work-up started including BC, CMP, lipase, troponins.  Dimer was added however not collected.  CBC without leukocytosis.  Hemoglobin stable at 9.1.  History of anemia, unchanged from baseline.  CMP with a creatinine of 1.22 which does appear slightly elevated compared to previous of 0.80-1.0.  BUN within normal limits at 13.  Bicarb 21.  No other electrolyte abnormalities. Lipase within normal limits at 35.  Patient denies any abdominal pain.  Per triage report they noted right upper quadrant  pain however patient denies this during exam.  She has no abdominal tenderness palpation on exam.  Troponin of 4 Beta hcg negative CXR clear  On exam she is resting comfortably however states she feels mildly short of breath.  She does mention that there is concern for ammonia leak at work today.  Question of this could be causing patient's symptoms.  She does also report  history of antiphospholipid syndrome however no diagnosis of DVT or PE in the past.  On exam she is noted to have nonpitting edema to the left lower extremity which she states is chronic and unchanged from previous however she is noted to have Tenderness to the right calf.  Again dimer was ordered however never collected, will follow test results.  We will also plan for repeat troponin and bilateral DVT study.    Problems Addressed: Nonspecific chest pain:    Details: Workup overall reassuring. Troponin negative x 2. D dimer elevated at 0.77 however DVT study negative bilaterally and CTA without signs of PE. Will discharge pt home at this time with PCP follow up. Palpitations: acute illness or injury Shortness of breath: acute illness or injury  Amount and/or Complexity of Data Reviewed Labs: ordered.    Details: D dimer elevated at 0.77. Will proceed with CTA. Repeat troponin of 3 Radiology: ordered.    Details: DVT study negative CTA negative for PE. Does not very small hiatal hernia ECG/medicine tests: ordered.  Risk Prescription drug management.          Final Clinical Impression(s) / ED Diagnoses Final diagnoses:  Nonspecific chest pain  Shortness of breath  Palpitations    Rx / DC Orders ED Discharge Orders     None        Discharge Instructions      Your workup was overall reassuring in the ED today without any acute findings.   Please follow up with your PCP for further evaluation. If you do not have a PCP you can follow up with [facility] Wellness for primary care needs.   Return to the ED for any new/worsening symptoms        Eustaquio Maize, Hershal Coria 05/27/21 Nancee Liter, MD 05/28/21 1425

## 2021-05-27 NOTE — Discharge Instructions (Signed)
Your workup was overall reassuring in the ED today without any acute findings.   Please follow up with your PCP for further evaluation. If you do not have a PCP you can follow up with [facility] for primary care needs.   Return to the ED for any new/worsening symptoms

## 2021-05-27 NOTE — Progress Notes (Signed)
BLE venous duplex has been completed.  Preliminary results given to [facility].   Results can be found under chart review under CV PROC. 05/27/2021 5:42 PM Kamori Kitchens RVT, RDMS

## 2021-05-27 NOTE — ED Triage Notes (Signed)
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 20g saline lock in left hand. Patient alert, oriented, and in no apparent distress at this time.  BP 132/90 HR 73 CBG 110

## 2021-05-27 NOTE — ED Notes (Signed)
Patient transported to CT 

## 2021-05-27 NOTE — ED Provider Triage Note (Signed)
Emergency Medicine Provider Triage Evaluation Note  Latoya Guzman , a 46 y.o. female  was evaluated in triage. Pt complains of chest pain and shortness of breath.  She was at work at a warehouse in a freezer.  She states that she bent over had severe shortness of breath, like she was running.  She had difficulty catching her breath.  She has chronic chest tightness.  Cardiac CT last year with calcium score of 0.  History of antiphospholipid antibody positive per chart.  States she has never had a blood clot.  Review of Systems  Positive: Chest pain, shortness of breath Negative: Syncope  Physical Exam  BP (!) 149/89 (BP Location: Right Arm)    Pulse 76    Temp 98.8 F (37.1 C) (Oral)    Resp 16    SpO2 100%  Gen:   Awake, no distress   Resp:  Normal effort  MSK:   Moves extremities without difficulty  Other:  Lungs clear to auscultation bilaterally  Medical Decision Making  Medically screening exam initiated at 1:18 PM.  Appropriate orders placed.  [name] was informed that the remainder of the evaluation will be completed by another provider, this initial triage assessment does not replace that evaluation, and the importance of remaining in the ED until their evaluation is complete.     Carlisle Cater, PA-C 05/27/21 1323

## 2021-05-27 NOTE — ED Notes (Signed)
Pt to Vascular  

## 2021-05-28 ENCOUNTER — Telehealth: Payer: Self-pay | Admitting: Allergy & Immunology

## 2021-05-28 NOTE — Telephone Encounter (Addendum)
-----   Message from Valentina Shaggy, MD sent at 05/24/2021  9:26 AM EST ----- GI referral placed for GERD eval.  Called patient and let her know of referral being placed to [facility].   [place], [place] [phone]  Advised patient to reach out to their office to schedule if she has not heard back from them in 3-5 Business days. Patient verbalized understanding.   While on the phone with me patient asked to pass along a message to Dr. Ernst Bowler, patient when to the hospital last night. Patient was told she has a hernia in her chest and that could be the cause of the reflux and heartburn she has been experiencing. Patient was advised by hospital staff to let Dr. Ernst Bowler know.

## 2021-06-01 NOTE — Telephone Encounter (Signed)
Thank you, Dee!   Riggin Cuttino, MD [facility]  

## 2021-06-05 NOTE — Telephone Encounter (Signed)
Reviewed the ED note.  She does have a tiny hiatal hernia.  We have already sent her to GI, who should be able to address this more fully.  Usually there is nothing to do aside from aggressive treatment with reflux medications.  Rarely, surgery is indicated.  Salvatore Marvel, MD [facility]

## 2021-06-05 NOTE — Telephone Encounter (Signed)
Please see patient's message

## 2021-06-09 ENCOUNTER — Encounter: Payer: Self-pay | Admitting: Internal Medicine

## 2021-06-09 ENCOUNTER — Inpatient Hospital Stay: Payer: Medicaid Other | Attending: Oncology | Admitting: Internal Medicine

## 2021-06-09 ENCOUNTER — Other Ambulatory Visit: Payer: Self-pay | Admitting: Internal Medicine

## 2021-06-09 ENCOUNTER — Telehealth: Payer: Self-pay | Admitting: Pharmacy Technician

## 2021-06-09 ENCOUNTER — Inpatient Hospital Stay: Payer: Medicaid Other

## 2021-06-09 ENCOUNTER — Other Ambulatory Visit: Payer: Self-pay

## 2021-06-09 VITALS — BP 121/67 | HR 87 | Temp 97.3°F | Resp 19 | Ht 71.0 in | Wt 242.0 lb

## 2021-06-09 DIAGNOSIS — D6861 Antiphospholipid syndrome: Secondary | ICD-10-CM

## 2021-06-09 DIAGNOSIS — Z79899 Other long term (current) drug therapy: Secondary | ICD-10-CM | POA: Diagnosis not present

## 2021-06-09 DIAGNOSIS — N920 Excessive and frequent menstruation with regular cycle: Secondary | ICD-10-CM | POA: Diagnosis not present

## 2021-06-09 DIAGNOSIS — F329 Major depressive disorder, single episode, unspecified: Secondary | ICD-10-CM

## 2021-06-09 DIAGNOSIS — D573 Sickle-cell trait: Secondary | ICD-10-CM | POA: Diagnosis not present

## 2021-06-09 DIAGNOSIS — D259 Leiomyoma of uterus, unspecified: Secondary | ICD-10-CM

## 2021-06-09 DIAGNOSIS — Z809 Family history of malignant neoplasm, unspecified: Secondary | ICD-10-CM | POA: Diagnosis not present

## 2021-06-09 DIAGNOSIS — D539 Nutritional anemia, unspecified: Secondary | ICD-10-CM

## 2021-06-09 DIAGNOSIS — D500 Iron deficiency anemia secondary to blood loss (chronic): Secondary | ICD-10-CM | POA: Diagnosis not present

## 2021-06-09 LAB — CBC WITH DIFFERENTIAL (CANCER CENTER ONLY)
Abs Immature Granulocytes: 0.01 10*3/uL (ref 0.00–0.07)
Basophils Absolute: 0 10*3/uL (ref 0.0–0.1)
Basophils Relative: 0 %
Eosinophils Absolute: 0.1 10*3/uL (ref 0.0–0.5)
Eosinophils Relative: 1 %
HCT: 26.8 % — ABNORMAL LOW (ref 36.0–46.0)
Hemoglobin: 8.3 g/dL — ABNORMAL LOW (ref 12.0–15.0)
Immature Granulocytes: 0 %
Lymphocytes Relative: 34 %
Lymphs Abs: 1.6 10*3/uL (ref 0.7–4.0)
MCH: 18.3 pg — ABNORMAL LOW (ref 26.0–34.0)
MCHC: 31 g/dL (ref 30.0–36.0)
MCV: 59.2 fL — ABNORMAL LOW (ref 80.0–100.0)
Monocytes Absolute: 0.4 10*3/uL (ref 0.1–1.0)
Monocytes Relative: 9 %
Neutro Abs: 2.6 10*3/uL (ref 1.7–7.7)
Neutrophils Relative %: 56 %
Platelet Count: 333 10*3/uL (ref 150–400)
RBC: 4.53 MIL/uL (ref 3.87–5.11)
RDW: 19 % — ABNORMAL HIGH (ref 11.5–15.5)
WBC Count: 4.6 10*3/uL (ref 4.0–10.5)
nRBC: 0 % (ref 0.0–0.2)

## 2021-06-09 LAB — CMP (CANCER CENTER ONLY)
ALT: 12 U/L (ref 0–44)
AST: 21 U/L (ref 15–41)
Albumin: 4.3 g/dL (ref 3.5–5.0)
Alkaline Phosphatase: 61 U/L (ref 38–126)
Anion gap: 5 (ref 5–15)
BUN: 16 mg/dL (ref 6–20)
CO2: 23 mmol/L (ref 22–32)
Calcium: 9.2 mg/dL (ref 8.9–10.3)
Chloride: 106 mmol/L (ref 98–111)
Creatinine: 0.8 mg/dL (ref 0.44–1.00)
GFR, Estimated: >60 mL/min (ref >60)
Glucose, Bld: 120 mg/dL — ABNORMAL HIGH (ref 70–99)
Potassium: 3.5 mmol/L (ref 3.5–5.1)
Sodium: 134 mmol/L — ABNORMAL LOW (ref 135–145)
Total Bilirubin: 0.3 mg/dL (ref 0.3–1.2)
Total Protein: 7.7 g/dL (ref 6.5–8.1)

## 2021-06-09 LAB — TSH: TSH: 1.26 u[IU]/mL (ref 0.308–3.960)

## 2021-06-09 LAB — RETICULOCYTES
Immature Retic Fract: 6.9 % (ref 2.3–15.9)
RBC.: 4.5 MIL/uL (ref 3.87–5.11)
Retic Count, Absolute: 18.5 10*3/uL — ABNORMAL LOW (ref 19.0–186.0)
Retic Ct Pct: 0.5 % (ref 0.4–3.1)

## 2021-06-09 LAB — FOLATE: Folate: 14.5 ng/mL (ref >5.9)

## 2021-06-09 LAB — FERRITIN: Ferritin: 4 ng/mL — ABNORMAL LOW (ref 11–307)

## 2021-06-09 LAB — VITAMIN B12: Vitamin B-12: 423 pg/mL (ref 180–914)
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
## 2021-06-09 NOTE — Telephone Encounter (Signed)
Auth Submission: no auth needed [place] Medication & CPT/J Code(s) submitted: Venofer (Iron Sucrose) J1756 Route of submission (phone, fax, portal): portal Auth type: Buy/Bill Units/visits requested: 3 visits Reference number: [id number] Approval from: 06/09/21 to 06/23/21   Patient insurance will term 06/23/21. Patient will need to be scheduled asap.

## 2021-06-10 LAB — IRON AND IRON BINDING CAPACITY (CC-WL,HP ONLY)
Iron: 25 ug/dL — ABNORMAL LOW (ref 28–170)
Saturation Ratios: 5 % — ABNORMAL LOW (ref 10.4–31.8)
TIBC: 493 ug/dL — ABNORMAL HIGH (ref 250–450)
UIBC: 468 ug/dL — ABNORMAL HIGH (ref 148–442)

## 2021-06-10 LAB — PROTEIN ELECTROPHORESIS, SERUM, WITH REFLEX
A/G Ratio: 1.1 (ref 0.7–1.7)
Albumin ELP: 3.9 g/dL (ref 2.9–4.4)
Alpha-1-Globulin: 0.2 g/dL (ref 0.0–0.4)
Alpha-2-Globulin: 0.6 g/dL (ref 0.4–1.0)
Beta Globulin: 1.2 g/dL (ref 0.7–1.3)
Gamma Globulin: 1.5 g/dL (ref 0.4–1.8)
Globulin, Total: 3.4 g/dL (ref 2.2–3.9)
Total Protein ELP: 7.3 g/dL (ref 6.0–8.5)

## 2021-06-15 ENCOUNTER — Ambulatory Visit (INDEPENDENT_AMBULATORY_CARE_PROVIDER_SITE_OTHER): Payer: Medicaid Other | Admitting: *Deleted

## 2021-06-15 ENCOUNTER — Other Ambulatory Visit: Payer: Self-pay

## 2021-06-15 VITALS — BP 106/69 | HR 72 | Temp 98.1°F | Resp 20 | Ht 71.0 in | Wt 239.2 lb

## 2021-06-15 DIAGNOSIS — D500 Iron deficiency anemia secondary to blood loss (chronic): Secondary | ICD-10-CM | POA: Diagnosis not present

## 2021-06-15 MED ORDER — SODIUM CHLORIDE 0.9 % IV SOLN: Freq: Once | INTRAVENOUS | Status: DC | PRN

## 2021-06-15 MED ORDER — METHYLPREDNISOLONE SODIUM SUCC 125 MG IJ SOLR: 125.0000 mg | Freq: Once | INTRAMUSCULAR | Status: DC | PRN

## 2021-06-15 MED ORDER — EPINEPHRINE 0.3 MG/0.3ML IJ SOAJ: 0.3000 mg | Freq: Once | INTRAMUSCULAR | Status: DC | PRN

## 2021-06-15 MED ORDER — SODIUM CHLORIDE 0.9 % IV SOLN
300.0000 mg | Freq: Once | INTRAVENOUS | Status: AC
  Administered 2021-06-15: 300 mg via INTRAVENOUS
  Filled 2021-06-15: qty 15

## 2021-06-15 MED ORDER — ALBUTEROL SULFATE HFA 108 (90 BASE) MCG/ACT IN AERS: 2.0000 | INHALATION_SPRAY | Freq: Once | RESPIRATORY_TRACT | Status: DC | PRN

## 2021-06-15 MED ORDER — DIPHENHYDRAMINE HCL 50 MG/ML IJ SOLN: 50.0000 mg | Freq: Once | INTRAMUSCULAR | Status: DC | PRN

## 2021-06-15 MED ORDER — FAMOTIDINE IN NACL 20-0.9 MG/50ML-% IV SOLN: 20.0000 mg | Freq: Once | INTRAVENOUS | Status: DC | PRN

## 2021-06-15 NOTE — Progress Notes (Signed)
Diagnosis: Iron Deficiency Anemia  Provider:  Marshell Garfinkel, MD  Procedure: Infusion  IV Type: Peripheral, IV Location: L Hand  Venofer (Iron Sucrose), Dose: 300 mg  Infusion Start Time: 6016 am  Infusion Stop Time: 1215 pm  Post Infusion IV Care: Observation period completed and Peripheral IV Discontinued  Discharge: Condition: Good, Destination: Home . AVS provided to patient.   Performed by:  Oren Beckmann, RN

## 2021-06-18 ENCOUNTER — Ambulatory Visit (INDEPENDENT_AMBULATORY_CARE_PROVIDER_SITE_OTHER): Payer: Medicaid Other

## 2021-06-18 ENCOUNTER — Other Ambulatory Visit: Payer: Self-pay

## 2021-06-18 VITALS — BP 110/72 | HR 81 | Temp 97.7°F | Resp 18 | Ht 71.0 in | Wt 240.0 lb

## 2021-06-18 DIAGNOSIS — D500 Iron deficiency anemia secondary to blood loss (chronic): Secondary | ICD-10-CM | POA: Diagnosis not present

## 2021-06-18 MED ORDER — EPINEPHRINE 0.3 MG/0.3ML IJ SOAJ: 0.3000 mg | Freq: Once | INTRAMUSCULAR | Status: DC | PRN

## 2021-06-18 MED ORDER — HEPARIN SOD (PORK) LOCK FLUSH 100 UNIT/ML IV SOLN: 500.0000 [IU] | Freq: Once | INTRAVENOUS | Status: DC | PRN

## 2021-06-18 MED ORDER — SODIUM CHLORIDE 0.9% FLUSH: 10.0000 mL | Freq: Once | INTRAVENOUS | Status: DC | PRN

## 2021-06-18 MED ORDER — ALBUTEROL SULFATE HFA 108 (90 BASE) MCG/ACT IN AERS: 2.0000 | INHALATION_SPRAY | Freq: Once | RESPIRATORY_TRACT | Status: DC | PRN

## 2021-06-18 MED ORDER — DIPHENHYDRAMINE HCL 50 MG/ML IJ SOLN: 50.0000 mg | Freq: Once | INTRAMUSCULAR | Status: DC | PRN

## 2021-06-18 MED ORDER — SODIUM CHLORIDE 0.9 % IV SOLN
300.0000 mg | Freq: Once | INTRAVENOUS | Status: AC
  Administered 2021-06-18: 300 mg via INTRAVENOUS
  Filled 2021-06-18: qty 15

## 2021-06-18 MED ORDER — METHYLPREDNISOLONE SODIUM SUCC 125 MG IJ SOLR: 125.0000 mg | Freq: Once | INTRAMUSCULAR | Status: DC | PRN

## 2021-06-18 MED ORDER — ALTEPLASE 2 MG IJ SOLR: 2.0000 mg | Freq: Once | INTRAMUSCULAR | Status: DC | PRN

## 2021-06-18 MED ORDER — HEPARIN SOD (PORK) LOCK FLUSH 100 UNIT/ML IV SOLN: 250.0000 [IU] | Freq: Once | INTRAVENOUS | Status: DC | PRN

## 2021-06-18 MED ORDER — ANTICOAGULANT SODIUM CITRATE 4% (200MG/5ML) IV SOLN
5.0000 mL | Freq: Once | Status: DC | PRN
  Filled 2021-06-18 (×2): qty 5

## 2021-06-18 MED ORDER — FAMOTIDINE IN NACL 20-0.9 MG/50ML-% IV SOLN: 20.0000 mg | Freq: Once | INTRAVENOUS | Status: DC | PRN

## 2021-06-18 MED ORDER — SODIUM CHLORIDE 0.9 % IV SOLN: Freq: Once | INTRAVENOUS | Status: DC | PRN

## 2021-06-18 MED ORDER — SODIUM CHLORIDE 0.9% FLUSH: 3.0000 mL | Freq: Once | INTRAVENOUS | Status: DC | PRN

## 2021-06-18 NOTE — Progress Notes (Signed)
Diagnosis: Iron Deficiency Anemia  Provider:  Marshell Garfinkel, MD  Procedure: Infusion  IV Type: Peripheral, IV Location: L Antecubital  Venofer (Iron Sucrose), Dose: 300 mg  Infusion Start Time: 10.21 06/18/2021  Infusion Stop Time: 12.05 2/232023  Post Infusion IV Care: Peripheral IV Discontinued  Discharge: Condition: Good, Destination: Home . AVS provided to patient.   Performed by:  Arnoldo Morale, RN

## 2021-06-23 ENCOUNTER — Ambulatory Visit (INDEPENDENT_AMBULATORY_CARE_PROVIDER_SITE_OTHER): Payer: Medicaid Other

## 2021-06-23 ENCOUNTER — Other Ambulatory Visit: Payer: Self-pay

## 2021-06-23 VITALS — BP 110/71 | HR 92 | Temp 98.1°F | Resp 20 | Ht 71.0 in | Wt 242.2 lb

## 2021-06-23 DIAGNOSIS — D500 Iron deficiency anemia secondary to blood loss (chronic): Secondary | ICD-10-CM

## 2021-06-23 MED ORDER — SODIUM CHLORIDE 0.9 % IV SOLN: Freq: Once | INTRAVENOUS | Status: DC | PRN

## 2021-06-23 MED ORDER — SODIUM CHLORIDE 0.9 % IV SOLN
300.0000 mg | Freq: Once | INTRAVENOUS | Status: AC
  Administered 2021-06-23: 300 mg via INTRAVENOUS
  Filled 2021-06-23: qty 15

## 2021-06-23 MED ORDER — DIPHENHYDRAMINE HCL 50 MG/ML IJ SOLN: 50.0000 mg | Freq: Once | INTRAMUSCULAR | Status: DC | PRN

## 2021-06-23 MED ORDER — ALBUTEROL SULFATE HFA 108 (90 BASE) MCG/ACT IN AERS: 2.0000 | INHALATION_SPRAY | Freq: Once | RESPIRATORY_TRACT | Status: DC | PRN

## 2021-06-23 MED ORDER — METHYLPREDNISOLONE SODIUM SUCC 125 MG IJ SOLR: 125.0000 mg | Freq: Once | INTRAMUSCULAR | Status: DC | PRN

## 2021-06-23 MED ORDER — EPINEPHRINE 0.3 MG/0.3ML IJ SOAJ: 0.3000 mg | Freq: Once | INTRAMUSCULAR | Status: DC | PRN

## 2021-06-23 MED ORDER — FAMOTIDINE IN NACL 20-0.9 MG/50ML-% IV SOLN: 20.0000 mg | Freq: Once | INTRAVENOUS | Status: DC | PRN

## 2021-06-23 NOTE — Progress Notes (Signed)
Diagnosis: Iron Deficiency Anemia  Provider:  Marshell Garfinkel, MD  Procedure: Infusion  IV Type: Peripheral, IV Location: R Antecubital  Venofer (Iron Sucrose), Dose: 300 mg  Infusion Start Time: 10.16 06/23/2021  Infusion Stop Time: 12.05 06/23/2021  Post Infusion IV Care: Peripheral IV Discontinued  Discharge: Condition: Good, Destination: Home . AVS provided to patient.   Performed by:  Arnoldo Morale, RN

## 2021-07-11 ENCOUNTER — Emergency Department (HOSPITAL_COMMUNITY): Payer: Medicaid Other

## 2021-07-11 ENCOUNTER — Emergency Department (HOSPITAL_COMMUNITY)
Admission: EM | Admit: 2021-07-11 | Discharge: 2021-07-11 | Disposition: A | Discharge status: Home / Self Care | Payer: Medicaid Other | Attending: Emergency Medicine | Admitting: Emergency Medicine

## 2021-07-11 ENCOUNTER — Encounter (HOSPITAL_COMMUNITY): Payer: Self-pay

## 2021-07-11 DIAGNOSIS — R55 Syncope and collapse: Secondary | ICD-10-CM | POA: Insufficient documentation

## 2021-07-11 DIAGNOSIS — Y92410 Unspecified street and highway as the place of occurrence of the external cause: Secondary | ICD-10-CM | POA: Insufficient documentation

## 2021-07-11 DIAGNOSIS — R110 Nausea: Secondary | ICD-10-CM | POA: Insufficient documentation

## 2021-07-11 DIAGNOSIS — S29001A Unspecified injury of muscle and tendon of front wall of thorax, initial encounter: Secondary | ICD-10-CM | POA: Diagnosis present

## 2021-07-11 DIAGNOSIS — R42 Dizziness and giddiness: Secondary | ICD-10-CM | POA: Diagnosis not present

## 2021-07-11 DIAGNOSIS — R4020 Unspecified coma: Secondary | ICD-10-CM | POA: Diagnosis not present

## 2021-07-11 DIAGNOSIS — D649 Anemia, unspecified: Secondary | ICD-10-CM | POA: Insufficient documentation

## 2021-07-11 DIAGNOSIS — R0602 Shortness of breath: Secondary | ICD-10-CM | POA: Insufficient documentation

## 2021-07-11 DIAGNOSIS — R079 Chest pain, unspecified: Secondary | ICD-10-CM | POA: Diagnosis not present

## 2021-07-11 DIAGNOSIS — R0789 Other chest pain: Secondary | ICD-10-CM

## 2021-07-11 DIAGNOSIS — R200 Anesthesia of skin: Secondary | ICD-10-CM | POA: Insufficient documentation

## 2021-07-11 DIAGNOSIS — R531 Weakness: Secondary | ICD-10-CM | POA: Diagnosis not present

## 2021-07-11 DIAGNOSIS — R0902 Hypoxemia: Secondary | ICD-10-CM | POA: Diagnosis not present

## 2021-07-11 LAB — CBC
HCT: 33.6 % — ABNORMAL LOW (ref 36.0–46.0)
Hemoglobin: 11 g/dL — ABNORMAL LOW (ref 12.0–15.0)
MCH: 22 pg — ABNORMAL LOW (ref 26.0–34.0)
MCHC: 32.7 g/dL (ref 30.0–36.0)
MCV: 67.2 fL — ABNORMAL LOW (ref 80.0–100.0)
Platelets: 267 10*3/uL (ref 150–400)
RBC: 5 MIL/uL (ref 3.87–5.11)
RDW: 28.8 % — ABNORMAL HIGH (ref 11.5–15.5)
WBC: 4 10*3/uL (ref 4.0–10.5)
nRBC: 0 % (ref 0.0–0.2)

## 2021-07-11 LAB — BASIC METABOLIC PANEL
Anion gap: 8 (ref 5–15)
BUN: 10 mg/dL (ref 6–20)
CO2: 23 mmol/L (ref 22–32)
Calcium: 9.6 mg/dL (ref 8.9–10.3)
Chloride: 105 mmol/L (ref 98–111)
Creatinine, Ser: 0.99 mg/dL (ref 0.44–1.00)
GFR, Estimated: >60 mL/min (ref >60)
Glucose, Bld: 100 mg/dL — ABNORMAL HIGH (ref 70–99)
Potassium: 3.8 mmol/L (ref 3.5–5.1)
Sodium: 136 mmol/L (ref 135–145)

## 2021-07-11 LAB — TROPONIN I (HIGH SENSITIVITY)
Troponin I (High Sensitivity): <2 ng/L (ref <18)
Troponin I (High Sensitivity): 3 ng/L (ref <18)

## 2021-07-11 LAB — I-STAT BETA HCG BLOOD, ED (MC, WL, AP ONLY): I-stat hCG, quantitative: <5 m[IU]/mL (ref <5)

## 2021-07-11 MED ORDER — SODIUM CHLORIDE 0.9 % IV BOLUS
1000.0000 mL | Freq: Once | INTRAVENOUS | Status: AC
  Administered 2021-07-11: 1000 mL via INTRAVENOUS

## 2021-07-11 MED ORDER — ACETAMINOPHEN 500 MG PO TABS
1000.0000 mg | ORAL_TABLET | Freq: Once | ORAL | Status: DC
Start: 2021-07-11 — End: 2021-07-11
  Filled 2021-07-11: qty 2

## 2021-07-11 MED ORDER — SODIUM CHLORIDE 0.9 % IV SOLN: INTRAVENOUS | Status: DC

## 2021-07-11 MED ORDER — METOCLOPRAMIDE HCL 5 MG/ML IJ SOLN
10.0000 mg | Freq: Once | INTRAMUSCULAR | Status: AC
  Administered 2021-07-11: 10 mg via INTRAVENOUS
  Filled 2021-07-11: qty 2

## 2021-07-11 MED ORDER — GADOBUTROL 1 MMOL/ML IV SOLN
10.0000 mL | Freq: Once | INTRAVENOUS | Status: AC | PRN
  Administered 2021-07-11: 10 mL via INTRAVENOUS

## 2021-07-11 MED ORDER — DIPHENHYDRAMINE HCL 50 MG/ML IJ SOLN
12.5000 mg | Freq: Once | INTRAMUSCULAR | Status: AC
  Administered 2021-07-11: 12.5 mg via INTRAVENOUS
  Filled 2021-07-11: qty 1

## 2021-07-11 MED ORDER — IOHEXOL 350 MG/ML SOLN
100.0000 mL | Freq: Once | INTRAVENOUS | Status: AC | PRN
  Administered 2021-07-11: 100 mL via INTRAVENOUS
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
## 2021-07-11 NOTE — ED Notes (Signed)
Patient transported to CT 
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
## 2021-07-11 NOTE — Discharge Instructions (Addendum)
You were evaluated in the Emergency Department and after careful evaluation, we did not find any emergent condition requiring admission or further testing in the hospital. ? ?Your exam/testing today was overall reassuring. Your imaging work-up was reassuring.  No evidence of stroke.  No other traumatic injuries were identified.  Recommend routine PCP follow-up. ? ?Please return to the Emergency Department if you experience any worsening of your condition.  Thank you for allowing [place] to be a part of your care.  ?

## 2021-07-19 IMAGING — US US PELVIS COMPLETE TRANSABD/TRANSVAG W DUPLEX
1 series · 8 of 8 positions shown · non-contrast
Comparison: CT 06/09/2016.  Ultrasound 02/25/2015

CLINICAL DATA: Pelvic and left lower quadrant pain

EXAM:
TRANSABDOMINAL AND TRANSVAGINAL ULTRASOUND OF PELVIS
DOPPLER ULTRASOUND OF OVARIES
TECHNIQUE: Both transabdominal and transvaginal ultrasound examinations of the
pelvis were performed. Transabdominal technique was performed for
global imaging of the pelvis including uterus, ovaries, adnexal
regions, and pelvic cul-de-sac.
It was necessary to proceed with endovaginal exam following the
transabdominal exam to visualize the endometrium and adnexa. Color
and duplex Doppler ultrasound was utilized to evaluate blood flow to
the ovaries.

[Series 1: us pelvis complete transabd/transvag w duplex · 8 of 8 slices shown]
[im 1/8]
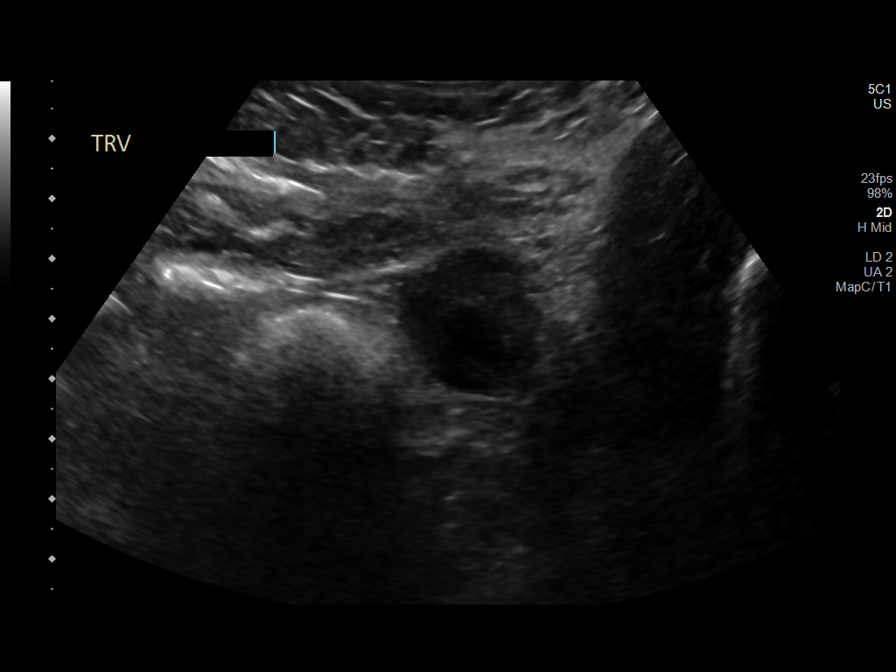
[im 2/8]
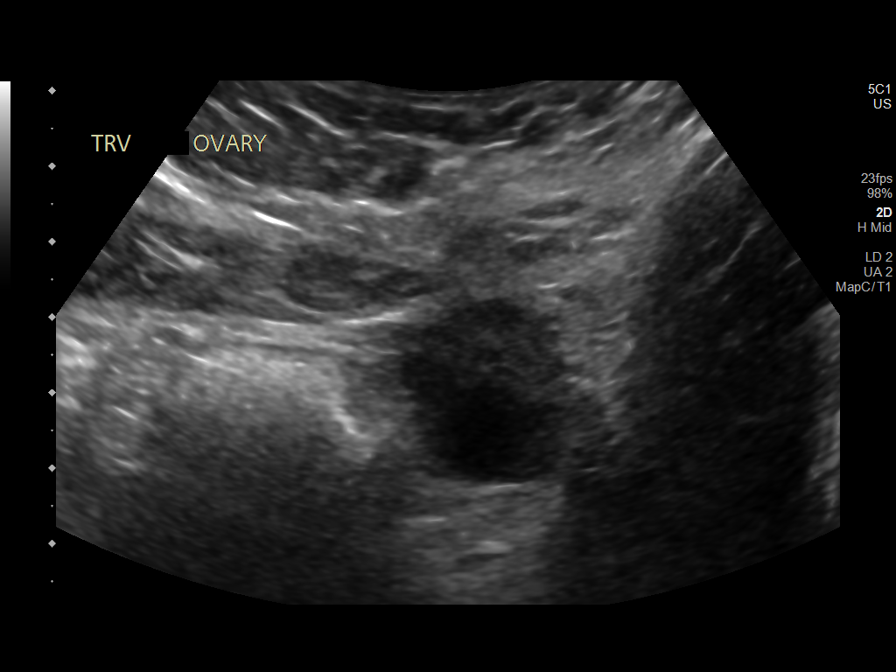
[im 3/8]
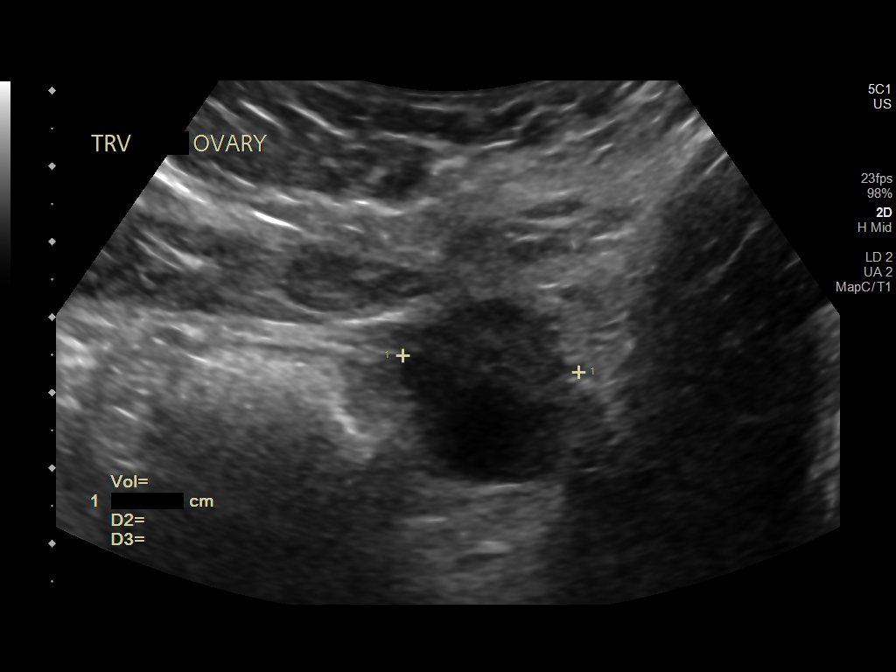
[im 4/8]
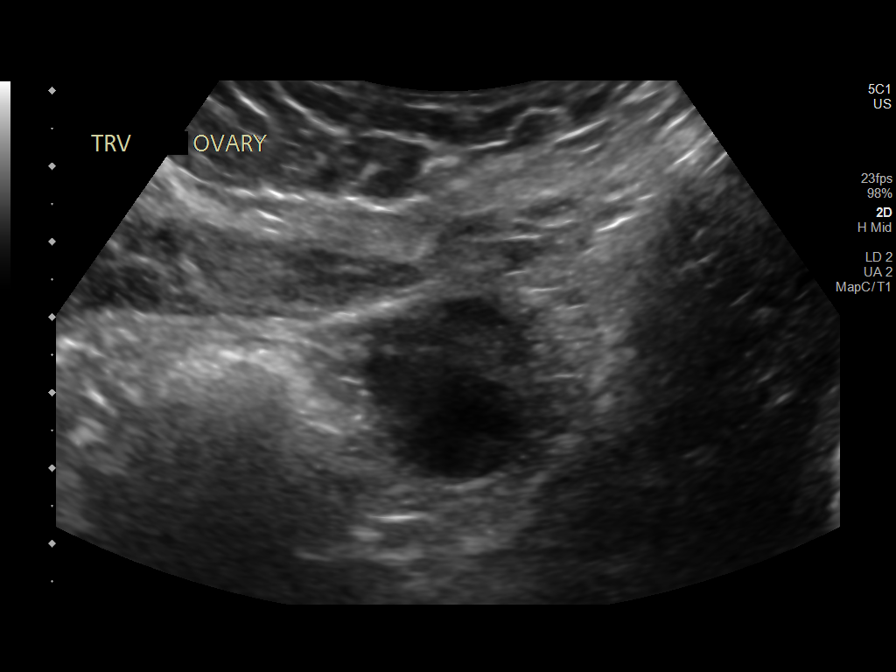
[im 5/8]
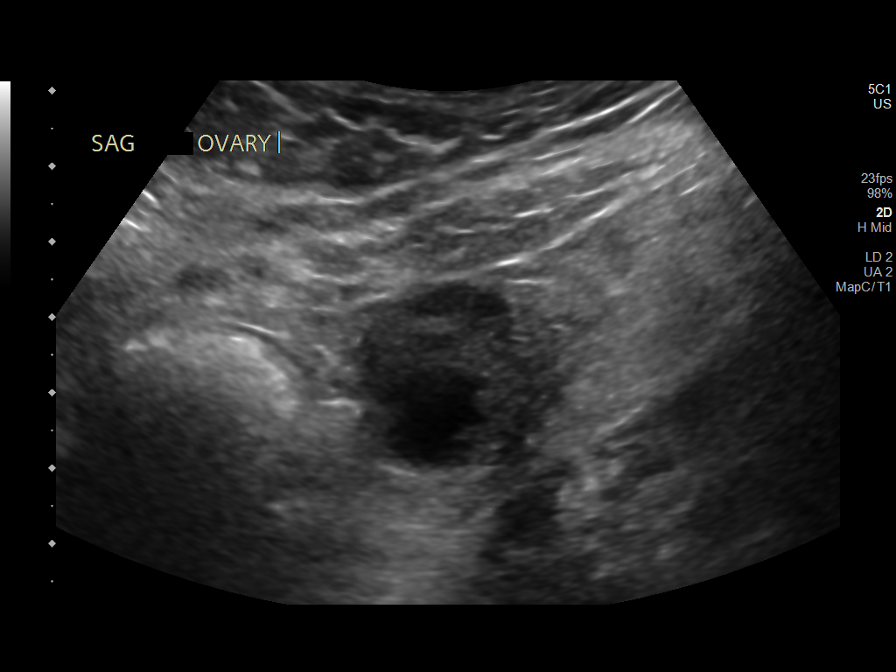
[im 6/8]
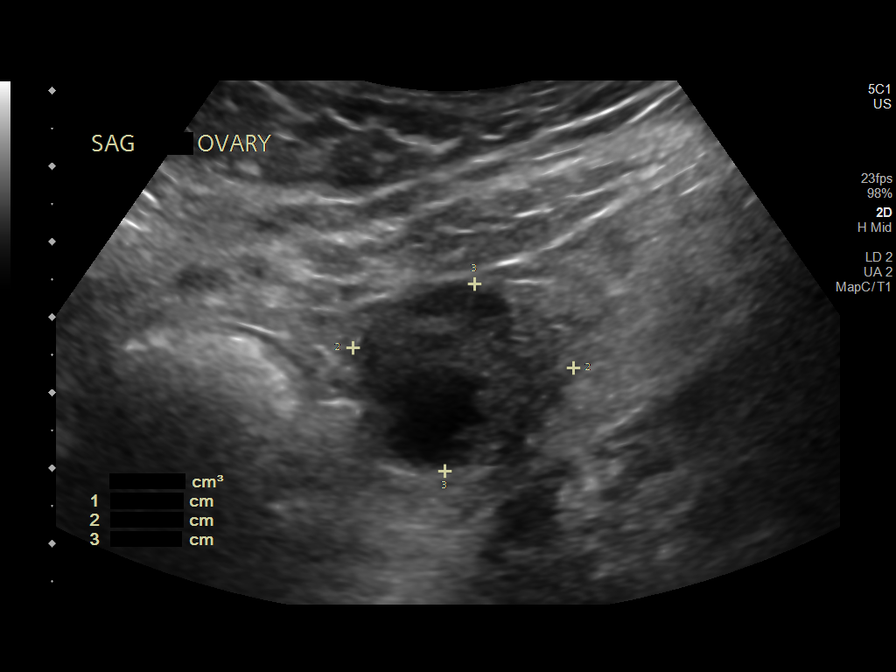
[im 7/8]
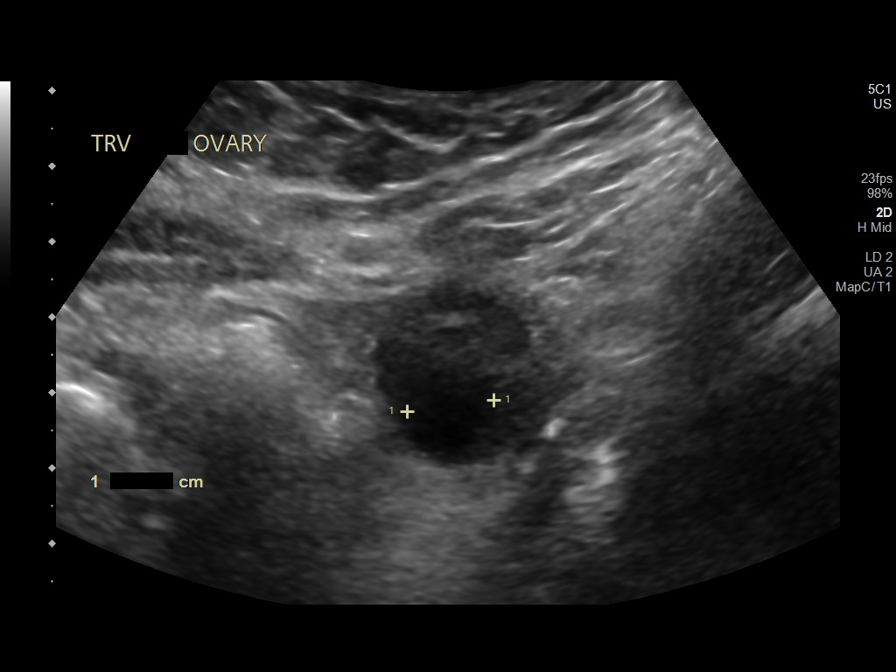
[im 8/8]
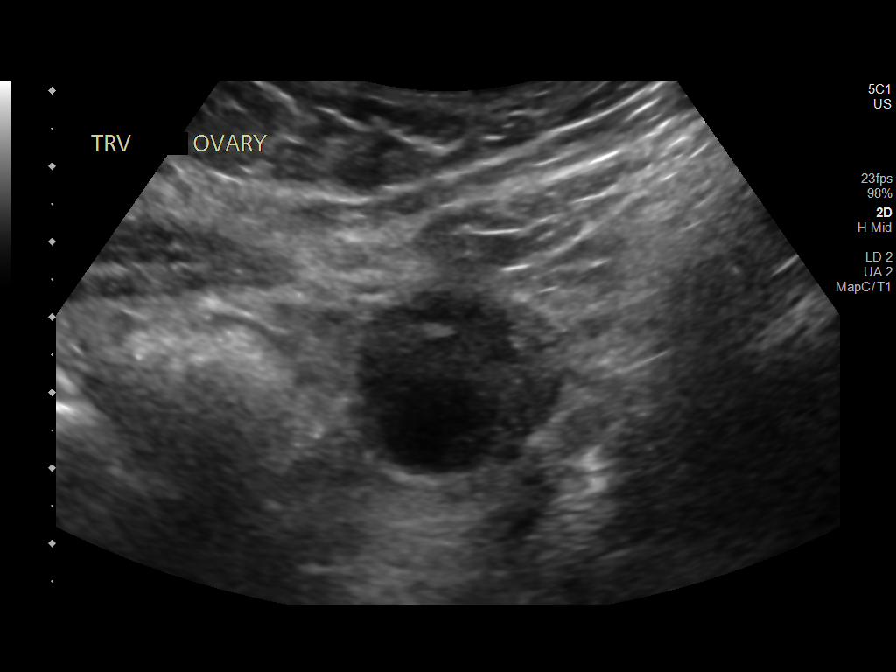

[8 of 8 positions shown; findings below may reference images not displayed]

FINDINGS: Uterus

Measurements: 11.5 x 6.1 x 7.6 cm = volume: 280 mL. Heterogeneous
intramural fibroid in the left uterine body/fundus measuring 2.4 x
2.1 x 2.3 cm. Nabothian cysts of the level of the cervix.

Endometrium

Thickness: 15 mm. There are 2 rounded echogenic foci within the
endometrial cavity measuring up to 1.0 cm and 0.9 cm without
internal vascularity by color Doppler.

Right ovary

Measurements: 2.7 x 2.0 x 2.1 cm = volume: 6 mL. Multiple small
follicles. Normal appearance/no adnexal mass.

Left ovary

Measurements: 2.9 x 2.5 x 2.3 cm = volume: 9 mL. Rounded 1.4 cm
hypoechoic area within the left ovary, possibly a hemorrhagic cyst.
Left ovary is otherwise unremarkable in appearance. Left ovary is
positioned high and laterally in the pelvis and was only able to be
visualized transabdominally.

Pulsed Doppler evaluation of both ovaries demonstrates normal
low-resistance arterial and venous waveforms.

Other findings

No abnormal free fluid.
IMPRESSION: 1. Negative for adnexal torsion.
2. Within the endometrium are 2 rounded echogenic foci measuring up
to 1 cm without internal vascularity. Findings may represent
internal blood products versus small submucosal fibroids or polyps.
A repeat examination in 6-12 weeks at a different phase of patient's
menstrual cycle is suggested to assess for resolution.
3. Intramural 2.4 cm left uterine body fibroid.
4. Probable 1.4 cm hemorrhagic cyst within the left ovary.

## 2021-08-11 ENCOUNTER — Other Ambulatory Visit: Payer: Self-pay

## 2021-08-11 ENCOUNTER — Inpatient Hospital Stay: Payer: Medicaid Other | Attending: Oncology

## 2021-08-11 ENCOUNTER — Inpatient Hospital Stay (HOSPITAL_BASED_OUTPATIENT_CLINIC_OR_DEPARTMENT_OTHER): Payer: Medicaid Other | Admitting: Internal Medicine

## 2021-08-11 VITALS — BP 126/84 | HR 68 | Temp 97.7°F | Resp 18 | Ht 71.0 in | Wt 247.6 lb

## 2021-08-11 DIAGNOSIS — D500 Iron deficiency anemia secondary to blood loss (chronic): Secondary | ICD-10-CM

## 2021-08-11 DIAGNOSIS — N920 Excessive and frequent menstruation with regular cycle: Secondary | ICD-10-CM | POA: Diagnosis not present

## 2021-08-11 DIAGNOSIS — R635 Abnormal weight gain: Secondary | ICD-10-CM | POA: Diagnosis not present

## 2021-08-11 DIAGNOSIS — Z6834 Body mass index (BMI) 34.0-34.9, adult: Secondary | ICD-10-CM | POA: Diagnosis not present

## 2021-08-11 DIAGNOSIS — Z79899 Other long term (current) drug therapy: Secondary | ICD-10-CM | POA: Diagnosis not present

## 2021-08-11 DIAGNOSIS — D573 Sickle-cell trait: Secondary | ICD-10-CM | POA: Diagnosis not present

## 2021-08-11 LAB — CBC WITH DIFFERENTIAL (CANCER CENTER ONLY)
Abs Immature Granulocytes: 0.01 10*3/uL (ref 0.00–0.07)
Basophils Absolute: 0 10*3/uL (ref 0.0–0.1)
Basophils Relative: 0 %
Eosinophils Absolute: 0.1 10*3/uL (ref 0.0–0.5)
Eosinophils Relative: 2 %
HCT: 29.3 % — ABNORMAL LOW (ref 36.0–46.0)
Hemoglobin: 9.7 g/dL — ABNORMAL LOW (ref 12.0–15.0)
Immature Granulocytes: 0 %
Lymphocytes Relative: 28 %
Lymphs Abs: 1.2 10*3/uL (ref 0.7–4.0)
MCH: 23.6 pg — ABNORMAL LOW (ref 26.0–34.0)
MCHC: 33.1 g/dL (ref 30.0–36.0)
MCV: 71.3 fL — ABNORMAL LOW (ref 80.0–100.0)
Monocytes Absolute: 0.4 10*3/uL (ref 0.1–1.0)
Monocytes Relative: 9 %
Neutro Abs: 2.6 10*3/uL (ref 1.7–7.7)
Neutrophils Relative %: 61 %
Platelet Count: 247 10*3/uL (ref 150–400)
RBC: 4.11 MIL/uL (ref 3.87–5.11)
RDW: 25.6 % — ABNORMAL HIGH (ref 11.5–15.5)
WBC Count: 4.4 10*3/uL (ref 4.0–10.5)
nRBC: 0 % (ref 0.0–0.2)

## 2021-08-11 LAB — IRON AND IRON BINDING CAPACITY (CC-WL,HP ONLY)
Iron: 37 ug/dL (ref 28–170)
Saturation Ratios: 9 % — ABNORMAL LOW (ref 10.4–31.8)
TIBC: 393 ug/dL (ref 250–450)
UIBC: 356 ug/dL (ref 148–442)

## 2021-08-11 LAB — FERRITIN: Ferritin: 7 ng/mL — ABNORMAL LOW (ref 11–307)
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
## 2021-08-13 ENCOUNTER — Telehealth: Payer: Self-pay | Admitting: Pharmacy Technician

## 2021-08-13 ENCOUNTER — Other Ambulatory Visit: Payer: Self-pay | Admitting: Pharmacy Technician

## 2021-08-13 NOTE — Telephone Encounter (Signed)
Dr. Julien Nordmann, ?FYI NOTE: ? ?Auth Submission: NO AUTH NEEDED ?Payer: [employer] MEDICAID ?Medication & CPT/J Code(s) submitted: Venofer (Iron Sucrose) J1756 ?Route of submission (phone, fax, portal): PHONE ?Auth type: Buy/Bill ?Units/visits requested: VENOFER ?Reference number:  ?Approval from: 08/13/21 to 04/14/22  ?Patient will be scheduled as soon as possible  ?

## 2021-08-27 ENCOUNTER — Ambulatory Visit: Payer: Medicaid Other

## 2021-09-01 ENCOUNTER — Ambulatory Visit (INDEPENDENT_AMBULATORY_CARE_PROVIDER_SITE_OTHER): Payer: Medicaid Other

## 2021-09-01 VITALS — BP 104/71 | HR 82 | Temp 98.2°F | Resp 18 | Ht 71.0 in | Wt 237.0 lb

## 2021-09-01 DIAGNOSIS — D500 Iron deficiency anemia secondary to blood loss (chronic): Secondary | ICD-10-CM | POA: Diagnosis not present

## 2021-09-01 MED ORDER — SODIUM CHLORIDE 0.9 % IV SOLN
300.0000 mg | INTRAVENOUS | Status: DC
  Administered 2021-09-01: 300 mg via INTRAVENOUS
  Filled 2021-09-01: qty 15

## 2021-09-01 NOTE — Progress Notes (Signed)
Diagnosis: Iron Deficiency Anemia ? ?Provider:  Marshell Garfinkel, MD ? ?Procedure: Infusion ? ?IV Type: Peripheral, IV Location: R Antecubital ? ?Venofer (Iron Sucrose), Dose: 300 mg ? ?Infusion Start Time: 0102 ? ?Infusion Stop Time: 1230  ? ?Post Infusion IV Care: Peripheral IV Discontinued ? ?Discharge: Condition: Good, Destination: Home . AVS provided to patient.  ? ?Performed by:  Adelina Mings, LPN  ?  ?

## 2021-09-08 ENCOUNTER — Ambulatory Visit: Payer: Medicaid Other

## 2021-09-10 ENCOUNTER — Encounter: Payer: Self-pay | Admitting: Allergy & Immunology

## 2021-09-11 ENCOUNTER — Ambulatory Visit (INDEPENDENT_AMBULATORY_CARE_PROVIDER_SITE_OTHER): Payer: Medicaid Other

## 2021-09-11 VITALS — BP 114/69 | HR 71 | Temp 97.9°F | Resp 18 | Ht 71.0 in | Wt 230.2 lb

## 2021-09-11 DIAGNOSIS — D500 Iron deficiency anemia secondary to blood loss (chronic): Secondary | ICD-10-CM | POA: Diagnosis not present

## 2021-09-11 MED ORDER — SODIUM CHLORIDE 0.9 % IV SOLN
300.0000 mg | INTRAVENOUS | Status: DC
  Administered 2021-09-11: 300 mg via INTRAVENOUS
  Filled 2021-09-11: qty 15

## 2021-09-11 NOTE — Progress Notes (Signed)
Diagnosis: Iron Deficiency Anemia  Provider:  Marshell Garfinkel, MD  Procedure: Infusion  IV Type: Peripheral, IV Location: R Antecubital  Venofer (Iron Sucrose), Dose: 300 mg  Infusion Start Time: 1128  Infusion Stop Time: 7544  Post Infusion IV Care: Peripheral IV Discontinued  Discharge: Condition: Good, Destination: Home . AVS provided to patient.   Performed by:  Cleophus Molt, RN

## 2021-09-25 ENCOUNTER — Ambulatory Visit (INDEPENDENT_AMBULATORY_CARE_PROVIDER_SITE_OTHER): Payer: Medicaid Other

## 2021-09-25 VITALS — BP 111/74 | HR 80 | Temp 97.7°F | Resp 16 | Ht 71.0 in | Wt 232.6 lb

## 2021-09-25 DIAGNOSIS — D500 Iron deficiency anemia secondary to blood loss (chronic): Secondary | ICD-10-CM | POA: Diagnosis not present

## 2021-09-25 MED ORDER — SODIUM CHLORIDE 0.9 % IV SOLN
300.0000 mg | INTRAVENOUS | Status: DC
  Administered 2021-09-25: 300 mg via INTRAVENOUS
  Filled 2021-09-25: qty 15

## 2021-09-25 NOTE — Progress Notes (Signed)
Diagnosis: Iron Deficiency Anemia  Provider:  Marshell Garfinkel, MD  Procedure: Infusion  IV Type: Peripheral, IV Location: L Antecubital  Venofer (Iron Sucrose), Dose: 300 mg  Infusion Start Time: 0923  Infusion Stop Time: 1115  Post Infusion IV Care: Peripheral IV Discontinued  Discharge: Condition: Good, Destination: Home . AVS provided to patient.   Performed by:  Koren Shiver, RN

## 2021-09-29 ENCOUNTER — Encounter: Payer: Self-pay | Admitting: *Deleted

## 2021-10-13 ENCOUNTER — Inpatient Hospital Stay: Payer: Medicaid Other | Admitting: Internal Medicine

## 2021-10-13 ENCOUNTER — Inpatient Hospital Stay: Payer: Medicaid Other | Attending: Oncology

## 2021-10-19 ENCOUNTER — Ambulatory Visit: Payer: Medicaid Other | Admitting: Physician Assistant

## 2021-12-10 ENCOUNTER — Encounter (HOSPITAL_BASED_OUTPATIENT_CLINIC_OR_DEPARTMENT_OTHER): Payer: Self-pay | Admitting: Orthopedic Surgery

## 2021-12-10 NOTE — Progress Notes (Addendum)
Spoke w/ via phone for pre-op interview--- pt Lab needs dos---- urine preg (  ask MDA dos if istat needed to check pt's Hg since last one was 9.7 hg in 04/ 2023, pt did have Iron infusion since then 06/ 2023 per pt not checked since then)             Lab results------ current ekg in epic/ chart COVID test -----patient states asymptomatic no test needed Arrive at ------- 1000 on 12-15-2021 NPO after MN NO Solid Food.  Clear liquids from MN until--- 0900 Med rec completed Medications to take morning of surgery ----- prilosec, pepcid , zyrtec Diabetic medication ----- n/a Patient instructed no nail polish to be worn day of surgery Patient instructed to bring photo id and insurance card day of surgery Patient aware to have Driver (ride ) / caregiver for 24 hours after surgery ---   Pt aware to have name/ phone number of driver and caregiver when check in [place] per policy. Patient Special Instructions ----- asked to bring rescue inhaler dos Pre-Op special Istructions ----- case just added on, orders pending Patient verbalized understanding of instructions that were given at this phone interview. Patient denies shortness of breath, chest pain, fever, cough at this phone interview.
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
## 2021-12-14 NOTE — Progress Notes (Signed)
Talked with patient to verify time change to 0800 instead of 1000. Cl liquids until 0700

## 2021-12-15 ENCOUNTER — Other Ambulatory Visit: Payer: Self-pay

## 2021-12-15 ENCOUNTER — Ambulatory Visit (HOSPITAL_BASED_OUTPATIENT_CLINIC_OR_DEPARTMENT_OTHER): Payer: Medicaid Other

## 2021-12-15 ENCOUNTER — Ambulatory Visit (HOSPITAL_BASED_OUTPATIENT_CLINIC_OR_DEPARTMENT_OTHER)
Admission: RE | Admit: 2021-12-15 | Discharge: 2021-12-15 | Disposition: A | Discharge status: Home / Self Care | Payer: Medicaid Other | Attending: Orthopedic Surgery | Admitting: Orthopedic Surgery

## 2021-12-15 ENCOUNTER — Encounter (HOSPITAL_BASED_OUTPATIENT_CLINIC_OR_DEPARTMENT_OTHER): Payer: Self-pay | Admitting: Orthopedic Surgery

## 2021-12-15 ENCOUNTER — Ambulatory Visit (HOSPITAL_BASED_OUTPATIENT_CLINIC_OR_DEPARTMENT_OTHER): Payer: Medicaid Other | Admitting: Anesthesiology

## 2021-12-15 ENCOUNTER — Encounter (HOSPITAL_BASED_OUTPATIENT_CLINIC_OR_DEPARTMENT_OTHER)
Admission: RE | Disposition: A | Discharge status: Home / Self Care | Payer: Self-pay | Source: Home / Self Care | Attending: Orthopedic Surgery

## 2021-12-15 DIAGNOSIS — F418 Other specified anxiety disorders: Secondary | ICD-10-CM | POA: Diagnosis not present

## 2021-12-15 DIAGNOSIS — K219 Gastro-esophageal reflux disease without esophagitis: Secondary | ICD-10-CM | POA: Diagnosis not present

## 2021-12-15 DIAGNOSIS — M20011 Mallet finger of right finger(s): Secondary | ICD-10-CM | POA: Diagnosis present

## 2021-12-15 DIAGNOSIS — Z6832 Body mass index (BMI) 32.0-32.9, adult: Secondary | ICD-10-CM | POA: Insufficient documentation

## 2021-12-15 DIAGNOSIS — E669 Obesity, unspecified: Secondary | ICD-10-CM | POA: Insufficient documentation

## 2021-12-15 DIAGNOSIS — D6862 Lupus anticoagulant syndrome: Secondary | ICD-10-CM | POA: Diagnosis not present

## 2021-12-15 DIAGNOSIS — D500 Iron deficiency anemia secondary to blood loss (chronic): Secondary | ICD-10-CM

## 2021-12-15 DIAGNOSIS — Z01818 Encounter for other preprocedural examination: Secondary | ICD-10-CM

## 2021-12-15 DIAGNOSIS — R053 Chronic cough: Secondary | ICD-10-CM | POA: Insufficient documentation

## 2021-12-15 DIAGNOSIS — D573 Sickle-cell trait: Secondary | ICD-10-CM | POA: Insufficient documentation

## 2021-12-15 HISTORY — DX: Gastro-esophageal reflux disease without esophagitis: K21.9

## 2021-12-15 HISTORY — DX: Migraine with aura, not intractable, without status migrainosus: G43.109

## 2021-12-15 HISTORY — DX: Other constipation: K59.09

## 2021-12-15 HISTORY — DX: Lupus anticoagulant syndrome: D68.62

## 2021-12-15 HISTORY — DX: Leiomyoma of uterus, unspecified: D25.9

## 2021-12-15 HISTORY — DX: Nonrheumatic mitral (valve) insufficiency: I34.0

## 2021-12-15 HISTORY — DX: Personal history of other infectious and parasitic diseases: Z86.19

## 2021-12-15 HISTORY — DX: Chronic cough: R05.3

## 2021-12-15 HISTORY — DX: Chronic rhinitis: J31.0

## 2021-12-15 HISTORY — DX: Personal history of other specified conditions: Z87.898

## 2021-12-15 HISTORY — PX: CLOSED REDUCTION FINGER WITH PERCUTANEOUS PINNING: SHX5612

## 2021-12-15 HISTORY — DX: Mallet finger of right finger(s): M20.011

## 2021-12-15 HISTORY — DX: Excessive and frequent menstruation with regular cycle: N92.0

## 2021-12-15 LAB — POCT I-STAT, CHEM 8
BUN: 16 mg/dL (ref 6–20)
Calcium, Ion: 1.29 mmol/L (ref 1.15–1.40)
Chloride: 105 mmol/L (ref 98–111)
Creatinine, Ser: 0.9 mg/dL (ref 0.44–1.00)
Glucose, Bld: 98 mg/dL (ref 70–99)
HCT: 33 % — ABNORMAL LOW (ref 36.0–46.0)
Hemoglobin: 11.2 g/dL — ABNORMAL LOW (ref 12.0–15.0)
Potassium: 4.3 mmol/L (ref 3.5–5.1)
Sodium: 141 mmol/L (ref 135–145)
TCO2: 24 mmol/L (ref 22–32)

## 2021-12-15 LAB — POCT PREGNANCY, URINE: Preg Test, Ur: NEGATIVE

## 2021-12-15 SURGERY — CLOSED REDUCTION, FINGER, WITH PERCUTANEOUS PINNING
Anesthesia: Monitor Anesthesia Care | Site: Finger | Laterality: Right

## 2021-12-15 MED ORDER — POLYVINYL ALCOHOL 1.4 % OP SOLN
1.0000 [drp] | OPHTHALMIC | Status: DC | PRN
Start: 2021-12-15 — End: 2021-12-15
  Administered 2021-12-15: 1 [drp] via OPHTHALMIC
  Filled 2021-12-15: qty 15

## 2021-12-15 MED ORDER — ACETAMINOPHEN 500 MG PO TABS
ORAL_TABLET | ORAL | Status: AC
  Filled 2021-12-15: qty 2

## 2021-12-15 MED ORDER — MIDAZOLAM HCL 2 MG/2ML IJ SOLN
INTRAMUSCULAR | Status: AC
  Filled 2021-12-15: qty 2

## 2021-12-15 MED ORDER — HYDROMORPHONE HCL 1 MG/ML IJ SOLN: 0.2500 mg | INTRAMUSCULAR | Status: DC | PRN

## 2021-12-15 MED ORDER — CEFAZOLIN SODIUM-DEXTROSE 2-4 GM/100ML-% IV SOLN
2.0000 g | INTRAVENOUS | Status: AC
  Administered 2021-12-15: 2 g via INTRAVENOUS

## 2021-12-15 MED ORDER — PROPOFOL 10 MG/ML IV BOLUS
INTRAVENOUS | Status: DC | PRN
  Administered 2021-12-15: 40 mg via INTRAVENOUS

## 2021-12-15 MED ORDER — FENTANYL CITRATE (PF) 100 MCG/2ML IJ SOLN
INTRAMUSCULAR | Status: DC | PRN
  Administered 2021-12-15: 50 ug via INTRAVENOUS

## 2021-12-15 MED ORDER — PROPOFOL 500 MG/50ML IV EMUL
INTRAVENOUS | Status: DC | PRN
  Administered 2021-12-15: 200 ug/kg/min via INTRAVENOUS

## 2021-12-15 MED ORDER — NITROGLYCERIN 0.4 MG SL SUBL
SUBLINGUAL_TABLET | SUBLINGUAL | Status: AC
  Filled 2021-12-15: qty 1

## 2021-12-15 MED ORDER — ACETAMINOPHEN 500 MG PO TABS
1000.0000 mg | ORAL_TABLET | Freq: Once | ORAL | Status: AC
  Administered 2021-12-15: 1000 mg via ORAL

## 2021-12-15 MED ORDER — CEFAZOLIN SODIUM-DEXTROSE 2-4 GM/100ML-% IV SOLN
INTRAVENOUS | Status: AC
  Filled 2021-12-15: qty 100

## 2021-12-15 MED ORDER — AMISULPRIDE (ANTIEMETIC) 5 MG/2ML IV SOLN
10.0000 mg | Freq: Once | INTRAVENOUS | Status: DC | PRN
Start: 2021-12-15 — End: 2021-12-15

## 2021-12-15 MED ORDER — HYDROCODONE-ACETAMINOPHEN 5-325 MG PO TABS: 1.0000 | ORAL_TABLET | Freq: Four times a day (QID) | ORAL | 0 refills | Status: AC | PRN

## 2021-12-15 MED ORDER — OXYCODONE HCL 5 MG PO TABS: 5.0000 mg | ORAL_TABLET | Freq: Once | ORAL | Status: DC | PRN

## 2021-12-15 MED ORDER — MIDAZOLAM HCL 2 MG/2ML IJ SOLN
INTRAMUSCULAR | Status: DC | PRN
  Administered 2021-12-15: 1 mg via INTRAVENOUS

## 2021-12-15 MED ORDER — NITROGLYCERIN 0.4 MG SL SUBL
0.4000 mg | SUBLINGUAL_TABLET | Freq: Once | SUBLINGUAL | Status: AC
  Administered 2021-12-15: 0.4 mg via SUBLINGUAL

## 2021-12-15 MED ORDER — 0.9 % SODIUM CHLORIDE (POUR BTL) OPTIME
TOPICAL | Status: DC | PRN
  Administered 2021-12-15: 500 mL

## 2021-12-15 MED ORDER — FENTANYL CITRATE (PF) 100 MCG/2ML IJ SOLN
INTRAMUSCULAR | Status: AC
  Filled 2021-12-15: qty 2

## 2021-12-15 MED ORDER — KETOROLAC TROMETHAMINE 30 MG/ML IJ SOLN
INTRAMUSCULAR | Status: AC
  Filled 2021-12-15: qty 1

## 2021-12-15 MED ORDER — BUPIVACAINE HCL (PF) 0.5 % IJ SOLN
INTRAMUSCULAR | Status: DC | PRN
  Administered 2021-12-15: 5 mL

## 2021-12-15 MED ORDER — ONDANSETRON HCL 4 MG/2ML IJ SOLN
4.0000 mg | Freq: Once | INTRAMUSCULAR | Status: DC | PRN
Start: 2021-12-15 — End: 2021-12-15

## 2021-12-15 MED ORDER — KETOROLAC TROMETHAMINE 30 MG/ML IJ SOLN
30.0000 mg | Freq: Once | INTRAMUSCULAR | Status: AC | PRN
  Administered 2021-12-15: 30 mg via INTRAVENOUS

## 2021-12-15 MED ORDER — LIDOCAINE HCL (PF) 1 % IJ SOLN
INTRAMUSCULAR | Status: DC | PRN
  Administered 2021-12-15: 5 mL

## 2021-12-15 MED ORDER — LIDOCAINE 2% (20 MG/ML) 5 ML SYRINGE
INTRAMUSCULAR | Status: DC | PRN
  Administered 2021-12-15: 20 mg via INTRAVENOUS

## 2021-12-15 MED ORDER — LACTATED RINGERS IV SOLN: INTRAVENOUS | Status: DC

## 2021-12-15 MED ORDER — OXYCODONE HCL 5 MG/5ML PO SOLN: 5.0000 mg | Freq: Once | ORAL | Status: DC | PRN

## 2021-12-15 SURGICAL SUPPLY — 51 items
BLADE SURG 15 STRL LF DISP TIS (BLADE) ×4 IMPLANT
BLADE SURG 15 STRL SS (BLADE) ×3
BNDG CMPR 9X4 STRL LF SNTH (GAUZE/BANDAGES/DRESSINGS) ×1
BNDG COHESIVE 1X5 TAN NS LF (GAUZE/BANDAGES/DRESSINGS) IMPLANT
BNDG ELASTIC 4X5.8 VLCR STR LF (GAUZE/BANDAGES/DRESSINGS) ×2 IMPLANT
BNDG ESMARK 4X9 LF (GAUZE/BANDAGES/DRESSINGS) ×2 IMPLANT
CORD BIPOLAR FORCEPS 12FT (ELECTRODE) ×2 IMPLANT
COVER BACK TABLE 60X90IN (DRAPES) ×2 IMPLANT
CUFF TOURN SGL QUICK 18X4 (TOURNIQUET CUFF) IMPLANT
CUFF TOURN SGL QUICK 24 (TOURNIQUET CUFF)
CUFF TRNQT CYL 24X4X16.5-23 (TOURNIQUET CUFF) IMPLANT
DECANTER SPIKE VIAL GLASS SM (MISCELLANEOUS) IMPLANT
DRAPE EXTREMITY T 121X128X90 (DISPOSABLE) ×2 IMPLANT
DRAPE OEC MINIVIEW 54X84 (DRAPES) ×2 IMPLANT
DRAPE SHEET LG 3/4 BI-LAMINATE (DRAPES) ×2 IMPLANT
DRAPE SURG 17X23 STRL (DRAPES) IMPLANT
DRSG EMULSION OIL 3X3 NADH (GAUZE/BANDAGES/DRESSINGS) ×2 IMPLANT
FINGER SPLINT IMPLANT
GAUZE 4X4 16PLY ~~LOC~~+RFID DBL (SPONGE) ×2 IMPLANT
GAUZE SPONGE 4X4 12PLY STRL (GAUZE/BANDAGES/DRESSINGS) ×2 IMPLANT
GLOVE BIO SURGEON STRL SZ7.5 (GLOVE) ×4 IMPLANT
GLOVE BIOGEL PI IND STRL 7.5 (GLOVE) ×6 IMPLANT
GLOVE BIOGEL PI IND STRL 8 (GLOVE) ×4 IMPLANT
GLOVE BIOGEL PI INDICATOR 7.5 (GLOVE) ×3
GLOVE BIOGEL PI INDICATOR 8 (GLOVE) ×2
GOWN STRL REUS W/TWL LRG LVL3 (GOWN DISPOSABLE) ×4 IMPLANT
HIBICLENS CHG 4% 4OZ BTL (MISCELLANEOUS) ×2 IMPLANT
K-WIRE .045X4 (WIRE) ×1
K-WIRE DBL END TROCAR 6X.045 (WIRE)
K-WIRE DBL END TROCAR 6X.062 (WIRE)
KIT TURNOVER CYSTO (KITS) ×2 IMPLANT
KWIRE .045X4 (WIRE) IMPLANT
KWIRE DBL END TROCAR 6X.045 (WIRE) IMPLANT
KWIRE DBL END TROCAR 6X.062 (WIRE) IMPLANT
NEEDLE HYPO 22GX1.5 SAFETY (NEEDLE) IMPLANT
NS IRRIG 1000ML POUR BTL (IV SOLUTION) ×2 IMPLANT
NS IRRIG 500ML POUR BTL (IV SOLUTION) ×2 IMPLANT
PACK BASIN DAY SURGERY FS (CUSTOM PROCEDURE TRAY) ×2 IMPLANT
PAD CAST 4YDX4 CTTN HI CHSV (CAST SUPPLIES) ×4 IMPLANT
PADDING CAST COTTON 4X4 STRL (CAST SUPPLIES) ×2
SLEEVE SCD COMPRESS KNEE MED (STOCKING) IMPLANT
STRIP CLOSURE SKIN 1/2X4 (GAUZE/BANDAGES/DRESSINGS) ×2 IMPLANT
SUCTION FRAZIER HANDLE 10FR (MISCELLANEOUS)
SUCTION TUBE FRAZIER 10FR DISP (MISCELLANEOUS) IMPLANT
SUT ETHILON 4 0 PS 2 18 (SUTURE) ×2 IMPLANT
SYR 10ML LL (SYRINGE) IMPLANT
SYR BULB EAR ULCER 3OZ GRN STR (SYRINGE) ×2 IMPLANT
TOWEL OR 17X26 10 PK STRL BLUE (TOWEL DISPOSABLE) ×2 IMPLANT
TRAY DSU PREP LF (CUSTOM PROCEDURE TRAY) ×2 IMPLANT
TUBE CONNECTING 12X1/4 (SUCTIONS) IMPLANT
UNDERPAD 30X36 HEAVY ABSORB (UNDERPADS AND DIAPERS) ×2 IMPLANT

## 2021-12-15 NOTE — Anesthesia Procedure Notes (Signed)
Procedure Name: MAC Date/Time: 12/15/2021 10:18 AM  Performed by: Suan Halter, CRNAPre-anesthesia Checklist: Patient identified, Emergency Drugs available, Suction available, Patient being monitored and Timeout performed Patient Re-evaluated:Patient Re-evaluated prior to induction Oxygen Delivery Method: Simple face mask Placement Confirmation: CO2 detector Dental Injury: Teeth and Oropharynx as per pre-operative assessment

## 2021-12-15 NOTE — Op Note (Signed)
OPERATIVE NOTE  DATE OF PROCEDURE: 12/15/2021  SURGEONS:  Primary: Orene Desanctis, MD  PREOPERATIVE DIAGNOSIS: Right small bony mallet finger  POSTOPERATIVE DIAGNOSIS: Same  NAME OF PROCEDURE:   Right small finger mallet closed reduction and percutaneous pinning  ANESTHESIA: Monitor Anesthesia Care + Local  SKIN PREPARATION: Hibiclens  ESTIMATED BLOOD LOSS: Minimal  IMPLANTS: 0.045 in k wire x 1  INDICATIONS:  Latoya Guzman is a 46 y.o. female who has the above preoperative diagnosis. The patient has decided to proceed with surgical intervention.  Risks, benefits and alternatives of operative management were discussed including, but not limited to, risks of anesthesia complications, infection, pain, persistent symptoms, stiffness, need for future surgery.  The patient understands, agrees and elects to proceed with surgery.    DESCRIPTION OF PROCEDURE: The patient was met in the pre-operative area and their identity was verified.  The operative location and laterality was also verified and marked.  The patient was brought to the OR and was placed supine on the table.  After repeat patient identification with the operative team anesthesia was provided and the patient was prepped and draped in the usual sterile fashion.  A final timeout was performed verifying the correction patient, procedure, location and laterality.  After digital block was performed and adequate anesthesia to the right small finger a closed reduction of the subluxed distal phalanx was performed.  I was able to bring the distal phalanx into improved alignment.  The bony fragment remained in good position dorsally and was not pulling proximally.  The reduction was maintained and a K wire was placed in retrograde fashion across the distal interphalangeal joint.  A tourniquet was not utilized for this.  The finger remained pink and warm and well-perfused throughout the procedure.  The pin was cut beneath the skin and dressed with a  sterile bandage.  A finger splint was placed to protect the surgical site.  Patient was awoken from anesthesia and brought to PACU for recovery in stable condition.  All counts were correct x2.   Matt Holmes, MD
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
## 2021-12-15 NOTE — Addendum Note (Signed)
Addendum  created 12/15/21 1118 by Pervis Hocking, DO   Order list changed, Pharmacy for encounter modified

## 2021-12-15 NOTE — Interval H&P Note (Signed)
History and Physical Interval Note:  12/15/2021 10:06 AM  Latoya Guzman  has presented today for surgery, with the diagnosis of Right small bony mallet finger.  The various methods of treatment have been discussed with the patient and family. After consideration of risks, benefits and other options for treatment, the patient has consented to  Procedure(s) with comments: Right small finger mallet pinning (Right) - with local anesthesia as a surgical intervention.  The patient's history has been reviewed, patient examined, no change in status, stable for surgery.  I have reviewed the patient's chart and labs.  Questions were answered to the patient's satisfaction.     Orene Desanctis

## 2021-12-15 NOTE — Transfer of Care (Signed)
Immediate Anesthesia Transfer of Care Note  Patient: Latoya Guzman  Procedure(s) Performed: Procedure(s) (LRB): Right small finger mallet pinning (Right)  Patient Location: PACU  Anesthesia Type: MAC  Level of Consciousness: awake, alert , oriented and patient cooperative  Airway & Oxygen Therapy: Patient Spontanous Breathing and Patient connected to face mask oxygen  Post-op Assessment: Report given to PACU RN and Post -op Vital signs reviewed and stable  Post vital signs: Reviewed and stable  Complications: No apparent anesthesia complications Last Vitals:  Vitals Value Taken Time  BP    Temp    Pulse 69 12/15/21 1046  Resp 17 12/15/21 1046  SpO2 99 % 12/15/21 1046  Vitals shown include unvalidated device data.  Last Pain:  Vitals:   12/15/21 0848  TempSrc: Oral      Patients Stated Pain Goal: 5 (21/03/12 8118)  Complications: No notable events documented.

## 2021-12-15 NOTE — Discharge Instructions (Addendum)
  Orthopaedic Hand Surgery Discharge Instructions  WEIGHT BEARING STATUS: Non weight bearing on operative extremity  DRESSING CARE: Please keep your dressing/splint/cast clean and dry until your follow-up appointment. You may shower by placing a waterproof covering over your dressing/splint/cast. Contact your surgeon if your splint/cast gets wet. It will need to be changed to prevent skin breakdown.  PAIN CONTROL: First line medications for post operative pain control are Tylenol (acetaminophen) and Motrin (ibuprofen) if you are able to take these medications. If you have been prescribed a medication these can be taken as breakthrough pain medications. Please note that some narcotic pain medication has acetaminophen added and you should never consume more than 4,'000mg'$  of acetaminophen in 24-hour period. Please note that if you are given Toradol (ketorolac) you should not take similar medications such as ibuprofen or naproxen.  DISCHARGE MEDICATIONS: If you have been prescribed medication it was sent electronically to your pharmacy. No changes have been made to your home medications.  ICE/ELEVATION: Ice and elevate your injured extremity as needed. Avoid direct contact of ice with skin.   BANDAGE FEELS TOO TIGHT: If your bandage feels too tight, first make sure you are elevating your fingers as much as possible. The outer layer of the bandage can be unwrapped and reapplied more loosely. If no improvement, you may carefully cut the inner layer longitudinally until the pressure has resolved and then rewrap the outer layer. If you are not comfortable with these instructions, please call the office and the bandage can be changed for you.   FOLLOW UP: You will be called after surgery with an appointment date and time, however if you have not received a phone call within 3 days, please call during regular office hours at [phone] to schedule a post operative appointment.  Please Seek Medical Attention  if: Call MD for: pain or pressure in chest, jaw, arm, back, neck  Call MD for: temperature greater than 101 F for more than 24 hrs Call MD for: difficulty breathing Call MD for: incision redness, bleeding, drainage  Call MD for: palpitations or feeling that the heart is racing  Call MD for: increased swelling in arm, leg, ankle, or abdomen  Call MD for: lightheadedness, dizziness, fainting Call 911 or go to ER for any medical emergency if you are not able to get in touch with your doctor   J. Sable Feil, MD Orthopaedic Hand Surgeon [facility] Office number: [phone] [address], [address] [place], [place] [address]   Post Anesthesia Home Care Instructions  Activity: Get plenty of rest for the remainder of the day. A responsible individual must stay with you for 24 hours following the procedure.  For the next 24 hours, DO NOT: -Drive a car -Paediatric nurse -Drink alcoholic beverages -Take any medication unless instructed by your physician -Make any legal decisions or sign important papers.  Meals: Start with liquid foods such as gelatin or soup. Progress to regular foods as tolerated. Avoid greasy, spicy, heavy foods. If nausea and/or vomiting occur, drink only clear liquids until the nausea and/or vomiting subsides. Call your physician if vomiting continues.  Special Instructions/Symptoms: Your throat may feel dry or sore from the anesthesia or the breathing tube placed in your throat during surgery. If this causes discomfort, gargle with warm salt water. The discomfort should disappear within 24 hours.  No acetaminophen/Tylenol until after 2:50 pm today if needed.  No ibuprofen, Advil, Aleve, Motrin, ketorolac, meloxicam, naproxen, or other NSAIDS until after 6:17pm today if needed.

## 2021-12-15 NOTE — Anesthesia Postprocedure Evaluation (Addendum)
Anesthesia Post Note  Patient: Latoya Guzman  Procedure(s) Performed: Right small finger mallet pinning (Right: Finger)     Patient location during evaluation: PACU Anesthesia Type: MAC Level of consciousness: awake and alert Pain management: pain level controlled Vital Signs Assessment: post-procedure vital signs reviewed and stable Respiratory status: spontaneous breathing, nonlabored ventilation and respiratory function stable Cardiovascular status: blood pressure returned to baseline and stable Postop Assessment: no apparent nausea or vomiting Anesthetic complications: no Comments: Initially woke up with B/L dry, red eyes- good relief w/ artificial tears. Instructed to use PRN postop after discharge.  Upon checking on pt in phase 2, stated that she was having a headache and would like medication for it- toradol given w/ good effect.  Roughly 53mn later, RN advised me that pt was having chest pain. Upon questioning, this chest pain is similar to what she experiences every few months. She has had a full cardiac workup in 2021 with no etiology identified. She normally takes sublingual nitroglycerin with good effect- will give one dose in phase 2. EKG in phase 2 normal.   No notable events documented.  Last Vitals:  Vitals:   12/15/21 1045 12/15/21 1100  BP: (!) 167/81 118/83  Pulse: 69 (!) 57  Resp: 13 15  Temp: 36.6 C   SpO2: 99% 100%    Last Pain:  Vitals:   12/15/21 1100  TempSrc:   PainSc: 0-No pain                 EPervis Hocking

## 2021-12-15 NOTE — Addendum Note (Signed)
Addendum  created 12/15/21 1234 by Pervis Hocking, DO   Clinical Note Signed, Order list changed, Pharmacy for encounter modified

## 2021-12-15 NOTE — Progress Notes (Signed)
Patient complain of chest pain rated an 8 on pain scale that radiates around to under right breast. Dr. Elgie Congo notified via phone. EKG ordered and verbal order for nitroglycerin given.

## 2021-12-16 ENCOUNTER — Encounter (HOSPITAL_BASED_OUTPATIENT_CLINIC_OR_DEPARTMENT_OTHER): Payer: Self-pay | Admitting: Orthopedic Surgery

## 2022-03-03 ENCOUNTER — Other Ambulatory Visit: Payer: Self-pay | Admitting: Allergy & Immunology

## 2022-04-22 ENCOUNTER — Other Ambulatory Visit (HOSPITAL_COMMUNITY): Payer: Self-pay

## 2022-04-22 ENCOUNTER — Encounter: Payer: Self-pay | Admitting: Internal Medicine

## 2022-04-29 ENCOUNTER — Ambulatory Visit: Payer: Medicaid Other

## 2022-04-30 ENCOUNTER — Ambulatory Visit: Payer: Medicaid Other | Admitting: Student

## 2022-05-01 ENCOUNTER — Encounter (HOSPITAL_COMMUNITY): Payer: Self-pay | Admitting: *Deleted

## 2022-05-01 ENCOUNTER — Ambulatory Visit (HOSPITAL_COMMUNITY)
Admission: EM | Admit: 2022-05-01 | Discharge: 2022-05-01 | Disposition: A | Discharge status: Home / Self Care | Payer: Medicaid Other | Attending: Emergency Medicine | Admitting: Emergency Medicine

## 2022-05-01 ENCOUNTER — Other Ambulatory Visit: Payer: Self-pay

## 2022-05-01 DIAGNOSIS — J069 Acute upper respiratory infection, unspecified: Secondary | ICD-10-CM

## 2022-05-01 DIAGNOSIS — Z3202 Encounter for pregnancy test, result negative: Secondary | ICD-10-CM

## 2022-05-01 DIAGNOSIS — R1032 Left lower quadrant pain: Secondary | ICD-10-CM

## 2022-05-01 LAB — POC URINE PREG, ED: Preg Test, Ur: NEGATIVE

## 2022-05-01 MED ORDER — PROMETHAZINE-DM 6.25-15 MG/5ML PO SYRP: 5.0000 mL | ORAL_SOLUTION | Freq: Four times a day (QID) | ORAL | 0 refills | Status: DC | PRN

## 2022-05-01 MED ORDER — BENZONATATE 100 MG PO CAPS: 100.0000 mg | ORAL_CAPSULE | Freq: Three times a day (TID) | ORAL | 0 refills | Status: DC

## 2022-05-01 MED ORDER — TIZANIDINE HCL 4 MG PO TABS
4.0000 mg | ORAL_TABLET | Freq: Four times a day (QID) | ORAL | 0 refills | Status: AC | PRN
Start: 2022-05-01 — End: ?

## 2022-05-01 NOTE — ED Triage Notes (Signed)
Pt reports Lt sided ABD pain for 2 weeks. Pt has had muscle pain with movement ,cough and chills. Pt reports las period was 2 months ago.Nausea for 2 months

## 2022-05-01 NOTE — Discharge Instructions (Addendum)
For your abdominal pain - Urine pregnancy is negative - Abdominal pain and irregular bleeding is most likely result of uterine fibroids and they will need further evaluation and imaging - Please notify your primary doctor of symptoms on Monday and schedule appointment for follow-up - At any point if your symptoms worsen in severity please go to the nearest emergency department for immediate evaluation  For body aches and cough - Due to your known exposure and as your child has influenza you most likely have it to however as it has been 5 days we are unable to prescribe Tamiflu at this time - She may use Tessalon pill every 8 hours for coughing - She may use cough syrup every 6 hours for additional comfort, be mindful this may make you feel drastically - You may use muscle relaxer every 6 hours as needed for management of body aches along with Tylenol and/or ibuprofen, be mindful muscle relaxers will make you feel sleepy
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
## 2022-05-10 ENCOUNTER — Ambulatory Visit: Payer: Medicaid Other | Admitting: Student

## 2022-05-10 NOTE — Progress Notes (Deleted)
  SUBJECTIVE:   CHIEF COMPLAINT / HPI:   Patient presents today for urgent care follow-up regarding irregular menstrual bleeding.  Of note, transvaginal ultrasound on 03/05/2019 showed 2 small subserosal leiomyomata within the uterus and poorly delineated endometrial complex.  She was referred to OB/GYN and was seen on 04/06/2019 and was advised that ultrasound was normal.  Further notation on 04/25/2019 show advised for hysterectomy, D&C and removal of any polyps or fibroids and additionally endometrial ablation at that time.  Patient was to consider options and discussed with them.  PERTINENT  PMH / PSH: History of uterine fibroids, tubal ligation, C-section x 3, D&C  OBJECTIVE:  LMP 01/24/2022 (Approximate) Comment: Last period 2 months ago Physical Exam   ASSESSMENT/PLAN:  There are no diagnoses linked to this encounter. No follow-ups on file. Wells Guiles, DO 05/10/2022, 12:54 PM PGY-***, [facility] {    This will disappear when note is signed, click to select method of visit    :1}

## 2022-05-24 ENCOUNTER — Emergency Department (HOSPITAL_COMMUNITY)
Admission: EM | Admit: 2022-05-24 | Discharge: 2022-05-24 | Disposition: A | Discharge status: Home / Self Care | Payer: Medicaid Other | Attending: Emergency Medicine | Admitting: Emergency Medicine

## 2022-05-24 ENCOUNTER — Emergency Department (HOSPITAL_COMMUNITY): Payer: Medicaid Other

## 2022-05-24 ENCOUNTER — Other Ambulatory Visit: Payer: Self-pay

## 2022-05-24 ENCOUNTER — Encounter (HOSPITAL_COMMUNITY): Payer: Self-pay | Admitting: *Deleted

## 2022-05-24 DIAGNOSIS — R0789 Other chest pain: Secondary | ICD-10-CM | POA: Insufficient documentation

## 2022-05-24 DIAGNOSIS — R079 Chest pain, unspecified: Secondary | ICD-10-CM | POA: Diagnosis present

## 2022-05-24 DIAGNOSIS — Z91040 Latex allergy status: Secondary | ICD-10-CM | POA: Diagnosis not present

## 2022-05-24 DIAGNOSIS — R739 Hyperglycemia, unspecified: Secondary | ICD-10-CM | POA: Insufficient documentation

## 2022-05-24 LAB — I-STAT BETA HCG BLOOD, ED (MC, WL, AP ONLY): I-stat hCG, quantitative: <5 m[IU]/mL (ref <5)

## 2022-05-24 LAB — BASIC METABOLIC PANEL
Anion gap: 9 (ref 5–15)
BUN: 13 mg/dL (ref 6–20)
CO2: 22 mmol/L (ref 22–32)
Calcium: 9.1 mg/dL (ref 8.9–10.3)
Chloride: 105 mmol/L (ref 98–111)
Creatinine, Ser: 0.98 mg/dL (ref 0.44–1.00)
GFR, Estimated: >60 mL/min (ref >60)
Glucose, Bld: 104 mg/dL — ABNORMAL HIGH (ref 70–99)
Potassium: 4 mmol/L (ref 3.5–5.1)
Sodium: 136 mmol/L (ref 135–145)

## 2022-05-24 LAB — CBC
HCT: 30 % — ABNORMAL LOW (ref 36.0–46.0)
Hemoglobin: 9.9 g/dL — ABNORMAL LOW (ref 12.0–15.0)
MCH: 22.3 pg — ABNORMAL LOW (ref 26.0–34.0)
MCHC: 33 g/dL (ref 30.0–36.0)
MCV: 67.7 fL — ABNORMAL LOW (ref 80.0–100.0)
Platelets: 282 10*3/uL (ref 150–400)
RBC: 4.43 MIL/uL (ref 3.87–5.11)
RDW: 17.4 % — ABNORMAL HIGH (ref 11.5–15.5)
WBC: 5 10*3/uL (ref 4.0–10.5)
nRBC: 0 % (ref 0.0–0.2)

## 2022-05-24 LAB — TROPONIN I (HIGH SENSITIVITY)
Troponin I (High Sensitivity): 3 ng/L (ref <18)
Troponin I (High Sensitivity): 4 ng/L (ref <18)

## 2022-05-24 LAB — D-DIMER, QUANTITATIVE: D-Dimer, Quant: 0.31 ug/mL-FEU (ref 0.00–0.50)

## 2022-05-24 NOTE — ED Triage Notes (Signed)
Pt is here with midsternal chest pressure that woke her up this morning. No sob or radiation. Pt states she has had this before but nothing has been diagnosed.

## 2022-05-24 NOTE — ED Provider Notes (Signed)
[facility] Provider Note   CSN: [id number] Arrival date & time: 05/24/22  0544     History  Chief Complaint  Patient presents with   Chest Pain    Latoya Guzman is a 47 y.o. female.  Who presents emergency department chief complaint of chest pain.  Patient states that she had pain that woke her out of her sleep this morning.  She states that she felt like she has "heard of elephants on my chest."  She states that her pain has been lessened over time.  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  She denies any active leg swelling or hemoptysis.  Chest Pain      Home Medications Prior to Admission medications   Medication Sig Start Date End Date Taking? Authorizing Provider  albuterol (VENTOLIN HFA) 108 (90 Base) MCG/ACT inhaler Inhale 2 puffs into the lungs every 4 (four) hours as needed for wheezing or shortness of breath. 05/21/21   Valentina Shaggy, MD  benzonatate (TESSALON) 100 MG capsule Take 1 capsule (100 mg total) by mouth every 8 (eight) hours. 05/01/22   Hans Eden, NP  Cetirizine HCl 10 MG CAPS Take 1 capsule (10 mg total) by mouth daily. 12/24/20 12/10/21  Carollee Leitz, MD  DULoxetine (CYMBALTA) 30 MG capsule Take 30 mg by mouth daily. 04/02/20   [provider]  famotidine (PEPCID) 20 MG tablet TAKE 1 TABLET(20 MG) BY MOUTH TWICE DAILY 03/03/22   Valentina Shaggy, MD  ferrous sulfate 324 (65 Fe) MG TBEC Take 1 tablet (324 mg total) by mouth in the morning and at bedtime. Patient not taking: Reported on 12/10/2021 12/24/20   Carollee Leitz, MD  fluticasone [facility]) 50 MCG/ACT nasal spray Place 2 sprays into both nostrils daily. Patient taking differently: Place 2 sprays into both nostrils daily as needed. 12/24/20   Carollee Leitz, MD  LINZESS 290 MCG CAPS capsule Take 290 mcg by mouth daily as needed. 01/28/21    [provider]  NURTEC 75 MG TBDP TAKE 1 TABLET BY MOUTH DAILY AS NEED*MAX 1 TABLET IN 24 HOURS* Patient not taking: Reported on 12/10/2021 01/27/21   Pieter Partridge, DO  omeprazole (PRILOSEC) 40 MG capsule Take 1 capsule (40 mg total) by mouth in the morning and at bedtime. Patient taking differently: Take 40 mg by mouth in the morning and at bedtime. 05/21/21 12/15/21  Valentina Shaggy, MD  phentermine (ADIPEX-P) 37.5 MG tablet Take 37.5 mg by mouth daily. 05/08/21   [provider]  promethazine-dextromethorphan (PROMETHAZINE-DM) 6.25-15 MG/5ML syrup Take 5 mLs by mouth 4 (four) times daily as needed for cough. 05/01/22   White, Leitha Schuller, NP  tiZANidine (ZANAFLEX) 4 MG tablet Take 1 tablet (4 mg total) by mouth every 6 (six) hours as needed for muscle spasms. 05/01/22   Hans Eden, NP      Allergies    Sumatriptan, Methocarbamol, Latex, and Morphine and related    Review of Systems   Review of Systems  Cardiovascular:  Positive for chest pain.    Physical Exam Updated Vital Signs BP 113/78 (BP Location: Left Arm)   Pulse 68   Temp 99 F (37.2 C) (Oral)   Resp 18   LMP 01/24/2022 (Approximate) Comment: Last period 2 months ago  SpO2 100%  Physical Exam Vitals and nursing note reviewed.  Constitutional:  General: She is not in acute distress.    Appearance: She is well-developed. She is not diaphoretic.  HENT:     Head: Normocephalic and atraumatic.     Right Ear: External ear normal.     Left Ear: External ear normal.     Nose: Nose normal.     Mouth/Throat:     Mouth: Mucous membranes are moist.  Eyes:     General: No scleral icterus.    Conjunctiva/sclera: Conjunctivae normal.  Cardiovascular:     Rate and Rhythm: Normal rate and regular rhythm.     Heart sounds: Normal heart sounds. No murmur heard.    No friction rub. No gallop.  Pulmonary:     Effort: Pulmonary effort is normal. No respiratory distress.     Breath sounds: Normal breath  sounds.  Abdominal:     General: Bowel sounds are normal. There is no distension.     Palpations: Abdomen is soft. There is no mass.     Tenderness: There is no abdominal tenderness. There is no guarding.  Musculoskeletal:     Cervical back: Normal range of motion.  Skin:    General: Skin is warm and dry.  Neurological:     Mental Status: She is alert and oriented to person, place, and time.  Psychiatric:        Behavior: Behavior normal.     ED Results / Procedures / Treatments   Labs (all labs ordered are listed, but only abnormal results are displayed) Labs Reviewed  BASIC METABOLIC PANEL - Abnormal; Notable for the following components:      Result Value   Glucose, Bld 104 (*)    All other components within normal limits  CBC - Abnormal; Notable for the following components:   Hemoglobin 9.9 (*)    HCT 30.0 (*)    MCV 67.7 (*)    MCH 22.3 (*)    RDW 17.4 (*)    All other components within normal limits  I-STAT BETA HCG BLOOD, ED ([facility], [facility], [facility] ONLY)  TROPONIN I (HIGH SENSITIVITY)  TROPONIN I (HIGH SENSITIVITY)    EKG None  Radiology DG Chest 2 View  Result Date: 05/24/2022 CLINICAL DATA:  Chest pain and pressure. EXAM: CHEST - 2 VIEW COMPARISON:  07/11/2021 FINDINGS: The lungs are clear without focal pneumonia, edema, pneumothorax or pleural effusion. The cardiopericardial silhouette is within normal limits for size. The visualized bony structures of the thorax are unremarkable. IMPRESSION: No active cardiopulmonary disease. Electronically Signed   By: Misty Stanley M.D.   On: 05/24/2022 06:33    Procedures Procedures    Medications Ordered in ED Medications - No data to display  ED Course/ Medical Decision Making/ A&P Clinical Course as of 05/24/22 1454  Mon May 24, 2022  1430 Hemoglobin(!): 9.9 [AH]  1430 DG Chest 2 View [AH]  1452 I have reviewed the patient's labs.  She has 2 negative troponins.  Hemoglobin is 9.9 and is baseline for the patient.  BMP with  only mildly elevated blood glucose.  [AH]  4825 DG Chest 2 View I visualized and interpreted two-view chest x-ray shows no acute findings [AH]  1453 ED EKG I visualized and interpreted EKG.  Patient's EKG does show new changes that include right axis deviation and LVH pattern.  Given these findings will obtain a D-dimer.  Patient is okay with continuing the workup.  I explained my concern for rule out PE given her history. [AH]    Clinical Course  User Index [AH] Margarita Mail, PA-C                             Medical Decision Making Given the large differential diagnosis for Latoya Guzman, the decision making in this case is of high complexity.  After evaluating all of the data points in this case, the presentation of Latoya Guzman is NOT consistent with Acute Coronary Syndrome (ACS) and/or myocardial ischemia, pulmonary embolism, aortic dissection; Borhaave's, significant arrythmia, pneumothorax, cardiac tamponade, or other emergent cardiopulmonary condition.  Further, the presentation of Latoya Guzman is NOT consistent with pericarditis, myocarditis, cholecystitis, pancreatitis, mediastinitis, endocarditis, new valvular disease.  Additionally, the presentation of Latoya Couse Jonesis NOT consistent with flail chest, cardiac contusion, ARDS, or significant intra-thoracic or intra-abdominal bleeding.  Moreover, this presentation is NOT consistent with pneumonia, sepsis, or pyelonephritis.    Strict return and follow-up precautions have been given by me personally or by detailed written instruction given verbally by nursing staff using the teach back method to the patient/family/caregiver(s).  Data Reviewed/Counseling: I have reviewed the patient's vital signs, nursing notes, and other relevant tests/information. I had a detailed discussion regarding the historical points, exam findings, and any diagnostic results supporting the discharge diagnosis. I also discussed the need for  outpatient follow-up and the need to return to the ED if symptoms worsen or if there are any questions or concerns that arise at home.    Amount and/or Complexity of Data Reviewed Labs: ordered. Decision-making details documented in ED Course.    Details: 2 negative troponins, negative D-dimer, negative pregnancy test.  Hemoglobin at baseline Radiology: ordered and independent interpretation performed. Decision-making details documented in ED Course.    Details: I visualized interpreted two-view chest x-ray which shows no acute findings ECG/medicine tests: independent interpretation performed. Decision-making details documented in ED Course.          Final Clinical Impression(s) / ED Diagnoses Final diagnoses:  Atypical chest pain    Rx / DC Orders ED Discharge Orders     None         Margarita Mail, PA-C 05/24/22 1645    Cristie Hem, MD 05/25/22 [phone]

## 2022-05-24 NOTE — ED Provider Triage Note (Signed)
Emergency Medicine Provider Triage Evaluation Note  Latoya Guzman , a 47 y.o. female  was evaluated in triage.  Pt complains of substernal chest pressure that woke her from her sleep this morning with initial associated shortness of breath not resolved.  No palpitations.  Radiated to her left arm.  No nausea vomiting.  History of the same.  History of lupus anticoagulant disorder/antiphospholipid antibody syndrome.  Review of Systems  Positive: As above Negative: As above  Physical Exam  BP 117/69 (BP Location: Right Arm)   Pulse 77   Temp 99 F (37.2 C) (Oral)   Resp 18   LMP 01/24/2022 (Approximate) Comment: Last period 2 months ago  SpO2 100%  Gen:   Awake, no distress   Resp:  Normal effort  MSK:   Moves extremities without difficulty  Other:  RRR no setters is G.  Lungs CTAB.  No lower extremity edema.  Medical Decision Making  Medically screening exam initiated at 6:18 AM.  Appropriate orders placed.  [name] was informed that the remainder of the evaluation will be completed by another provider, this initial triage assessment does not replace that evaluation, and the importance of remaining in the ED until their evaluation is complete.  This chart was dictated using voice recognition software, Dragon. Despite the best efforts of this provider to proofread and correct errors, errors may still occur which can change documentation meaning.    Emeline Darling, PA-C 05/24/22 [phone]

## 2022-05-24 NOTE — Discharge Instructions (Signed)

## 2022-06-01 ENCOUNTER — Other Ambulatory Visit: Payer: Self-pay | Admitting: Allergy & Immunology

## 2022-06-21 ENCOUNTER — Ambulatory Visit (INDEPENDENT_AMBULATORY_CARE_PROVIDER_SITE_OTHER): Payer: Medicaid Other | Admitting: Family Medicine

## 2022-06-21 VITALS — BP 125/80 | HR 114 | Ht 71.0 in | Wt 244.8 lb

## 2022-06-21 DIAGNOSIS — R1013 Epigastric pain: Secondary | ICD-10-CM

## 2022-06-21 LAB — POCT URINE PREGNANCY: Preg Test, Ur: NEGATIVE

## 2022-06-21 MED ORDER — LOPERAMIDE HCL 2 MG PO CAPS: 2.0000 mg | ORAL_CAPSULE | ORAL | 0 refills | Status: DC | PRN

## 2022-06-21 MED ORDER — ONDANSETRON 4 MG PO TBDP: 4.0000 mg | ORAL_TABLET | Freq: Three times a day (TID) | ORAL | 0 refills | Status: DC | PRN

## 2022-06-21 NOTE — Progress Notes (Unsigned)
  Date of Visit: 06/21/2022   SUBJECTIVE:   HPI:  Latoya Guzman presents today for a same day appointment to discuss abdominal pain.  Pain first began about 1.5 weeks ago.  Pain is located in the epigastric area and she describes it as aching/gnawing.  It is significantly worse whenever she eats.  Noted it has been even worse when she had fried foods (chicken wings).  It has been off and on since it first began.  Anytime she eats she is having diarrhea shortly thereafter.  No blood in her stool.  Has had chills/feeling hot, which she says is common for her due to her anemia, but no known fevers.  Generally feels weak and exhausted.  She has tried Pepto-Bismol and ginger ale without significant relief.  No vomiting but does feel nauseated.  No history of this in the past.  Past medical history: Antiphospholipid antibody positive (multiple pregnancy losses), depression, menorrhagia, GERD, migraine, sickle cell trait. Last visit here to family medicine was August 2022.  OBJECTIVE:   BP 125/80   Pulse (!) 114   Ht 5' 11"$  (1.803 m)   Wt 244 lb 12.8 oz (111 kg)   SpO2 99%   BMI 34.14 kg/m  Gen: No acute distress, pleasant, cooperative, well-appearing HEENT: Normocephalic, atraumatic, no oral lesions, moist mucous membranes Heart: Mildly tachycardic, regular rhythm, no murmur Lungs: Clear to auscultation bilaterally, normal effort Neuro: Alert, grossly nonfocal, speech normal Abdomen: Soft, mildly tender to palpation in epigastric and right upper quadrant areas.  Negative Murphy sign.  Normoactive bowel sounds.  No tenderness in lower abdomen. Ext: No edema  ASSESSMENT/PLAN:   Health maintenance:  -Due for Pap and flu shot, advised to schedule physical with PCP for this  Epigastric pain History and exam concerning for gallbladder pathology, though differential also includes prolonged viral gastroenteritis, peptic ulcer, IBS/IBD.  She is overall well-appearing without an acute abdomen at this  time so I think outpatient evaluation is reasonable. Plan: -Right upper quadrant ultrasound, ordered stat to expedite scheduling -Labs today: CBC with differential, CMET, lipase, urine pregnancy test -Sent in Zofran and Imodium to treat nausea and diarrhea. -Discussed return precautions including worsening pain, inability to take p.o., or fever  Latoya Guzman, [place]

## 2022-06-21 NOTE — Patient Instructions (Addendum)
It was great to see you again today.  Ordering ultrasound and lab work today.  I am concerned about your gallbladder being the cause of this pain.  I sent in medications for you for nausea and diarrhea.  Please schedule a physical with your PCP to update things like flu shot and Pap smear.  If you have worsening pain, fever, or are unable to keep any liquids down, please go to the emergency room.  Be well, Dr. Ardelia Mems

## 2022-06-22 ENCOUNTER — Telehealth: Payer: Self-pay

## 2022-06-22 LAB — CBC WITH DIFFERENTIAL/PLATELET
Basophils Absolute: 0 10*3/uL (ref 0.0–0.2)
Basos: 1 %
EOS (ABSOLUTE): 0.1 10*3/uL (ref 0.0–0.4)
Eos: 2 %
Hematocrit: 32 % — ABNORMAL LOW (ref 34.0–46.6)
Hemoglobin: 9.7 g/dL — ABNORMAL LOW (ref 11.1–15.9)
Immature Grans (Abs): 0 10*3/uL (ref 0.0–0.1)
Immature Granulocytes: 0 %
Lymphocytes Absolute: 0.9 10*3/uL (ref 0.7–3.1)
Lymphs: 24 %
MCH: 20.9 pg — ABNORMAL LOW (ref 26.6–33.0)
MCHC: 30.3 g/dL — ABNORMAL LOW (ref 31.5–35.7)
MCV: 69 fL — ABNORMAL LOW (ref 79–97)
Monocytes Absolute: 0.5 10*3/uL (ref 0.1–0.9)
Monocytes: 12 %
Neutrophils Absolute: 2.3 10*3/uL (ref 1.4–7.0)
Neutrophils: 61 %
Platelets: 301 10*3/uL (ref 150–450)
RBC: 4.64 x10E6/uL (ref 3.77–5.28)
RDW: 18.3 % — ABNORMAL HIGH (ref 11.7–15.4)
WBC: 3.8 10*3/uL (ref 3.4–10.8)

## 2022-06-22 LAB — CMP14+EGFR
ALT: 17 IU/L (ref 0–32)
AST: 22 IU/L (ref 0–40)
Albumin/Globulin Ratio: 1.4 (ref 1.2–2.2)
Albumin: 4.2 g/dL (ref 3.9–4.9)
Alkaline Phosphatase: 121 IU/L (ref 44–121)
BUN/Creatinine Ratio: 8 — ABNORMAL LOW (ref 9–23)
BUN: 8 mg/dL (ref 6–24)
Bilirubin Total: <0.2 mg/dL (ref 0.0–1.2)
CO2: 21 mmol/L (ref 20–29)
Calcium: 8.6 mg/dL — ABNORMAL LOW (ref 8.7–10.2)
Chloride: 105 mmol/L (ref 96–106)
Creatinine, Ser: 1.01 mg/dL — ABNORMAL HIGH (ref 0.57–1.00)
Globulin, Total: 2.9 g/dL (ref 1.5–4.5)
Glucose: 82 mg/dL (ref 70–99)
Potassium: 3.8 mmol/L (ref 3.5–5.2)
Sodium: 141 mmol/L (ref 134–144)
Total Protein: 7.1 g/dL (ref 6.0–8.5)
eGFR: 70 mL/min/{1.73_m2} (ref >59)

## 2022-06-22 LAB — LIPASE: Lipase: 56 U/L (ref 14–72)

## 2022-06-22 NOTE — Telephone Encounter (Signed)
Called patient with upcoming appointment at   [facility] US Abdomen Limited RUQ (Liver/GB) 06/29/2022 @ 0900 Arrival at 0830 Patient must be NPO after midnight  .Ozella Almond, CMA

## 2022-06-29 ENCOUNTER — Ambulatory Visit (HOSPITAL_COMMUNITY)
Admission: RE | Admit: 2022-06-29 | Discharge: 2022-06-29 | Disposition: A | Discharge status: Home / Self Care | Payer: Medicaid Other | Source: Ambulatory Visit | Attending: Family Medicine | Admitting: Family Medicine

## 2022-06-29 DIAGNOSIS — R1013 Epigastric pain: Secondary | ICD-10-CM | POA: Diagnosis not present

## 2022-06-30 ENCOUNTER — Telehealth: Payer: Self-pay | Admitting: Family Medicine

## 2022-06-30 DIAGNOSIS — R1013 Epigastric pain: Secondary | ICD-10-CM

## 2022-06-30 NOTE — Telephone Encounter (Signed)
Attempted to reach patient by phone to discuss results. No answer. Left HIPAA-compliant voicemail asking her to call back.  Leeanne Rio, MD

## 2022-07-01 NOTE — Telephone Encounter (Signed)
Patient LVM on nurse line returning call in regards to results.   Will forward to provider.

## 2022-07-02 NOTE — Telephone Encounter (Signed)
Patient calls nurse line again requesting [place] results.   She reports you can leave a detailed message if she does not answer.

## 2022-07-02 NOTE — Telephone Encounter (Signed)
Called patient and left vm with info about u/s - gallbladder and pancreas look good. Possible hemangioma, consider MRI. Will wait to hear back from patient to discuss more.  Leeanne Rio, MD

## 2022-07-07 MED ORDER — SUCRALFATE 1 G PO TABS: 1.0000 g | ORAL_TABLET | Freq: Three times a day (TID) | ORAL | 1 refills | Status: DC

## 2022-07-07 MED ORDER — FAMOTIDINE 20 MG PO TABS: ORAL_TABLET | ORAL | 0 refills | Status: DC

## 2022-07-07 NOTE — Telephone Encounter (Signed)
And again and was able to reach her.  Discussed CT scan results which is suggestive of possible liver hemangioma.  Patient reports she is having ongoing epigastric pain especially after eating.  She almost went to the emergency room with it.  Discussed that we should probably pursue additional imaging of her belly.  At this time I am going to order a CT scan as it may give [place] more info about the liver hemangioma as well as the rest of her abdomen.  The radiologist had recommended considering an MRI just to evaluate the liver lesion, but a CT seems more appropriate given we need to look at other structures as well.  Also send in Carafate.  Refilled Pepcid.  She is taking Pepcid and omeprazole.  Hemoglobin low consistent with prior values at 9.7.  She reports getting iron infusions as recently as December.  Encouraged her to continue taking iron daily.  As she will be due for a screening colonoscopy in August anyways when she turns 40, I am getting go ahead and refer her to GI for evaluation of abdominal pain and iron deficiency anemia.    She also mentioned having severe intermittent anal pain lasting 10 to 15 minutes at a time, possibly proctalgia fugax versus hemorrhoids.  I did not perform a rectal exam at her recent visit since her pain was all epigastric.  This can be examined when she sees GI.  Patient is agreeable with this plan.  Leeanne Rio, MD

## 2022-07-07 NOTE — Addendum Note (Signed)
Addended by: Leeanne Rio on: 07/07/2022 11:40 AM   Modules accepted: Orders

## 2022-07-09 NOTE — Telephone Encounter (Signed)
Pt scheduled and informed. Toriann Spadoni, CMA  

## 2022-07-14 ENCOUNTER — Emergency Department (HOSPITAL_BASED_OUTPATIENT_CLINIC_OR_DEPARTMENT_OTHER)
Admission: EM | Admit: 2022-07-14 | Discharge: 2022-07-14 | Disposition: A | Discharge status: Home / Self Care | Payer: Medicaid Other | Attending: Emergency Medicine | Admitting: Emergency Medicine

## 2022-07-14 ENCOUNTER — Encounter: Payer: Self-pay | Admitting: Family Medicine

## 2022-07-14 ENCOUNTER — Emergency Department (HOSPITAL_BASED_OUTPATIENT_CLINIC_OR_DEPARTMENT_OTHER): Payer: Medicaid Other

## 2022-07-14 ENCOUNTER — Encounter (HOSPITAL_BASED_OUTPATIENT_CLINIC_OR_DEPARTMENT_OTHER): Payer: Self-pay | Admitting: Urology

## 2022-07-14 ENCOUNTER — Ambulatory Visit (INDEPENDENT_AMBULATORY_CARE_PROVIDER_SITE_OTHER): Payer: Medicaid Other | Admitting: Family Medicine

## 2022-07-14 ENCOUNTER — Telehealth: Payer: Self-pay

## 2022-07-14 ENCOUNTER — Ambulatory Visit (HOSPITAL_COMMUNITY)
Admission: RE | Admit: 2022-07-14 | Discharge: 2022-07-14 | Disposition: A | Discharge status: Home / Self Care | Payer: Medicaid Other | Source: Ambulatory Visit | Attending: Family Medicine | Admitting: Family Medicine

## 2022-07-14 VITALS — BP 127/71 | HR 89 | Wt 247.0 lb

## 2022-07-14 DIAGNOSIS — R2243 Localized swelling, mass and lump, lower limb, bilateral: Secondary | ICD-10-CM | POA: Diagnosis present

## 2022-07-14 DIAGNOSIS — Z91040 Latex allergy status: Secondary | ICD-10-CM | POA: Diagnosis not present

## 2022-07-14 DIAGNOSIS — N921 Excessive and frequent menstruation with irregular cycle: Secondary | ICD-10-CM

## 2022-07-14 DIAGNOSIS — R600 Localized edema: Secondary | ICD-10-CM

## 2022-07-14 DIAGNOSIS — R1013 Epigastric pain: Secondary | ICD-10-CM

## 2022-07-14 DIAGNOSIS — R519 Headache, unspecified: Secondary | ICD-10-CM | POA: Diagnosis not present

## 2022-07-14 LAB — CBC WITH DIFFERENTIAL/PLATELET
Abs Immature Granulocytes: 0.02 10*3/uL (ref 0.00–0.07)
Basophils Absolute: 0 10*3/uL (ref 0.0–0.1)
Basophils Relative: 1 %
Eosinophils Absolute: 0.1 10*3/uL (ref 0.0–0.5)
Eosinophils Relative: 2 %
HCT: 28.7 % — ABNORMAL LOW (ref 36.0–46.0)
Hemoglobin: 9.2 g/dL — ABNORMAL LOW (ref 12.0–15.0)
Immature Granulocytes: 0 %
Lymphocytes Relative: 29 %
Lymphs Abs: 1.6 10*3/uL (ref 0.7–4.0)
MCH: 21 pg — ABNORMAL LOW (ref 26.0–34.0)
MCHC: 32.1 g/dL (ref 30.0–36.0)
MCV: 65.5 fL — ABNORMAL LOW (ref 80.0–100.0)
Monocytes Absolute: 0.7 10*3/uL (ref 0.1–1.0)
Monocytes Relative: 12 %
Neutro Abs: 3 10*3/uL (ref 1.7–7.7)
Neutrophils Relative %: 56 %
Platelets: 255 10*3/uL (ref 150–400)
RBC: 4.38 MIL/uL (ref 3.87–5.11)
RDW: 17.7 % — ABNORMAL HIGH (ref 11.5–15.5)
WBC: 5.4 10*3/uL (ref 4.0–10.5)
nRBC: 0 % (ref 0.0–0.2)

## 2022-07-14 LAB — COMPREHENSIVE METABOLIC PANEL
ALT: 23 U/L (ref 0–44)
AST: 30 U/L (ref 15–41)
Albumin: 3.7 g/dL (ref 3.5–5.0)
Alkaline Phosphatase: 68 U/L (ref 38–126)
Anion gap: 5 (ref 5–15)
BUN: 15 mg/dL (ref 6–20)
CO2: 22 mmol/L (ref 22–32)
Calcium: 8.5 mg/dL — ABNORMAL LOW (ref 8.9–10.3)
Chloride: 106 mmol/L (ref 98–111)
Creatinine, Ser: 0.88 mg/dL (ref 0.44–1.00)
GFR, Estimated: >60 mL/min (ref >60)
Glucose, Bld: 95 mg/dL (ref 70–99)
Potassium: 4 mmol/L (ref 3.5–5.1)
Sodium: 133 mmol/L — ABNORMAL LOW (ref 135–145)
Total Bilirubin: 0.5 mg/dL (ref 0.3–1.2)
Total Protein: 7.2 g/dL (ref 6.5–8.1)

## 2022-07-14 LAB — PROTIME-INR
INR: 1 (ref 0.8–1.2)
Prothrombin Time: 13.4 seconds (ref 11.4–15.2)

## 2022-07-14 LAB — SEDIMENTATION RATE: Sed Rate: 15 mm/hr (ref 0–22)

## 2022-07-14 MED ORDER — ONDANSETRON HCL 4 MG/2ML IJ SOLN: 4.0000 mg | Freq: Once | INTRAMUSCULAR | Status: DC

## 2022-07-14 MED ORDER — IOHEXOL 350 MG/ML SOLN
75.0000 mL | Freq: Once | INTRAVENOUS | Status: AC | PRN
  Administered 2022-07-14: 75 mL via INTRAVENOUS

## 2022-07-14 MED ORDER — ONDANSETRON 4 MG PO TBDP: 4.0000 mg | ORAL_TABLET | Freq: Three times a day (TID) | ORAL | 0 refills | Status: DC | PRN

## 2022-07-14 NOTE — ED Provider Notes (Signed)
[name] Provider Note   CSN: [id number] Arrival date & time: 07/14/22  [id number]     History {Add pertinent medical, surgical, social history, OB history to HPI:1} Chief Complaint  Patient presents with   Leg Swelling    Latoya Guzman is a 47 y.o. female.  She is here with a complaint of swelling and pain of both of her lower legs along with blotchy red spots that started earlier today.  She said this has happened a few times in the past and they think it is related to her lupus.  She said she did have a minor headache today and has some blurry vision but that is improved.  She said her legs are itching and burning.  She saw her primary care doctor's office today who recommended she come here to get an ultrasound.  The history is provided by the patient.  Leg Pain Location:  Leg Time since incident:  1 day Leg location:  L lower leg and R lower leg Pain details:    Quality:  Burning   Severity:  Moderate   Onset quality:  Gradual   Timing:  Constant   Progression:  Unchanged Chronicity:  Recurrent Relieved by:  None tried Exacerbated by: scratching. Ineffective treatments:  None tried Associated symptoms: swelling   Associated symptoms: no fever and no muscle weakness        Home Medications Prior to Admission medications   Medication Sig Start Date End Date Taking? Authorizing Provider  albuterol (VENTOLIN HFA) 108 (90 Base) MCG/ACT inhaler Inhale 2 puffs into the lungs every 4 (four) hours as needed for wheezing or shortness of breath. 05/21/21   Valentina Shaggy, MD  Cetirizine HCl 10 MG CAPS Take 1 capsule (10 mg total) by mouth daily. 12/24/20   Carollee Leitz, MD  famotidine (PEPCID) 20 MG tablet TAKE 1 TABLET(20 MG) BY MOUTH TWICE DAILY 07/07/22   Leeanne Rio, MD  LINZESS 290 MCG CAPS capsule Take 290 mcg by mouth daily as needed. 01/28/21   [provider]  loperamide (IMODIUM) 2 MG capsule Take 1  capsule (2 mg total) by mouth as needed for diarrhea or loose stools. 06/21/22   Leeanne Rio, MD  omeprazole (PRILOSEC) 40 MG capsule Take 1 capsule (40 mg total) by mouth in the morning and at bedtime. Patient taking differently: Take 40 mg by mouth in the morning and at bedtime. 05/21/21   Valentina Shaggy, MD  ondansetron (ZOFRAN-ODT) 4 MG disintegrating tablet Take 1 tablet (4 mg total) by mouth every 8 (eight) hours as needed for nausea. 06/21/22   Leeanne Rio, MD  sucralfate (CARAFATE) 1 g tablet Take 1 tablet (1 g total) by mouth 4 (four) times daily -  with meals and at bedtime. 07/07/22   Leeanne Rio, MD  tiZANidine (ZANAFLEX) 4 MG tablet Take 1 tablet (4 mg total) by mouth every 6 (six) hours as needed for muscle spasms. Patient taking differently: Take 2 mg by mouth every 6 (six) hours as needed for muscle spasms. 05/01/22   Hans Eden, NP      Allergies    Sumatriptan, Methocarbamol, Latex, and Morphine and related    Review of Systems   Review of Systems  Constitutional:  Negative for fever.  HENT:  Negative for sore throat.   Eyes:  Positive for visual disturbance.  Respiratory:  Negative for shortness of breath.   Cardiovascular:  Positive for leg  swelling. Negative for chest pain.  Gastrointestinal:  Negative for abdominal pain.  Genitourinary:  Negative for dysuria.  Skin:  Positive for rash.  Neurological:  Positive for headaches.    Physical Exam Updated Vital Signs BP 114/86 (BP Location: Left Arm)   Pulse 79   Temp 98.5 F (36.9 C) (Oral)   Resp 18   Ht 5\' 11"  (1.803 m)   Wt 112 kg   LMP 07/14/2022 (Approximate)   SpO2 96%   BMI 34.45 kg/m  Physical Exam Vitals and nursing note reviewed.  Constitutional:      General: She is not in acute distress.    Appearance: Normal appearance. She is well-developed.  HENT:     Head: Normocephalic and atraumatic.  Eyes:     Conjunctiva/sclera: Conjunctivae normal.  Cardiovascular:      Rate and Rhythm: Normal rate and regular rhythm.     Heart sounds: No murmur heard. Pulmonary:     Effort: Pulmonary effort is normal. No respiratory distress.     Breath sounds: Normal breath sounds.  Abdominal:     Palpations: Abdomen is soft.     Tenderness: There is no abdominal tenderness.  Musculoskeletal:        General: Tenderness present.     Cervical back: Neck supple.     Right lower leg: Edema present.     Left lower leg: Edema present.     Comments: She has some mild edema both lower extremities.  There is some erythematous blotchy rash on the skin that is not raised or indurated.  She is diffusely tender although no cords appreciated.  Distal pulses motor and sensation intact.  Skin:    General: Skin is warm and dry.     Capillary Refill: Capillary refill takes less than 2 seconds.  Neurological:     General: No focal deficit present.     Mental Status: She is alert.     Sensory: No sensory deficit.     Motor: No weakness.     Gait: Gait normal.     ED Results / Procedures / Treatments   Labs (all labs ordered are listed, but only abnormal results are displayed) Labs Reviewed - No data to display  EKG None  Radiology CT Abdomen Pelvis W Contrast  Result Date: 07/14/2022 CLINICAL DATA:  Epigastric abdominal pain. EXAM: CT ABDOMEN AND PELVIS WITH CONTRAST TECHNIQUE: Multidetector CT imaging of the abdomen and pelvis was performed using the standard protocol following bolus administration of intravenous contrast. RADIATION DOSE REDUCTION: This exam was performed according to the departmental dose-optimization program which includes automated exposure control, adjustment of the mA and/or kV according to patient size and/or use of iterative reconstruction technique. CONTRAST:  5mL OMNIPAQUE IOHEXOL 350 MG/ML SOLN COMPARISON:  Ultrasound June 28, 2021 and CT abdomen pelvis October 06, 2017. FINDINGS: Lower chest: No acute abnormality. Hepatobiliary: Stable hypodense  hepatic lesion measuring 12 mm in the right lobe of the liver on image 15/3 compatible with a benign finding. Gallbladder is unremarkable. No biliary ductal dilation. Pancreas: No pancreatic ductal dilation or evidence of acute inflammation. Spleen: No splenomegaly or focal splenic lesion. Adrenals/Urinary Tract: Bilateral adrenal glands appear normal. No hydronephrosis. Kidneys demonstrate symmetric enhancement. Urinary bladder is unremarkable for degree of distension. Stomach/Bowel: No radiopaque enteric contrast material was administered. Stomach is unremarkable for degree of distension. No pathologic dilation of small or large bowel. Normal appendix. No evidence of acute bowel inflammation. Moderate volume of formed stool in the colon. Vascular/Lymphatic:  Normal caliber abdominal aorta. Smooth IVC contours. No pathologically enlarged abdominal or pelvic lymph nodes. Reproductive: Enlarged uterus with lobular contour similar prior and commonly reflects leiomyomas. Tubal ligation clips. No suspicious adnexal mass. Other: Trace pelvic free fluid is within physiologic normal limits. Musculoskeletal: No acute osseous abnormality. IMPRESSION: 1. No acute abnormality in the abdomen or pelvis. 2. Moderate volume of formed stool in the colon. Correlate for constipation. 3. Enlarged uterus with lobular contour similar prior and commonly reflects leiomyomas. Electronically Signed   By: Dahlia Bailiff M.D.   On: 07/14/2022 14:10    Procedures Procedures  {Document cardiac monitor, telemetry assessment procedure when appropriate:1}  Medications Ordered in ED Medications - No data to display  ED Course/ Medical Decision Making/ A&P   {   Click here for ABCD2, HEART and other calculatorsREFRESH Note before signing :1}                          Medical Decision Making Amount and/or Complexity of Data Reviewed Labs: ordered.   This patient complains of ***; this involves an extensive number of  treatment Options and is a complaint that carries with it a high risk of complications and morbidity. The differential includes ***  I ordered, reviewed and interpreted labs, which included *** I ordered medication *** and reviewed PMP when indicated. I ordered imaging studies which included *** and I independently    visualized and interpreted imaging which showed *** Additional history obtained from *** Previous records obtained and reviewed *** I consulted *** and discussed lab and imaging findings and discussed disposition.  Cardiac monitoring reviewed, *** Social determinants considered, *** Critical Interventions: ***  After the interventions stated above, I reevaluated the patient and found *** Admission and further testing considered, ***   {Document critical care time when appropriate:1} {Document review of labs and clinical decision tools ie heart score, Chads2Vasc2 etc:1}  {Document your independent review of radiology images, and any outside records:1} {Document your discussion with family members, caretakers, and with consultants:1} {Document social determinants of health affecting pt's care:1} {Document your decision making why or why not admission, treatments were needed:1} Final Clinical Impression(s) / ED Diagnoses Final diagnoses:  None    Rx / DC Orders ED Discharge Orders     None

## 2022-07-14 NOTE — Progress Notes (Signed)
    SUBJECTIVE:   CHIEF COMPLAINT: LE edema HPI:   Latoya Guzman is a 47 y.o.  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   She reports she works in a [employer].  Earlier today she noticed leg tightness, some mild pain and pruritus.  She looked down and her legs look like "sausages."  This was left greater than right.  She has also noted shortness of breath when she went from squatting to standing earlier today and intermittent chest tightness.  She has a history of antiphospholipid positivity  but has never had a venous thromboembolic event.  She is not on anticoagulation.  No change in detergents.  No fevers.  She does report her legs feel warm and she has had "spots" on her legs before.  These occur intermittently about once or twice a year she thinks.  The patient additionally reports heavy menses.  She has a history of fibroids.  She has a bilateral tubal ligation.  For the last year periods became irregular. Recently had 2 months of spotting followed by a heavy menses.  Her last hemoglobin was 9.  She regularly takes iron.  Medical history is reviewed there is no family history of VTE or heart failure to her knowledge.  OBJECTIVE:   BP 127/71 (BP Location: Right Arm, Patient Position: Sitting, Cuff Size: Normal)   Pulse 89   Wt 247 lb (112 kg)   BMI 34.45 kg/m   Today's weight:  Last Weight  Most recent update: 07/14/2022  4:13 PM    Weight  112 kg (247 lb)            Review of prior weights: [employer]   07/14/22 1613  Weight: 247 lb (112 kg)     Cardiac: Regular rate and rhythm. Normal S1/S2. No murmurs, rubs, or gallops appreciated. Lungs: Clear bilaterally to ascultation.   Psych: Pleasant and appropriate  Bilateral lower extremities have 1+ pedal edema.  Left greater than right.  Several erythematous macules on bilateral lower extremities with evidence of  excoriation.  ASSESSMENT/PLAN:   Lower extremity edema Differential includes venous thromboembolism which is most concerning given her history of antiphospholipid positivity Other etiologies considered include venous stasis, less likely cellulitis could be reaction to a new detergent or soap or her socks.  Given my concern we discussed the need for venous ultrasound.  She will proceed to the emergency room for this. Reviewed strict ED precautions--she is proceed there now   Anemia  associated with heavy menstrual bleeding Discussed options referral to gynecology. Suspect cause is fibroids  She will follow up for Pap with PCP     Dorris Singh, [place]

## 2022-07-14 NOTE — ED Notes (Addendum)
Ultrasound at bedside Will obtain blood when they are finished

## 2022-07-14 NOTE — ED Notes (Signed)
Bilateral leg swelling and painful.  Redness noted to both with splotches

## 2022-07-14 NOTE — Telephone Encounter (Signed)
Patient calls nurse line regarding swelling in BLE. She states that this started this morning. She is also reporting splotches and burning sensation in legs.   She denies [facility] or chest pain at this time.   Scheduled for further evaluation this afternoon at 4:10.  Strict ED precautions discussed.   Talbot Grumbling, RN

## 2022-07-14 NOTE — Discharge Instructions (Signed)
You were seen in the emergency department for evaluation of swelling and rash on your legs.  You had blood work that looked fairly unremarkable and ultrasound of your legs that did not show any evidence of a blood clot.  Please follow-up with your primary care doctor and talk to them about a possible referral to rheumatology.  Return to the emergency department if any worsening or concerning symptoms

## 2022-07-14 NOTE — ED Triage Notes (Signed)
Pt states bilateral leg rash and red splotches and burning sensation that started this am around ankles  H/o same due to elevated liver enzymes

## 2022-07-14 NOTE — Patient Instructions (Signed)
It was wonderful to see you today.  Please bring ALL of your medications with you to every visit.   Today we talked about:   - I recommend going to the Emergency Room for your swelling--I am worried about a blood clot   I have referred you to Gynecology to further evaluate your concern. If you do not received a phone call about this appointment within 2 weeks, please call our office back at [phone]. Jazmin Hartsell coordinates our referrals and can assist you in this.    Please follow up in 3 months   Thank you for choosing [facility].   Please call [phone] with any questions about today's appointment.  Please be sure to schedule follow up at the front  desk before you leave today.   Dorris Singh, MD  Family Medicine

## 2022-07-15 LAB — C-REACTIVE PROTEIN: CRP: 0.7 mg/dL (ref <1.0)

## 2022-07-16 ENCOUNTER — Telehealth: Payer: Self-pay

## 2022-07-16 NOTE — Telephone Encounter (Signed)
Patient returns call to nurse line. Scheduled for Monday, 3/25 with Dr. Zigmund Daniel.   Talbot Grumbling, RN

## 2022-07-16 NOTE — Telephone Encounter (Signed)
Attempted to return call to patient. She did not answer, LVM asking patient to return call to office.   Talbot Grumbling, RN

## 2022-07-16 NOTE — Telephone Encounter (Signed)
Patient calls nurse line in regards to continued bilateral leg pain.   She reports for the last few days her legs have been red, swollen and painful. She reports "red blotchy spots like last time." She reports she has been working multiple jobs lately and feels this is contributing.   She denies any chest pains, SOB, headaches or blurry vision.   She reports she took 1/2 of a muscle relaxer that seemed to help. She is requesting recommendations for pain management.   Patient advised to rest and elevate her legs.   ED precautions discussed with patient.   Will forward to provider who saw patient.

## 2022-07-16 NOTE — Telephone Encounter (Signed)
I recommend being seen to discuss pain management options as we did not discus at our visit. This seems to be a recurrent, ongoing issue requiring further evaluation. Dorris Singh, MD  Family Medicine Teaching Service

## 2022-07-19 ENCOUNTER — Ambulatory Visit: Payer: Medicaid Other | Admitting: Student
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
## 2022-07-22 ENCOUNTER — Encounter: Payer: Self-pay | Admitting: Family Medicine

## 2022-07-26 ENCOUNTER — Encounter: Payer: Self-pay | Admitting: *Deleted
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
## 2022-07-27 ENCOUNTER — Encounter: Payer: Self-pay | Admitting: Family Medicine

## 2022-07-27 ENCOUNTER — Ambulatory Visit (INDEPENDENT_AMBULATORY_CARE_PROVIDER_SITE_OTHER): Payer: Medicaid Other | Admitting: Family Medicine

## 2022-07-27 VITALS — BP 114/69 | HR 67 | Ht 71.0 in | Wt 242.2 lb

## 2022-07-27 DIAGNOSIS — H539 Unspecified visual disturbance: Secondary | ICD-10-CM | POA: Diagnosis not present

## 2022-07-27 DIAGNOSIS — R600 Localized edema: Secondary | ICD-10-CM

## 2022-07-27 NOTE — Progress Notes (Unsigned)
    SUBJECTIVE:   CHIEF COMPLAINT / HPI:   Lower Extremity Edema Seen for this on 3/20- was sent to ED for DVT ultrasound which was negative.  History of antiphospholipid positivity, not on anticoagulation due to anemia secondary to heavy menses.  Legs get swollen during the day when she's on her feet, some improvement overnight.   Vision Changes R eye 1.5 weeks ago, felt like her eye was burning like something was in it Flushed it, no improvement Light sensitivity Got better Came back this past weekend, yesterday had some blurriness Reports her migraines are back- not on Nurtec any more, been off for several months  PERTINENT  PMH / PSH: ***  OBJECTIVE:   BP 114/69   Pulse 67   Ht 5\' 11"  (1.803 m)   Wt 242 lb 3.2 oz (109.9 kg)   LMP 07/14/2022 (Approximate)   SpO2 100%   BMI 33.78 kg/m   ***  ASSESSMENT/PLAN:   No problem-specific Assessment & Plan notes found for this encounter.     Alcus Dad, MD [place]

## 2022-07-27 NOTE — Patient Instructions (Addendum)
It was great to meet you!  Your leg swelling is most likely from the veins in your legs not working perfectly. We treat this with compression socks. You can buy a pair online or at your pharmacy or [place]. Try to wear these as often as possible, especially when you're on your feet for work. When you're not standing, try elevating your legs.  For your vision issues: I placed a referral to the eye specialist for a comprehensive eye evaluation. This could also be related to your migraines and I recommend you contact your neurologist to discuss getting back on the Nurtec. [facility] [phone]  Follow up with your PCP, Dr Madison Hickman, at your earliest convenience for an annual physical (including pap smear).  Take care, Dr. Rock Nephew

## 2022-07-29 DIAGNOSIS — H539 Unspecified visual disturbance: Secondary | ICD-10-CM | POA: Insufficient documentation

## 2022-07-29 NOTE — Assessment & Plan Note (Addendum)
Minimal appreciable edema on exam today. On chart review she's had this intermittently since at least 2021. Suspect venous insufficiency. Reassuringly she had negative DVT ultrasound recently. Advised compression stockings and elevation when possible.

## 2022-07-29 NOTE — Assessment & Plan Note (Signed)
Recently developed episodic right eye pain followed by light sensitivity and blurriness. Visual acuity intact today. No abnormalities appreciated on limited eye exam. I suspect this is related to her migraines. Advised she see her neurologist to discuss resuming Nurtec. Will refer to ophtho since she's been told she has elevated ocular pressures in the past.

## 2022-08-02 ENCOUNTER — Encounter: Payer: Medicaid Other | Admitting: Family Medicine
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
## 2022-08-12 ENCOUNTER — Encounter: Payer: Self-pay | Admitting: Internal Medicine

## 2022-08-12 ENCOUNTER — Telehealth: Payer: Self-pay | Admitting: Student

## 2022-08-12 NOTE — Telephone Encounter (Signed)
Patient dropped off form at front desk for [facility].  Verified that patient section of form has been completed.  Last DOS/WCC with PCP was 07/27/22.  Placed form in green team folder to be completed by clinical staff.  Vilinda Blanks

## 2022-08-12 NOTE — Telephone Encounter (Signed)
Reviewed form and placed in provider's box for completion. ° °.Melissia Lahman R Jaymason Ledesma, CMA ° °

## 2022-08-13 ENCOUNTER — Ambulatory Visit: Payer: Medicaid Other

## 2022-08-24 ENCOUNTER — Encounter: Payer: Self-pay | Admitting: Gastroenterology

## 2022-08-24 NOTE — Telephone Encounter (Signed)
Called pt. She is needing FMLA for flare ups of legs turning red and swelling. Joint in back (SI Joint) swelling. Both of those make it difficult to get out of bed or walk some days. Would like intermittent FMLA. So she can take days off when needs to without losing her job.  Please call pt if you have any questions. I have put the form in Dr. Delaney Meigs box for completion. 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  Sunday Spillers, CMA

## 2022-08-27 ENCOUNTER — Ambulatory Visit (INDEPENDENT_AMBULATORY_CARE_PROVIDER_SITE_OTHER): Payer: Medicaid Other | Admitting: Family Medicine

## 2022-08-27 DIAGNOSIS — R600 Localized edema: Secondary | ICD-10-CM | POA: Diagnosis present

## 2022-08-27 DIAGNOSIS — Z569 Unspecified problems related to employment: Secondary | ICD-10-CM

## 2022-08-27 NOTE — Progress Notes (Unsigned)
Patient here to discuss paperwork for FMLA and refused for vitals to be taken.    Latoya Guzman, CMA

## 2022-08-27 NOTE — Progress Notes (Unsigned)
    SUBJECTIVE:   CHIEF COMPLAINT / HPI:   Discuss FMLA Patient requesting FMLA due to leg issues. Requesting documentation that she has intermittent flares of leg swelling and pain that cause her to miss work. States she has difficulty walking when this happens. Tried compression stockings since her last visit but reports these made it worse. Has been evaluated for this in the past but no clear etiology identified.  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 General: NAD, able to participate in exam Respiratory: No respiratory distress Skin: warm and dry, no rashes noted Ext: no significant lower extremity edema Neuro: grossly intact, normal gait  ASSESSMENT/PLAN:   Bilateral leg edema Patient describes episodic bilateral lower extremity edema with associated pain and redness. Symptoms result in difficulty walking.  Not flaring currently, so exam is unremarkable.  No clear etiology based on prior evaluations.  FMLA paperwork completed (see media tab for scanned copy). Advised she schedule appt during next flare so we can pursue additional workup/referral if appropriate.     Maury Dus, MD [facility]

## 2022-08-29 NOTE — Assessment & Plan Note (Addendum)
Patient describes episodic bilateral lower extremity edema with associated pain and redness. Symptoms result in difficulty walking.  Not flaring currently, so exam is unremarkable.  No clear etiology based on prior evaluations.  FMLA paperwork completed (see media tab for scanned copy). Advised she schedule appt during next flare so we can pursue additional workup/referral if appropriate.

## 2022-09-02 ENCOUNTER — Ambulatory Visit: Payer: Medicaid Other | Admitting: Family Medicine

## 2022-09-02 NOTE — Telephone Encounter (Signed)
FMLA paperwork completed and placed in RN triage bin in front office.   Had planned to give to patient during her appointment today but she cancelled.   Maury Dus, MD PGY-3, [facility]

## 2022-09-03 NOTE — Telephone Encounter (Signed)
Forms placed up front for pick up.   Copy made for batch scanning.   Attempted to contact patient, however no answer.  

## 2022-09-13 NOTE — Progress Notes (Unsigned)
  SUBJECTIVE:   CHIEF COMPLAINT / HPI:   Bilateral leg edema: usually happens about 3 days a week, notes she is on her feet all day long. She feels that it is worse when she is standing. She has worn the compression stockings (Dr. Margart Sickles from [place]) and has felt the skin has compromised the stockings. Notes in total, she has dealt with this for about since August 2012.   Requesting pap today.  PERTINENT  PMH / PSH: Antiphospholipid antibody positive, iron deficiency anemia, depression  Patient Care Team: Shelby Mattocks, DO as PCP - General (Family Medicine) Quintella Reichert, MD as PCP - Cardiology (Cardiology) Myra Rude, MD as Referring Physician (Family Medicine) Ralene Cork, DO as Consulting Physician (Sports Medicine) Drema Dallas, DO as Consulting Physician (Neurology) Heidi Dach, RN as Case Manager OBJECTIVE:  BP 127/69   Pulse 86   Ht 5\' 11"  (1.803 m)   Wt 243 lb 12.8 oz (110.6 kg)   LMP 08/14/2022   SpO2 100%   BMI 34.00 kg/m  Physical Exam   ASSESSMENT/PLAN:  Bilateral leg edema -     TSH Rfx on Abnormal to Free T4 -     Ambulatory referral to Vascular Surgery  Routine cervical smear -     Cytology - PAP  Vaginal discharge -     POCT Wet Prep [facility]) -     metroNIDAZOLE; Take 1 tablet (500 mg total) by mouth 2 (two) times daily for 7 days.  Dispense: 14 tablet; Refill: 0   Return if symptoms worsen or fail to improve. Shelby Mattocks, DO 09/14/2022, 3:24 PM PGY-2, [facility] {    This will disappear when note is signed, click to select method of visit    :1}

## 2022-09-14 ENCOUNTER — Encounter: Payer: Self-pay | Admitting: Student

## 2022-09-14 ENCOUNTER — Ambulatory Visit (INDEPENDENT_AMBULATORY_CARE_PROVIDER_SITE_OTHER): Payer: Medicaid Other | Admitting: Student

## 2022-09-14 ENCOUNTER — Other Ambulatory Visit (HOSPITAL_COMMUNITY)
Admission: RE | Admit: 2022-09-14 | Discharge: 2022-09-14 | Disposition: A | Discharge status: Home / Self Care | Payer: Medicaid Other | Source: Ambulatory Visit | Attending: Family Medicine | Admitting: Family Medicine

## 2022-09-14 VITALS — BP 127/69 | HR 86 | Ht 71.0 in | Wt 243.8 lb

## 2022-09-14 DIAGNOSIS — R600 Localized edema: Secondary | ICD-10-CM

## 2022-09-14 DIAGNOSIS — A5901 Trichomonal vulvovaginitis: Secondary | ICD-10-CM | POA: Insufficient documentation

## 2022-09-14 DIAGNOSIS — N898 Other specified noninflammatory disorders of vagina: Secondary | ICD-10-CM

## 2022-09-14 DIAGNOSIS — Z124 Encounter for screening for malignant neoplasm of cervix: Secondary | ICD-10-CM

## 2022-09-14 LAB — POCT WET PREP (WET MOUNT)
Clue Cells Wet Prep Whiff POC: NEGATIVE
WBC, Wet Prep HPF POC: >20

## 2022-09-14 MED ORDER — METRONIDAZOLE 500 MG PO TABS: 500.0000 mg | ORAL_TABLET | Freq: Two times a day (BID) | ORAL | 0 refills | Status: AC

## 2022-09-14 NOTE — Patient Instructions (Signed)
It was great to see you today! Thank you for choosing [facility] for your primary care.  Today we addressed: You have trichomonas.  I have sent in a medication for you. Checking your thyroid for the bilateral lower extremity edema.  I would also like you to see vascular surgery because you develop that pain when you are standing and walking.  If you haven't already, sign up for My Chart to have easy access to your labs results, and communication with your primary care physician.  We are checking some labs today. If they are abnormal, I will call you. If they are normal, I will send you a MyChart message (if it is active) or a letter in the mail. If you do not hear about your labs in the next 2 weeks, please call the office.  You should return to our clinic Return if symptoms worsen or fail to improve. Please arrive 15 minutes before your appointment to ensure smooth check in process.  We appreciate your efforts in making this happen.  Thank you for allowing me to participate in your care, Shelby Mattocks, DO 09/14/2022, 3:24 PM PGY-2, [facility]

## 2022-09-15 LAB — TSH RFX ON ABNORMAL TO FREE T4: TSH: 1.2 u[IU]/mL (ref 0.450–4.500)

## 2022-09-15 NOTE — Assessment & Plan Note (Signed)
Positive on wet mount. Does not have contact information of last partner. Will treat accordingly.
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
## 2022-09-15 NOTE — Assessment & Plan Note (Signed)
Edema not present today although pictures reveal edema. I've advised her to send me pictures of this when it flares. Recommended compression stockings daily first thing in the morning. This is most consistent with venous insufficiency. Discussed weight as possible attributing factor. Reviewed prior notes and labwork, no reason to believe cardiac/nephrologic/hepatic factors are involved. Imaging has also appeared unremarkable. I will check thyroid. With her history of pain, it's reasonable to explore vascular causes so as to not miss PAD.

## 2022-09-16 ENCOUNTER — Ambulatory Visit: Payer: Medicaid Other | Attending: Physician Assistant | Admitting: Physician Assistant

## 2022-09-16 DIAGNOSIS — R079 Chest pain, unspecified: Secondary | ICD-10-CM

## 2022-09-16 DIAGNOSIS — R0609 Other forms of dyspnea: Secondary | ICD-10-CM

## 2022-09-17 ENCOUNTER — Encounter: Payer: Self-pay | Admitting: Physician Assistant

## 2022-09-21 LAB — CYTOLOGY - PAP
Chlamydia: NEGATIVE
Comment: NEGATIVE
Comment: NEGATIVE
Comment: NEGATIVE
Comment: NORMAL
Diagnosis: NEGATIVE
High risk HPV: NEGATIVE
Neisseria Gonorrhea: NEGATIVE
Trichomonas: POSITIVE — AB

## 2022-11-04 ENCOUNTER — Emergency Department (HOSPITAL_COMMUNITY)
Admission: EM | Admit: 2022-11-04 | Discharge: 2022-11-04 | Disposition: A | Discharge status: Home / Self Care | Payer: Medicaid Other | Attending: Emergency Medicine | Admitting: Emergency Medicine

## 2022-11-04 ENCOUNTER — Other Ambulatory Visit: Payer: Self-pay

## 2022-11-04 ENCOUNTER — Encounter (HOSPITAL_COMMUNITY): Payer: Self-pay | Admitting: Emergency Medicine

## 2022-11-04 ENCOUNTER — Telehealth: Payer: Self-pay | Admitting: Medical Oncology

## 2022-11-04 DIAGNOSIS — Z91040 Latex allergy status: Secondary | ICD-10-CM | POA: Diagnosis not present

## 2022-11-04 DIAGNOSIS — R519 Headache, unspecified: Secondary | ICD-10-CM | POA: Diagnosis not present

## 2022-11-04 DIAGNOSIS — L740 Miliaria rubra: Secondary | ICD-10-CM | POA: Insufficient documentation

## 2022-11-04 DIAGNOSIS — H538 Other visual disturbances: Secondary | ICD-10-CM | POA: Insufficient documentation

## 2022-11-04 LAB — CBC
HCT: 32.5 % — ABNORMAL LOW (ref 36.0–46.0)
Hemoglobin: 10.3 g/dL — ABNORMAL LOW (ref 12.0–15.0)
MCH: 21.3 pg — ABNORMAL LOW (ref 26.0–34.0)
MCHC: 31.7 g/dL (ref 30.0–36.0)
MCV: 67.3 fL — ABNORMAL LOW (ref 80.0–100.0)
Platelets: 294 10*3/uL (ref 150–400)
RBC: 4.83 MIL/uL (ref 3.87–5.11)
RDW: 17.2 % — ABNORMAL HIGH (ref 11.5–15.5)
WBC: 6.9 10*3/uL (ref 4.0–10.5)
nRBC: 0 % (ref 0.0–0.2)

## 2022-11-04 LAB — BASIC METABOLIC PANEL
Anion gap: 8 (ref 5–15)
BUN: 13 mg/dL (ref 6–20)
CO2: 22 mmol/L (ref 22–32)
Calcium: 9.1 mg/dL (ref 8.9–10.3)
Chloride: 106 mmol/L (ref 98–111)
Creatinine, Ser: 0.94 mg/dL (ref 0.44–1.00)
GFR, Estimated: >60 mL/min (ref >60)
Glucose, Bld: 87 mg/dL (ref 70–99)
Potassium: 4.3 mmol/L (ref 3.5–5.1)
Sodium: 136 mmol/L (ref 135–145)

## 2022-11-04 LAB — HCG, SERUM, QUALITATIVE: Preg, Serum: NEGATIVE

## 2022-11-04 MED ORDER — METOCLOPRAMIDE HCL 5 MG/ML IJ SOLN
10.0000 mg | Freq: Once | INTRAMUSCULAR | Status: AC
  Administered 2022-11-04: 10 mg via INTRAVENOUS
  Filled 2022-11-04: qty 2

## 2022-11-04 MED ORDER — SODIUM CHLORIDE 0.9 % IV BOLUS
1000.0000 mL | Freq: Once | INTRAVENOUS | Status: AC
  Administered 2022-11-04: 1000 mL via INTRAVENOUS

## 2022-11-04 MED ORDER — ACETAMINOPHEN 500 MG PO TABS
1000.0000 mg | ORAL_TABLET | Freq: Once | ORAL | Status: AC
  Administered 2022-11-04: 1000 mg via ORAL
  Filled 2022-11-04: qty 2

## 2022-11-04 NOTE — ED Provider Notes (Signed)
[facility] EMERGENCY DEPARTMENT AT [facility] Provider Note   CSN: [id number] Arrival date & time: 11/04/22  1327     History  Chief Complaint  Patient presents with   Blurred Vision   Back Pain    Latoya Guzman is a 47 y.o. female.  Patient is a 47 year old female with past medical history of chronic back pain and GERD presenting to the emergency department with blurred vision in the lower extremities rash.  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  She states that it occurred over an hour and was coming and going over that time.  She states that it affected both eyes.  She states that she had 1 episode of a sharp pain on the right side of her eye that lasted for few seconds.  She states that she has had a mild headache.  She states that she has felt lightheaded and generally weak and did sit down and ate a little bit of lunch with some mild improvement.  She states that she has also been noticing an intermittent rash of her bilateral lower extremities that tends to occur while at work.  She states that it feels prickly discomfort to touch and felt warm.  She denies any fevers.  The history is provided by the patient.  Back Pain      Home Medications Prior to Admission medications   Medication Sig Start Date End Date Taking? Authorizing Provider  albuterol (VENTOLIN HFA) 108 (90 Base) MCG/ACT inhaler Inhale 2 puffs into the lungs every 4 (four) hours as needed for wheezing or shortness of breath. 05/21/21  Yes Alfonse Spruce, MD  LINZESS 290 MCG CAPS capsule Take 290 mcg by mouth daily as needed (For IBS). 01/28/21  Yes [provider]  Cetirizine HCl 10 MG CAPS Take 1 capsule (10 mg total) by mouth daily. Patient not taking: Reported on 11/04/2022 12/24/20   Dana Allan, MD  famotidine (PEPCID) 20 MG tablet TAKE 1 TABLET(20 MG) BY MOUTH  TWICE DAILY Patient not taking: Reported on 11/04/2022 07/07/22   Latrelle Dodrill, MD  loperamide (IMODIUM) 2 MG capsule Take 1 capsule (2 mg total) by mouth as needed for diarrhea or loose stools. Patient not taking: Reported on 11/04/2022 06/21/22   Latrelle Dodrill, MD  omeprazole (PRILOSEC) 40 MG capsule Take 1 capsule (40 mg total) by mouth in the morning and at bedtime. Patient not taking: Reported on 11/04/2022 05/21/21   Alfonse Spruce, MD  ondansetron (ZOFRAN-ODT) 4 MG disintegrating tablet Take 1 tablet (4 mg total) by mouth every 8 (eight) hours as needed for nausea. Patient not taking: Reported on 11/04/2022 07/14/22   Terrilee Files, MD  sucralfate (CARAFATE) 1 g tablet Take 1 tablet (1 g total) by mouth 4 (four) times daily -  with meals and at bedtime. Patient not taking: Reported on 11/04/2022 07/07/22   Latrelle Dodrill, MD  tiZANidine (ZANAFLEX) 4 MG tablet Take 1 tablet (4 mg total) by mouth every 6 (six) hours as needed for muscle spasms. Patient not taking: Reported on 11/04/2022 05/01/22   Valinda Hoar, NP      Allergies    Sumatriptan, Methocarbamol, Latex, and Morphine and codeine    Review of Systems   Review of Systems  Musculoskeletal:  Positive for back pain.    Physical Exam Updated Vital Signs BP 129/88  Pulse 63   Temp 97.7 F (36.5 C) (Oral)   Resp 16   Ht 5\' 11"  (1.803 m)   Wt 110 kg   LMP 11/04/2022   SpO2 100%   BMI 33.82 kg/m  Physical Exam Vitals and nursing note reviewed.  Constitutional:      General: She is not in acute distress.    Appearance: Normal appearance.  HENT:     Head: Normocephalic and atraumatic.     Nose: Nose normal.     Mouth/Throat:     Mouth: Mucous membranes are moist.     Pharynx: Oropharynx is clear.  Eyes:     Extraocular Movements: Extraocular movements intact.     Conjunctiva/sclera: Conjunctivae normal.     Pupils: Pupils are equal, round, and reactive to light.  Cardiovascular:      Rate and Rhythm: Normal rate and regular rhythm.     Pulses: Normal pulses.     Heart sounds: Normal heart sounds.  Pulmonary:     Effort: Pulmonary effort is normal.     Breath sounds: Normal breath sounds.  Abdominal:     General: Abdomen is flat.     Palpations: Abdomen is soft.     Tenderness: There is no abdominal tenderness.  Musculoskeletal:        General: Normal range of motion.     Cervical back: Normal range of motion.     Right lower leg: No edema.     Left lower leg: No edema.  Skin:    General: Skin is warm and dry.     Findings: Rash (Macular red patchy rash to bilateral shins around ankles, no significant warmth to touch) present.  Neurological:     General: No focal deficit present.     Mental Status: She is alert and oriented to person, place, and time.     Cranial Nerves: No cranial nerve deficit.     Sensory: No sensory deficit.     Motor: No weakness.  Psychiatric:        Mood and Affect: Mood normal.        Behavior: Behavior normal.     ED Results / Procedures / Treatments   Labs (all labs ordered are listed, but only abnormal results are displayed) Labs Reviewed  CBC - Abnormal; Notable for the following components:      Result Value   Hemoglobin 10.3 (*)    HCT 32.5 (*)    MCV 67.3 (*)    MCH 21.3 (*)    RDW 17.2 (*)    All other components within normal limits  BASIC METABOLIC PANEL  HCG, SERUM, QUALITATIVE    EKG None  Radiology No results found.  Procedures Procedures    Medications Ordered in ED Medications  metoCLOPramide (REGLAN) injection 10 mg (10 mg Intravenous Given 11/04/22 1612)  acetaminophen (TYLENOL) tablet 1,000 mg (1,000 mg Oral Given 11/04/22 1617)  sodium chloride 0.9 % bolus 1,000 mL (0 mLs Intravenous Stopped 11/04/22 1845)    ED Course/ Medical Decision Making/ A&P Clinical Course as of 11/04/22 2325  Thu Nov 04, 2022  1744 Patient reports improvement of her headache, no recurrence of blurred vision. Ocular  ultrasound normal. She is stable for discharge with outpatient follow up. [VK]    Clinical Course User Index [VK] Rexford Maus, DO  Medical Decision Making This patient presents to the ED with chief complaint(s) of blurred vision, rash with pertinent past medical history of chronic back pain, GERD which further complicates the presenting complaint. The complaint involves an extensive differential diagnosis and also carries with it a high risk of complications and morbidity.    The differential diagnosis includes dehydration, heat illness, heat rash, no evidence of cellulitis, electrolyte abnormality, patient did not have lasting onset severe headache without focal neurologic deficits making ICH or mass effect unlikely, with blurred vision coming and going making a IIH unlikely, pregnancy, not hypertensive making preeclampsia unlikely  Additional history obtained: Additional history obtained from N/A Records reviewed Primary Care Documents  ED Course and Reassessment: On patient's arrival to the emergency department she is hemodynamically stable in no acute distress.  She reports normal vision at this time though does have a mild headache.  Patient and labs performed by triage that showed anemia at her baseline and otherwise no signs of severe dehydration.  She will additionally a pregnancy test performed.  She will be given fluids and headache cocktail.  Independent labs interpretation:  The following labs were independently interpreted: within normal range  Independent visualization of imaging: - N/A  Consultation: - Consulted or discussed management/test interpretation w/ external professional: N/A  Consideration for admission or further workup: Patient has no emergent conditions requiring admission or further work-up at this time and is stable for discharge home with primary care follow-up  Social Determinants of health: N/A    Amount and/or  Complexity of Data Reviewed Labs: ordered.  Risk OTC drugs. Prescription drug management.          Final Clinical Impression(s) / ED Diagnoses Final diagnoses:  Blurred vision  Acute nonintractable headache, unspecified headache type  Heat rash    Rx / DC Orders ED Discharge Orders     None         Rexford Maus, DO 11/04/22 2325

## 2022-11-04 NOTE — ED Triage Notes (Signed)
Pt endorses intermittent blurred vision since being at work at 4 am and with tension headache. Also noticed some swelling and redness to bilateral lower legs. Pt works in [employer] and unsure if any exposure to legs. Endorses chronic back pain.

## 2022-11-04 NOTE — Telephone Encounter (Signed)
FYI-" I am  going to the ED because my legs are inflammed".  08/11/2021-last visit with [facility] .   10/13/21  appt No Show-lost to f/u

## 2022-11-04 NOTE — ED Provider Notes (Incomplete)
[place] EMERGENCY DEPARTMENT AT [facility] Provider Note   CSN: [id number] Arrival date & time: 11/04/22  1327     History {Add pertinent medical, surgical, social history, OB history to HPI:1} Chief Complaint  Patient presents with  . Blurred Vision  . Back Pain    Latoya Guzman is a 47 y.o. female.  Patient is a 47 year old female with past medical history of chronic back pain and GERD presenting to the emergency department with blurred vision in the lower extremities rash.  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  She states that it occurred over an hour and was coming and going over that time.  She states that it affected both eyes.  She states that she had 1 episode of a sharp pain on the right side of her eye that lasted for few seconds.  She states that she has had a mild headache.  She states that she has felt lightheaded and generally weak and did sit down and ate a little bit of lunch with some mild improvement.  She states that she has also been noticing an intermittent rash of her bilateral lower extremities that tends to occur while at work.  She states that it feels prickly discomfort to touch and felt warm.  She denies any fevers.  The history is provided by the patient.  Back Pain      Home Medications Prior to Admission medications   Medication Sig Start Date End Date Taking? Authorizing Provider  albuterol (VENTOLIN HFA) 108 (90 Base) MCG/ACT inhaler Inhale 2 puffs into the lungs every 4 (four) hours as needed for wheezing or shortness of breath. 05/21/21   Alfonse Spruce, MD  Cetirizine HCl 10 MG CAPS Take 1 capsule (10 mg total) by mouth daily. 12/24/20   Dana Allan, MD  famotidine (PEPCID) 20 MG tablet TAKE 1 TABLET(20 MG) BY MOUTH TWICE DAILY 07/07/22   Latrelle Dodrill, MD  LINZESS 290 MCG CAPS capsule Take 290 mcg by  mouth daily as needed. 01/28/21   [provider]  loperamide (IMODIUM) 2 MG capsule Take 1 capsule (2 mg total) by mouth as needed for diarrhea or loose stools. 06/21/22   Latrelle Dodrill, MD  omeprazole (PRILOSEC) 40 MG capsule Take 1 capsule (40 mg total) by mouth in the morning and at bedtime. Patient taking differently: Take 40 mg by mouth in the morning and at bedtime. 05/21/21   Alfonse Spruce, MD  ondansetron (ZOFRAN-ODT) 4 MG disintegrating tablet Take 1 tablet (4 mg total) by mouth every 8 (eight) hours as needed for nausea. 07/14/22   Terrilee Files, MD  sucralfate (CARAFATE) 1 g tablet Take 1 tablet (1 g total) by mouth 4 (four) times daily -  with meals and at bedtime. 07/07/22   Latrelle Dodrill, MD  tiZANidine (ZANAFLEX) 4 MG tablet Take 1 tablet (4 mg total) by mouth every 6 (six) hours as needed for muscle spasms. Patient taking differently: Take 2 mg by mouth every 6 (six) hours as needed for muscle spasms. 05/01/22   Valinda Hoar, NP      Allergies    Sumatriptan, Methocarbamol, Latex, and Morphine and codeine    Review of Systems   Review of Systems  Musculoskeletal:  Positive for back pain.    Physical Exam Updated Vital Signs BP 112/71 (BP Location: Left Arm)  Pulse 74   Temp 97.7 F (36.5 C) (Oral)   Resp 18   Ht 5\' 11"  (1.803 m)   Wt 110 kg   LMP 11/04/2022   SpO2 100%   BMI 33.82 kg/m  Physical Exam Vitals and nursing note reviewed.  Constitutional:      General: She is not in acute distress.    Appearance: Normal appearance.  HENT:     Head: Normocephalic and atraumatic.     Nose: Nose normal.     Mouth/Throat:     Mouth: Mucous membranes are moist.     Pharynx: Oropharynx is clear.  Eyes:     Extraocular Movements: Extraocular movements intact.     Conjunctiva/sclera: Conjunctivae normal.     Pupils: Pupils are equal, round, and reactive to light.  Cardiovascular:     Rate and Rhythm: Normal rate and regular rhythm.      Pulses: Normal pulses.     Heart sounds: Normal heart sounds.  Pulmonary:     Effort: Pulmonary effort is normal.     Breath sounds: Normal breath sounds.  Abdominal:     General: Abdomen is flat.     Palpations: Abdomen is soft.     Tenderness: There is no abdominal tenderness.  Musculoskeletal:        General: Normal range of motion.     Cervical back: Normal range of motion.     Right lower leg: No edema.     Left lower leg: No edema.  Skin:    General: Skin is warm and dry.     Findings: Rash (Macular red patchy rash to bilateral shins around ankles, no significant warmth to touch) present.  Neurological:     General: No focal deficit present.     Mental Status: She is alert and oriented to person, place, and time.     Cranial Nerves: No cranial nerve deficit.     Sensory: No sensory deficit.     Motor: No weakness.  Psychiatric:        Mood and Affect: Mood normal.        Behavior: Behavior normal.     ED Results / Procedures / Treatments   Labs (all labs ordered are listed, but only abnormal results are displayed) Labs Reviewed  CBC - Abnormal; Notable for the following components:      Result Value   Hemoglobin 10.3 (*)    HCT 32.5 (*)    MCV 67.3 (*)    MCH 21.3 (*)    RDW 17.2 (*)    All other components within normal limits  BASIC METABOLIC PANEL  HCG, SERUM, QUALITATIVE    EKG None  Radiology No results found.  Procedures Ultrasound ED Ocular  Date/Time: 11/04/2022 5:43 PM  Performed by: Rexford Maus, DO Authorized by: Rexford Maus, DO   PROCEDURE DETAILS:    Indications: evaluation for increased intracranial pressure and visual change     Assessed:  Left eye and right eye   Left eye axial view: obtained     Left eye saggital view: obtained     Right eye axial view: obtained     Right eye sagittal view: obtained     Images: archived     Limitations:  None RIGHT EYE FINDINGS:     no right eye increased optic nerve  sheath diameter   Optic nerve sheath diameter (mm):  37 LEFT EYE FINDINGS:     no left eye increased optic nerve sheath  diameter   Optic nerve sheath diameter (mm):  41   {Document cardiac monitor, telemetry assessment procedure when appropriate:1}  Medications Ordered in ED Medications  metoCLOPramide (REGLAN) injection 10 mg (has no administration in time range)  acetaminophen (TYLENOL) tablet 1,000 mg (has no administration in time range)  sodium chloride 0.9 % bolus 1,000 mL (has no administration in time range)    ED Course/ Medical Decision Making/ A&P   {   Click here for ABCD2, HEART and other calculatorsREFRESH Note before signing :1}                          Medical Decision Making This patient presents to the ED with chief complaint(s) of blurred vision, rash with pertinent past medical history of chronic back pain, GERD which further complicates the presenting complaint. The complaint involves an extensive differential diagnosis and also carries with it a high risk of complications and morbidity.    The differential diagnosis includes dehydration, heat illness, heat rash, no evidence of cellulitis, electrolyte abnormality, patient did not have lasting onset severe headache without focal neurologic deficits making ICH or mass effect unlikely, with blurred vision coming and going making a IIH unlikely, pregnancy, not hypertensive making preeclampsia unlikely  Additional history obtained: Additional history obtained from N/A Records reviewed Primary Care Documents  ED Course and Reassessment: On patient's arrival to the emergency department she is hemodynamically stable in no acute distress.  She reports normal vision at this time though does have a mild headache.  Patient and labs performed by triage that showed anemia at her baseline and otherwise no signs of severe dehydration.  She will additionally a pregnancy test performed.  She will be given fluids and headache  cocktail.  Independent labs interpretation:  The following labs were independently interpreted: ***  Independent visualization of imaging: - I independently visualized the following imaging with scope of interpretation limited to determining acute life threatening conditions related to emergency care: ***, which revealed ***  Consultation: - Consulted or discussed management/test interpretation w/ external professional: ***  Consideration for admission or further workup: *** Social Determinants of health: ***    Amount and/or Complexity of Data Reviewed Labs: ordered.  Risk OTC drugs. Prescription drug management.   ***  {Document critical care time when appropriate:1} {Document review of labs and clinical decision tools ie heart score, Chads2Vasc2 etc:1}  {Document your independent review of radiology images, and any outside records:1} {Document your discussion with family members, caretakers, and with consultants:1} {Document social determinants of health affecting pt's care:1} {Document your decision making why or why not admission, treatments were needed:1} Final Clinical Impression(s) / ED Diagnoses Final diagnoses:  None    Rx / DC Orders ED Discharge Orders     None

## 2022-11-04 NOTE — Discharge Instructions (Signed)
You were seen in the emergency department for your headache and your blurred vision.  Your workup showed no signs of increased pressure on your brain or signs of stroke.  You likely were overheated but had no signs of severe dehydration.  You should make sure that you are drinking plenty of water and taking breaks during the workday to prevent overheating.  You should avoid wearing tight clothing to prevent a heat rash as well.  You can follow-up with your primary doctor to have your symptoms rechecked.  You should return to the emergency department if you have significantly worsening headaches, you lose vision in your eye, or if you have any other new or concerning symptoms.

## 2022-11-05 ENCOUNTER — Other Ambulatory Visit: Payer: Self-pay | Admitting: *Deleted

## 2022-11-05 DIAGNOSIS — R600 Localized edema: Secondary | ICD-10-CM
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
## 2022-11-15 ENCOUNTER — Ambulatory Visit (HOSPITAL_COMMUNITY): Payer: Medicaid Other | Attending: Surgery

## 2022-11-15 ENCOUNTER — Encounter: Payer: Medicaid Other | Admitting: Vascular Surgery

## 2022-11-24 ENCOUNTER — Ambulatory Visit: Payer: Medicaid Other | Admitting: Gastroenterology
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
## 2023-01-25 ENCOUNTER — Ambulatory Visit (HOSPITAL_COMMUNITY)
Admission: RE | Admit: 2023-01-25 | Discharge: 2023-01-25 | Disposition: A | Discharge status: Home / Self Care | Payer: Medicaid Other | Source: Ambulatory Visit | Attending: Vascular Surgery | Admitting: Vascular Surgery

## 2023-01-25 ENCOUNTER — Ambulatory Visit (INDEPENDENT_AMBULATORY_CARE_PROVIDER_SITE_OTHER): Payer: Medicaid Other | Admitting: Physician Assistant

## 2023-01-25 VITALS — BP 103/71 | HR 84 | Temp 97.6°F | Wt 231.0 lb

## 2023-01-25 DIAGNOSIS — M7989 Other specified soft tissue disorders: Secondary | ICD-10-CM

## 2023-01-25 DIAGNOSIS — R600 Localized edema: Secondary | ICD-10-CM
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
## 2023-03-17 ENCOUNTER — Ambulatory Visit: Payer: Medicaid Other | Admitting: Gastroenterology
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
## 2023-04-06 ENCOUNTER — Telehealth: Payer: Self-pay | Admitting: *Deleted

## 2023-04-06 NOTE — Patient Outreach (Signed)
  Care Management   Note  04/06/2023 Name: Latoya Guzman MRN: [id number] DOB: [birth year]  [name] is enrolled in a Managed Medicaid plan: Yes. Outreach attempt today was unsuccessful.   The care management team will reach out to the patient again over the next 7 days.   Estanislado Emms RN, BSN [place]  [facility] RN Care Coordinator [phone]

## 2023-04-07 ENCOUNTER — Telehealth: Payer: Self-pay | Admitting: *Deleted

## 2023-04-07 NOTE — Patient Outreach (Signed)
  Medicaid Managed Care   Unsuccessful Outreach Note  04/07/2023 Name: Latoya Guzman MRN: [id number] DOB: 1976/03/22  Referred by: Shelby Mattocks, DO Reason for referral : High Risk Managed Medicaid (Unsuccessful RNCM telephone outreach)   A second unsuccessful telephone outreach was attempted today. The patient was referred to the case management team for assistance with care management and care coordination.   Follow Up Plan: The care management team will reach out to the patient again over the next 7 days.   Estanislado Emms RN, BSN [place]  Value-Based [facility] RN Care Coordinator [phone]

## 2023-04-08 ENCOUNTER — Telehealth: Payer: Self-pay | Admitting: *Deleted

## 2023-04-08 NOTE — Patient Outreach (Signed)
  Medicaid Managed Care   Unsuccessful Outreach Note  04/08/2023 Name: Latoya Guzman MRN: [id number] DOB: 07-Aug-1975  Referred by: Shelby Mattocks, DO Reason for referral : No chief complaint on file.   Third unsuccessful telephone outreach was attempted today. The patient was referred to the case management team for assistance with care management and care coordination. The patient's primary care provider has been notified of our unsuccessful attempts to make or maintain contact with the patient. The care management team is pleased to engage with this patient at any time in the future should he/she be interested in assistance from the care management team.   Follow Up Plan: We have been unable to make contact with the patient for follow up. The care management team is available to follow up with the patient after provider conversation with the patient regarding recommendation for care management engagement and subsequent re-referral to the care management team.   Estanislado Emms RN, BSN [place]  [facility] RN Care Coordinator [phone]

## 2023-05-20 ENCOUNTER — Ambulatory Visit (INDEPENDENT_AMBULATORY_CARE_PROVIDER_SITE_OTHER): Payer: Medicaid Other | Admitting: Student

## 2023-05-20 ENCOUNTER — Encounter: Payer: Self-pay | Admitting: Student

## 2023-05-20 ENCOUNTER — Other Ambulatory Visit (HOSPITAL_COMMUNITY)
Admission: RE | Admit: 2023-05-20 | Discharge: 2023-05-20 | Disposition: A | Discharge status: Home / Self Care | Payer: Medicaid Other | Source: Ambulatory Visit | Attending: Family Medicine | Admitting: Family Medicine

## 2023-05-20 VITALS — BP 116/70 | HR 108 | Wt 228.0 lb

## 2023-05-20 DIAGNOSIS — A599 Trichomoniasis, unspecified: Secondary | ICD-10-CM | POA: Insufficient documentation

## 2023-05-20 DIAGNOSIS — N898 Other specified noninflammatory disorders of vagina: Secondary | ICD-10-CM | POA: Insufficient documentation

## 2023-05-20 DIAGNOSIS — Z23 Encounter for immunization: Secondary | ICD-10-CM | POA: Insufficient documentation

## 2023-05-20 LAB — POCT WET PREP (WET MOUNT)
Clue Cells Wet Prep Whiff POC: POSITIVE
WBC, Wet Prep HPF POC: >20

## 2023-05-20 MED ORDER — METRONIDAZOLE 500 MG PO TABS: 500.0000 mg | ORAL_TABLET | Freq: Two times a day (BID) | ORAL | 0 refills | Status: DC

## 2023-05-20 NOTE — Patient Instructions (Signed)
It was great seeing you today.  As we discussed, -You have trichomonas.  I sent a 7-day course of metronidazole to your pharmacy.  You should complete the entire course. -Please return to [place] 4 weeks after you complete the antibiotics and we can make sure the infection is gone -If you are going to be sexually active, you should use condoms always.  Do not have sexual intercourse until 1 week after you complete your antibiotic.  If you have any questions or concerns, please feel free to call the clinic.   Have a wonderful day,  Dr. Darral Dash [facility] [phone]

## 2023-05-20 NOTE — Assessment & Plan Note (Addendum)
Point-of-care wet prep positive for trichomonas. Pending cervicovaginal testing for gonorrhea/chlamydia, will follow-up on these results. Prescribed metronidazole 500 mg twice daily for 7 days.  Encouraged to abstain from sexual intercourse until 1 week post completion of antibiotic. She should return in 4 weeks after completion of antibiotic for test of cure. Patient denies any recent sexual contacts, but I did tell her if she has had any than they should be informed of her diagnosis so that they may receive treatment as well. Of note, this is her third infection with trichomonas, and we did discuss safe sexual practices to help prevent STIs.

## 2023-05-20 NOTE — Assessment & Plan Note (Signed)
Received annual influenza vaccination today, tolerated well.

## 2023-05-20 NOTE — Progress Notes (Signed)
    SUBJECTIVE:   CHIEF COMPLAINT / HPI:   Latoya Guzman is a 48 year old female here with vaginal discharge described as gray, thick for about 8 months  She was diagnosed with trichomonas vaginitis in May 2024. She completed 7-day course of metronidazole.  Says that her discharge never fully went away, and she has been having vaginal irritation, discharge for the past few months.  Denies any sexual activity.  Says she has not been sexually active with her partner prior to trichomonas diagnosis  Took Fluconazole to see if it would help, but discharge is still there. She has to wear panty liner because of excessive discharge.  LMP: Beginning of January 2025 Contraception: Abstinence  PERTINENT  PMH / PSH: History of trichomonas vaginitis 8 months ago IDA  OBJECTIVE:   BP 116/70   Pulse (!) 108   Wt 228 lb (103.4 kg)   LMP 05/02/2023   SpO2 98%   BMI 31.80 kg/m   General: NAD, well appearing Respiratory: Normal effort on room air GU: [name], CMA present during exam.  External vulva, without any obvious lesions or rash. Yellowish tinted discharge appreciated.  Erythema throughout vaginal canal.  Normal ruggae of vaginal walls. Cervix is non erythematous and non-friable.  Tenderness with exam. Extremities: Moving all 4 extremities equally    ASSESSMENT/PLAN:   Influenza vaccination given Received annual influenza vaccination today, tolerated well.  Trichomonas infection Point-of-care wet prep positive for trichomonas. Pending cervicovaginal testing for gonorrhea/chlamydia, will follow-up on these results. Prescribed metronidazole 500 mg twice daily for 7 days.  Encouraged to abstain from sexual intercourse until 1 week post completion of antibiotic. She should return in 4 weeks after completion of antibiotic for test of cure. Patient denies any recent sexual contacts, but I did tell her if she has had any than they should be informed of her diagnosis so that they  may receive treatment as well. Of note, this is her third infection with trichomonas, and we did discuss safe sexual practices to help prevent STIs.     Darral Dash, DO [facility]

## 2023-05-23 LAB — CERVICOVAGINAL ANCILLARY ONLY
Chlamydia: NEGATIVE
Comment: NEGATIVE
Comment: NORMAL
Neisseria Gonorrhea: NEGATIVE

## 2023-05-27 ENCOUNTER — Ambulatory Visit: Payer: Medicaid Other | Admitting: Student

## 2023-05-28 ENCOUNTER — Encounter: Payer: Self-pay | Admitting: Student

## 2023-06-09 ENCOUNTER — Ambulatory Visit: Payer: Medicaid Other | Admitting: Student

## 2023-06-14 ENCOUNTER — Ambulatory Visit (INDEPENDENT_AMBULATORY_CARE_PROVIDER_SITE_OTHER): Payer: Medicaid Other | Admitting: Student

## 2023-06-14 ENCOUNTER — Other Ambulatory Visit (HOSPITAL_COMMUNITY)
Admission: RE | Admit: 2023-06-14 | Discharge: 2023-06-14 | Disposition: A | Discharge status: Home / Self Care | Payer: Medicaid Other | Source: Ambulatory Visit | Attending: Family Medicine | Admitting: Family Medicine

## 2023-06-14 DIAGNOSIS — A5901 Trichomonal vulvovaginitis: Secondary | ICD-10-CM | POA: Diagnosis present

## 2023-06-14 LAB — POCT WET PREP (WET MOUNT): Clue Cells Wet Prep Whiff POC: POSITIVE

## 2023-06-14 NOTE — Progress Notes (Addendum)
    SUBJECTIVE:   CHIEF COMPLAINT / HPI:   Latoya Guzman is a 48 year-old female here for f/u from appointment 05/20/2023 in which she was diagnosed with trichomonas.  Here for TOC today.  Completed Flagyl 500 mg BID x7 days. Had a little bit of nausea. Denies any vaginal odor, discharge, irritation. No new partners.  PERTINENT  PMH / PSH:  Trichomonas Migraine without aura  OBJECTIVE:   LMP 05/02/2023   General: NAD, well appearing Respiratory: normal WOB on RA.  Extremities: Moving all 4 extremities equally GU: External vulva and vagina nonerythematous, without any obvious lesions or rash. Yellowish tinted discharge appreciated.  Normal ruggae of vaginal walls. Cervix is non erythematous and non-friable.    ASSESSMENT/PLAN:   Trichomonas vaginitis Positive for trichomonas again today. Initially positive for trichomonas in 2019, test of cure was negative thereafter. Recently, positive on wet prep for trichomonas in May 2024, January 2025 and today despite Flagyl therapy. Patient adamantly denies any partner since May 2024 Referral to infectious disease for susceptibility testing/possibly resistant trichomonas to routine therapy with Flagyl Discussed abstaining from intercourse entirely until infection is completely treated Awaiting cervicovaginal swab for gonorrhea/chlamydia  Darral Dash, DO [facility]

## 2023-06-14 NOTE — Patient Instructions (Addendum)
It was great to see you! Thank you for allowing me to participate in your care!  We are checking some testing today, I will call you if they are abnormal will send you a MyChart message or a letter if they are normal.   Take care and seek immediate care sooner if you develop any concerns.   Dr. Darral Dash, DO [facility]

## 2023-06-14 NOTE — Assessment & Plan Note (Addendum)
Positive for trichomonas again today. Initially positive for trichomonas in 2019, test of cure was negative thereafter. Recently, positive on wet prep for trichomonas in May 2024, January 2025 and today despite Flagyl therapy. Patient adamantly denies any partner since May 2024 Referral to infectious disease for susceptibility testing/possibly resistant trichomonas to routine therapy with Flagyl Discussed abstaining from intercourse entirely until infection is completely treated

## 2023-06-15 LAB — CERVICOVAGINAL ANCILLARY ONLY
Bacterial Vaginitis (gardnerella): POSITIVE — AB
Chlamydia: NEGATIVE
Comment: NEGATIVE
Comment: NEGATIVE
Comment: NEGATIVE
Comment: NORMAL
Neisseria Gonorrhea: NEGATIVE
Trichomonas: POSITIVE — AB

## 2023-07-14 ENCOUNTER — Other Ambulatory Visit (HOSPITAL_COMMUNITY): Payer: Self-pay

## 2023-07-14 ENCOUNTER — Ambulatory Visit (INDEPENDENT_AMBULATORY_CARE_PROVIDER_SITE_OTHER): Payer: Medicaid Other | Admitting: Infectious Diseases

## 2023-07-14 ENCOUNTER — Telehealth: Payer: Self-pay

## 2023-07-14 ENCOUNTER — Encounter: Payer: Self-pay | Admitting: Infectious Diseases

## 2023-07-14 ENCOUNTER — Other Ambulatory Visit: Payer: Self-pay

## 2023-07-14 VITALS — BP 113/75 | HR 91 | Temp 98.2°F | Wt 223.0 lb

## 2023-07-14 DIAGNOSIS — N760 Acute vaginitis: Secondary | ICD-10-CM | POA: Diagnosis not present

## 2023-07-14 DIAGNOSIS — Z113 Encounter for screening for infections with a predominantly sexual mode of transmission: Secondary | ICD-10-CM

## 2023-07-14 DIAGNOSIS — B9689 Other specified bacterial agents as the cause of diseases classified elsewhere: Secondary | ICD-10-CM

## 2023-07-14 DIAGNOSIS — A5901 Trichomonal vulvovaginitis: Secondary | ICD-10-CM | POA: Diagnosis not present

## 2023-07-14 MED ORDER — TINIDAZOLE 500 MG PO TABS: 2.0000 g | ORAL_TABLET | Freq: Every day | ORAL | 0 refills | Status: AC

## 2023-07-14 NOTE — Telephone Encounter (Signed)
 Submitted a Prior Authorization request to [facility] for [employer] via CoverMyMeds. Will update once we receive a response.    PA ID: [id number]
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
## 2023-07-15 ENCOUNTER — Telehealth: Payer: Self-pay

## 2023-07-15 ENCOUNTER — Other Ambulatory Visit (HOSPITAL_COMMUNITY): Payer: Self-pay

## 2023-07-15 DIAGNOSIS — B9689 Other specified bacterial agents as the cause of diseases classified elsewhere: Secondary | ICD-10-CM | POA: Insufficient documentation

## 2023-07-15 DIAGNOSIS — Z113 Encounter for screening for infections with a predominantly sexual mode of transmission: Secondary | ICD-10-CM | POA: Insufficient documentation

## 2023-07-15 LAB — HEPATITIS B SURFACE ANTIGEN: Hepatitis B Surface Ag: NONREACTIVE

## 2023-07-15 LAB — HIV ANTIBODY (ROUTINE TESTING W REFLEX): HIV 1&2 Ab, 4th Generation: NONREACTIVE

## 2023-07-15 LAB — HEPATITIS C ANTIBODY: Hepatitis C Ab: NONREACTIVE

## 2023-07-15 LAB — RPR: RPR Ser Ql: NONREACTIVE

## 2023-07-15 NOTE — Progress Notes (Signed)
 Reviewed and agree with Dr Macky Lower plan.

## 2023-07-15 NOTE — Telephone Encounter (Signed)
 Received notification from [facility] regarding a prior authorization for Tinidazole. Authorization has been APPROVED from 07/14/23 to 07/13/24.   Per test claim, copay for 7 days supply is $4.00.  Authorization #  [id number] Phone # [phone]

## 2023-07-18 ENCOUNTER — Telehealth: Payer: Self-pay

## 2023-07-18 NOTE — Telephone Encounter (Signed)
-----   Message from Victoriano Lain sent at 07/18/2023  1:15 PM EDT ----- Please let her know that all STD screen is negative. Her current appt is on 4/8. I would like to see her on fu at least 3 to 4 weeks after her last dose of tinidazole. So not before 4/24 and preferably on Monday/Tuesday as planning to send some labs to [facility]. Wilkie Aye is aware about the plan.

## 2023-07-18 NOTE — Telephone Encounter (Signed)
 Attempted to contact patient - no voicemail set up to leave a message. Will try again later.    Xhaiden Coombs Lesli Albee, CMA

## 2023-07-19 NOTE — Telephone Encounter (Signed)
 Patient aware. Patient scheduled for follow up on 08/29/2023.

## 2023-08-02 ENCOUNTER — Ambulatory Visit: Admitting: Infectious Diseases

## 2023-08-29 ENCOUNTER — Ambulatory Visit: Admitting: Infectious Diseases

## 2023-10-03 ENCOUNTER — Ambulatory Visit: Admitting: Infectious Diseases

## 2023-10-04 ENCOUNTER — Encounter: Payer: Self-pay | Admitting: *Deleted

## 2023-10-13 ENCOUNTER — Ambulatory Visit: Admitting: Family

## 2023-12-28 ENCOUNTER — Telehealth: Payer: Self-pay

## 2023-12-28 NOTE — Telephone Encounter (Signed)
 Shannyn LVM to schedule an appt. I returned the call but the call could not be completed. I double checked the number but no luck and no recent MyChart activity.

## 2024-01-31 ENCOUNTER — Other Ambulatory Visit (HOSPITAL_COMMUNITY)
Admission: RE | Admit: 2024-01-31 | Discharge: 2024-01-31 | Disposition: A | Discharge status: Home / Self Care | Source: Ambulatory Visit | Attending: Infectious Diseases | Admitting: Infectious Diseases

## 2024-01-31 ENCOUNTER — Other Ambulatory Visit: Payer: Self-pay

## 2024-01-31 ENCOUNTER — Encounter: Payer: Self-pay | Admitting: Infectious Diseases

## 2024-01-31 ENCOUNTER — Ambulatory Visit: Admitting: Infectious Diseases

## 2024-01-31 VITALS — BP 128/79 | HR 65 | Temp 98.2°F | Resp 16 | Wt 222.0 lb

## 2024-01-31 DIAGNOSIS — A5901 Trichomonal vulvovaginitis: Secondary | ICD-10-CM

## 2024-01-31 DIAGNOSIS — R350 Frequency of micturition: Secondary | ICD-10-CM | POA: Diagnosis not present

## 2024-01-31 DIAGNOSIS — N898 Other specified noninflammatory disorders of vagina: Secondary | ICD-10-CM
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
## 2024-02-01 LAB — URINALYSIS W MICROSCOPIC + REFLEX CULTURE
Bacteria, UA: NONE SEEN /HPF
Bilirubin Urine: NEGATIVE
Glucose, UA: NEGATIVE
Hgb urine dipstick: NEGATIVE
Hyaline Cast: NONE SEEN /LPF
Ketones, ur: NEGATIVE
Leukocyte Esterase: NEGATIVE
Nitrites, Initial: NEGATIVE
Protein, ur: NEGATIVE
RBC / HPF: NONE SEEN /HPF (ref 0–2)
Specific Gravity, Urine: 1.017 (ref 1.001–1.035)
WBC, UA: NONE SEEN /HPF (ref 0–5)
pH: 6.5 (ref 5.0–8.0)

## 2024-02-01 LAB — CERVICOVAGINAL ANCILLARY ONLY
Bacterial Vaginitis (gardnerella): POSITIVE — AB
Candida Glabrata: NEGATIVE
Candida Vaginitis: POSITIVE — AB
Chlamydia: NEGATIVE
Comment: NEGATIVE
Comment: NEGATIVE
Comment: NEGATIVE
Comment: NEGATIVE
Comment: NEGATIVE
Comment: NORMAL
Neisseria Gonorrhea: NEGATIVE
Trichomonas: POSITIVE — AB

## 2024-02-01 LAB — NO CULTURE INDICATED

## 2024-02-03 ENCOUNTER — Ambulatory Visit: Payer: Self-pay | Admitting: Infectious Diseases

## 2024-02-03 ENCOUNTER — Other Ambulatory Visit: Payer: Self-pay | Admitting: Infectious Diseases

## 2024-02-03 DIAGNOSIS — A5901 Trichomonal vulvovaginitis: Secondary | ICD-10-CM

## 2024-02-03 MED ORDER — FLUCONAZOLE 150 MG PO TABS: 150.0000 mg | ORAL_TABLET | Freq: Once | ORAL | 0 refills | Status: AC

## 2024-02-03 MED ORDER — METRONIDAZOLE 500 MG PO TABS: 500.0000 mg | ORAL_TABLET | Freq: Two times a day (BID) | ORAL | 0 refills | Status: AC

## 2024-02-03 NOTE — Progress Notes (Signed)
 Patient made aware of results, she did have concerns of possibility of this medication working as she has taking this in the past with no positive results. Staff did make patient aware that possible reinfection or reoccurrence of Trichomonas, candida and gardnerella is possible even after appropriate treatment. Partner testing and treatment and abstinence encouraged during treatment.   Patient confirmed pharmacy listed in epic:  [id number] [place], [place] - [address] AT [facility] [address] [facility] [address] Phone: [phone]  Fax: [phone]

## 2024-04-17 ENCOUNTER — Ambulatory Visit: Admitting: Gastroenterology
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
## 2024-05-23 ENCOUNTER — Emergency Department (HOSPITAL_COMMUNITY)
Admission: EM | Admit: 2024-05-23 | Discharge: 2024-05-23 | Disposition: A | Discharge status: Home / Self Care | Attending: Emergency Medicine | Admitting: Emergency Medicine

## 2024-05-23 ENCOUNTER — Emergency Department (HOSPITAL_COMMUNITY)

## 2024-05-23 ENCOUNTER — Other Ambulatory Visit: Payer: Self-pay

## 2024-05-23 ENCOUNTER — Encounter (HOSPITAL_COMMUNITY): Payer: Self-pay | Admitting: Emergency Medicine

## 2024-05-23 DIAGNOSIS — F458 Other somatoform disorders: Secondary | ICD-10-CM | POA: Diagnosis not present

## 2024-05-23 DIAGNOSIS — R200 Anesthesia of skin: Secondary | ICD-10-CM | POA: Diagnosis present

## 2024-05-23 DIAGNOSIS — Z91040 Latex allergy status: Secondary | ICD-10-CM | POA: Diagnosis not present

## 2024-05-23 DIAGNOSIS — R519 Headache, unspecified: Secondary | ICD-10-CM

## 2024-05-23 DIAGNOSIS — R079 Chest pain, unspecified: Secondary | ICD-10-CM

## 2024-05-23 DIAGNOSIS — H538 Other visual disturbances: Secondary | ICD-10-CM | POA: Diagnosis not present

## 2024-05-23 LAB — CBC
HCT: 30.1 % — ABNORMAL LOW (ref 36.0–46.0)
Hemoglobin: 8.9 g/dL — ABNORMAL LOW (ref 12.0–15.0)
MCH: 16.8 pg — ABNORMAL LOW (ref 26.0–34.0)
MCHC: 29.6 g/dL — ABNORMAL LOW (ref 30.0–36.0)
MCV: 56.8 fL — ABNORMAL LOW (ref 80.0–100.0)
Platelets: 306 10*3/uL (ref 150–400)
RBC: 5.3 MIL/uL — ABNORMAL HIGH (ref 3.87–5.11)
RDW: 20 % — ABNORMAL HIGH (ref 11.5–15.5)
WBC: 4.5 10*3/uL (ref 4.0–10.5)
nRBC: 0 % (ref 0.0–0.2)

## 2024-05-23 LAB — COMPREHENSIVE METABOLIC PANEL WITH GFR
ALT: 19 U/L (ref 0–44)
AST: 29 U/L (ref 15–41)
Albumin: 4.6 g/dL (ref 3.5–5.0)
Alkaline Phosphatase: 74 U/L (ref 38–126)
Anion gap: 17 — ABNORMAL HIGH (ref 5–15)
BUN: 14 mg/dL (ref 6–20)
CO2: 17 mmol/L — ABNORMAL LOW (ref 22–32)
Calcium: 9.9 mg/dL (ref 8.9–10.3)
Chloride: 103 mmol/L (ref 98–111)
Creatinine, Ser: 1.15 mg/dL — ABNORMAL HIGH (ref 0.44–1.00)
GFR, Estimated: 59 mL/min — ABNORMAL LOW
Glucose, Bld: 89 mg/dL (ref 70–99)
Potassium: 3.7 mmol/L (ref 3.5–5.1)
Sodium: 136 mmol/L (ref 135–145)
Total Bilirubin: 0.7 mg/dL (ref 0.0–1.2)
Total Protein: 8.1 g/dL (ref 6.5–8.1)

## 2024-05-23 LAB — I-STAT CHEM 8, ED
BUN: 14 mg/dL (ref 6–20)
Calcium, Ion: 1.11 mmol/L — ABNORMAL LOW (ref 1.15–1.40)
Chloride: 107 mmol/L (ref 98–111)
Creatinine, Ser: 1.1 mg/dL — ABNORMAL HIGH (ref 0.44–1.00)
Glucose, Bld: 92 mg/dL (ref 70–99)
HCT: 32 % — ABNORMAL LOW (ref 36.0–46.0)
Hemoglobin: 10.9 g/dL — ABNORMAL LOW (ref 12.0–15.0)
Potassium: 3.6 mmol/L (ref 3.5–5.1)
Sodium: 139 mmol/L (ref 135–145)
TCO2: 18 mmol/L — ABNORMAL LOW (ref 22–32)

## 2024-05-23 LAB — DIFFERENTIAL
Abs Immature Granulocytes: 0 10*3/uL (ref 0.00–0.07)
Basophils Absolute: 0 10*3/uL (ref 0.0–0.1)
Basophils Relative: 1 %
Eosinophils Absolute: 0 10*3/uL (ref 0.0–0.5)
Eosinophils Relative: 0 %
Immature Granulocytes: 0 %
Lymphocytes Relative: 34 %
Lymphs Abs: 1.5 10*3/uL (ref 0.7–4.0)
Monocytes Absolute: 0.6 10*3/uL (ref 0.1–1.0)
Monocytes Relative: 13 %
Neutro Abs: 2.3 10*3/uL (ref 1.7–7.7)
Neutrophils Relative %: 52 %

## 2024-05-23 LAB — URINE DRUG SCREEN
Amphetamines: NEGATIVE
Barbiturates: NEGATIVE
Benzodiazepines: NEGATIVE
Cocaine: NEGATIVE
Fentanyl: NEGATIVE
Methadone Scn, Ur: NEGATIVE
Opiates: NEGATIVE
Tetrahydrocannabinol: NEGATIVE

## 2024-05-23 LAB — APTT: aPTT: 32 s (ref 24–36)

## 2024-05-23 LAB — PROTIME-INR
INR: 1 (ref 0.8–1.2)
Prothrombin Time: 13.8 s (ref 11.4–15.2)

## 2024-05-23 LAB — CBG MONITORING, ED: Glucose-Capillary: 97 mg/dL (ref 70–99)

## 2024-05-23 LAB — ETHANOL: Alcohol, Ethyl (B): <15 mg/dL

## 2024-05-23 MED ORDER — LORAZEPAM 2 MG/ML IJ SOLN
INTRAMUSCULAR | Status: AC
  Filled 2024-05-23: qty 1

## 2024-05-23 MED ORDER — SODIUM CHLORIDE 0.9 % IV BOLUS
1000.0000 mL | Freq: Once | INTRAVENOUS | Status: AC
  Administered 2024-05-23: 1000 mL via INTRAVENOUS

## 2024-05-23 MED ORDER — ONDANSETRON HCL 4 MG/2ML IJ SOLN
4.0000 mg | Freq: Once | INTRAMUSCULAR | Status: AC
  Administered 2024-05-23: 4 mg via INTRAVENOUS
  Filled 2024-05-23: qty 2

## 2024-05-23 MED ORDER — SODIUM CHLORIDE 0.9% FLUSH
3.0000 mL | Freq: Once | INTRAVENOUS | Status: AC
  Administered 2024-05-23: 3 mL via INTRAVENOUS

## 2024-05-23 MED ORDER — LORAZEPAM 2 MG/ML IJ SOLN
1.0000 mg | Freq: Once | INTRAMUSCULAR | Status: AC
  Administered 2024-05-23: 1 mg via INTRAVENOUS

## 2024-05-23 NOTE — ED Triage Notes (Signed)
 Pt arrived via [facility] as a Code Stroke r/t sudden onset of r-sided weakness (specifically on the face, arm, and leg). Pt started to panic after she noticed the symptoms and called EMS. She reported decreased sensation on the right face and arm as well. Pt has a hx of lupus and a previous syncopal episode that brought her to the hospital. Pt states she has issues with blood clots due to her lupus.

## 2024-05-23 NOTE — Discharge Instructions (Signed)
 Return for any problem.  ?

## 2024-05-23 NOTE — Code Documentation (Signed)
 Stroke Response Nurse Documentation Code Documentation  Latoya Guzman is a 49 y.o. female arriving to [facility]  via [facility] on 1/28 with past medical hx of migraine, lupus. On No antithrombotic. Code stroke was activated by EMS.   Patient from [facility] where she was LKW at 1130 and now complaining of R sided weakness, headache, and R sided tingling.   Stroke team at the bedside on patient arrival. Labs drawn and patient cleared for CT by Dr. Laurice. Patient to CT with team. NIHSS 1, see documentation for details and code stroke times. Patient with bilateral decreased sensation (R side for face and arm, L side for leg) on exam. The following imaging was completed:  CT Head. Patient is not a candidate for IV Thrombolytic due to TMTT. Patient is not a candidate for IR due to LVO not suspected.   Care Plan: q30 NIHSS and vitals until 1600, then q2. NPO until swallow screen   Bedside handoff with ED RN Madline.    Lauraine LITTIE Searle  Stroke Response RN
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
## 2024-05-23 NOTE — ED Provider Notes (Signed)
 " [facility] [facility] Provider Note   CSN: [id number] Arrival date & time: 05/23/24  1253  An emergency department physician performed an initial assessment on this suspected stroke patient at 1254.  Patient presents with: Code Stroke   Latoya Guzman is a 49 y.o. female.   49 year old female with prior medical history as detailed below presents for evaluation.  Patient was at work.  She called EMS when she began to have sudden weakness and chest discomfort and tingling.  She was called as a code stroke by EMS.  Neurology saw the patient initially and patient was taken directly to CT imaging.  Patient does have a history of lupus.  Neurology has completed their initial evaluation.  Patient does not meet criteria for lytics.  Neurology is requesting MRI to further evaluate her symptoms.  At the time of my evaluation the patient is neurologically intact.  She is complaining of perioral paresthesia and tingling numbness in her distal hands.  She is experiencing mild cramping of her fingers and hands as well.  She is hyperventilating during the evaluation.  The history is provided by the patient and medical records.       Prior to Admission medications  Medication Sig Start Date End Date Taking? Authorizing Provider  albuterol  (VENTOLIN  HFA) 108 (90 Base) MCG/ACT inhaler Inhale 2 puffs into the lungs every 4 (four) hours as needed for wheezing or shortness of breath. Patient not taking: Reported on 01/31/2024 05/21/21   Iva Marty Saltness, MD  Apple Cider Vinegar 250 MG CHEW Chew by mouth.    [provider]  LINZESS  290 MCG CAPS capsule Take 290 mcg by mouth daily as needed (For IBS). Patient not taking: Reported on 01/31/2024 01/28/21   [provider]  omeprazole  (PRILOSEC) 40 MG capsule Take 40 mg by mouth daily. Patient not taking: Reported on 01/31/2024 05/21/21   [provider]  phentermine (ADIPEX-P) 37.5 MG tablet  Take 37.5 mg by mouth daily. Patient not taking: Reported on 01/31/2024    [provider]  WEGOVY 0.25 MG/0.5ML SOAJ SMARTSIG:0.25 Milligram(s) SUB-Q Once a Week Patient not taking: Reported on 01/31/2024 06/22/23   [provider]    Allergies: Sumatriptan , Methocarbamol, Latex, and Morphine  and codeine     Review of Systems  All other systems reviewed and are negative.   Updated Vital Signs Pulse 91   Temp 98.3 F (36.8 C)   Resp 20   Wt 102.6 kg   SpO2 100%   BMI 31.55 kg/m   Physical Exam Vitals and nursing note reviewed.  Constitutional:      General: She is not in acute distress.    Appearance: She is well-developed.  HENT:     Head: Normocephalic and atraumatic.  Eyes:     Conjunctiva/sclera: Conjunctivae normal.  Cardiovascular:     Rate and Rhythm: Normal rate and regular rhythm.     Heart sounds: No murmur heard. Pulmonary:     Effort: Pulmonary effort is normal. No respiratory distress.     Breath sounds: Normal breath sounds.  Abdominal:     Palpations: Abdomen is soft.     Tenderness: There is no abdominal tenderness.  Musculoskeletal:        General: No swelling.     Cervical back: Neck supple.  Skin:    General: Skin is warm and dry.     Capillary Refill: Capillary refill takes less than 2 seconds.  Neurological:  General: No focal deficit present.     Mental Status: She is alert and oriented to person, place, and time. Mental status is at baseline.     Cranial Nerves: No cranial nerve deficit.     Motor: No weakness.     Comments: Aox4, Anxious, Hyperventilating, reports perioral and distal bilateral upper extremity tingling.  She appears to have some mild carpopedal muscular spasm as well.  This is consistent with hyperventilation syndrome.  Otherwise patient is grossly intact neurologically.  Psychiatric:        Mood and Affect: Mood normal.     (all labs ordered are listed, but only abnormal results are displayed) Labs  Reviewed  CBC - Abnormal; Notable for the following components:      Result Value   RBC 5.30 (*)    Hemoglobin 8.9 (*)    HCT 30.1 (*)    MCV 56.8 (*)    MCH 16.8 (*)    MCHC 29.6 (*)    RDW 20.0 (*)    All other components within normal limits  I-STAT CHEM 8, ED - Abnormal; Notable for the following components:   Creatinine, Ser 1.10 (*)    Calcium, Ion 1.11 (*)    TCO2 18 (*)    Hemoglobin 10.9 (*)    HCT 32.0 (*)    All other components within normal limits  PROTIME-INR  APTT  DIFFERENTIAL  COMPREHENSIVE METABOLIC PANEL WITH GFR  ETHANOL  HCG, SERUM, QUALITATIVE  URINE DRUG SCREEN  CBG MONITORING, ED    EKG: None  Radiology: CT HEAD CODE STROKE WO CONTRAST Result Date: 05/23/2024 EXAM: CT HEAD WITHOUT CONTRAST 05/23/2024 01:05:48 PM TECHNIQUE: CT of the head was performed without the administration of intravenous contrast. Automated exposure control, iterative reconstruction, and/or weight based adjustment of the mA/kV was utilized to reduce the radiation dose to as low as reasonably achievable. COMPARISON: CT venogram head and MRI head 07/11/2021. CLINICAL HISTORY: Neurologic deficit, acute, stroke suspected. Slurred speech, right sided weakness and numbness. FINDINGS: BRAIN AND VENTRICLES: There is no evidence of an acute infarct, intracranial hemorrhage, mass, midline shift, hydrocephalus, or extra-axial fluid collection. Cerebral volume is normal. ORBITS: No acute abnormality. SINUSES: Minimal mucosal thickening in the paranasal sinuses. Clear mastoid air cells. SOFT TISSUES AND SKULL: No acute soft tissue abnormality. No skull fracture. ASPECTS: [place] Stroke Program Early CT Score (ASPECTS) ----- Ganglionic (caudate, IC, lentiform nucleus, insula, M1-M3): 7 Supraganglionic (M4-M6): 3 Total: 10 IMPRESSION: 1. No acute intracranial abnormality. ASPECTS of 10. 2. These results were communicated to Dr. CHARLENA Shark at 1:13 PM on 05/23/2024 by secure text page via the [facility]  messaging system. Electronically signed by: Dasie Hamburg MD 05/23/2024 01:14 PM EST RP Workstation: [id number]     Procedures   Medications Ordered in the ED  sodium chloride  flush (NS) 0.9 % injection 3 mL (has no administration in time range)  sodium chloride  0.9 % bolus 1,000 mL (has no administration in time range)  LORazepam  (ATIVAN ) injection 1 mg (has no administration in time range)  ondansetron  (ZOFRAN ) injection 4 mg (has no administration in time range)                                    Medical Decision Making Patient arrived with diffuse weakness.   Code Stroke initiated in the field by EMS. Neuro team evaluated on arrival. No indication for acute neuro intervention.   Neuro  team requested MRI to RO CVA. MRI normal.   Patient's symptoms most consistent with hyperventilation syndrome.   On reevaluation, patient is asymptomatic. She is appropriate for DC home. Strict return precautions given and understood. Importance of close FU stressed.   Amount and/or Complexity of Data Reviewed Labs: ordered. Radiology: ordered.  Risk Prescription drug management.        Final diagnoses:  Numbness  Hyperventilation syndrome    ED Discharge Orders     None          Laurice Maude BROCKS, MD 05/25/24 1422  "

## 2024-05-23 NOTE — ED Notes (Signed)
 Pt to MRI

## 2024-05-25 ENCOUNTER — Ambulatory Visit: Admitting: Family Medicine

## 2024-05-25 ENCOUNTER — Encounter: Payer: Self-pay | Admitting: Family Medicine

## 2024-05-25 VITALS — BP 110/70 | HR 72 | Ht 71.0 in | Wt 220.0 lb

## 2024-05-25 DIAGNOSIS — Z5941 Food insecurity: Secondary | ICD-10-CM | POA: Diagnosis not present

## 2024-05-25 DIAGNOSIS — G8929 Other chronic pain: Secondary | ICD-10-CM

## 2024-05-25 DIAGNOSIS — D509 Iron deficiency anemia, unspecified: Secondary | ICD-10-CM

## 2024-05-25 DIAGNOSIS — F430 Acute stress reaction: Secondary | ICD-10-CM

## 2024-05-25 DIAGNOSIS — M545 Low back pain, unspecified: Secondary | ICD-10-CM

## 2024-05-25 DIAGNOSIS — R079 Chest pain, unspecified: Secondary | ICD-10-CM

## 2024-05-25 MED ORDER — LINZESS 290 MCG PO CAPS: 290.0000 ug | ORAL_CAPSULE | Freq: Every day | ORAL | Status: AC | PRN

## 2024-05-25 MED ORDER — CYCLOBENZAPRINE HCL 5 MG PO TABS: 5.0000 mg | ORAL_TABLET | Freq: Three times a day (TID) | ORAL | 1 refills | Status: DC | PRN

## 2024-05-25 NOTE — Assessment & Plan Note (Signed)
 Referral back to Hematology per patient request for IV iron  Intolerant of PO iron   Discussed etiology Still has menses Reports colonoscopy in 2025 but cannot find records--was incomplete due to poor prep per patient Referral back to GI given age
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
## 2024-05-28 ENCOUNTER — Telehealth: Payer: Self-pay

## 2024-05-28 ENCOUNTER — Telehealth: Payer: Self-pay | Admitting: *Deleted

## 2024-05-28 NOTE — Progress Notes (Signed)
 Complex Care Management Note  Care Guide Note 05/28/2024 Name: SNEHA WILLIG MRN: [id number] DOB: [birth year]  Shelba JONETTA Molt is a 49 y.o. year old female who sees Alena Morrison, Elio, MD for primary care. I reached out to Shelba JONETTA Molt by phone today to offer complex care management services.  Ms. Huot was given information about Complex Care Management services today including:   The Complex Care Management services include support from the care team which includes your Nurse Care Manager, Clinical Social Worker, or Pharmacist.  The Complex Care Management team is here to help remove barriers to the health concerns and goals most important to you. Complex Care Management services are voluntary, and the patient may decline or stop services at any time by request to their care team member.   Complex Care Management Consent Status: Patient agreed to services and verbal consent obtained.   Follow up plan:  Telephone appointment with complex care management team member scheduled for:  06/13/24 at 9:30 a.m.   Encounter Outcome:  Patient Scheduled  D[facility]  [facility], [facility] VBCI Assistant Direct Dial: [phone]  Fax: [phone]

## 2024-05-28 NOTE — Progress Notes (Signed)
 Complex Care Management Note Care Guide Note  05/28/2024 Name: Latoya Guzman MRN: [id number] DOB: 1976/01/11   Complex Care Management Outreach Attempts: An unsuccessful telephone outreach was attempted today to offer the patient information about available complex care management services.  Follow Up Plan:  Additional outreach attempts will be made to offer the patient complex care management information and services.   Encounter Outcome:  No Answer  [facility] Care Guide  Direct [phone] Fax:[phone] Website: [email or web address]

## 2024-05-29 ENCOUNTER — Telehealth: Payer: Self-pay | Admitting: *Deleted

## 2024-05-30 ENCOUNTER — Telehealth: Payer: Self-pay | Admitting: *Deleted

## 2024-05-30 ENCOUNTER — Ambulatory Visit: Payer: Self-pay | Admitting: Family Medicine

## 2024-05-30 ENCOUNTER — Ambulatory Visit (HOSPITAL_COMMUNITY)
Admission: RE | Admit: 2024-05-30 | Discharge: 2024-05-30 | Attending: Cardiovascular Disease | Admitting: Cardiovascular Disease

## 2024-05-30 DIAGNOSIS — R079 Chest pain, unspecified: Secondary | ICD-10-CM | POA: Diagnosis not present

## 2024-05-30 LAB — ECHOCARDIOGRAM COMPLETE
Area-P 1/2: 3.15 cm2
S' Lateral: 2.96 cm

## 2024-05-30 NOTE — Progress Notes (Signed)
 Complex Care Management Note Care Guide Note  05/30/2024 Name: Latoya Guzman MRN: [id number] DOB: 1975-12-26   Complex Care Management Outreach Attempts: An unsuccessful telephone outreach was attempted today to offer the patient information about available complex care management services.  Follow Up Plan:  No further outreach attempts will be made at this time. We have been unable to contact the patient to offer or enroll patient in complex care management services.  Encounter Outcome:  No Answer  [facility] Care Guide  Direct Dial:[phone] [phone] Website: [email or web address]

## 2024-05-31 ENCOUNTER — Ambulatory Visit: Payer: Self-pay
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
## 2024-06-01 ENCOUNTER — Other Ambulatory Visit (HOSPITAL_BASED_OUTPATIENT_CLINIC_OR_DEPARTMENT_OTHER): Payer: Self-pay

## 2024-06-01 ENCOUNTER — Ambulatory Visit: Admitting: Physician Assistant

## 2024-06-01 ENCOUNTER — Encounter: Payer: Self-pay | Admitting: Physician Assistant

## 2024-06-01 VITALS — BP 128/72 | HR 90 | Ht 71.0 in | Wt 220.0 lb

## 2024-06-01 DIAGNOSIS — D500 Iron deficiency anemia secondary to blood loss (chronic): Secondary | ICD-10-CM

## 2024-06-01 DIAGNOSIS — R600 Localized edema: Secondary | ICD-10-CM

## 2024-06-01 DIAGNOSIS — G43009 Migraine without aura, not intractable, without status migrainosus: Secondary | ICD-10-CM

## 2024-06-01 DIAGNOSIS — R072 Precordial pain: Secondary | ICD-10-CM

## 2024-06-01 MED ORDER — METOPROLOL TARTRATE 25 MG PO TABS
25.0000 mg | ORAL_TABLET | Freq: Once | ORAL | 0 refills | Status: AC
  Filled 2024-06-01 (×3): qty 1, 1d supply, fill #0

## 2024-06-01 NOTE — Patient Instructions (Addendum)
 Medication Instructions:  Your physician recommends that you continue on your current medications as directed. Please refer to the Current Medication list given to you today.  *If you need a refill on your cardiac medications before your next appointment, please call your pharmacy*  Lab Work: None ordered  If you have labs (blood work) drawn today and your tests are completely normal, you will receive your results only by: MyChart Message (if you have MyChart) OR A paper copy in the mail If you have any lab test that is abnormal or we need to change your treatment, we will call you to review the results.  Testing/Procedures: Your physician has requested that you have cardiac CT. Cardiac computed tomography (CT) is a painless test that uses an x-ray machine to take clear, detailed pictures of your heart. For further information please visit [email or web address] Please follow instructions BELOW:    Your cardiac CT is scheduled at :     [facility] [address]  [place], [place] [address]   2/19 3pm arriving at 2:30    If scheduled at the Heart and Vascular Tower at [facility] street, please enter the parking lot using the [address] entrance and use the FREE valet service at the patient drop-off area. Enter the building and check-in with registration on the main floor.   Please follow these instructions carefully (unless otherwise directed):  An IV will be required for this test and Nitroglycerin  will be given.    On the Night Before the Test: Be sure to Drink plenty of water. Do not consume any caffeinated/decaffeinated beverages or chocolate 12 hours prior to your test. Do not take any antihistamines 12 hours prior to your test.   On the Day of the Test: Drink plenty of water until 1 hour prior to the test. Do not eat any food 1 hour prior to test. You may take your regular medications prior to the test.  Take metoprolol  100 MG   (Lopressor ) two hours prior to test. THIS HAS BEEN SENT TO OUR PHARMACY ON THE 1ST FLOOR  FEMALES- please wear underwire-free bra if available, avoid dresses & tight clothing       After the Test: Drink plenty of water. After receiving IV contrast, you may experience a mild flushed feeling. This is normal. On occasion, you may experience a mild rash up to 24 hours after the test. This is not dangerous. If this occurs, you can take Benadryl  25 mg, Zyrtec , Claritin, or Allegra  and increase your fluid intake. (Patients taking Tikosyn should avoid Benadryl , and may take Zyrtec , Claritin, or Allegra ) If you experience trouble breathing, this can be serious. If it is severe call 911 IMMEDIATELY. If it is mild, please call our office.  We will call to schedule your test 2-4 weeks out understanding that some insurance companies will need an authorization prior to the service being performed.   For more information and frequently asked questions, please visit our website : [email or web address]  For non-scheduling related questions, please contact the cardiac imaging nurse navigator should you have any questions/concerns: Cardiac Imaging Nurse Navigators Direct Office Dial: [phone]   For scheduling needs, including cancellations and rescheduling, please call Brittany, [phone].  For billing questions, please call [phone].    Follow-Up: At [facility], you and your health needs are our priority.  As part of our continuing mission to provide you with exceptional heart care, our providers are all part of one team.  This team  includes your primary Cardiologist (physician) and Advanced Practice Providers or APPs (Physician Assistants and Nurse Practitioners) who all work together to provide you with the care you need, when you need it.  Your next appointment:   4 week(s)  Provider:   Emeline Calender, MD   We recommend signing up for the patient portal called  MyChart.  Sign up information is provided on this After Visit Summary.  MyChart is used to connect with patients for Virtual Visits (Telemedicine).  Patients are able to view lab/test results, encounter notes, upcoming appointments, etc.  Non-urgent messages can be sent to your provider as well.   To learn more about what you can do with MyChart, go to [email or web address].   Other Instructions

## 2024-06-13 ENCOUNTER — Telehealth: Admitting: Licensed Clinical Social Worker

## 2024-06-14 ENCOUNTER — Ambulatory Visit (HOSPITAL_COMMUNITY)

## 2024-07-10 ENCOUNTER — Ambulatory Visit: Admitting: Internal Medicine
